# Patient Record
Sex: Male | Born: 1955 | Race: Black or African American | Hispanic: No | Smoking: Former smoker
Health system: Southern US, Community
[De-identification: ages and names within clinical notes are randomized; demographics above are authoritative.]

## PROBLEM LIST (undated history)

## (undated) DIAGNOSIS — R7611 Nonspecific reaction to tuberculin skin test without active tuberculosis: Secondary | ICD-10-CM

## (undated) DIAGNOSIS — R52 Pain, unspecified: Secondary | ICD-10-CM

## (undated) DIAGNOSIS — Z6833 Body mass index (BMI) 33.0-33.9, adult: Secondary | ICD-10-CM

## (undated) DIAGNOSIS — E119 Type 2 diabetes mellitus without complications: Secondary | ICD-10-CM

## (undated) DIAGNOSIS — G4730 Sleep apnea, unspecified: Secondary | ICD-10-CM

## (undated) DIAGNOSIS — M549 Dorsalgia, unspecified: Secondary | ICD-10-CM

## (undated) DIAGNOSIS — J189 Pneumonia, unspecified organism: Secondary | ICD-10-CM

## (undated) DIAGNOSIS — E785 Hyperlipidemia, unspecified: Secondary | ICD-10-CM

## (undated) DIAGNOSIS — R0989 Other specified symptoms and signs involving the circulatory and respiratory systems: Secondary | ICD-10-CM

## (undated) DIAGNOSIS — J309 Allergic rhinitis, unspecified: Secondary | ICD-10-CM

## (undated) DIAGNOSIS — I10 Essential (primary) hypertension: Secondary | ICD-10-CM

## (undated) DIAGNOSIS — F17200 Nicotine dependence, unspecified, uncomplicated: Secondary | ICD-10-CM

## (undated) DIAGNOSIS — K219 Gastro-esophageal reflux disease without esophagitis: Secondary | ICD-10-CM

## (undated) DIAGNOSIS — R0609 Other forms of dyspnea: Secondary | ICD-10-CM

## (undated) DIAGNOSIS — R799 Abnormal finding of blood chemistry, unspecified: Secondary | ICD-10-CM

## (undated) DIAGNOSIS — R7989 Other specified abnormal findings of blood chemistry: Secondary | ICD-10-CM

## (undated) DIAGNOSIS — R12 Heartburn: Secondary | ICD-10-CM

## (undated) DIAGNOSIS — R030 Elevated blood-pressure reading, without diagnosis of hypertension: Secondary | ICD-10-CM

## (undated) DIAGNOSIS — G4700 Insomnia, unspecified: Secondary | ICD-10-CM

## (undated) DIAGNOSIS — M25569 Pain in unspecified knee: Secondary | ICD-10-CM

## (undated) HISTORY — DX: Allergic rhinitis, unspecified: J30.9

## (undated) HISTORY — DX: Pain, unspecified: R52

## (undated) HISTORY — DX: Gastro-esophageal reflux disease without esophagitis: K21.9

## (undated) HISTORY — DX: Insomnia, unspecified: G47.00

## (undated) HISTORY — DX: Essential (primary) hypertension: I10

## (undated) HISTORY — DX: Nonspecific reaction to tuberculin skin test without active tuberculosis: R76.11

## (undated) HISTORY — DX: Elevated blood-pressure reading, without diagnosis of hypertension: R03.0

## (undated) HISTORY — DX: Other specified abnormal findings of blood chemistry: R79.89

## (undated) HISTORY — DX: Heartburn: R12

## (undated) HISTORY — DX: Nicotine dependence, unspecified, uncomplicated: F17.200

## (undated) HISTORY — DX: Body mass index (BMI) 33.0-33.9, adult: Z68.33

## (undated) HISTORY — DX: Dorsalgia, unspecified: M54.9

## (undated) HISTORY — DX: Abnormal finding of blood chemistry, unspecified: R79.9

## (undated) HISTORY — PX: ORIF FOREARM FRACTURE: SHX2124

## (undated) HISTORY — DX: Other forms of dyspnea: R06.09

## (undated) HISTORY — DX: Other forms of dyspnea: R09.89

## (undated) HISTORY — PX: OTHER SURGICAL HISTORY: SHX169

## (undated) HISTORY — DX: Pain in unspecified knee: M25.569

## (undated) HISTORY — DX: Hyperlipidemia, unspecified: E78.5

---

## 2012-07-26 ENCOUNTER — Encounter: Payer: Self-pay | Admitting: Gastroenterology

## 2012-07-30 ENCOUNTER — Ambulatory Visit: Payer: Self-pay | Admitting: Gastroenterology

## 2012-09-04 ENCOUNTER — Encounter: Payer: Self-pay | Admitting: Gastroenterology

## 2012-09-04 ENCOUNTER — Ambulatory Visit (INDEPENDENT_AMBULATORY_CARE_PROVIDER_SITE_OTHER): Payer: Medicare HMO | Admitting: Gastroenterology

## 2012-09-04 VITALS — BP 140/74 | HR 84 | Ht 67.0 in | Wt 213.0 lb

## 2012-09-04 DIAGNOSIS — K219 Gastro-esophageal reflux disease without esophagitis: Secondary | ICD-10-CM

## 2012-09-04 NOTE — Progress Notes (Signed)
HPI: This is a    very pleasant 57 year old man whom I am meeting for the first time today.  Has had GERD like troubles for about 10 years.  Wihtin the past 6 weeks he wakes with water brash, regurg.  Overall gaining wieight.  PIll associated dysphagia but no food associated dysphagia.    Takes protonix usually in evening, shortly after dinner meal.  Lays down for sleep at 11pm.  He snacks after dinner usually.    Tried alka seltzer and this has helped, usually takes it PM at bedtime.  Feels much better with alkaseltzer.  BMs twice a day.  Will have beer on weekends.  Smokes cigar's daily.  Rare caffeine drinker.  He had colonoscopy in [place] in 2008 or 2009, this was normal.  He does not recall who did it but he believes family member remembers the name of the hospital  Review of systems: Pertinent positive and negative review of systems were noted in the above HPI section. Complete review of systems was performed and was otherwise normal.    Past Medical History  Diagnosis Date  . Backache, unspecified   . Unspecified essential hypertension   . Esophageal reflux   . Pain in joint, lower leg   . Generalized pain   . Other and unspecified hyperlipidemia   . Tobacco use disorder   . Body mass index 33.0-33.9, adult   . Other nonspecific findings on examination of blood   . Other abnormal blood chemistry   . Other dyspnea and respiratory abnormality   . Allergic rhinitis, cause unspecified   . Elevated blood pressure reading without diagnosis of hypertension   . 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 Insomnia, unspecified   . Heartburn     Past Surgical History  Procedure Date  . Orif forearm fracture     right    Current Outpatient Prescriptions  Medication Sig Dispense Refill  . amLODipine (NORVASC) 10 MG tablet Take 10 mg by mouth daily.      [name] atorvastatin (LIPITOR) 20 MG tablet Take 20 mg by mouth daily.      . cyclobenzaprine  (FLEXERIL) 10 MG tablet Take 10 mg by mouth at bedtime.      [name] oxyCODONE-acetaminophen (PERCOCET) 7.5-325 MG per tablet Take 1 tablet by mouth every 8 (eight) hours as needed.      . pantoprazole (PROTONIX) 40 MG tablet Take 40 mg by mouth daily.      [name] zolpidem (AMBIEN) 10 MG tablet Take 10 mg by mouth at bedtime as needed.        Allergies as of 09/04/2012 - Review Complete 09/04/2012  Allergen Reaction Noted  . Tuberculin tests  09/04/2012    Family History  Problem Relation Age of Onset  . Diabetes Mother   . Hypertension Mother   . Cancer Paternal Uncle     History   Social History  . Marital Status: Legally Separated    Spouse Name: N/A    Number of Children: 1  . Years of Education: N/A   Occupational History  . Not on file.   Social History Main Topics  . Smoking status: Current Every Day Smoker -- 0.2 packs/day for 1 years    Types: Cigars  . Smokeless tobacco: Never Used  . Alcohol Use: Yes     Comment: 2 40oz beers a week  . Drug Use: No  . Sexually Active: Not on file   Other Topics Concern  .  Not on file   Social History Narrative  . No narrative on file       Physical Exam: BP 140/74  Pulse 84  Ht 5\' 7"  (1.702 m)  Wt 213 lb (96.616 kg)  BMI 33.36 kg/m2 Constitutional: generally well-appearing Psychiatric: alert and oriented x3 Eyes: extraocular movements intact Mouth: oral pharynx moist, no lesions Neck: supple no lymphadenopathy Cardiovascular: heart regular rate and rhythm Lungs: clear to auscultation bilaterally Abdomen: soft, nontender, nondistended, no obvious ascites, no peritoneal signs, normal bowel sounds Extremities: no lower extremity edema bilaterally Skin: no lesions on visible extremities    Assessment and plan: 57 y.o. male with  GERD, intermittent pill associated dysphasia, likely routine risk for colon cancer  I recommended he cut down late-night snacks. He will just the way he is taking his proton pump inhibitor and  he will add H2 blocker at bedtime. We'll proceed with EGD to evaluate his chronic GERD, his intermittent mild dysphasia. We will try to get records from his [place] colonoscopy and I will make a recommendation for timing of his next, routine screening colonoscopy.

## 2012-09-04 NOTE — Patient Instructions (Addendum)
We will track down records from [place] colonoscopy in 2008, 2009. You should change the way you are taking your antiacid medicine (protonix) so that you are taking it 20-30 minutes prior to a decent meal as that is the way the pill is designed to work most effectively. Try to cut down on late night snacking. Trial of OTC pepcid or zantac (or generic equivalent) at bedtime every night. You will be set up for an upper endoscopy (LEC, moderate sedation) for GERD, dysphagia.

## 2012-09-06 ENCOUNTER — Telehealth: Payer: Self-pay | Admitting: Gastroenterology

## 2012-09-06 NOTE — Telephone Encounter (Signed)
Colonoscopy Dr. Merilynn Finland, [place], 10/2011.  Done for screening.  Findings normal examination.  Recommended repeat colonoscopy in 10 years.  I agree with that.  Patty, He needs recall colonoscopy 10/2016. Can you call him to let him know we got those records. Thanks

## 2012-09-06 NOTE — Telephone Encounter (Signed)
Left message on machine to call back  

## 2012-09-07 ENCOUNTER — Ambulatory Visit (AMBULATORY_SURGERY_CENTER): Payer: Medicare HMO | Admitting: Gastroenterology

## 2012-09-07 ENCOUNTER — Encounter: Payer: Self-pay | Admitting: Gastroenterology

## 2012-09-07 VITALS — BP 160/97 | HR 71 | Temp 97.5°F | Resp 26 | Ht 67.0 in | Wt 213.0 lb

## 2012-09-07 DIAGNOSIS — K2210 Ulcer of esophagus without bleeding: Secondary | ICD-10-CM

## 2012-09-07 DIAGNOSIS — K219 Gastro-esophageal reflux disease without esophagitis: Secondary | ICD-10-CM

## 2012-09-07 MED ORDER — SODIUM CHLORIDE 0.9 % IV SOLN: 500.0000 mL | INTRAVENOUS | Status: DC

## 2012-09-07 NOTE — Progress Notes (Signed)
The pt tolerated the egd well. Maw  

## 2012-09-07 NOTE — Telephone Encounter (Signed)
Pt aware and recall added

## 2012-09-07 NOTE — Op Note (Signed)
[facility] [address] [place], [address]   ENDOSCOPY PROCEDURE REPORT  PATIENT: [name], [name]  MR#: [id number] BIRTHDATE: 1956-04-25 , 56  yrs. old GENDER: Male ENDOSCOPIST: Rachael Fee, MD REFERRED BY:  Tracey Harries, M.D. PROCEDURE DATE:  09/07/2012 PROCEDURE:  EGD, diagnostic ASA CLASS:     Class II INDICATIONS:  GERD, intermittent dysphagia. MEDICATIONS: Fentanyl 50 mcg IV, Versed 6 mg IV, and These medications were titrated to patient response per physician's verbal order TOPICAL ANESTHETIC: Cetacaine Spray  DESCRIPTION OF PROCEDURE: After the risks benefits and alternatives of the procedure were thoroughly explained, informed consent was obtained.  The LB [id number]-H180 [id number] endoscope was introduced through the mouth and advanced to the second portion of the duodenum. Without limitations.  The instrument was slowly withdrawn as the mucosa was fully examined.    There was 1-2cm tongues of erosive esophagitis.  There was no stricture or ring.  The examination was otherwise normal. Retroflexed views revealed no abnormalities.     The scope was then withdrawn from the patient and the procedure completed. COMPLICATIONS: There were no complications. ENDOSCOPIC IMPRESSION: Erosive, reflux related esophagitis  RECOMMENDATIONS: You should continue taking PPI once daily (20-30 min before a meal) and H2 blocker such as pepcid or zantac at bedtime.   eSigned:  Rachael Fee, MD 09/07/2012 1:45 PM

## 2012-09-07 NOTE — Patient Instructions (Addendum)
YOU HAD AN ENDOSCOPIC PROCEDURE TODAY AT THE [facility]: Refer to the procedure report that was given to you for any specific questions about what was found during the examination.  If the procedure report does not answer your questions, please call your gastroenterologist to clarify.  If you requested that your care partner not be given the details of your procedure findings, then the procedure report has been included in a sealed envelope for you to review at your convenience later.  YOU SHOULD EXPECT: Some feelings of bloating in the abdomen. Passage of more gas than usual.  Walking can help get rid of the air that was put into your GI tract during the procedure and reduce the bloating. If you had a lower endoscopy (such as a colonoscopy or flexible sigmoidoscopy) you may notice spotting of blood in your stool or on the toilet paper. If you underwent a bowel prep for your procedure, then you may not have a normal bowel movement for a few days.  DIET: Your first meal following the procedure should be a light meal and then it is ok to progress to your normal diet.  A half-sandwich or bowl of soup is an example of a good first meal.  Heavy or fried foods are harder to digest and may make you feel nauseous or bloated.  Likewise meals heavy in dairy and vegetables can cause extra gas to form and this can also increase the bloating.  Drink plenty of fluids but you should avoid alcoholic beverages for 24 hours.  ACTIVITY: Your care partner should take you home directly after the procedure.  You should plan to take it easy, moving slowly for the rest of the day.  You can resume normal activity the day after the procedure however you should NOT DRIVE or use heavy machinery for 24 hours (because of the sedation medicines used during the test).    SYMPTOMS TO REPORT IMMEDIATELY: A gastroenterologist can be reached at any hour.  During normal business hours, 8:30 AM to 5:00 PM Monday through Friday,  call [phone].  After hours and on weekends, please call the GI answering service at [phone] who will take a message and have the physician on call contact you.     Following upper endoscopy (EGD)  Vomiting of blood or coffee ground material  New chest pain or pain under the shoulder blades  Painful or persistently difficult swallowing  New shortness of breath  Fever of 100F or higher  Black, tarry-looking stools  FOLLOW UP: If any biopsies were taken you will be contacted by phone or by letter within the next 1-3 weeks.  Call your gastroenterologist if you have not heard about the biopsies in 3 weeks.  Our staff will call the home number listed on your records the next business day following your procedure to check on you and address any questions or concerns that you may have at that time regarding the information given to you following your procedure. This is a courtesy call and so if there is no answer at the home number and we have not heard from you through the emergency physician on call, we will assume that you have returned to your regular daily activities without incident.  SIGNATURES/CONFIDENTIALITY: You and/or your care partner have signed paperwork which will be entered into your electronic medical record.  These signatures attest to the fact that  that the information above on your After Visit Summary has been reviewed and is understood.  Full responsibility of the confidentiality of this discharge information lies with you and/or your care-partner   Continue anti-reflux medication 30 minutes before breakfast and pepcid or zantac one at bedtime

## 2012-09-07 NOTE — Progress Notes (Signed)
Patient did not experience any of the following events: a burn prior to discharge; a fall within the facility; wrong site/side/patient/procedure/implant event; or a hospital transfer or hospital admission upon discharge from the facility. (G8907) Patient did not have preoperative order for IV antibiotic SSI prophylaxis. (G8918)  

## 2012-09-10 ENCOUNTER — Telehealth: Payer: Self-pay | Admitting: *Deleted

## 2012-09-10 NOTE — Telephone Encounter (Signed)
  Follow up Call-  Call back number 09/07/2012  Post procedure Call Back phone  # [phone]  Permission to leave phone message Yes     Patient questions:  Do you have a fever, pain , or abdominal swelling? no Pain Score  0 *  Have you tolerated food without any problems? yes  Have you been able to return to your normal activities? yes  Do you have any questions about your discharge instructions: Diet   no Medications  no Follow up visit  no  Do you have questions or concerns about your Care? no  Actions: * If pain score is 4 or above: No action needed, pain <4.

## 2015-03-05 ENCOUNTER — Ambulatory Visit
Admission: RE | Admit: 2015-03-05 | Discharge: 2015-03-05 | Disposition: A | Discharge status: Home / Self Care | Payer: Medicare HMO | Source: Ambulatory Visit | Attending: Neurology | Admitting: Neurology

## 2015-03-05 ENCOUNTER — Other Ambulatory Visit: Payer: Self-pay | Admitting: Neurology

## 2015-03-05 DIAGNOSIS — Z01818 Encounter for other preprocedural examination: Secondary | ICD-10-CM

## 2016-09-05 ENCOUNTER — Encounter: Payer: Self-pay | Admitting: Gastroenterology

## 2017-04-18 ENCOUNTER — Encounter (HOSPITAL_COMMUNITY): Payer: Self-pay | Admitting: Emergency Medicine

## 2017-04-18 ENCOUNTER — Emergency Department (HOSPITAL_COMMUNITY): Payer: Medicare HMO

## 2017-04-18 ENCOUNTER — Emergency Department (HOSPITAL_COMMUNITY)
Admission: EM | Admit: 2017-04-18 | Discharge: 2017-04-18 | Disposition: A | Discharge status: Home / Self Care | Payer: Medicare HMO | Attending: Emergency Medicine | Admitting: Emergency Medicine

## 2017-04-18 DIAGNOSIS — Z79899 Other long term (current) drug therapy: Secondary | ICD-10-CM | POA: Diagnosis not present

## 2017-04-18 DIAGNOSIS — F17290 Nicotine dependence, other tobacco product, uncomplicated: Secondary | ICD-10-CM | POA: Diagnosis not present

## 2017-04-18 DIAGNOSIS — M25532 Pain in left wrist: Secondary | ICD-10-CM | POA: Diagnosis present

## 2017-04-18 DIAGNOSIS — I10 Essential (primary) hypertension: Secondary | ICD-10-CM | POA: Insufficient documentation

## 2017-04-18 DIAGNOSIS — M25832 Other specified joint disorders, left wrist: Secondary | ICD-10-CM

## 2017-04-18 MED ORDER — KETOROLAC TROMETHAMINE 60 MG/2ML IM SOLN
30.0000 mg | Freq: Once | INTRAMUSCULAR | Status: DC
  Filled 2017-04-18: qty 2

## 2017-04-18 MED ORDER — DICLOFENAC SODIUM 50 MG PO TBEC: 50.0000 mg | DELAYED_RELEASE_TABLET | Freq: Two times a day (BID) | ORAL | 0 refills | Status: DC

## 2017-04-18 NOTE — ED Triage Notes (Signed)
Pt c/o left hand pain, numbness, decreased ROM onset 2 days ago, was moving a car, heavy lifting 3 days ago. Improved when patient applies pressure to wrist. Worse laying down. Numbness reproducible with percussion to anterior wrist. Hx carpal tunnel release surgery a few years ago, states current pain feels similar to carpal tunnel syndrome.

## 2017-04-18 NOTE — Discharge Instructions (Signed)
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 When you were pushing against the car to try and move it you most likely irritated the area to cause the increased pain. The splint should help with the pain. Elevation and ice will also help. Take your pain medication at home in addition to what we give you. Follow up with Dr. Laurie Panda and he may want to refer you to a hand surgeon.

## 2017-04-18 NOTE — ED Provider Notes (Signed)
[facility] DEPT Provider Note   CSN: [id number] Arrival date & time: 04/18/17  6301     History   Chief Complaint Chief Complaint  Patient presents with  . Hand Pain    HPI [name] [name] [name] [name] is a 61 y.o. male who presents to the ED with hand pain. The pain is located in the left wrist. Patient reports that he was helping his brother push a car 3 days ago and after that the pain started. He reports taking Percocet that he has for his back pain but it did not help the wrist pain.  The history is provided by the patient. No language interpreter was used.  Hand Pain  This is a new problem. The current episode started 2 days ago. The problem occurs constantly. The problem has been gradually worsening. Pertinent negatives include no chest pain, no headaches and no shortness of breath.    Past Medical History:  Diagnosis Date  . Allergic rhinitis, cause unspecified   . Backache, unspecified   . Body mass index 33.0-33.9, adult   . Elevated blood pressure reading without diagnosis of hypertension   . Esophageal reflux   . Generalized pain   . Heartburn   . Insomnia, unspecified   . 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 Other abnormal blood chemistry   . Other and unspecified hyperlipidemia   . Other dyspnea and respiratory abnormality   . Other nonspecific findings on examination of blood(790.99)   . Pain in joint, lower leg   . Tobacco use disorder   . Unspecified essential hypertension     There are no active problems to display for this patient.   Past Surgical History:  Procedure Laterality Date  . bil wrist surgery    . ORIF FOREARM FRACTURE     right  . plastic surgery to face         Home Medications    Prior to Admission medications   Medication Sig Start Date End Date Taking? Authorizing Provider  amLODipine (NORVASC) 10 MG tablet Take 10 mg by mouth daily.    [provider]  atorvastatin (LIPITOR)  20 MG tablet Take 20 mg by mouth daily.    [provider]  cyclobenzaprine (FLEXERIL) 10 MG tablet Take 10 mg by mouth at bedtime.    [provider]  diclofenac (VOLTAREN) 50 MG EC tablet Take 1 tablet (50 mg total) by mouth 2 (two) times daily. 04/18/17   Ashley Murrain, NP  oxyCODONE-acetaminophen (PERCOCET) 7.5-325 MG per tablet Take 1 tablet by mouth every 8 (eight) hours as needed.    [provider]  pantoprazole (PROTONIX) 40 MG tablet Take 40 mg by mouth daily.    [provider]  zolpidem (AMBIEN) 10 MG tablet Take 10 mg by mouth at bedtime as needed.    [provider]    Family History Family History  Problem Relation Age of Onset  . Diabetes Mother   . Hypertension Mother   . Cancer Paternal Uncle     Social History Social History  Substance Use Topics  . Smoking status: Current Every Day Smoker    Packs/day: 0.20    Years: 1.00    Types: Cigars  . Smokeless tobacco: Never Used  . Alcohol use Yes     Comment: 2 40oz beers a week     Allergies   Tuberculin tests   Review of Systems Review of Systems  Constitutional: Negative for chills  and fever.  HENT: Negative.   Eyes: Negative for visual disturbance.  Respiratory: Negative for cough and shortness of breath.   Cardiovascular: Negative for chest pain.  Gastrointestinal: Negative for nausea and vomiting.  Musculoskeletal: Positive for arthralgias. Back pain: chronic low back pain.       Right wrist pain  Skin: Negative for wound.  Neurological: Negative for syncope and headaches.  Psychiatric/Behavioral: Negative for confusion. The patient is not nervous/anxious.      Physical Exam Updated Vital Signs BP (!) 142/84 (BP Location: Left Arm)   Pulse 80   Temp 98.6 F (37 C) (Oral)   Resp 18   SpO2 98%   Physical Exam  Constitutional: He is oriented to person, place, and time. He appears well-developed and well-nourished. No distress.  HENT:  Head:  Normocephalic and atraumatic.  Eyes: EOM are normal.  Neck: Neck supple.  Cardiovascular: Normal rate.   Pulmonary/Chest: Effort normal.  Abdominal: Soft. There is no tenderness.  Musculoskeletal:       Right wrist: He exhibits no swelling, no deformity and no laceration. Decreased range of motion: due to pain.  Neurological: He is alert and oriented to person, place, and time. No cranial nerve deficit.  Skin: Skin is warm and dry.  Psychiatric: He has a normal mood and affect. His behavior is normal.  Nursing note and vitals reviewed.    ED Treatments / Results  Labs (all labs ordered are listed, but only abnormal results are displayed) Labs Reviewed - No data to display  Radiology No results found.  Procedures Procedures (including critical care time)  Medications Ordered in ED Medications - No data to display   Initial Impression / Assessment and Plan / ED Course  I have reviewed the triage vital signs and the nursing notes.  Pertinent imaging results that were available during my care of the patient were reviewed by me and considered in my medical decision making (see chart for details). 61 y.o. male with left wrist pain stable for d/c without focal neuro deficits. Will treat with wrist splint and NSAIDS. Patient to f/u with his PCP. Return precautions discussed.   Final Clinical Impressions(s) / ED Diagnoses   Final diagnoses:  Ulnar impaction syndrome, left    New Prescriptions Discharge Medication List as of 04/18/2017  1:00 PM    START taking these medications   Details  diclofenac (VOLTAREN) 50 MG EC tablet Take 1 tablet (50 mg total) by mouth 2 (two) times daily., Starting Tue 04/18/2017, Print         [place], [place], NP 04/20/17 [place]    Quintella Reichert, MD 04/21/17 1208

## 2017-10-12 ENCOUNTER — Other Ambulatory Visit (HOSPITAL_COMMUNITY): Payer: Self-pay | Admitting: Family Medicine

## 2017-10-12 DIAGNOSIS — R590 Localized enlarged lymph nodes: Secondary | ICD-10-CM

## 2017-10-13 ENCOUNTER — Ambulatory Visit
Admission: RE | Admit: 2017-10-13 | Discharge: 2017-10-13 | Disposition: A | Discharge status: Home / Self Care | Payer: Self-pay | Source: Ambulatory Visit | Attending: Family Medicine | Admitting: Family Medicine

## 2017-10-13 ENCOUNTER — Other Ambulatory Visit (HOSPITAL_COMMUNITY): Payer: Self-pay | Admitting: Family Medicine

## 2017-10-13 DIAGNOSIS — R590 Localized enlarged lymph nodes: Secondary | ICD-10-CM

## 2017-10-17 ENCOUNTER — Other Ambulatory Visit (HOSPITAL_COMMUNITY): Payer: Self-pay | Admitting: Family Medicine

## 2017-10-17 DIAGNOSIS — R590 Localized enlarged lymph nodes: Secondary | ICD-10-CM

## 2017-10-25 ENCOUNTER — Other Ambulatory Visit: Payer: Self-pay | Admitting: Student

## 2017-10-26 ENCOUNTER — Other Ambulatory Visit (HOSPITAL_COMMUNITY): Payer: Self-pay | Admitting: Family Medicine

## 2017-10-26 ENCOUNTER — Ambulatory Visit (HOSPITAL_COMMUNITY)
Admission: RE | Admit: 2017-10-26 | Discharge: 2017-10-26 | Disposition: A | Discharge status: Home / Self Care | Payer: Medicare HMO | Source: Ambulatory Visit | Attending: Family Medicine | Admitting: Family Medicine

## 2017-10-26 ENCOUNTER — Encounter (HOSPITAL_COMMUNITY): Payer: Self-pay

## 2017-10-26 ENCOUNTER — Ambulatory Visit (HOSPITAL_COMMUNITY): Payer: Medicare HMO

## 2017-10-26 DIAGNOSIS — Z79899 Other long term (current) drug therapy: Secondary | ICD-10-CM | POA: Diagnosis not present

## 2017-10-26 DIAGNOSIS — F17210 Nicotine dependence, cigarettes, uncomplicated: Secondary | ICD-10-CM | POA: Insufficient documentation

## 2017-10-26 DIAGNOSIS — I10 Essential (primary) hypertension: Secondary | ICD-10-CM | POA: Insufficient documentation

## 2017-10-26 DIAGNOSIS — E785 Hyperlipidemia, unspecified: Secondary | ICD-10-CM | POA: Diagnosis not present

## 2017-10-26 DIAGNOSIS — R590 Localized enlarged lymph nodes: Secondary | ICD-10-CM

## 2017-10-26 DIAGNOSIS — K219 Gastro-esophageal reflux disease without esophagitis: Secondary | ICD-10-CM | POA: Insufficient documentation

## 2017-10-26 DIAGNOSIS — C8581 Other specified types of non-Hodgkin lymphoma, lymph nodes of head, face, and neck: Secondary | ICD-10-CM | POA: Diagnosis not present

## 2017-10-26 DIAGNOSIS — Z7982 Long term (current) use of aspirin: Secondary | ICD-10-CM | POA: Diagnosis not present

## 2017-10-26 DIAGNOSIS — Z7984 Long term (current) use of oral hypoglycemic drugs: Secondary | ICD-10-CM | POA: Diagnosis not present

## 2017-10-26 LAB — GLUCOSE, CAPILLARY: Glucose-Capillary: 113 mg/dL — ABNORMAL HIGH (ref 65–99)

## 2017-10-26 MED ORDER — LIDOCAINE HCL (PF) 1 % IJ SOLN
INTRAMUSCULAR | Status: AC
  Filled 2017-10-26: qty 10

## 2017-10-26 MED ORDER — LIDOCAINE HCL (PF) 1 % IJ SOLN
INTRAMUSCULAR | Status: AC
  Filled 2017-10-26: qty 30

## 2017-10-26 NOTE — Sedation Documentation (Signed)
Pt tolerated procedure very well without sedation.

## 2017-10-26 NOTE — Sedation Documentation (Signed)
Patient is resting comfortably.Vitals stable, biopsies taken.

## 2017-10-26 NOTE — Sedation Documentation (Signed)
Pt remains stable. Discharged home. Verbalizes understanding of discharge instructions.

## 2017-10-26 NOTE — Sedation Documentation (Signed)
Pt is resting comfortably in the nurses station awaiting transportation.

## 2017-10-26 NOTE — Sedation Documentation (Signed)
Pt is anxious

## 2017-10-26 NOTE — Consult Note (Signed)
Chief Complaint: Patient was seen in consultation today for cervical lymphadenopathy at the request of Bouska,David  Referring Physician(s): Bouska,David  History of Present Illness: [name] [name] is a 63 y.o. male cervical lymphadenopathy.  Patient palpated nodes in neck and outside cross sectional imaging that demonstrated enlarged lymph nodes.  Patient presents for lymph node biopsy.  No complaints today.    Past Medical History:  Diagnosis Date  . Allergic rhinitis, cause unspecified   . Backache, unspecified   . Body mass index 33.0-33.9, adult   . Elevated blood pressure reading without diagnosis of hypertension   . Esophageal reflux   . Generalized pain   . Heartburn   . Insomnia, unspecified   . 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 Other abnormal blood chemistry   . Other and unspecified hyperlipidemia   . Other dyspnea and respiratory abnormality   . Other nonspecific findings on examination of blood(790.99)   . Pain in joint, lower leg   . Tobacco use disorder   . Unspecified essential hypertension     Past Surgical History:  Procedure Laterality Date  . bil wrist surgery    . ORIF FOREARM FRACTURE     right  . plastic surgery to face      Allergies: Lisinopril and Tuberculin tests  Medications: Prior to Admission medications   Medication Sig Start Date End Date Taking? Authorizing Provider  amLODipine (NORVASC) 10 MG tablet Take 10 mg by mouth daily.   Yes [provider]  aspirin EC 81 MG tablet Take 81 mg by mouth daily.   Yes [provider]  atorvastatin (LIPITOR) 20 MG tablet Take 20 mg by mouth daily.   Yes [provider]  cyclobenzaprine (FLEXERIL) 10 MG tablet Take 10 mg by mouth at bedtime.   Yes [provider]  losartan (COZAAR) 50 MG tablet Take 50 mg by mouth daily.   Yes [provider]  meloxicam (MOBIC) 15 MG tablet Take 15 mg by mouth daily.    Yes [provider]  metFORMIN (GLUCOPHAGE) 500 MG tablet Take 500 mg by mouth daily.   Yes [provider]  oxyCODONE-acetaminophen (PERCOCET) 7.5-325 MG per tablet Take 1 tablet by mouth 2 (two) times daily as needed for severe pain.    Yes [provider]  pantoprazole (PROTONIX) 40 MG tablet Take 40 mg by mouth daily.   Yes [provider]  potassium chloride SA (K-DUR,KLOR-CON) 20 MEQ tablet Take 20 mEq by mouth daily.   Yes [provider]  zolpidem (AMBIEN) 10 MG tablet Take 10 mg by mouth at bedtime.    Yes [provider]     Family History  Problem Relation Age of Onset  . Diabetes Mother   . Hypertension Mother   . Cancer Paternal Uncle     Social History   Socioeconomic History  . Marital status: Legally Separated    Spouse name: Not on file  . Number of children: 1  . Years of education: Not on file  . Highest education level: Not on file  Social Needs  . Financial resource strain: Not on file  . Food insecurity - worry: Not on file  . Food insecurity - inability: Not on file  . Transportation needs - medical: Not on file  . Transportation needs - non-medical: Not on file  Occupational History  . Not on file  Tobacco Use  . Smoking status: Current Every Day Smoker  Packs/day: 0.20    Years: 1.00    Pack years: 0.20    Types: Cigars  . Smokeless tobacco: Never Used  Substance and Sexual Activity  . Alcohol use: Yes    Comment: 2 40oz beers a week  . Drug use: No  . Sexual activity: Not on file  Other Topics Concern  . Not on file  Social History Narrative  . Not on file     Review of Systems  Vital Signs: BP 140/89   Pulse 78   Temp 97.7 F (36.5 C)   Resp 12   Ht 5\' 7"  (1.702 m)   Wt 198 lb (89.8 kg)   SpO2 95% Comment: RA  BMI 31.01 kg/m   Physical Exam  Constitutional: He appears well-developed and well-nourished. No distress.  HENT:  Mouth/Throat: Oropharynx is clear and moist.    Cardiovascular: Normal rate, regular rhythm and normal heart sounds.  Pulmonary/Chest: Effort normal and breath sounds normal.  Abdominal: Soft.  Lymphadenopathy:    He has cervical adenopathy.    Imaging: No results found.  Labs:  CBC: No results for input(s): WBC, HGB, HCT, PLT in the last 8760 hours.  COAGS: No results for input(s): INR, APTT in the last 8760 hours.  BMP: No results for input(s): NA, K, CL, CO2, GLUCOSE, BUN, CALCIUM, CREATININE, GFRNONAA, GFRAA in the last 8760 hours.  Invalid input(s): CMP  LIVER FUNCTION TESTS: No results for input(s): BILITOT, AST, ALT, ALKPHOS, PROT, ALBUMIN in the last 8760 hours.  TUMOR MARKERS: No results for input(s): AFPTM, CEA, CA199, CHROMGRNA in the last 8760 hours.  Assessment and Plan:  62 yo with cervical lymphadenopathy and concern for neoplasm and lymphoma.  Discussed US guided biopsy with patient and he elected to do procedure without sedation.  Risks include but not limited to bleeding and infection.  Plan for US guided cervical lymph node biopsy.      Electronically Signed: Burman Riis, MD 10/26/2017, 3:45 PM

## 2017-10-26 NOTE — Sedation Documentation (Signed)
Pt does not request sedation for this procedure. Vitals stable MD at bedside

## 2017-10-26 NOTE — Sedation Documentation (Signed)
Biopsies taken at this time. Pt resting

## 2017-10-26 NOTE — Sedation Documentation (Signed)
Pt is resting with no complaints at this time, procedure started

## 2017-11-09 ENCOUNTER — Telehealth: Payer: Self-pay | Admitting: Hematology

## 2017-11-09 NOTE — Progress Notes (Signed)
HEMATOLOGY/ONCOLOGY CONSULTATION NOTE  Date of Service: 11/10/2017  Patient Care Team: Bernerd Limbo, MD as PCP - General (Family Medicine)  CHIEF COMPLAINTS/PURPOSE OF CONSULTATION:  Small B-Cell Lymphoma  HISTORY OF PRESENTING ILLNESS:   [name] [name] is a wonderful 62 y.o. 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 The pt reports that he is doing well overall.   The pt reports that in August 2018 he felt a knot on the left side of his neck, which he spoke about with his PCP Dr. Coletta Memos. He then had an MRI in January 2019, a CXR in February 2019 and then a biopsy on 10/26/17 (results noted below). He denies any constitutional symptoms as noted below. He notes that the swelling on his neck has not increased in size and is not painful.   He notes that he gets congested at night and is only able to breathe out of one nostril; he denies having a deviated septum. He notes that he has tried to have this worked up for the last 4 years without success and has tried saline sprays without success. He notes that he wakes up with dry mouth.   The pt takes Amlodipine, 81mg  Aspirin, Atorvastatin, Cyclobenzaprine, Ferrousul, Losartan Potassium, Meloxicam, Metformin 500mg , Percocet, Protonix, Potassium Chloride, and Ambein.   Of note prior to the patient's visit today, pt has had Lymph node, needle/core biopsy, Left Supraclavicular completed on 10/31/17 with results revealing SMALL LYMPHOCYTIC LYMPHOMA.  10/26/17 Tissue Flow Cytometry revealed a Monoclonal B Cell population identified.    08/28/17 MRI revealed Extensive cervical adenopathy. Adenopathy also noted in the subpectoral nodes.   CXR on 10/11/17 was revealed to be Normal.   His 10/11/17 CBC revealed all values WNL except for % Lymph at 66.3%, Mono % at 3.3% and % Gran at 30.4%, Lymphs Abs at 6.4k, Hgb at 12.5, HCT at 37.5, MCV at 75.5, MCH at 25.1, RDW at 16.3, MPV at 6.4  On  review of systems, pt reports left cervical lymph node swelling and denies fevers, chills, night sweats, unexpected weight loss, CP, SOB, abdominal pain or swelling, pain along the spine, changes in bowel habits, skin rashes, leg swelling, and any other symptoms.    On PMHx the pt reports Hyperlipidemia, Hypertensive reitonpathy, Hypopotassemia, DM Type 2, acid reflux, spinal stenosis.  On Social Hx the pt reports being a former smoker, having quit in 2012. He notes that he smoked about 5 cigars per week before quitting. He denies consuming much ETOH. He denies any recreational drug use. He denies any unsafe needle exposure.  On Family Hx the pt reports an uncle who had cancer but wasn't sure what kind.   MEDICAL HISTORY:  Past Medical History:  Diagnosis Date  . Allergic rhinitis, cause unspecified   . Backache, unspecified   . Body mass index 33.0-33.9, adult   . Elevated blood pressure reading without diagnosis of hypertension   . Esophageal reflux   . Generalized pain   . Heartburn   . Insomnia, unspecified   . 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 Other abnormal blood chemistry   . Other and unspecified hyperlipidemia   . Other dyspnea and respiratory abnormality   . Other nonspecific findings on examination of blood(790.99)   . Pain in joint, lower leg   . Tobacco use disorder   . Unspecified essential hypertension     SURGICAL HISTORY: Past  Surgical History:  Procedure Laterality Date  . bil wrist surgery    . ORIF FOREARM FRACTURE     right  . plastic surgery to face      SOCIAL HISTORY: Social History   Socioeconomic History  . Marital status: Legally Separated    Spouse name: Not on file  . Number of children: 1  . Years of education: Not on file  . Highest education level: Not on file  Occupational History  . Not on file  Social Needs  . Financial resource strain: Not on file  . Food insecurity:    Worry: Not  on file    Inability: Not on file  . Transportation needs:    Medical: Not on file    Non-medical: Not on file  Tobacco Use  . Smoking status: Current Every Day Smoker    Packs/day: 0.20    Years: 1.00    Pack years: 0.20    Types: Cigars  . Smokeless tobacco: Never Used  Substance and Sexual Activity  . Alcohol use: Yes    Comment: 2 40oz beers a week  . Drug use: No  . Sexual activity: Not on file  Lifestyle  . Physical activity:    Days per week: Not on file    Minutes per session: Not on file  . Stress: Not on file  Relationships  . Social connections:    Talks on phone: Not on file    Gets together: Not on file    Attends religious service: Not on file    Active member of club or organization: Not on file    Attends meetings of clubs or organizations: Not on file    Relationship status: Not on file  . Intimate partner violence:    Fear of current or ex partner: Not on file    Emotionally abused: Not on file    Physically abused: Not on file    Forced sexual activity: Not on file  Other Topics Concern  . Not on file  Social History Narrative  . Not on file    FAMILY HISTORY: Family History  Problem Relation Age of Onset  . Diabetes Mother   . Hypertension Mother   . Cancer Paternal Uncle     ALLERGIES:  is allergic to lisinopril and tuberculin tests.  MEDICATIONS:  Current Outpatient Medications  Medication Sig Dispense Refill  . amLODipine (NORVASC) 10 MG tablet Take 10 mg by mouth daily.    [name] aspirin EC 81 MG tablet Take 81 mg by mouth daily.    [name] atorvastatin (LIPITOR) 20 MG tablet Take 20 mg by mouth daily.    . cyclobenzaprine (FLEXERIL) 10 MG tablet Take 10 mg by mouth at bedtime.    [name] losartan (COZAAR) 50 MG tablet Take 50 mg by mouth daily.    . meloxicam (MOBIC) 15 MG tablet Take 15 mg by mouth daily.    . metFORMIN (GLUCOPHAGE) 500 MG tablet Take 500 mg by mouth daily.    [name] oxyCODONE-acetaminophen (PERCOCET) 7.5-325 MG per tablet Take 1  tablet by mouth 2 (two) times daily as needed for severe pain.     . pantoprazole (PROTONIX) 40 MG tablet Take 40 mg by mouth daily.    . potassium chloride SA (K-DUR,KLOR-CON) 20 MEQ tablet Take 20 mEq by mouth daily.    [name] zolpidem (AMBIEN) 10 MG tablet Take 10 mg by mouth at bedtime.      No current facility-administered medications for this visit.  REVIEW OF SYSTEMS:    10 Point review of Systems was done is negative except as noted above.  PHYSICAL EXAMINATION: ECOG PERFORMANCE STATUS: 1 - Symptomatic but completely ambulatory  . Vitals:   11/10/17 0854  BP: 129/82  Pulse: 74  Resp: 18  Temp: 98.8 F (37.1 C)  SpO2: 99%   Filed Weights   11/10/17 0854  Weight: 202 lb 1.6 oz (91.7 kg)   .Body mass index is 31.65 kg/m.  GENERAL:alert, in no acute distress and comfortable SKIN: no acute rashes, no significant lesions EYES: conjunctiva are pink and non-injected, sclera anicteric OROPHARYNX: MMM, no exudates, no oropharyngeal erythema or ulceration NECK: supple, no JVD LYMPH:  no palpable lymphadenopathy in the axillary or inguinal regions, small palpable cervical Lnadenopathy noted. LUNGS: clear to auscultation b/l with normal respiratory effort HEART: regular rate & rhythm ABDOMEN:  normoactive bowel sounds , non tender, not distended. No palpable hepato-splenomegaly Extremity: no pedal edema PSYCH: alert & oriented x 3 with fluent speech NEURO: no focal motor/sensory deficits  LABORATORY DATA:  I have reviewed the data as listed  . CBC Latest Ref Rng & Units 11/10/2017  WBC 4.0 - 10.3 K/uL 10.8(H)  Hematocrit 38.4 - 49.9 % 35.0(L)  Platelets 140 - 400 K/uL 228  HGB 11.9    . CMP Latest Ref Rng & Units 11/10/2017  Glucose 70 - 140 mg/dL 115  BUN 7 - 26 mg/dL 13  Creatinine 0.70 - 1.30 mg/dL 1.30  Sodium 136 - 145 mmol/L 139  Potassium 3.5 - 5.1 mmol/L 4.1  Chloride 98 - 109 mmol/L 107  CO2 22 - 29 mmol/L 23  Calcium 8.4 - 10.4 mg/dL 9.7  Total  Protein 6.4 - 8.3 g/dL 7.7  Total Bilirubin 0.2 - 1.2 mg/dL 1.0  Alkaline Phos 40 - 150 U/L 108  AST 5 - 34 U/L 10  ALT 0 - 55 U/L 8   Component     Latest Ref Rng & Units 11/10/2017  Retic Ct Pct     0.8 - 1.8 % 1.3  RBC.     4.20 - 5.82 MIL/uL 4.75  Retic Count, Absolute     34.8 - 93.9 K/uL 61.8  LDH     125 - 245 U/L 152  HCV Ab     0.0 - 0.9 s/co ratio <0.1  HIV Screen 4th Generation wRfx     Non Reactive Non Reactive  Hepatitis B Surface Ag     Negative Negative  Hep B Core Ab, Tot     Negative Negative    10/31/17 Tissue Flow Cytometry:   10/31/17 Lymph Node Needle/Core Biopsy:    RADIOGRAPHIC STUDIES: I have personally reviewed the radiological images as listed and agreed with the findings in the report.  08/28/17 MRI     US Soft Tissue Head & Neck (non-thyroid)  Result Date: 10/26/2017 CLINICAL DATA:  Evaluate cervical lymphadenopathy prior to biopsy. EXAM: ULTRASOUND OF HEAD/NECK SOFT TISSUES TECHNIQUE: Ultrasound examination of the head and neck soft tissues was performed in the area of clinical concern. COMPARISON:  None. FINDINGS: Multiple enlarged hypoechoic lymph nodes along the left side of the neck. These lymph nodes are markedly enlarged for size. Index lymph node measures 1.7 cm in the short axis. IMPRESSION: Multiple enlarged left cervical lymph nodes. Electronically Signed   By: Markus Daft M.D.   On: 10/26/2017 17:57   [place] Core Biopsy (lymph Nodes)  Result Date: 10/26/2017 INDICATION: 62 year old with cervical lymphadenopathy. Patient needs a tissue  diagnosis. EXAM: ULTRASOUND-GUIDED LEFT CERVICAL LYMPH NODE BIOPSY MEDICATIONS: None. ANESTHESIA/SEDATION: None FLUOROSCOPY TIME:  None COMPLICATIONS: None immediate. PROCEDURE: Informed written consent was obtained from the patient after a thorough discussion of the procedural risks, benefits and alternatives. All questions were addressed. A timeout was performed prior to the initiation of the procedure. Left  side of the neck was evaluated with ultrasound. Multiple enlarged lymph nodes were identified. The neck was prepped with chlorhexidine and a sterile field was created. Skin and soft tissues were anesthetized with 1% lidocaine. Using ultrasound guidance, a total of 6 core biopsies were obtained within enlarged lymph nodes. Two adjacent lymph nodes were sampled. The smaller lymph node had 1 core biopsy. The larger lymph node had 5 core biopsies. Specimens were placed in saline. Bandage placed over the puncture site. Ultrasound images were taken and saved for this procedure. FINDINGS: Multiple enlarged hypoechoic lymph nodes in the left supraclavicular region. No significant bleeding or hematoma formation following core biopsies. IMPRESSION: Ultrasound-guided left cervical lymph node core biopsies. Electronically Signed   By: Markus Daft M.D.   On: 10/26/2017 17:54   Mr Outside Films Soft Tissue Neck  Result Date: 10/30/2017 This examination belongs to an outside facility and is stored here for comparison purposes only.  Contact the originating outside institution for any associated report or interpretation.   ASSESSMENT & PLAN:   62 y.o. male with  1. 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 RBCs and Platelets were normal in number.  -Discussed his 10/26/17 biopsy which revealed a Small B-Cell Non-Hodgkin's Lymphocytic Lymphoma.  -Discussed that we would want to characterize this finding completely with a PET/CT scan.  -Discussed that he might have CLL/SLL as well based on lymphocytosis detected in his blood. We will work this up further with peripheral blood flow tests.  -Discussed the constitutional symptoms that the pt should look out for incuding fevers, chills, drenching night sweats, significant fatigue, unexpected and significant weight loss. He notes none of these currently -We  would like to perform genetic testing to understand what kind of mutation may be present. (CLLFISH panel) -Discussed that after the initial work up and testing if no indication for treatment then we would like to see pt routinely every 2-3 months. -Provided supplemental information to the pt regardign CLL/SLL, and asked him to let us know if he develops any new or concerning symptoms.  -Just palpable bilateral cervical lymph nodes were noted. No palpable axillary lymph nodes. Borderline splenomegaly.    Labs today PET/CT in 1 week RTC with Dr Irene Limbo in 2 weeks   All of the patients questions were answered with apparent satisfaction. The patient knows to call the clinic with any problems, questions or concerns.  I spent 45 minutes counseling the patient face to face. The total time spent in the appointment was 60 minutes and more than 50% was on counseling and direct patient cares.    Sullivan Lone MD [place] AAHIVMS [facility] Hematology/Oncology Physician [facility]  (Office):       [phone] (Work cell):  [phone] (Fax):           [phone]  11/10/2017 9:01 AM  This document serves as a record of services personally performed by Sullivan Lone, MD. It was created on his behalf by Baldwin Jamaica, a trained medical scribe. The creation of this record is based on the scribe's  personal observations and the provider's statements to them.   .I have reviewed the above documentation for accuracy and completeness, and I agree with the above. Brunetta Genera MD MS

## 2017-11-09 NOTE — Telephone Encounter (Signed)
Appt has been scheduled for the pt to see Dr. Irene Limbo on 3/22 at 9am. Pt agreed to the appt date and time. Location given to the pt.

## 2017-11-10 ENCOUNTER — Inpatient Hospital Stay: Payer: Medicare HMO

## 2017-11-10 ENCOUNTER — Encounter: Payer: Self-pay | Admitting: Hematology

## 2017-11-10 ENCOUNTER — Inpatient Hospital Stay: Payer: Medicare HMO | Attending: Hematology | Admitting: Hematology

## 2017-11-10 VITALS — BP 129/82 | HR 74 | Temp 98.8°F | Resp 18 | Ht 67.0 in | Wt 202.1 lb

## 2017-11-10 DIAGNOSIS — Z79899 Other long term (current) drug therapy: Secondary | ICD-10-CM | POA: Insufficient documentation

## 2017-11-10 DIAGNOSIS — E119 Type 2 diabetes mellitus without complications: Secondary | ICD-10-CM | POA: Diagnosis not present

## 2017-11-10 DIAGNOSIS — C8308 Small cell B-cell lymphoma, lymph nodes of multiple sites: Secondary | ICD-10-CM

## 2017-11-10 DIAGNOSIS — E785 Hyperlipidemia, unspecified: Secondary | ICD-10-CM | POA: Insufficient documentation

## 2017-11-10 DIAGNOSIS — K219 Gastro-esophageal reflux disease without esophagitis: Secondary | ICD-10-CM | POA: Insufficient documentation

## 2017-11-10 DIAGNOSIS — I10 Essential (primary) hypertension: Secondary | ICD-10-CM | POA: Insufficient documentation

## 2017-11-10 DIAGNOSIS — C9110 Chronic lymphocytic leukemia of B-cell type not having achieved remission: Secondary | ICD-10-CM

## 2017-11-10 DIAGNOSIS — C8300 Small cell B-cell lymphoma, unspecified site: Secondary | ICD-10-CM | POA: Diagnosis not present

## 2017-11-10 DIAGNOSIS — E876 Hypokalemia: Secondary | ICD-10-CM | POA: Insufficient documentation

## 2017-11-10 DIAGNOSIS — M4800 Spinal stenosis, site unspecified: Secondary | ICD-10-CM | POA: Diagnosis not present

## 2017-11-10 DIAGNOSIS — Z7982 Long term (current) use of aspirin: Secondary | ICD-10-CM | POA: Insufficient documentation

## 2017-11-10 DIAGNOSIS — Z7984 Long term (current) use of oral hypoglycemic drugs: Secondary | ICD-10-CM | POA: Diagnosis not present

## 2017-11-10 LAB — CMP (CANCER CENTER ONLY)
ALT: 8 U/L (ref 0–55)
AST: 10 U/L (ref 5–34)
Albumin: 4.2 g/dL (ref 3.5–5.0)
Alkaline Phosphatase: 108 U/L (ref 40–150)
Anion gap: 9 (ref 3–11)
BUN: 13 mg/dL (ref 7–26)
CO2: 23 mmol/L (ref 22–29)
CREATININE: 1.3 mg/dL (ref 0.70–1.30)
Calcium: 9.7 mg/dL (ref 8.4–10.4)
Chloride: 107 mmol/L (ref 98–109)
GFR, EST NON AFRICAN AMERICAN: 58 mL/min — AB (ref >60)
Glucose, Bld: 115 mg/dL (ref 70–140)
POTASSIUM: 4.1 mmol/L (ref 3.5–5.1)
Sodium: 139 mmol/L (ref 136–145)
Total Bilirubin: 1 mg/dL (ref 0.2–1.2)
Total Protein: 7.7 g/dL (ref 6.4–8.3)

## 2017-11-10 LAB — CBC WITH DIFFERENTIAL (CANCER CENTER ONLY)
BASOS ABS: 0 10*3/uL (ref 0.0–0.1)
Basophils Relative: 0 %
EOS ABS: 0.1 10*3/uL (ref 0.0–0.5)
EOS PCT: 1 %
HCT: 35 % — ABNORMAL LOW (ref 38.4–49.9)
HEMOGLOBIN: 11.9 g/dL — AB (ref 13.0–17.1)
LYMPHS PCT: 74 %
Lymphs Abs: 8 10*3/uL — ABNORMAL HIGH (ref 0.9–3.3)
MCH: 25.1 pg — ABNORMAL LOW (ref 27.2–33.4)
MCHC: 34 g/dL (ref 32.0–36.0)
MCV: 73.7 fL — AB (ref 79.3–98.0)
Monocytes Absolute: 0.2 10*3/uL (ref 0.1–0.9)
Monocytes Relative: 1 %
NEUTROS PCT: 24 %
Neutro Abs: 2.6 10*3/uL (ref 1.5–6.5)
PLATELETS: 228 10*3/uL (ref 140–400)
RBC: 4.75 MIL/uL (ref 4.20–5.82)
RDW: 14.8 % — ABNORMAL HIGH (ref 11.0–14.6)
WBC: 10.8 10*3/uL — AB (ref 4.0–10.3)

## 2017-11-10 LAB — RETICULOCYTES
RBC.: 4.75 MIL/uL (ref 4.20–5.82)
RETIC COUNT ABSOLUTE: 61.8 10*3/uL (ref 34.8–93.9)
Retic Ct Pct: 1.3 % (ref 0.8–1.8)

## 2017-11-10 LAB — LACTATE DEHYDROGENASE: LDH: 152 U/L (ref 125–245)

## 2017-11-10 NOTE — Patient Instructions (Signed)
Thank you for choosing [facility] to provide your oncology and hematology care.  To afford each patient quality time with our providers, please arrive 30 minutes before your scheduled appointment time.  If you arrive late for your appointment, you may be asked to reschedule.  We strive to give you quality time with our providers, and arriving late affects you and other patients whose appointments are after yours.   If you are a no show for multiple scheduled visits, you may be dismissed from the clinic at the providers discretion.    Again, thank you for choosing [facility], our hope is that these requests will decrease the amount of time that you wait before being seen by our physicians.  ______________________________________________________________________  Should you have questions after your visit to the  Cancer Center, please contact our office at [phone] between the hours of 8:30 and 4:30 p.m.    Voicemails left after 4:30p.m will not be returned until the following business day.    For prescription refill requests, please have your pharmacy contact us directly.  Please also try to allow 48 hours for prescription requests.    Please contact the scheduling department for questions regarding scheduling.  For scheduling of procedures such as PET scans, CT scans, MRI, Ultrasound, etc please contact central scheduling at [phone].    Resources For Cancer Patients and Caregivers:   Oncolink.org:  A wonderful resource for patients and healthcare providers for information regarding your disease, ways to tract your treatment, what to expect, etc.     American Cancer Society:  800-227-2345  Can help patients locate various types of support and financial assistance  Cancer Care: 1-800-813-HOPE (4673) Provides financial assistance, online support groups, medication/co-pay assistance.    [place] DSS:  336-641-3447 Where to apply for food  stamps, Medicaid, and utility assistance  Medicare Rights Center: 800-333-4114 Helps people with Medicare understand their rights and benefits, navigate the Medicare system, and secure the quality healthcare they deserve  SCAT: 336-333-6589 [place] [employer]'s shared-ride transportation service for eligible riders who have a disability that prevents them from riding the fixed route bus.    For additional information on assistance programs please contact our social worker:   Grier Hock/Abigail Elmore:  [phone]            

## 2017-11-11 LAB — HEPATITIS B CORE ANTIBODY, TOTAL: Hep B Core Total Ab: NEGATIVE

## 2017-11-11 LAB — HEPATITIS B SURFACE ANTIGEN: Hepatitis B Surface Ag: NEGATIVE

## 2017-11-11 LAB — HEPATITIS C ANTIBODY

## 2017-11-11 LAB — HIV ANTIBODY (ROUTINE TESTING W REFLEX): HIV SCREEN 4TH GENERATION: NONREACTIVE

## 2017-11-14 LAB — FLOW CYTOMETRY

## 2017-11-22 ENCOUNTER — Ambulatory Visit (HOSPITAL_COMMUNITY)
Admission: RE | Admit: 2017-11-22 | Discharge: 2017-11-22 | Disposition: A | Discharge status: Home / Self Care | Payer: Medicare HMO | Source: Ambulatory Visit | Attending: Hematology | Admitting: Hematology

## 2017-11-22 DIAGNOSIS — C9190 Lymphoid leukemia, unspecified not having achieved remission: Secondary | ICD-10-CM | POA: Insufficient documentation

## 2017-11-22 DIAGNOSIS — C8308 Small cell B-cell lymphoma, lymph nodes of multiple sites: Secondary | ICD-10-CM

## 2017-11-22 DIAGNOSIS — C9110 Chronic lymphocytic leukemia of B-cell type not having achieved remission: Secondary | ICD-10-CM

## 2017-11-22 DIAGNOSIS — K449 Diaphragmatic hernia without obstruction or gangrene: Secondary | ICD-10-CM | POA: Insufficient documentation

## 2017-11-22 DIAGNOSIS — R591 Generalized enlarged lymph nodes: Secondary | ICD-10-CM | POA: Insufficient documentation

## 2017-11-22 DIAGNOSIS — N281 Cyst of kidney, acquired: Secondary | ICD-10-CM | POA: Diagnosis not present

## 2017-11-22 LAB — GLUCOSE, CAPILLARY: Glucose-Capillary: 107 mg/dL — ABNORMAL HIGH (ref 65–99)

## 2017-11-22 MED ORDER — FLUDEOXYGLUCOSE F - 18 (FDG) INJECTION
10.0000 | Freq: Once | INTRAVENOUS | Status: AC | PRN
  Administered 2017-11-22: 10 via INTRAVENOUS

## 2017-11-22 NOTE — Progress Notes (Signed)
HEMATOLOGY/ONCOLOGY CLINIC NOTE  Date of Service: 11/23/17  Patient Care Team: Bernerd Limbo, MD as PCP - General (Family Medicine)  CHIEF COMPLAINTS/PURPOSE OF CONSULTATION:  Small B-Cell Lymphoma  HISTORY OF PRESENTING ILLNESS:   [name] [name] is a wonderful 62 y.o. 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 The pt reports that he is doing well overall.   The pt reports that in August 2018 he felt a knot on the left side of his neck, which he spoke about with his PCP Dr. Coletta Memos. He then had an MRI in January 2019, a CXR in February 2019 and then a biopsy on 10/26/17 (results noted below). He denies any constitutional symptoms as noted below. He notes that the swelling on his neck has not increased in size and is not painful.   He notes that he gets congested at night and is only able to breathe out of one nostril; he denies having a deviated septum. He notes that he has tried to have this worked up for the last 4 years without success and has tried saline sprays without success. He notes that he wakes up with dry mouth.   The pt takes Amlodipine, 81mg  Aspirin, Atorvastatin, Cyclobenzaprine, Ferrousul, Losartan Potassium, Meloxicam, Metformin 500mg , Percocet, Protonix, Potassium Chloride, and Ambein.   Of note prior to the patient's visit today, pt has had Lymph node, needle/core biopsy, Left Supraclavicular completed on 10/31/17 with results revealing SMALL LYMPHOCYTIC LYMPHOMA.  10/26/17 Tissue Flow Cytometry revealed a Monoclonal B Cell population identified.    08/28/17 MRI revealed Extensive cervical adenopathy. Adenopathy also noted in the subpectoral nodes.   CXR on 10/11/17 was revealed to be Normal.   His 10/11/17 CBC revealed all values WNL except for % Lymph at 66.3%, Mono % at 3.3% and % Gran at 30.4%, Lymphs Abs at 6.4k, Hgb at 12.5, HCT at 37.5, MCV at 75.5, MCH at 25.1, RDW at 16.3, MPV at 6.4  On review of  systems, pt reports left cervical lymph node swelling and denies fevers, chills, night sweats, unexpected weight loss, CP, SOB, abdominal pain or swelling, pain along the spine, changes in bowel habits, skin rashes, leg swelling, and any other symptoms.    On PMHx the pt reports Hyperlipidemia, Hypertensive reitonpathy, Hypopotassemia, DM Type 2, acid reflux, spinal stenosis.  On Social Hx the pt reports being a former smoker, having quit in 2012. He notes that he smoked about 5 cigars per week before quitting. He denies consuming much ETOH. He denies any recreational drug use. He denies any unsafe needle exposure.  On Family Hx the pt reports an uncle who had cancer but wasn't sure what kind.  Interval History:  [name] [name] returns today regarding his Small B Cell Lymphoma. The patient's last visit with [place] was on 11/10/17. The pt reports that he is doing well overall.   The pt reports that he has no new concerns. He also notes that he will be seeing his PCP Dr. Bernerd Limbo tomorrow.   On 11/10/17 the pt had a Peripheral Blood Flow Cytometry which revealed MONOCLONAL B-CELL POPULATION IDENTIFIED. Based on the overall findings, the differential diagnosis includes chronic lymphocytic leukemia  Of note since the patient's last visit, pt has had PET/CT completed on 11/22/17 with results revealing 1. Relatively bulky lymphadenopathy in the neck, chest, abdomen, and pelvis demonstrates low level hypermetabolism, consistent with known neoplasm. 2. Tiny hiatal hernia. 3. 12  mm right renal cyst.  Lab results today (11/10/17) of CBC, CMP, and Reticulocytes is as follows: all values are WNL except for WBC at 10.8k, Hgb at 11.9, HCT at 35.0, MCV at 73.7, MCH at 25.1, RDW at 14.8, Lymphs Abs at 8.0k. Hepatitis B antigen 11/10/17 is negative. LDH 11/10/17 is WNL at 152. Hepatitis C antibody 11/10/17 was WNL at <0.1.  HIV and Hep B core antibody 11/10/17 were both negative.   On review of systems, pt reports  good energy levels, and denies axillary or cervical discomfort, urinary troubles, bowel problems, night sweats, fevers, chills, weight loss, abdominal pain, leg swelling, and any other symptoms.   MEDICAL HISTORY:  Past Medical History:  Diagnosis Date  . Allergic rhinitis, cause unspecified   . Backache, unspecified   . Body mass index 33.0-33.9, adult   . Elevated blood pressure reading without diagnosis of hypertension   . Esophageal reflux   . Generalized pain   . Heartburn   . Insomnia, unspecified   . 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 Other abnormal blood chemistry   . Other and unspecified hyperlipidemia   . Other dyspnea and respiratory abnormality   . Other nonspecific findings on examination of blood(790.99)   . Pain in joint, lower leg   . Tobacco use disorder   . Unspecified essential hypertension     SURGICAL HISTORY: Past Surgical History:  Procedure Laterality Date  . bil wrist surgery    . ORIF FOREARM FRACTURE     right  . plastic surgery to face      SOCIAL HISTORY: Social History   Socioeconomic History  . Marital status: Legally Separated    Spouse name: Not on file  . Number of children: 1  . Years of education: Not on file  . Highest education level: Not on file  Occupational History  . Not on file  Social Needs  . Financial resource strain: Not on file  . Food insecurity:    Worry: Not on file    Inability: Not on file  . Transportation needs:    Medical: Not on file    Non-medical: Not on file  Tobacco Use  . Smoking status: Former Smoker    Packs/day: 0.20    Years: 0.00    Pack years: 0.00    Types: Cigars  . Smokeless tobacco: Never Used  Substance and Sexual Activity  . Alcohol use: Yes    Comment: 2 40oz beers a week  . Drug use: No  . Sexual activity: Not on file  Lifestyle  . Physical activity:    Days per week: Not on file    Minutes per session: Not on file  .  Stress: Not on file  Relationships  . Social connections:    Talks on phone: Not on file    Gets together: Not on file    Attends religious service: Not on file    Active member of club or organization: Not on file    Attends meetings of clubs or organizations: Not on file    Relationship status: Not on file  . Intimate partner violence:    Fear of current or ex partner: Not on file    Emotionally abused: Not on file    Physically abused: Not on file    Forced sexual activity: Not on file  Other Topics Concern  . Not on file  Social History Narrative  . Not on file  FAMILY HISTORY: Family History  Problem Relation Age of Onset  . Diabetes Mother   . Hypertension Mother   . Cancer Paternal Uncle     ALLERGIES:  is allergic to lisinopril and tuberculin tests.  MEDICATIONS:  Current Outpatient Medications  Medication Sig Dispense Refill  . amLODipine (NORVASC) 10 MG tablet Take 10 mg by mouth daily.    [name] aspirin EC 81 MG tablet Take 81 mg by mouth daily.    [name] atorvastatin (LIPITOR) 20 MG tablet Take 20 mg by mouth daily.    . cyclobenzaprine (FLEXERIL) 10 MG tablet Take 10 mg by mouth at bedtime.    [name] losartan (COZAAR) 50 MG tablet Take 50 mg by mouth daily.    . meloxicam (MOBIC) 15 MG tablet Take 15 mg by mouth daily.    . metFORMIN (GLUCOPHAGE) 500 MG tablet Take 500 mg by mouth daily.    [name] oxyCODONE-acetaminophen (PERCOCET) 7.5-325 MG per tablet Take 1 tablet by mouth 2 (two) times daily as needed for severe pain.     . pantoprazole (PROTONIX) 40 MG tablet Take 40 mg by mouth daily.    . potassium chloride SA (K-DUR,KLOR-CON) 20 MEQ tablet Take 20 mEq by mouth daily.    [name] zolpidem (AMBIEN) 10 MG tablet Take 10 mg by mouth at bedtime.      No current facility-administered medications for this visit.     REVIEW OF SYSTEMS:    10 Point review of Systems was done is negative except as noted above.  PHYSICAL EXAMINATION: ECOG PERFORMANCE STATUS: 1 - Symptomatic but  completely ambulatory  . Vitals:   11/23/17 1533  BP: 136/68  Pulse: 71  Resp: 20  Temp: 98.4 F (36.9 C)  SpO2: 98%   Filed Weights   11/23/17 1533  Weight: 204 lb 8 oz (92.8 kg)   .Body mass index is 32.03 kg/m.  GENERAL:alert, in no acute distress and comfortable SKIN: no acute rashes, no significant lesions EYES: conjunctiva are pink and non-injected, sclera anicteric OROPHARYNX: MMM, no exudates, no oropharyngeal erythema or ulceration NECK: supple, no JVD LYMPH:  no palpable lymphadenopathy in the axillary or inguinal regions, small palpable cervical LNadenopathy noted.  LUNGS: clear to auscultation b/l with normal respiratory effort HEART: regular rate & rhythm ABDOMEN:  normoactive bowel sounds , non tender, not distended. No palpable hepatosmegaly , borderline palpable splenomegaly Extremity: no pedal edema PSYCH: alert & oriented x 3 with fluent speech NEURO: no focal motor/sensory deficits     LABORATORY DATA:  I have reviewed the data as listed  . CBC Latest Ref Rng & Units 11/10/2017  WBC 4.0 - 10.3 K/uL 10.8(H)  Hematocrit 38.4 - 49.9 % 35.0(L)  Platelets 140 - 400 K/uL 228   . CMP Latest Ref Rng & Units 11/10/2017  Glucose 70 - 140 mg/dL 115  BUN 7 - 26 mg/dL 13  Creatinine 0.70 - 1.30 mg/dL 1.30  Sodium 136 - 145 mmol/L 139  Potassium 3.5 - 5.1 mmol/L 4.1  Chloride 98 - 109 mmol/L 107  CO2 22 - 29 mmol/L 23  Calcium 8.4 - 10.4 mg/dL 9.7  Total Protein 6.4 - 8.3 g/dL 7.7  Total Bilirubin 0.2 - 1.2 mg/dL 1.0  Alkaline Phos 40 - 150 U/L 108  AST 5 - 34 U/L 10  ALT 0 - 55 U/L 8   Component     Latest Ref Rng & Units 11/10/2017  Retic Ct Pct     0.8 - 1.8 % 1.3  RBC.     4.20 - 5.82 MIL/uL 4.75  Retic Count, Absolute     34.8 - 93.9 K/uL 61.8  LDH     125 - 245 U/L 152  HCV Ab     0.0 - 0.9 s/co ratio <0.1  HIV Screen 4th Generation wRfx     Non Reactive Non Reactive  Hepatitis B Surface Ag     Negative Negative  Hep B Core Ab, Tot      Negative Negative    10/31/17 Tissue Flow Cytometry:   10/31/17 Lymph Node Needle/Core Biopsy:  11/10/17 Peripheral Blood Flow Cytometry:    RADIOGRAPHIC STUDIES: I have personally reviewed the radiological images as listed and agreed with the findings in the report.  08/28/17 MRI     US Soft Tissue Head & Neck (non-thyroid)  Result Date: 10/26/2017 CLINICAL DATA:  Evaluate cervical lymphadenopathy prior to biopsy. EXAM: ULTRASOUND OF HEAD/NECK SOFT TISSUES TECHNIQUE: Ultrasound examination of the head and neck soft tissues was performed in the area of clinical concern. COMPARISON:  None. FINDINGS: Multiple enlarged hypoechoic lymph nodes along the left side of the neck. These lymph nodes are markedly enlarged for size. Index lymph node measures 1.7 cm in the short axis. IMPRESSION: Multiple enlarged left cervical lymph nodes. Electronically Signed   By: Markus Daft M.D.   On: 10/26/2017 17:57   Nm Pet Image Initial (pi) Skull Base To Thigh  Result Date: 11/22/2017 CLINICAL DATA:  Initial treatment strategy for small B-cell lymphoma. EXAM: NUCLEAR MEDICINE PET SKULL BASE TO THIGH TECHNIQUE: 10 mCi F-18 FDG was injected intravenously. Full-ring PET imaging was performed from the skull base to thigh after the radiotracer. CT data was obtained and used for attenuation correction and anatomic localization. Fasting blood glucose: 107 mg/dl COMPARISON:  None. FINDINGS: Mediastinal blood pool activity: SUV max 2.6 NECK: Diffuse bilateral and supraclavicular lymphadenopathy evident. This lymphadenopathy shows low level hypermetabolism. Index 18 mm short axis left supraclavicular lymph node (image 41/series 4) demonstrates SUV max = 2.6. Incidental CT findings: none CHEST: Relatively bulky lymphadenopathy is seen in both axillary regions, the subpectoral areas, and retrocrural spaces. Relative sparing of the mediastinum and hilar regions although there are some calcified lymph nodes in the mediastinum and  each hilum. Lymphadenopathy demonstrates low level hypermetabolism. 4.9 x 3.3 cm nodal conglomeration in the left axilla (image 62/4) demonstrates SUV max = 3.2. Incidental CT findings: Tiny hiatal hernia. Patchy ground-glass attenuation identified right mid lung (image 77/4), indeterminate on this non breath hold exam. ABDOMEN/PELVIS: Retroperitoneal and central mesenteric lymphadenopathy evident. 15 mm short axis left paramidline central mesenteric lymph node (image 125/4) demonstrates SUV max = 2.3. Index precaval lymph nodes seen near the bifurcation of the aorta (image 138/4) demonstrates SUV max = 2.9. pelvic sidewall lymphadenopathy shows similar low level hypermetabolism. Incidental CT findings: 12 mm water density exophytic lesion lower pole right kidney is compatible with a cyst. SKELETON: No focal hypermetabolic activity to suggest skeletal metastasis. Incidental CT findings: Bone windows reveal no worrisome lytic or sclerotic osseous lesions. IMPRESSION: 1. Relatively bulky lymphadenopathy in the neck, chest, abdomen, and pelvis demonstrates low level hypermetabolism, consistent with known neoplasm. 2. Tiny hiatal hernia. 3. 12 mm right renal cyst. Electronically Signed   By: Misty Stanley M.D.   On: 11/22/2017 14:33   [place] Core Biopsy (lymph Nodes)  Result Date: 10/26/2017 INDICATION: 62 year old with cervical lymphadenopathy. Patient needs a tissue diagnosis. EXAM: ULTRASOUND-GUIDED LEFT CERVICAL LYMPH NODE BIOPSY MEDICATIONS: None. ANESTHESIA/SEDATION: None FLUOROSCOPY TIME:  None COMPLICATIONS: None immediate. PROCEDURE: Informed written consent was obtained from the patient after a thorough discussion of the procedural risks, benefits and alternatives. All questions were addressed. A timeout was performed prior to the initiation of the procedure. Left side of the neck was evaluated with ultrasound. Multiple enlarged lymph nodes were identified. The neck was prepped with chlorhexidine and a sterile  field was created. Skin and soft tissues were anesthetized with 1% lidocaine. Using ultrasound guidance, a total of 6 core biopsies were obtained within enlarged lymph nodes. Two adjacent lymph nodes were sampled. The smaller lymph node had 1 core biopsy. The larger lymph node had 5 core biopsies. Specimens were placed in saline. Bandage placed over the puncture site. Ultrasound images were taken and saved for this procedure. FINDINGS: Multiple enlarged hypoechoic lymph nodes in the left supraclavicular region. No significant bleeding or hematoma formation following core biopsies. IMPRESSION: Ultrasound-guided left cervical lymph node core biopsies. Electronically Signed   By: Markus Daft M.D.   On: 10/26/2017 17:54    ASSESSMENT & PLAN:   62 y.o. male with  1. Newly diagnosed Small B-Cell Lymphoma /Chronic lymphocytic Leukemia.  PLAN -Discussed the constitutional symptoms that the pt should look out for incuding fevers, chills, drenching night sweats, significant fatigue, unexpected and significant weight loss. He notes none of these currently -Provided supplemental information to the pt regardign CLL/SLL, and asked him to let us know if he develops any new or concerning symptoms.  -Just palpable bilateral cervical lymph nodes were noted. No palpable axillary lymph nodes. Borderline splenomegaly.  -Discussed pt labwork today; Lymphs Abs at 8.0k, LDH WNL at 152. -Discussed that the peripheral blood flow confirmed CLL/SLL.  -FISH, CLL Prognostic Panel from 11/10/17 is pending  -Discussed that the most recent PET/CT confirms that the disease is slow growing. -Discussed that the PET/CT revealed Relatively bulky lymphadenopathy in the neck, chest, abdomen, and pelvis demonstrates low level hypermetabolism, consistent with known neoplasm. -Discussed again with pt, the criteria for beginning treatment. -Discussed that at this time the pt does not meet clinical criteria for treatment and we recommend a  wait-and-watch approach with close following of imaging studies and with labs and clinical symptoms -Advised that the pt stay up to date with with annual flu shots and the 2 pneumonia vaccines; he is very hesitant to take the vaccines at this time and will consider this until we see him next.  -Pt will let us know if he develops any constitutional symptoms before we see him in 3 months.   RTC with Dr Irene Limbo with labs in 3 months   All of the patients questions were answered with apparent satisfaction. The patient knows to call the clinic with any problems, questions or concerns.  . The total time spent in the appointment was 40 minutes and more than 50% was on counseling and direct patient cares.   Sullivan Lone MD MS AAHIVMS [facility] Hematology/Oncology Physician [facility] [place]  (Office):       [phone] (Work cell):  [phone] (Fax):           [phone]  11/23/2017 3:45 PM  This document serves as a record of services personally performed by Sullivan Lone, MD. It was created on his behalf by Baldwin Jamaica, a trained medical scribe. The creation of this record is based on the scribe's personal observations and the provider's statements to them.   .I have reviewed the above documentation for accuracy and completeness, and I agree with the  above. .Brunetta Genera MD MS

## 2017-11-23 ENCOUNTER — Encounter: Payer: Self-pay | Admitting: Hematology

## 2017-11-23 ENCOUNTER — Inpatient Hospital Stay: Payer: Medicare HMO | Attending: Hematology | Admitting: Hematology

## 2017-11-23 ENCOUNTER — Telehealth: Payer: Self-pay | Admitting: Hematology

## 2017-11-23 VITALS — BP 136/68 | HR 71 | Temp 98.4°F | Resp 20 | Ht 67.0 in | Wt 204.5 lb

## 2017-11-23 DIAGNOSIS — Z7982 Long term (current) use of aspirin: Secondary | ICD-10-CM | POA: Diagnosis not present

## 2017-11-23 DIAGNOSIS — Z87891 Personal history of nicotine dependence: Secondary | ICD-10-CM | POA: Insufficient documentation

## 2017-11-23 DIAGNOSIS — C9110 Chronic lymphocytic leukemia of B-cell type not having achieved remission: Secondary | ICD-10-CM | POA: Insufficient documentation

## 2017-11-23 DIAGNOSIS — E785 Hyperlipidemia, unspecified: Secondary | ICD-10-CM | POA: Diagnosis not present

## 2017-11-23 DIAGNOSIS — E119 Type 2 diabetes mellitus without complications: Secondary | ICD-10-CM | POA: Diagnosis not present

## 2017-11-23 DIAGNOSIS — E876 Hypokalemia: Secondary | ICD-10-CM | POA: Diagnosis not present

## 2017-11-23 DIAGNOSIS — K219 Gastro-esophageal reflux disease without esophagitis: Secondary | ICD-10-CM | POA: Diagnosis not present

## 2017-11-23 DIAGNOSIS — Z79899 Other long term (current) drug therapy: Secondary | ICD-10-CM | POA: Insufficient documentation

## 2017-11-23 DIAGNOSIS — M4800 Spinal stenosis, site unspecified: Secondary | ICD-10-CM | POA: Insufficient documentation

## 2017-11-23 DIAGNOSIS — Z7984 Long term (current) use of oral hypoglycemic drugs: Secondary | ICD-10-CM | POA: Diagnosis not present

## 2017-11-23 DIAGNOSIS — I10 Essential (primary) hypertension: Secondary | ICD-10-CM | POA: Diagnosis not present

## 2017-11-23 DIAGNOSIS — C8308 Small cell B-cell lymphoma, lymph nodes of multiple sites: Secondary | ICD-10-CM

## 2017-11-23 DIAGNOSIS — N281 Cyst of kidney, acquired: Secondary | ICD-10-CM | POA: Diagnosis not present

## 2017-11-23 NOTE — Telephone Encounter (Signed)
Appointments scheduled AVs/Calendar printed per 4/4 los

## 2017-12-28 LAB — FISH,CLL PROGNOSTIC PANEL

## 2018-02-20 NOTE — Progress Notes (Signed)
HEMATOLOGY/ONCOLOGY CLINIC NOTE  Date of Service: 02/21/18  Patient Care Team: Bernerd Limbo, MD as PCP - General (Family Medicine)  CHIEF COMPLAINTS/PURPOSE OF CONSULTATION:  Small B-Cell Lymphoma  HISTORY OF PRESENTING ILLNESS:   [name] [name] is a wonderful 62 y.o. 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 The pt reports that he is doing well overall.   The pt reports that in August 2018 he felt a knot on the left side of his neck, which he spoke about with his PCP Dr. Coletta Memos. He then had an MRI in January 2019, a CXR in February 2019 and then a biopsy on 10/26/17 (results noted below). He denies any constitutional symptoms as noted below. He notes that the swelling on his neck has not increased in size and is not painful.   He notes that he gets congested at night and is only able to breathe out of one nostril; he denies having a deviated septum. He notes that he has tried to have this worked up for the last 4 years without success and has tried saline sprays without success. He notes that he wakes up with dry mouth.   The pt takes Amlodipine, 81mg  Aspirin, Atorvastatin, Cyclobenzaprine, Ferrousul, Losartan Potassium, Meloxicam, Metformin 500mg , Percocet, Protonix, Potassium Chloride, and Ambein.   Of note prior to the patient's visit today, pt has had Lymph node, needle/core biopsy, Left Supraclavicular completed on 10/31/17 with results revealing SMALL LYMPHOCYTIC LYMPHOMA.  10/26/17 Tissue Flow Cytometry revealed a Monoclonal B Cell population identified.    08/28/17 MRI revealed Extensive cervical adenopathy. Adenopathy also noted in the subpectoral nodes.   CXR on 10/11/17 was revealed to be Normal.   His 10/11/17 CBC revealed all values WNL except for % Lymph at 66.3%, Mono % at 3.3% and % Gran at 30.4%, Lymphs Abs at 6.4k, Hgb at 12.5, HCT at 37.5, MCV at 75.5, MCH at 25.1, RDW at 16.3, MPV at 6.4  On review of  systems, pt reports left cervical lymph node swelling and denies fevers, chills, night sweats, unexpected weight loss, CP, SOB, abdominal pain or swelling, pain along the spine, changes in bowel habits, skin rashes, leg swelling, and any other symptoms.    On PMHx the pt reports Hyperlipidemia, Hypertensive reitonpathy, Hypopotassemia, DM Type 2, acid reflux, spinal stenosis.  On Social Hx the pt reports being a former smoker, having quit in 2012. He notes that he smoked about 5 cigars per week before quitting. He denies consuming much ETOH. He denies any recreational drug use. He denies any unsafe needle exposure.  On Family Hx the pt reports an uncle who had cancer but wasn't sure what kind.  Interval History:  [name] [name] returns today regarding his Small B Cell Lymphoma. The patient's last visit with [place] was on 11/23/17. The pt reports that he is doing well overall.   The pt reports that his known enlarged lymph nodes have been stable and have not caused any new symptoms, and denies noticing any new lumps or bumps.   He notes that his PCP told him that he should begin PO Iron replacement. He hasn't had a colonoscopy since 2008. He notes that he began eating more bananas but hasn't begun Iron replacement. He denies any recent surgeries or changes in bowel habits or concerns for bleeding.   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   Lab results today (02/21/18) of CBC w/diff, CMP, and Reticulocytes is as follows: all values are WNL except for HGB at 9.1, HCT at 28.4, MCV at 67.3, MCH at 21.6, RDW at 16.8, Lymphs abs at 5.6k, Glucose at 100, Creatinine at 1.52, AST at 12, GFR at 55. LDH 02/21/18 is 158 Ferritin 02/21/18 is 8  On review of systems, pt reports stable lymph nodes, brown stools, and denies fatigue, black stools, blood in the stools, hemorrhoids, changes in breathing, SOB, CP, abdominal pains, feelings of fullness, fevers,  chills, night sweats, unexpected weight loss, light headedness, dizziness, and any other symptoms.    MEDICAL HISTORY:  Past Medical History:  Diagnosis Date  . Allergic rhinitis, cause unspecified   . Backache, unspecified   . Body mass index 33.0-33.9, adult   . Elevated blood pressure reading without diagnosis of hypertension   . Esophageal reflux   . Generalized pain   . Heartburn   . Insomnia, unspecified   . 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 Other abnormal blood chemistry   . Other and unspecified hyperlipidemia   . Other dyspnea and respiratory abnormality   . Other nonspecific findings on examination of blood(790.99)   . Pain in joint, lower leg   . Tobacco use disorder   . Unspecified essential hypertension     SURGICAL HISTORY: Past Surgical History:  Procedure Laterality Date  . bil wrist surgery    . ORIF FOREARM FRACTURE     right  . plastic surgery to face      SOCIAL HISTORY: Social History   Socioeconomic History  . Marital status: Legally Separated    Spouse name: Not on file  . Number of children: 1  . Years of education: Not on file  . Highest education level: Not on file  Occupational History  . Not on file  Social Needs  . Financial resource strain: Not on file  . Food insecurity:    Worry: Not on file    Inability: Not on file  . Transportation needs:    Medical: Not on file    Non-medical: Not on file  Tobacco Use  . Smoking status: Former Smoker    Packs/day: 0.20    Years: 0.00    Pack years: 0.00    Types: Cigars  . Smokeless tobacco: Never Used  Substance and Sexual Activity  . Alcohol use: Yes    Comment: 2 40oz beers a week  . Drug use: No  . Sexual activity: Not on file  Lifestyle  . Physical activity:    Days per week: Not on file    Minutes per session: Not on file  . Stress: Not on file  Relationships  . Social connections:    Talks on phone: Not on file    Gets  together: Not on file    Attends religious service: Not on file    Active member of club or organization: Not on file    Attends meetings of clubs or organizations: Not on file    Relationship status: Not on file  . Intimate partner violence:    Fear of current or ex partner: Not on file    Emotionally abused: Not on file    Physically abused: Not on file    Forced sexual activity: Not on file  Other Topics Concern  . Not on file  Social History Narrative  . Not on file    FAMILY HISTORY:  Family History  Problem Relation Age of Onset  . Diabetes Mother   . Hypertension Mother   . Cancer Paternal Uncle     ALLERGIES:  is allergic to lisinopril and tuberculin tests.  MEDICATIONS:  Current Outpatient Medications  Medication Sig Dispense Refill  . amLODipine (NORVASC) 10 MG tablet Take 10 mg by mouth daily.    [name] aspirin EC 81 MG tablet Take 81 mg by mouth daily.    [name] atorvastatin (LIPITOR) 20 MG tablet Take 20 mg by mouth daily.    . cyclobenzaprine (FLEXERIL) 10 MG tablet Take 10 mg by mouth at bedtime.    [name] losartan (COZAAR) 50 MG tablet Take 50 mg by mouth daily.    . meloxicam (MOBIC) 15 MG tablet Take 15 mg by mouth daily.    . metFORMIN (GLUCOPHAGE) 500 MG tablet Take 500 mg by mouth daily.    [name] oxyCODONE-acetaminophen (PERCOCET) 7.5-325 MG per tablet Take 1 tablet by mouth 2 (two) times daily as needed for severe pain.     . pantoprazole (PROTONIX) 40 MG tablet Take 40 mg by mouth daily.    . potassium chloride SA (K-DUR,KLOR-CON) 20 MEQ tablet Take 20 mEq by mouth daily.    [name] zolpidem (AMBIEN) 10 MG tablet Take 10 mg by mouth at bedtime.      No current facility-administered medications for this visit.     REVIEW OF SYSTEMS:    A 10+ POINT REVIEW OF SYSTEMS WAS OBTAINED including neurology, dermatology, psychiatry, cardiac, respiratory, lymph, extremities, GI, GU, Musculoskeletal, constitutional, breasts, reproductive, HEENT.  All pertinent positives are noted in the  HPI.  All others are negative.   PHYSICAL EXAMINATION: ECOG PERFORMANCE STATUS: 1 - Symptomatic but completely ambulatory  . Vitals:   02/21/18 1341  BP: 117/66  Pulse: 72  Resp: 18  Temp: 98.2 F (36.8 C)  SpO2: 98%   Filed Weights   02/21/18 1341  Weight: 198 lb 3.2 oz (89.9 kg)   .Body mass index is 31.04 kg/m.  GENERAL:alert, in no acute distress and comfortable SKIN: no acute rashes, no significant lesions EYES: conjunctiva are pink and non-injected, sclera anicteric OROPHARYNX: MMM, no exudates, no oropharyngeal erythema or ulceration NECK: supple, no JVD LYMPH:  no palpable lymphadenopathy in the axillary or inguinal regions, small palpable cervical LNadenopathy LUNGS: clear to auscultation b/l with normal respiratory effort HEART: regular rate & rhythm ABDOMEN:  normoactive bowel sounds , non tender, not distended. No palpable hepatomegaly, borderline palpable splenomegaly Extremity: no pedal edema PSYCH: alert & oriented x 3 with fluent speech NEURO: no focal motor/sensory deficits   LABORATORY DATA:  I have reviewed the data as listed  . CBC Latest Ref Rng & Units 02/21/2018 11/10/2017  WBC 4.0 - 10.3 K/uL 8.5 10.8(H)  Hemoglobin 13.0 - 17.1 g/dL 9.1(L) 11.9(L)  Hematocrit 38.4 - 49.9 % 28.4(L) 35.0(L)  Platelets 140 - 400 K/uL 202 228    CBC    Component Value Date/Time   WBC 8.5 02/21/2018 1322   RBC 4.22 02/21/2018 1322   RBC 4.22 02/21/2018 1322   HGB 9.1 (L) 02/21/2018 1322   HGB 11.9 (L) 11/10/2017 1008   HCT 28.4 (L) 02/21/2018 1322   PLT 202 02/21/2018 1322   PLT 228 11/10/2017 1008   MCV 67.3 (L) 02/21/2018 1322   MCH 21.6 (L) 02/21/2018 1322   MCHC 32.0 02/21/2018 1322   RDW 16.8 (H) 02/21/2018 1322   LYMPHSABS 5.6 (H) 02/21/2018 1322   MONOABS 0.5 02/21/2018 1322  EOSABS 0.0 02/21/2018 1322   BASOSABS 0.0 02/21/2018 1322    CMP Latest Ref Rng & Units 02/21/2018 11/10/2017  Glucose 70 - 99 mg/dL 100(H) 115  BUN 8 - 23 mg/dL 10 13    Creatinine 0.61 - 1.24 mg/dL 1.52(H) 1.30  Sodium 135 - 145 mmol/L 139 139  Potassium 3.5 - 5.1 mmol/L 4.4 4.1  Chloride 98 - 111 mmol/L 110 107  CO2 22 - 32 mmol/L 23 23  Calcium 8.9 - 10.3 mg/dL 9.3 9.7  Total Protein 6.5 - 8.1 g/dL 7.3 7.7  Total Bilirubin 0.3 - 1.2 mg/dL 0.9 1.0  Alkaline Phos 38 - 126 U/L 107 108  AST 15 - 41 U/L 12(L) 10  ALT 0 - 44 U/L 9 8   . Lab Results  Component Value Date   IRON 29 (L) 02/21/2018   TIBC 471 (H) 02/21/2018   IRONPCTSAT 6 (L) 02/21/2018   (Iron and TIBC)  Lab Results  Component Value Date   FERRITIN 8 (L) 02/21/2018    Component     Latest Ref Rng & Units 11/10/2017  Retic Ct Pct     0.8 - 1.8 % 1.3  RBC.     4.20 - 5.82 MIL/uL 4.75  Retic Count, Absolute     34.8 - 93.9 K/uL 61.8  LDH     125 - 245 U/L 152  HCV Ab     0.0 - 0.9 s/co ratio <0.1  HIV Screen 4th Generation wRfx     Non Reactive Non Reactive  Hepatitis B Surface Ag     Negative Negative  Hep B Core Ab, Tot     Negative Negative    10/31/17 Tissue Flow Cytometry:   10/31/17 Lymph Node Needle/Core Biopsy:  11/10/17 Peripheral Blood Flow Cytometry:   11/10/17 FISH CLL Prognostic Panel:      RADIOGRAPHIC STUDIES: I have personally reviewed the radiological images as listed and agreed with the findings in the report.  08/28/17 MRI     No results found.  ASSESSMENT & PLAN:   62 y.o. male with  1. Recently diagnosed Small B-Cell Lymphoma /Chronic lymphocytic Leukemia. 2. Newly noted Microcytic anemia with drop in hgb from 11.9 to 9.1 with severe iron deficiency. Not primarily related to lymphoma. 3. Severe Iron deficiency - unclear etiology -- will need GI workup PLAN -Discussed pt labwork today, 02/21/18; HGB decreased to 9.1, Lymphs abs decreased to 5.6k, new microcytosis with MCV at 67.3 -Discussed the 11/10/17 FISH, CLL Prognostic panel findings which revealed Trisomy 12 -Strongly recommended following up with his GI doctor Dr Owens Loffler  for rpt GI evaluation to determine the etiology of his new severe Iron deficiency anemia with consideration of repeating colonoscopy, -GI referral placed -will setup for IV Injectafer weekly x 2 doses   RTC with Dr Irene Limbo in 6 weeks with labs   All of the patients questions were answered with apparent satisfaction. The patient knows to call the clinic with any problems, questions or concerns.  The total time spent in the appt was 25 minutes and more than 50% was on counseling and direct patient cares.   Sullivan Lone MD MS AAHIVMS [facility] Hematology/Oncology Physician [facility]  (Office):       [phone] (Work cell):  [phone] (Fax):           [phone]  02/21/2018 2:25 PM  I, Baldwin [place], am acting as a Education administrator for Dr Irene Limbo.   .I have reviewed the above  documentation for accuracy and completeness, and I agree with the above. Brunetta Genera MD

## 2018-02-21 ENCOUNTER — Inpatient Hospital Stay: Payer: Medicare HMO | Attending: Hematology

## 2018-02-21 ENCOUNTER — Telehealth: Payer: Self-pay | Admitting: Hematology

## 2018-02-21 ENCOUNTER — Inpatient Hospital Stay (HOSPITAL_BASED_OUTPATIENT_CLINIC_OR_DEPARTMENT_OTHER): Payer: Medicare HMO | Admitting: Hematology

## 2018-02-21 VITALS — BP 117/66 | HR 72 | Temp 98.2°F | Resp 18 | Ht 67.0 in | Wt 198.2 lb

## 2018-02-21 DIAGNOSIS — C9110 Chronic lymphocytic leukemia of B-cell type not having achieved remission: Secondary | ICD-10-CM | POA: Diagnosis not present

## 2018-02-21 DIAGNOSIS — D509 Iron deficiency anemia, unspecified: Secondary | ICD-10-CM | POA: Insufficient documentation

## 2018-02-21 DIAGNOSIS — D500 Iron deficiency anemia secondary to blood loss (chronic): Secondary | ICD-10-CM

## 2018-02-21 DIAGNOSIS — C8308 Small cell B-cell lymphoma, lymph nodes of multiple sites: Secondary | ICD-10-CM

## 2018-02-21 DIAGNOSIS — C9190 Lymphoid leukemia, unspecified not having achieved remission: Secondary | ICD-10-CM | POA: Diagnosis not present

## 2018-02-21 LAB — CMP (CANCER CENTER ONLY)
ALBUMIN: 4.5 g/dL (ref 3.5–5.0)
ALK PHOS: 107 U/L (ref 38–126)
ALT: 9 U/L (ref 0–44)
ANION GAP: 6 (ref 5–15)
AST: 12 U/L — ABNORMAL LOW (ref 15–41)
BILIRUBIN TOTAL: 0.9 mg/dL (ref 0.3–1.2)
BUN: 10 mg/dL (ref 8–23)
CALCIUM: 9.3 mg/dL (ref 8.9–10.3)
CO2: 23 mmol/L (ref 22–32)
Chloride: 110 mmol/L (ref 98–111)
Creatinine: 1.52 mg/dL — ABNORMAL HIGH (ref 0.61–1.24)
GFR, Est AFR Am: 55 mL/min — ABNORMAL LOW (ref >60)
GFR, Estimated: 48 mL/min — ABNORMAL LOW (ref >60)
GLUCOSE: 100 mg/dL — AB (ref 70–99)
Potassium: 4.4 mmol/L (ref 3.5–5.1)
Sodium: 139 mmol/L (ref 135–145)
TOTAL PROTEIN: 7.3 g/dL (ref 6.5–8.1)

## 2018-02-21 LAB — CBC WITH DIFFERENTIAL/PLATELET
BASOS PCT: 0 %
Basophils Absolute: 0 10*3/uL (ref 0.0–0.1)
EOS ABS: 0 10*3/uL (ref 0.0–0.5)
Eosinophils Relative: 1 %
HCT: 28.4 % — ABNORMAL LOW (ref 38.4–49.9)
Hemoglobin: 9.1 g/dL — ABNORMAL LOW (ref 13.0–17.1)
LYMPHS ABS: 5.6 10*3/uL — AB (ref 0.9–3.3)
Lymphocytes Relative: 66 %
MCH: 21.6 pg — AB (ref 27.2–33.4)
MCHC: 32 g/dL (ref 32.0–36.0)
MCV: 67.3 fL — ABNORMAL LOW (ref 79.3–98.0)
MONOS PCT: 6 %
Monocytes Absolute: 0.5 10*3/uL (ref 0.1–0.9)
NEUTROS PCT: 27 %
Neutro Abs: 2.3 10*3/uL (ref 1.5–6.5)
Platelets: 202 10*3/uL (ref 140–400)
RBC: 4.22 MIL/uL (ref 4.20–5.82)
RDW: 16.8 % — AB (ref 11.0–14.6)
WBC: 8.5 10*3/uL (ref 4.0–10.3)

## 2018-02-21 LAB — RETICULOCYTES
RBC.: 4.22 MIL/uL (ref 4.20–5.82)
RETIC CT PCT: 0.9 % (ref 0.8–1.8)
Retic Count, Absolute: 38 10*3/uL (ref 34.8–93.9)

## 2018-02-21 LAB — LACTATE DEHYDROGENASE: LDH: 158 U/L (ref 98–192)

## 2018-02-21 NOTE — Telephone Encounter (Signed)
Appointments scheduled AVS/Calendar printed per 7/3 los °

## 2018-02-23 LAB — IRON AND TIBC
IRON: 29 ug/dL — AB (ref 42–163)
SATURATION RATIOS: 6 % — AB (ref 42–163)
TIBC: 471 ug/dL — ABNORMAL HIGH (ref 202–409)
UIBC: 442 ug/dL

## 2018-02-23 LAB — FERRITIN: FERRITIN: 8 ng/mL — AB (ref 24–336)

## 2018-02-26 DIAGNOSIS — D509 Iron deficiency anemia, unspecified: Secondary | ICD-10-CM | POA: Insufficient documentation

## 2018-02-27 ENCOUNTER — Encounter: Payer: Self-pay | Admitting: Gastroenterology

## 2018-04-04 ENCOUNTER — Telehealth: Payer: Self-pay | Admitting: Hematology

## 2018-04-04 ENCOUNTER — Inpatient Hospital Stay: Payer: Medicare HMO

## 2018-04-04 ENCOUNTER — Inpatient Hospital Stay: Payer: Medicare HMO | Admitting: Hematology

## 2018-04-04 ENCOUNTER — Telehealth: Payer: Self-pay

## 2018-04-04 NOTE — Telephone Encounter (Signed)
Scheduled appt per 8/14 sch message - pt is aware of appt date and time.

## 2018-04-04 NOTE — Telephone Encounter (Signed)
I called the patient because he missed his appointment today at 1400.  He stated that he cancelled this appointment a few days ago.  I cancelled these for him and sent a scheduling message asking for them to reach out to him to reschedule.

## 2018-05-01 ENCOUNTER — Ambulatory Visit: Payer: Medicare HMO | Admitting: Gastroenterology

## 2018-05-01 ENCOUNTER — Encounter: Payer: Self-pay | Admitting: Gastroenterology

## 2018-05-01 VITALS — BP 138/64 | HR 72 | Ht 67.0 in | Wt 188.4 lb

## 2018-05-01 DIAGNOSIS — D509 Iron deficiency anemia, unspecified: Secondary | ICD-10-CM

## 2018-05-01 MED ORDER — PEG 3350-KCL-NA BICARB-NACL 420 G PO SOLR: 4000.0000 mL | ORAL | 0 refills | Status: DC

## 2018-05-01 NOTE — Progress Notes (Signed)
HPI: This is a very pleasant 62 year old man who was referred to me by Bernerd Limbo, MD  to evaluate iron deficiency anemia.    Chief complaint is iron deficiency anemia  EGD, Dr. Ardis Hughs January 2014 for dysphasia found minor reflux esophagitis.  Colonoscopy in 2008, [place] done for routine risk screening was normal.  He sees an Materials engineer here in [place] for small B cell lymphoma.  His anemia is not felt to be related to his lymphoma per his oncologist recent note.  He was also given iron injections for 2 doses.  He has no overt bleeding that he is aware of.  No black stools, no red rectal bleeding.  No vomiting blood.  No nosebleeds.  No abdominal pains.  He has no trouble eating with abdominal pains or dysphasia.  His weight has been overall stable  Old Data Reviewed:  Blood work 5 months ago shows a hemoglobin of 11.9 and an MCV of 73.7.  Blood work 2 months ago shows a hemoglobin of 9.1 and an MCV of 67.  Also a ferritin of 8, iron 29, ribc 471.  Complete metabolic profile was essentially normal 2 months ago except for a creatinine of 1.5   Review of systems: Pertinent positive and negative review of systems were noted in the above HPI section. All other review negative.   Past Medical History:  Diagnosis Date  . Allergic rhinitis, cause unspecified   . Backache, unspecified   . Body mass index 33.0-33.9, adult   . Elevated blood pressure reading without diagnosis of hypertension   . Esophageal reflux   . Generalized pain   . Heartburn   . Insomnia, unspecified   . 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 Other abnormal blood chemistry   . Other and unspecified hyperlipidemia   . Other dyspnea and respiratory abnormality   . Other nonspecific findings on examination of blood(790.99)   . Pain in joint, lower leg   . Tobacco use disorder   . Unspecified essential hypertension     Past Surgical History:  Procedure  Laterality Date  . bil wrist surgery    . ORIF FOREARM FRACTURE     right  . plastic surgery to face      Current Outpatient Medications  Medication Sig Dispense Refill  . amLODipine (NORVASC) 10 MG tablet Take 10 mg by mouth daily.    [name] aspirin EC 81 MG tablet Take 81 mg by mouth daily.    [name] atorvastatin (LIPITOR) 20 MG tablet Take 20 mg by mouth daily.    . cyclobenzaprine (FLEXERIL) 10 MG tablet Take 10 mg by mouth at bedtime.    [name] losartan (COZAAR) 50 MG tablet Take 50 mg by mouth daily.    . meloxicam (MOBIC) 15 MG tablet Take 15 mg by mouth daily.    . metFORMIN (GLUCOPHAGE) 500 MG tablet Take 500 mg by mouth daily.    [name] oxyCODONE-acetaminophen (PERCOCET) 7.5-325 MG per tablet Take 1 tablet by mouth 2 (two) times daily as needed for severe pain.     . pantoprazole (PROTONIX) 40 MG tablet Take 40 mg by mouth daily.    . potassium chloride SA (K-DUR,KLOR-CON) 20 MEQ tablet Take 20 mEq by mouth daily.    [name] zolpidem (AMBIEN) 10 MG tablet Take 10 mg by mouth at bedtime.      No current facility-administered medications for this visit.     Allergies as of 05/01/2018 - Review Complete 05/01/2018  Allergen Reaction Noted  . Lisinopril Itching 01/09/2014  . Tuberculin tests Swelling 09/04/2012    Family History  Problem Relation Age of Onset  . Diabetes Mother   . Hypertension Mother   . Cancer Paternal Uncle     Social History   Socioeconomic History  . Marital status: Legally Separated    Spouse name: Not on file  . Number of children: 1  . Years of education: Not on file  . Highest education level: Not on file  Occupational History  . Not on file  Social Needs  . Financial resource strain: Not on file  . Food insecurity:    Worry: Not on file    Inability: Not on file  . Transportation needs:    Medical: Not on file    Non-medical: Not on file  Tobacco Use  . Smoking status: Former Smoker    Packs/day: 0.20    Years: 0.00    Pack years: 0.00    Types:  Cigars  . Smokeless tobacco: Never Used  Substance and Sexual Activity  . Alcohol use: Yes    Comment: 2 40oz beers a week  . Drug use: No  . Sexual activity: Not on file  Lifestyle  . Physical activity:    Days per week: Not on file    Minutes per session: Not on file  . Stress: Not on file  Relationships  . Social connections:    Talks on phone: Not on file    Gets together: Not on file    Attends religious service: Not on file    Active member of club or organization: Not on file    Attends meetings of clubs or organizations: Not on file    Relationship status: Not on file  . Intimate partner violence:    Fear of current or ex partner: Not on file    Emotionally abused: Not on file    Physically abused: Not on file    Forced sexual activity: Not on file  Other Topics Concern  . Not on file  Social History Narrative  . Not on file     Physical Exam: BP 138/64   Pulse 72   Ht 5\' 7"  (1.702 m)   Wt 188 lb 6.4 oz (85.5 kg)   BMI 29.51 kg/m  Constitutional: generally well-appearing Psychiatric: alert and oriented x3 Eyes: extraocular movements intact Mouth: oral pharynx moist, no lesions Neck: supple no lymphadenopathy Cardiovascular: heart regular rate and rhythm Lungs: clear to auscultation bilaterally Abdomen: soft, nontender, nondistended, no obvious ascites, no peritoneal signs, normal bowel sounds Extremities: no lower extremity edema bilaterally Skin: no lesions on visible extremities   Assessment and plan: 62 y.o. male with iron deficiency anemia  I recommended a colonoscopy at his soonest convenience for his new iron deficiency anemia.  His last colonoscopy was about 11 years ago in [place] and it was normal.  I see no reason for any further blood tests or imaging studies prior to the colonoscopy.  He had an EGD 5 years ago and it was essentially normal except for mild esophagitis and he has 0 upper GI symptoms and so for now we will just plan on  colonoscopy.    Please see the "Patient Instructions" section for addition details about the plan.   Owens Loffler, MD [facility] 05/01/2018, 11:13 AM  Cc: Bernerd Limbo, MD

## 2018-05-01 NOTE — Patient Instructions (Addendum)
You will be set up for a colonoscopy for iron deficiency anemia ([place]).  Normal BMI (Body Mass Index- based on height and weight) is between 19 and 25. Your BMI today is Body mass index is 29.51 kg/m. [name] Please consider follow up  regarding your BMI with your Primary Care Provider.  Thank you for entrusting me with your care and choosing [place] [facility].  Dr Ardis Hughs

## 2018-05-03 ENCOUNTER — Inpatient Hospital Stay: Payer: Medicare HMO | Attending: Hematology

## 2018-05-03 ENCOUNTER — Inpatient Hospital Stay (HOSPITAL_BASED_OUTPATIENT_CLINIC_OR_DEPARTMENT_OTHER): Payer: Medicare HMO | Admitting: Hematology

## 2018-05-03 ENCOUNTER — Telehealth: Payer: Self-pay

## 2018-05-03 ENCOUNTER — Encounter: Payer: Self-pay | Admitting: Hematology

## 2018-05-03 VITALS — BP 138/69 | HR 68 | Temp 98.0°F | Resp 18 | Ht 67.0 in | Wt 192.6 lb

## 2018-05-03 DIAGNOSIS — C8308 Small cell B-cell lymphoma, lymph nodes of multiple sites: Secondary | ICD-10-CM

## 2018-05-03 DIAGNOSIS — C9110 Chronic lymphocytic leukemia of B-cell type not having achieved remission: Secondary | ICD-10-CM

## 2018-05-03 DIAGNOSIS — D509 Iron deficiency anemia, unspecified: Secondary | ICD-10-CM | POA: Insufficient documentation

## 2018-05-03 DIAGNOSIS — D500 Iron deficiency anemia secondary to blood loss (chronic): Secondary | ICD-10-CM

## 2018-05-03 LAB — CBC WITH DIFFERENTIAL/PLATELET
BASOS PCT: 0 %
Basophils Absolute: 0 10*3/uL (ref 0.0–0.1)
EOS ABS: 0.1 10*3/uL (ref 0.0–0.5)
Eosinophils Relative: 1 %
HCT: 29.6 % — ABNORMAL LOW (ref 38.4–49.9)
Hemoglobin: 9.2 g/dL — ABNORMAL LOW (ref 13.0–17.1)
Lymphocytes Relative: 79 %
Lymphs Abs: 8.5 10*3/uL — ABNORMAL HIGH (ref 0.9–3.3)
MCH: 21.8 pg — ABNORMAL LOW (ref 27.2–33.4)
MCHC: 31.1 g/dL — AB (ref 32.0–36.0)
MCV: 70 fL — ABNORMAL LOW (ref 79.3–98.0)
MONO ABS: 0.1 10*3/uL (ref 0.1–0.9)
MONOS PCT: 1 %
NEUTROS ABS: 2.1 10*3/uL (ref 1.5–6.5)
NEUTROS PCT: 19 %
PLATELETS: 240 10*3/uL (ref 140–400)
RBC: 4.23 MIL/uL (ref 4.20–5.82)
RDW: 22.4 % — AB (ref 11.0–14.6)
WBC: 10.9 10*3/uL — ABNORMAL HIGH (ref 4.0–10.3)

## 2018-05-03 LAB — CMP (CANCER CENTER ONLY)
ALK PHOS: 106 U/L (ref 38–126)
ALT: 6 U/L (ref 0–44)
ANION GAP: 7 (ref 5–15)
AST: 11 U/L — AB (ref 15–41)
Albumin: 4 g/dL (ref 3.5–5.0)
BILIRUBIN TOTAL: 0.6 mg/dL (ref 0.3–1.2)
BUN: 11 mg/dL (ref 8–23)
CALCIUM: 9 mg/dL (ref 8.9–10.3)
CO2: 23 mmol/L (ref 22–32)
Chloride: 108 mmol/L (ref 98–111)
Creatinine: 1.24 mg/dL (ref 0.61–1.24)
GFR, Est AFR Am: >60 mL/min (ref >60)
Glucose, Bld: 134 mg/dL — ABNORMAL HIGH (ref 70–99)
POTASSIUM: 4.1 mmol/L (ref 3.5–5.1)
Sodium: 138 mmol/L (ref 135–145)
Total Protein: 7.1 g/dL (ref 6.5–8.1)

## 2018-05-03 LAB — IRON AND TIBC
Iron: 32 ug/dL — ABNORMAL LOW (ref 42–163)
SATURATION RATIOS: 7 % — AB (ref 42–163)
TIBC: 436 ug/dL — ABNORMAL HIGH (ref 202–409)
UIBC: 404 ug/dL

## 2018-05-03 LAB — FERRITIN: FERRITIN: 5 ng/mL — AB (ref 24–336)

## 2018-05-03 NOTE — Progress Notes (Signed)
HEMATOLOGY/ONCOLOGY CLINIC NOTE  Date of Service:  05/03/18   Patient Care Team: Bernerd Limbo, MD as PCP - General (Family Medicine)  CHIEF COMPLAINTS/PURPOSE OF CONSULTATION:  Small B-Cell Lymphoma  HISTORY OF PRESENTING ILLNESS:   [name] [name] is a wonderful 62 y.o. 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 The pt reports that he is doing well overall.   The pt reports that in August 2018 he felt a knot on the left side of his neck, which he spoke about with his PCP Dr. Coletta Memos. He then had an MRI in January 2019, a CXR in February 2019 and then a biopsy on 10/26/17 (results noted below). He denies any constitutional symptoms as noted below. He notes that the swelling on his neck has not increased in size and is not painful.   He notes that he gets congested at night and is only able to breathe out of one nostril; he denies having a deviated septum. He notes that he has tried to have this worked up for the last 4 years without success and has tried saline sprays without success. He notes that he wakes up with dry mouth.   The pt takes Amlodipine, 81mg  Aspirin, Atorvastatin, Cyclobenzaprine, Ferrousul, Losartan Potassium, Meloxicam, Metformin 500mg , Percocet, Protonix, Potassium Chloride, and Ambein.   Of note prior to the patient's visit today, pt has had Lymph node, needle/core biopsy, Left Supraclavicular completed on 10/31/17 with results revealing SMALL LYMPHOCYTIC LYMPHOMA.  10/26/17 Tissue Flow Cytometry revealed a Monoclonal B Cell population identified.    08/28/17 MRI revealed Extensive cervical adenopathy. Adenopathy also noted in the subpectoral nodes.   CXR on 10/11/17 was revealed to be Normal.   His 10/11/17 CBC revealed all values WNL except for % Lymph at 66.3%, Mono % at 3.3% and % Gran at 30.4%, Lymphs Abs at 6.4k, Hgb at 12.5, HCT at 37.5, MCV at 75.5, MCH at 25.1, RDW at 16.3, MPV at 6.4  On review of  systems, pt reports left cervical lymph node swelling and denies fevers, chills, night sweats, unexpected weight loss, CP, SOB, abdominal pain or swelling, pain along the spine, changes in bowel habits, skin rashes, leg swelling, and any other symptoms.    On PMHx the pt reports Hyperlipidemia, Hypertensive reitonpathy, Hypopotassemia, DM Type 2, acid reflux, spinal stenosis.  On Social Hx the pt reports being a former smoker, having quit in 2012. He notes that he smoked about 5 cigars per week before quitting. He denies consuming much ETOH. He denies any recreational drug use. He denies any unsafe needle exposure.  On Family Hx the pt reports an uncle who had cancer but wasn't sure what kind.  Interval History:  [name] [name] returns today regarding his Small B Cell Lymphoma. The patient's last visit with [place] was on 02/21/18. The pt reports that he is doing well overall.   The pt reports that he has begun care with GI Dr. Owens Loffler and is anticipating a colonoscopy soon. He notes that he forgot to get his IV Injectafer in the interim. He notes that he stopped taking Meloxicam and continues to take 81mg  aspirin, Metformin and Percocet. He notes that his back pain continues.   The pt notes that his cervical enlarged lymph node has not bothered him at all and he has not developed any constitutional symptoms.   Lab results today (05/03/18) of CBC w/diff, CMP is as follows: all  values are WNL except for WBC at 10.9k, HGB at 9.2, HCT at 29.6, MCV at 70.0, MCH at 21.8, MCHC at 31.1, RDW at 22.4, Lymphs abs at 8.5k, Glucose at 134, AST at 11. 05/03/18 Ferritin is at 5 05/03/18 Iron and TIBC shows Iron at 32, TIBC at 436, 7% Saturation Ratio  On review of systems, pt reports continued back pain, stable enlarged neck lymph node, good energy levels, and denies blood in the stools, black stools, new fatigue, change in appetite, unexpected weight loss, fevers, chills, night sweats, abdominal pains, and any  other symptoms.   MEDICAL HISTORY:  Past Medical History:  Diagnosis Date  . Allergic rhinitis, cause unspecified   . Backache, unspecified   . Body mass index 33.0-33.9, adult   . Elevated blood pressure reading without diagnosis of hypertension   . Esophageal reflux   . Generalized pain   . Heartburn   . Insomnia, unspecified   . 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 Other abnormal blood chemistry   . Other and unspecified hyperlipidemia   . Other dyspnea and respiratory abnormality   . Other nonspecific findings on examination of blood(790.99)   . Pain in joint, lower leg   . Tobacco use disorder   . Unspecified essential hypertension     SURGICAL HISTORY: Past Surgical History:  Procedure Laterality Date  . bil wrist surgery    . ORIF FOREARM FRACTURE     right  . plastic surgery to face      SOCIAL HISTORY: Social History   Socioeconomic History  . Marital status: Legally Separated    Spouse name: Not on file  . Number of children: 1  . Years of education: Not on file  . Highest education level: Not on file  Occupational History  . Not on file  Social Needs  . Financial resource strain: Not on file  . Food insecurity:    Worry: Not on file    Inability: Not on file  . Transportation needs:    Medical: Not on file    Non-medical: Not on file  Tobacco Use  . Smoking status: Former Smoker    Packs/day: 0.20    Years: 0.00    Pack years: 0.00    Types: Cigars  . Smokeless tobacco: Never Used  Substance and Sexual Activity  . Alcohol use: Yes    Comment: 2 40oz beers a week  . Drug use: No  . Sexual activity: Not on file  Lifestyle  . Physical activity:    Days per week: Not on file    Minutes per session: Not on file  . Stress: Not on file  Relationships  . Social connections:    Talks on phone: Not on file    Gets together: Not on file    Attends religious service: Not on file    Active member of  club or organization: Not on file    Attends meetings of clubs or organizations: Not on file    Relationship status: Not on file  . Intimate partner violence:    Fear of current or ex partner: Not on file    Emotionally abused: Not on file    Physically abused: Not on file    Forced sexual activity: Not on file  Other Topics Concern  . Not on file  Social History Narrative  . Not on file    FAMILY HISTORY: Family History  Problem Relation Age of Onset  .  Diabetes Mother   . Hypertension Mother   . Cancer Paternal Uncle     ALLERGIES:  is allergic to lisinopril and tuberculin tests.  MEDICATIONS:  Current Outpatient Medications  Medication Sig Dispense Refill  . amLODipine (NORVASC) 10 MG tablet Take 10 mg by mouth daily.    [name] aspirin EC 81 MG tablet Take 81 mg by mouth daily.    [name] atorvastatin (LIPITOR) 20 MG tablet Take 20 mg by mouth daily.    . cyclobenzaprine (FLEXERIL) 10 MG tablet Take 10 mg by mouth at bedtime.    [name] losartan (COZAAR) 50 MG tablet Take 50 mg by mouth daily.    . meloxicam (MOBIC) 15 MG tablet Take 15 mg by mouth daily.    . metFORMIN (GLUCOPHAGE) 500 MG tablet Take 500 mg by mouth daily.    [name] oxyCODONE-acetaminophen (PERCOCET) 7.5-325 MG per tablet Take 1 tablet by mouth 2 (two) times daily as needed for severe pain.     . pantoprazole (PROTONIX) 40 MG tablet Take 40 mg by mouth daily.    . polyethylene glycol-electrolytes (NULYTELY/GOLYTELY) 420 g solution Take 4,000 mLs by mouth as directed. 4000 mL 0  . potassium chloride SA (K-DUR,KLOR-CON) 20 MEQ tablet Take 20 mEq by mouth daily.    [name] zolpidem (AMBIEN) 10 MG tablet Take 10 mg by mouth at bedtime.      No current facility-administered medications for this visit.     REVIEW OF SYSTEMS:    A 10+ POINT REVIEW OF SYSTEMS WAS OBTAINED including neurology, dermatology, psychiatry, cardiac, respiratory, lymph, extremities, GI, GU, Musculoskeletal, constitutional, breasts, reproductive, HEENT.  All  pertinent positives are noted in the HPI.  All others are negative.   PHYSICAL EXAMINATION: ECOG PERFORMANCE STATUS: 1 - Symptomatic but completely ambulatory  . Vitals:   05/03/18 1449  BP: 138/69  Pulse: 68  Resp: 18  Temp: 98 F (36.7 C)  SpO2: 98%   Filed Weights   05/03/18 1449  Weight: 192 lb 9.6 oz (87.4 kg)   .Body mass index is 30.17 kg/m.  GENERAL:alert, in no acute distress and comfortable SKIN: no acute rashes, no significant lesions EYES: conjunctiva are pink and non-injected, sclera anicteric OROPHARYNX: MMM, no exudates, no oropharyngeal erythema or ulceration NECK: supple, no JVD LYMPH:  Small palpable cervical LNadenopathy no palpable lymphadenopathy in the axillary or inguinal regions LUNGS: clear to auscultation b/l with normal respiratory effort HEART: regular rate & rhythm ABDOMEN:  normoactive bowel sounds , non tender, not distended. No palpable hepatosplenomegaly.  Extremity: no pedal edema PSYCH: alert & oriented x 3 with fluent speech NEURO: no focal motor/sensory deficits   LABORATORY DATA:  I have reviewed the data as listed  . CBC Latest Ref Rng & Units 05/03/2018 02/21/2018 11/10/2017  WBC 4.0 - 10.3 K/uL 10.9(H) 8.5 10.8(H)  Hemoglobin 13.0 - 17.1 g/dL 9.2(L) 9.1(L) 11.9(L)  Hematocrit 38.4 - 49.9 % 29.6(L) 28.4(L) 35.0(L)  Platelets 140 - 400 K/uL 240 202 228    CBC    Component Value Date/Time   WBC 10.9 (H) 05/03/2018 1329   RBC 4.23 05/03/2018 1329   HGB 9.2 (L) 05/03/2018 1329   HGB 11.9 (L) 11/10/2017 1008   HCT 29.6 (L) 05/03/2018 1329   PLT 240 05/03/2018 1329   PLT 228 11/10/2017 1008   MCV 70.0 (L) 05/03/2018 1329   MCH 21.8 (L) 05/03/2018 1329   MCHC 31.1 (L) 05/03/2018 1329   RDW 22.4 (H) 05/03/2018 1329   LYMPHSABS 8.5 (H) 05/03/2018  1329   MONOABS 0.1 05/03/2018 1329   EOSABS 0.1 05/03/2018 1329   BASOSABS 0.0 05/03/2018 1329    CMP Latest Ref Rng & Units 05/03/2018 02/21/2018 11/10/2017  Glucose 70 - 99 mg/dL  134(H) 100(H) 115  BUN 8 - 23 mg/dL 11 10 13   Creatinine 0.61 - 1.24 mg/dL 1.24 1.52(H) 1.30  Sodium 135 - 145 mmol/L 138 139 139  Potassium 3.5 - 5.1 mmol/L 4.1 4.4 4.1  Chloride 98 - 111 mmol/L 108 110 107  CO2 22 - 32 mmol/L 23 23 23   Calcium 8.9 - 10.3 mg/dL 9.0 9.3 9.7  Total Protein 6.5 - 8.1 g/dL 7.1 7.3 7.7  Total Bilirubin 0.3 - 1.2 mg/dL 0.6 0.9 1.0  Alkaline Phos 38 - 126 U/L 106 107 108  AST 15 - 41 U/L 11(L) 12(L) 10  ALT 0 - 44 U/L 6 9 8    . Lab Results  Component Value Date   IRON 32 (L) 05/03/2018   TIBC 436 (H) 05/03/2018   IRONPCTSAT 7 (L) 05/03/2018   (Iron and TIBC)  Lab Results  Component Value Date   FERRITIN 5 (L) 05/03/2018    Component     Latest Ref Rng & Units 11/10/2017  Retic Ct Pct     0.8 - 1.8 % 1.3  RBC.     4.20 - 5.82 MIL/uL 4.75  Retic Count, Absolute     34.8 - 93.9 K/uL 61.8  LDH     125 - 245 U/L 152  HCV Ab     0.0 - 0.9 s/co ratio <0.1  HIV Screen 4th Generation wRfx     Non Reactive Non Reactive  Hepatitis B Surface Ag     Negative Negative  Hep B Core Ab, Tot     Negative Negative    10/31/17 Tissue Flow Cytometry:   10/31/17 Lymph Node Needle/Core Biopsy:  11/10/17 Peripheral Blood Flow Cytometry:   11/10/17 FISH CLL Prognostic Panel:      RADIOGRAPHIC STUDIES: I have personally reviewed the radiological images as listed and agreed with the findings in the report.  08/28/17 MRI     No results found.  ASSESSMENT & PLAN:   61 y.o. male with  1. Recently diagnosed Small B-Cell Lymphoma /Chronic lymphocytic Leukemia.  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 Newly noted Microcytic anemia with drop in hgb from 11.9 to 9.1 with severe iron deficiency. Not primarily related to lymphoma. 3. Severe Iron deficiency - unclear etiology -- will need GI workup  PLAN:  -Discussed pt labwork today, 05/03/18; HGB at 9.2, 7% saturation ratio, Ferritin is at 5 -Will place IV Injectafer weekly x2  doses order again  -The pt shows no clinical or lab progression of CLL at this time.  -No indication for initiating active treatment at this time for her CLL. -Continue follow up with Dr. Owens Loffler in GI for colonoscopy   IV injectafer weekly x 2 doses ASAP RTC with Dr Irene Limbo in 2 months with labs   All of the patients questions were answered with apparent satisfaction. The patient knows to call the clinic with any problems, questions or concerns.  The total time spent in the appt was 25 minutes and more than 50% was on counseling and direct patient cares.    Sullivan Lone MD [facility] AAHIVMS [facility] Hematology/Oncology Physician [facility]  (Office):       [phone] (Work cell):  [phone] (Fax):  [phone]  05/03/2018 3:27 PM  I, Baldwin [place], am acting as a scribe for Dr. Irene Limbo  .I have reviewed the above documentation for accuracy and completeness, and I agree with the above. Brunetta Genera MD

## 2018-05-03 NOTE — Patient Instructions (Signed)
Make sure that you have dates placed for receiving IV Iron

## 2018-05-03 NOTE — Telephone Encounter (Signed)
Printed avs and calender of upcoming appointment. Per 9/12 los 

## 2018-05-09 ENCOUNTER — Encounter (HOSPITAL_COMMUNITY): Payer: Medicare HMO

## 2018-05-14 ENCOUNTER — Other Ambulatory Visit: Payer: Self-pay | Admitting: Otolaryngology

## 2018-05-15 ENCOUNTER — Encounter: Payer: Medicare HMO | Admitting: Gastroenterology

## 2018-05-15 NOTE — Pre-Procedure Instructions (Signed)
Rudell MADELINE BEBOUT  05/15/2018      [id number] [name], [address] AT [facility] OF [address] [place] [address] Phone: [phone] Fax: [phone]    Your procedure is scheduled on Sept. 27  Report to [place] at 6:45  A.M.  Call this number if you have problems the morning of surgery:  [phone]   Remember:  Do not eat or drink after midnight.      Take these medicines the morning of surgery with A SIP OF WATER :              Amlodipine (norvasc)             Oxycodone if needed                          7 days prior to surgery STOP taking any Aspirin(unless otherwise instructed by your surgeon), Aleve, Naproxen, Ibuprofen, Motrin, Advil, Goody's, BC's, all herbal medications, fish oil, and all vitamins           Follow your surgeon's instructions on when to stop Asprin.  If no instructions were given by your surgeon then you will need to call the office to get those instructions.                         How to Manage Your Diabetes Before and After Surgery  Why is it important to control my blood sugar before and after surgery? . Improving blood sugar levels before and after surgery helps healing and can limit problems. . A way of improving blood sugar control is eating a healthy diet by: o  Eating less sugar and carbohydrates o  Increasing activity/exercise o  Talking with your doctor about reaching your blood sugar goals . High blood sugars (greater than 180 mg/dL) can raise your risk of infections and slow your recovery, so you will need to focus on controlling your diabetes during the weeks before surgery. . Make sure that the doctor who takes care of your diabetes knows about your planned surgery including the date and location.  How do I manage my blood sugar before surgery? . Check your blood sugar at least 4 times a day, starting 2 days before surgery, to make sure that the  level is not too high or low. o Check your blood sugar the morning of your surgery when you wake up and every 2 hours until you get to the Short Stay unit. . If your blood sugar is less than 70 mg/dL, you will need to treat for low blood sugar: o Do not take insulin. o Treat a low blood sugar (less than 70 mg/dL) with  cup of clear juice (cranberry or apple), 4 glucose tablets, OR glucose gel. Recheck blood sugar in 15 minutes after treatment (to make sure it is greater than 70 mg/dL). If your blood sugar is not greater than 70 mg/dL on recheck, call [phone] o  for further instructions. . Report your blood sugar to the short stay nurse when you get to Short Stay.  . If you are admitted to the hospital after surgery: o Your blood sugar will be checked by the staff and you will probably be given insulin after surgery (instead of oral diabetes medicines) to make sure you have good blood sugar levels. o The goal for blood  sugar control after surgery is 80-180 mg/dL.       WHAT DO I DO ABOUT MY DIABETES MEDICATION?   [name] Do not take oral diabetes medicines (pills) the morning of surgery.      Do not wear jewelry.  Do not wear lotions, powders, or perfumes, or deodorant.  Do not shave 48 hours prior to surgery.  Men may shave face and neck.  Do not bring valuables to the hospital.  [facility] is not responsible for any belongings or valuables.  Contacts, dentures or bridgework may not be worn into surgery.  Leave your suitcase in the car.  After surgery it may be brought to your room.  For patients admitted to the hospital, discharge time will be determined by your treatment team.  Patients discharged the day of surgery will not be allowed to drive home.    Special instructions:   [place]- Preparing For Surgery  Before surgery, you can play an important role. Because skin is not sterile, your skin needs to be as free of germs as possible. You can reduce the number of germs  on your skin by washing with CHG (chlorahexidine gluconate) Soap before surgery.  CHG is an antiseptic cleaner which kills germs and bonds with the skin to continue killing germs even after washing.    Oral Hygiene is also important to reduce your risk of infection.  Remember - BRUSH YOUR TEETH THE MORNING OF SURGERY WITH YOUR REGULAR TOOTHPASTE  Please do not use if you have an allergy to CHG or antibacterial soaps. If your skin becomes reddened/irritated stop using the CHG.  Do not shave (including legs and underarms) for at least 48 hours prior to first CHG shower. It is OK to shave your face.  Please follow these instructions carefully.   1. Shower the NIGHT BEFORE SURGERY and the MORNING OF SURGERY with CHG.   2. If you chose to wash your hair, wash your hair first as usual with your normal shampoo.  3. After you shampoo, rinse your hair and body thoroughly to remove the shampoo.  4. Use CHG as you would any other liquid soap. You can apply CHG directly to the skin and wash gently with a scrungie or a clean washcloth.   5. Apply the CHG Soap to your body ONLY FROM THE NECK DOWN.  Do not use on open wounds or open sores. Avoid contact with your eyes, ears, mouth and genitals (private parts). Wash Face and genitals (private parts)  with your normal soap.  6. Wash thoroughly, paying special attention to the area where your surgery will be performed.  7. Thoroughly rinse your body with warm water from the neck down.  8. DO NOT shower/wash with your normal soap after using and rinsing off the CHG Soap.  9. Pat yourself dry with a CLEAN TOWEL.  10. Wear CLEAN PAJAMAS to bed the night before surgery, wear comfortable clothes the morning of surgery  11. Place CLEAN SHEETS on your bed the night of your first shower and DO NOT SLEEP WITH PETS.    Day of Surgery:  Do not apply any deodorants/lotions.  Please wear clean clothes to the hospital/surgery center.   Remember to brush your  teeth WITH YOUR REGULAR TOOTHPASTE.    Please read over the following fact sheets that you were given. Coughing and Deep Breathing and Surgical Site Infection Prevention

## 2018-05-16 ENCOUNTER — Encounter (HOSPITAL_COMMUNITY): Payer: Self-pay

## 2018-05-16 ENCOUNTER — Encounter (HOSPITAL_COMMUNITY)
Admission: RE | Admit: 2018-05-16 | Discharge: 2018-05-16 | Disposition: A | Discharge status: Home / Self Care | Payer: Medicare HMO | Source: Ambulatory Visit | Attending: Otolaryngology | Admitting: Otolaryngology

## 2018-05-16 ENCOUNTER — Encounter (HOSPITAL_COMMUNITY): Payer: Medicare HMO

## 2018-05-16 ENCOUNTER — Other Ambulatory Visit: Payer: Self-pay

## 2018-05-16 DIAGNOSIS — J3489 Other specified disorders of nose and nasal sinuses: Secondary | ICD-10-CM

## 2018-05-16 DIAGNOSIS — J343 Hypertrophy of nasal turbinates: Secondary | ICD-10-CM | POA: Insufficient documentation

## 2018-05-16 DIAGNOSIS — J342 Deviated nasal septum: Secondary | ICD-10-CM

## 2018-05-16 DIAGNOSIS — Z7982 Long term (current) use of aspirin: Secondary | ICD-10-CM | POA: Diagnosis not present

## 2018-05-16 DIAGNOSIS — R9431 Abnormal electrocardiogram [ECG] [EKG]: Secondary | ICD-10-CM | POA: Insufficient documentation

## 2018-05-16 DIAGNOSIS — Z79899 Other long term (current) drug therapy: Secondary | ICD-10-CM | POA: Insufficient documentation

## 2018-05-16 DIAGNOSIS — G4700 Insomnia, unspecified: Secondary | ICD-10-CM | POA: Diagnosis not present

## 2018-05-16 DIAGNOSIS — E119 Type 2 diabetes mellitus without complications: Secondary | ICD-10-CM | POA: Insufficient documentation

## 2018-05-16 DIAGNOSIS — I10 Essential (primary) hypertension: Secondary | ICD-10-CM | POA: Insufficient documentation

## 2018-05-16 DIAGNOSIS — Z791 Long term (current) use of non-steroidal anti-inflammatories (NSAID): Secondary | ICD-10-CM

## 2018-05-16 DIAGNOSIS — K219 Gastro-esophageal reflux disease without esophagitis: Secondary | ICD-10-CM | POA: Diagnosis not present

## 2018-05-16 DIAGNOSIS — Z87891 Personal history of nicotine dependence: Secondary | ICD-10-CM | POA: Diagnosis not present

## 2018-05-16 DIAGNOSIS — Z888 Allergy status to other drugs, medicaments and biological substances status: Secondary | ICD-10-CM | POA: Diagnosis not present

## 2018-05-16 DIAGNOSIS — G4730 Sleep apnea, unspecified: Secondary | ICD-10-CM | POA: Diagnosis not present

## 2018-05-16 DIAGNOSIS — E785 Hyperlipidemia, unspecified: Secondary | ICD-10-CM | POA: Diagnosis not present

## 2018-05-16 DIAGNOSIS — Z01818 Encounter for other preprocedural examination: Secondary | ICD-10-CM | POA: Insufficient documentation

## 2018-05-16 DIAGNOSIS — Z7984 Long term (current) use of oral hypoglycemic drugs: Secondary | ICD-10-CM

## 2018-05-16 DIAGNOSIS — Z887 Allergy status to serum and vaccine status: Secondary | ICD-10-CM | POA: Diagnosis not present

## 2018-05-16 HISTORY — DX: Pneumonia, unspecified organism: J18.9

## 2018-05-16 HISTORY — DX: Sleep apnea, unspecified: G47.30

## 2018-05-16 HISTORY — DX: Type 2 diabetes mellitus without complications: E11.9

## 2018-05-16 LAB — BASIC METABOLIC PANEL
Anion gap: 8 (ref 5–15)
BUN: 7 mg/dL — ABNORMAL LOW (ref 8–23)
CO2: 23 mmol/L (ref 22–32)
CREATININE: 1.07 mg/dL (ref 0.61–1.24)
Calcium: 9.2 mg/dL (ref 8.9–10.3)
Chloride: 108 mmol/L (ref 98–111)
GFR calc Af Amer: >60 mL/min (ref >60)
GLUCOSE: 109 mg/dL — AB (ref 70–99)
POTASSIUM: 3.9 mmol/L (ref 3.5–5.1)
Sodium: 139 mmol/L (ref 135–145)

## 2018-05-16 LAB — CBC
HEMATOCRIT: 31.2 % — AB (ref 39.0–52.0)
Hemoglobin: 9.6 g/dL — ABNORMAL LOW (ref 13.0–17.0)
MCH: 22.7 pg — ABNORMAL LOW (ref 26.0–34.0)
MCHC: 30.8 g/dL (ref 30.0–36.0)
MCV: 73.8 fL — AB (ref 78.0–100.0)
Platelets: 191 10*3/uL (ref 150–400)
RBC: 4.23 MIL/uL (ref 4.22–5.81)
RDW: 20.3 % — AB (ref 11.5–15.5)
WBC: 11.2 10*3/uL — ABNORMAL HIGH (ref 4.0–10.5)

## 2018-05-16 LAB — GLUCOSE, CAPILLARY: Glucose-Capillary: 87 mg/dL (ref 70–99)

## 2018-05-16 LAB — HEMOGLOBIN A1C
Hgb A1c MFr Bld: 6.2 % — ABNORMAL HIGH (ref 4.8–5.6)
MEAN PLASMA GLUCOSE: 131.24 mg/dL

## 2018-05-16 NOTE — Progress Notes (Signed)
PCP: Bernerd Limbo  DM: Type II  Does not check sugars @ home.  Check's sugars at doctor's office every 3-4 mo.  Sleep Apnea: yes, does not use or own CPAP Sleep Study - 1992  Echo: >10 years Stress: >10 years Cath: denies  Stopped ASA week of September 15th.    Per pt, does not have care 24 hours after surgery.  Shoemaker's office notified.

## 2018-05-16 NOTE — Progress Notes (Signed)
Pt. States he doesn't have anyone to stay with him the day of surgery if he is discharged home. Notified Mandy @ Dr. Victorio Palm office to give this information to Dr.shoemaker.

## 2018-05-16 NOTE — Pre-Procedure Instructions (Signed)
[name] [name]  05/16/2018      [facility] DR[facility]ORE #[id number] [name], [address] AT [facility] OF [place] [address] Phone: [phone] Fax: [phone]    Your procedure is scheduled on Sept. 27  Report to [place] at 6:45  A.M.  Call this number if you have problems the morning of surgery:  [phone]   Remember:  Do not eat or drink after midnight.      Take these medicines the morning of surgery with A SIP OF WATER :              Amlodipine (norvasc)             Oxycodone if needed                          7 days prior to surgery STOP taking any Aspirin(unless otherwise instructed by your surgeon), Aleve, Naproxen, Ibuprofen, Motrin, Advil, Goody's, BC's, all herbal medications, fish oil, and all vitamins                               How to Manage Your Diabetes Before and After Surgery  Why is it important to control my blood sugar before and after surgery? . Improving blood sugar levels before and after surgery helps healing and can limit problems. . A way of improving blood sugar control is eating a healthy diet by: o  Eating less sugar and carbohydrates o  Increasing activity/exercise o  Talking with your doctor about reaching your blood sugar goals . High blood sugars (greater than 180 mg/dL) can raise your risk of infections and slow your recovery, so you will need to focus on controlling your diabetes during the weeks before surgery. . Make sure that the doctor who takes care of your diabetes knows about your planned surgery including the date and location.  How do I manage my blood sugar before surgery? . Check your blood sugar at least 4 times a day, starting 2 days before surgery, to make sure that the level is not too high or low. o Check your blood sugar the morning of your surgery when you wake up and every 2 hours until you get to the Short Stay unit. . If your blood sugar  is less than 70 mg/dL, you will need to treat for low blood sugar: o Do not take insulin. o Treat a low blood sugar (less than 70 mg/dL) with  cup of clear juice (cranberry or apple), 4 glucose tablets, OR glucose gel. Recheck blood sugar in 15 minutes after treatment (to make sure it is greater than 70 mg/dL). If your blood sugar is not greater than 70 mg/dL on recheck, call [phone] o  for further instructions. . Report your blood sugar to the short stay nurse when you get to Short Stay.  . If you are admitted to the hospital after surgery: o Your blood sugar will be checked by the staff and you will probably be given insulin after surgery (instead of oral diabetes medicines) to make sure you have good blood sugar levels. o The goal for blood sugar control after surgery is 80-180 mg/dL.       WHAT DO I DO ABOUT MY DIABETES MEDICATION?  [name] Do not take oral diabetes medicines (pills) the morning of surgery.  Do not wear jewelry.  Do not wear lotions, powders, or perfumes, or deodorant.  Do not shave 48 hours prior to surgery.  Men may shave face and neck.  Do not bring valuables to the hospital.  [facility] is not responsible for any belongings or valuables.  Contacts, dentures or bridgework may not be worn into surgery.  Leave your suitcase in the car.  After surgery it may be brought to your room.  For patients admitted to the hospital, discharge time will be determined by your treatment team.  Patients discharged the day of surgery will not be allowed to drive home.    Special instructions:    [place]- Preparing For Surgery  Before surgery, you can play an important role. Because skin is not sterile, your skin needs to be as free of germs as possible. You can reduce the number of germs on your skin by washing with CHG (chlorahexidine gluconate) Soap before surgery.  CHG is an antiseptic cleaner which kills germs and bonds with the skin to continue killing germs  even after washing.    Oral Hygiene is also important to reduce your risk of infection.  Remember - BRUSH YOUR TEETH THE MORNING OF SURGERY WITH YOUR REGULAR TOOTHPASTE  Please do not use if you have an allergy to CHG or antibacterial soaps. If your skin becomes reddened/irritated stop using the CHG.  Do not shave (including legs and underarms) for at least 48 hours prior to first CHG shower. It is OK to shave your face.  Please follow these instructions carefully.   1. Shower the NIGHT BEFORE SURGERY and the MORNING OF SURGERY with CHG.   2. If you chose to wash your hair, wash your hair first as usual with your normal shampoo.  3. After you shampoo, rinse your hair and body thoroughly to remove the shampoo.  4. Use CHG as you would any other liquid soap. You can apply CHG directly to the skin and wash gently with a scrungie or a clean washcloth.   5. Apply the CHG Soap to your body ONLY FROM THE NECK DOWN.  Do not use on open wounds or open sores. Avoid contact with your eyes, ears, mouth and genitals (private parts). Wash Face and genitals (private parts)  with your normal soap.  6. Wash thoroughly, paying special attention to the area where your surgery will be performed.  7. Thoroughly rinse your body with warm water from the neck down.  8. DO NOT shower/wash with your normal soap after using and rinsing off the CHG Soap.  9. Pat yourself dry with a CLEAN TOWEL.  10. Wear CLEAN PAJAMAS to bed the night before surgery, wear comfortable clothes the morning of surgery  11. Place CLEAN SHEETS on your bed the night of your first shower and DO NOT SLEEP WITH PETS.    Day of Surgery:  Do not apply any deodorants/lotions.  Please wear clean clothes to the hospital/surgery center.   Remember to brush your teeth WITH YOUR REGULAR TOOTHPASTE.    Please read over the following fact sheets that you were given. Coughing and Deep Breathing and Surgical Site Infection  Prevention

## 2018-05-16 NOTE — Pre-Procedure Instructions (Signed)
[name] [name]  05/16/2018      [name] DR[facility] STORE #[id number] [name], [name] AT [facility] OF [name] [address] Phone: [phone] Fax: [phone]    Your procedure is scheduled on Sept. 27  Report to [place] at 6:45  A.M.  Call this number if you have problems the morning of surgery:  [phone]   Remember:  Do not eat or drink after midnight.      Take these medicines the morning of surgery with A SIP OF WATER :              Amlodipine (norvasc)             Oxycodone if needed                          7 days prior to surgery STOP taking any Aspirin(unless otherwise instructed by your surgeon), Aleve, Naproxen, Ibuprofen, Motrin, Advil, Goody's, BC's, all herbal medications, fish oil, and all vitamins                      WHAT DO I DO ABOUT MY DIABETES MEDICATION?  [name] Kitchen Do not take oral diabetes medicines (pills) the morning of surgery.      Do not wear jewelry.  Do not wear lotions, powders, or perfumes, or deodorant.  Do not shave 48 hours prior to surgery.  Men may shave face and neck.  Do not bring valuables to the hospital.  [facility] is not responsible for any belongings or valuables.  Contacts, dentures or bridgework may not be worn into surgery.  Leave your suitcase in the car.  After surgery it may be brought to your room.  For patients admitted to the hospital, discharge time will be determined by your treatment team.  Patients discharged the day of surgery will not be allowed to drive home.    Special instructions:    [place]- Preparing For Surgery  Before surgery, you can play an important role. Because skin is not sterile, your skin needs to be as free of germs as possible. You can reduce the number of germs on your skin by washing with CHG (chlorahexidine gluconate) Soap before surgery.  CHG is an antiseptic cleaner which kills germs and bonds with the skin  to continue killing germs even after washing.    Oral Hygiene is also important to reduce your risk of infection.  Remember - BRUSH YOUR TEETH THE MORNING OF SURGERY WITH YOUR REGULAR TOOTHPASTE  Please do not use if you have an allergy to CHG or antibacterial soaps. If your skin becomes reddened/irritated stop using the CHG.  Do not shave (including legs and underarms) for at least 48 hours prior to first CHG shower. It is OK to shave your face.  Please follow these instructions carefully.   1. Shower the NIGHT BEFORE SURGERY and the MORNING OF SURGERY with CHG.   2. If you chose to wash your hair, wash your hair first as usual with your normal shampoo.  3. After you shampoo, rinse your hair and body thoroughly to remove the shampoo.  4. Use CHG as you would any other liquid soap. You can apply CHG directly to the skin and wash gently with a scrungie or a clean washcloth.   5. Apply the CHG Soap to your body ONLY FROM THE NECK  DOWN.  Do not use on open wounds or open sores. Avoid contact with your eyes, ears, mouth and genitals (private parts). Wash Face and genitals (private parts)  with your normal soap.  6. Wash thoroughly, paying special attention to the area where your surgery will be performed.  7. Thoroughly rinse your body with warm water from the neck down.  8. DO NOT shower/wash with your normal soap after using and rinsing off the CHG Soap.  9. Pat yourself dry with a CLEAN TOWEL.  10. Wear CLEAN PAJAMAS to bed the night before surgery, wear comfortable clothes the morning of surgery  11. Place CLEAN SHEETS on your bed the night of your first shower and DO NOT SLEEP WITH PETS.    Day of Surgery:  Do not apply any deodorants/lotions.  Please wear clean clothes to the hospital/surgery center.   Remember to brush your teeth WITH YOUR REGULAR TOOTHPASTE.    Please read over the following fact sheets that you were given. Coughing and Deep Breathing and Surgical Site  Infection Prevention

## 2018-05-17 NOTE — Progress Notes (Signed)
Anesthesia Chart Review:  Case:  [id number] Date/Time:  05/18/18 0830   Procedures:      NASAL SEPTOPLASTY WITH TURBINATE REDUCTION (Bilateral )     ENDOSCOPIC CONCHA BULLOSA RESECTION (Bilateral )   Anesthesia type:  General   Pre-op diagnosis:  Nasal turbinate hypertrophy, Nasal obstruction and Deviated septum surgery   Location:  [facility] OR ROOM 09 / [place] OR   Surgeon:  Jerrell Belfast, MD      DISCUSSION: 62 yo male former smoker. Pertinent hx includes GERD,DMII, OSA not on CPAP, CLL.  Pt follows with Dr. Irene Limbo for recent diagnosis of CLL. Per his note 05/03/2018 no indication to initiate active treatment for CLL. Incidentally the pt was also found to have microcytic anemia with Hgb 9.1 and severe iron deficiency. Dr. Irene Limbo started pt on IV Injectafer weekly x2 doses. On PAT labs pt Hgb 9.6.  Anticipate he can proceed as planned barring acute status change.  VS: BP (!) 142/71   Pulse 75   Temp 36.7 C   Resp 18   Ht 5\' 7"  (1.702 m)   Wt 86 kg   SpO2 99%   BMI 29.71 kg/m   PROVIDERS: Bernerd Limbo, MD is PCP  Fabienne Bruns, MD is Oncologist  LABS: Labs reviewed: Acceptable for surgery. Iron deficiency anemia being addressed by Dr. Irene Limbo. I left a CM for Dr. Victorio Palm assistant to make him aware. (all labs ordered are listed, but only abnormal results are displayed)  Labs Reviewed  HEMOGLOBIN A1C - Abnormal; Notable for the following components:      Result Value   Hgb A1c MFr Bld 6.2 (*)    All other components within normal limits  BASIC METABOLIC PANEL - Abnormal; Notable for the following components:   Glucose, Bld 109 (*)    BUN 7 (*)    All other components within normal limits  CBC - Abnormal; Notable for the following components:   WBC 11.2 (*)    Hemoglobin 9.6 (*)    HCT 31.2 (*)    MCV 73.8 (*)    MCH 22.7 (*)    RDW 20.3 (*)    All other components within normal limits  GLUCOSE, CAPILLARY     IMAGES: TECHNIQUE: Standard 2 view chest 10/11/2017 COMPARISON:   None.  FINDINGS:   SUPPORT APPARATUS: N.A.   LUNGS/PLEURA: No pneumothorax. Clear lungs without focal opacity.  HEART/MEDIASTINUM: Normal cardiomediastinal silhouette.   MISC/CHRONIC: N.A.      IMPRESSION:  Normal.   EKG: 05/16/2018: Normal sinus rhythm 68bpm. Moderate voltage criteria for LVH, may be normal variant. Nonspecific T wave abnormality  CV: N/A  Past Medical History:  Diagnosis Date  . Allergic rhinitis, cause unspecified   . Backache, unspecified   . Body mass index 33.0-33.9, adult   . Diabetes mellitus without complication ([place])    Type II  . Elevated blood pressure reading without diagnosis of hypertension   . Esophageal reflux   . Generalized pain   . Heartburn   . Insomnia, unspecified   . 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 Other abnormal blood chemistry   . Other and unspecified hyperlipidemia   . Other dyspnea and respiratory abnormality   . Other nonspecific findings on examination of blood(790.99)   . Pain in joint, lower leg   . Pneumonia    1968  . Sleep apnea    Does not use or own a CPAP  . Tobacco use disorder   .  Unspecified essential hypertension     Past Surgical History:  Procedure Laterality Date  . bil wrist surgery    . ORIF FOREARM FRACTURE     right  . plastic surgery to face      MEDICATIONS: . amLODipine (NORVASC) 10 MG tablet  . aspirin EC 81 MG tablet  . atorvastatin (LIPITOR) 20 MG tablet  . cyclobenzaprine (FLEXERIL) 10 MG tablet  . losartan (COZAAR) 50 MG tablet  . meloxicam (MOBIC) 15 MG tablet  . metFORMIN (GLUCOPHAGE) 500 MG tablet  . oxyCODONE-acetaminophen (PERCOCET/ROXICET) 5-325 MG tablet  . pantoprazole (PROTONIX) 40 MG tablet  . polyethylene glycol-electrolytes (NULYTELY/GOLYTELY) 420 g solution  . potassium chloride SA (K-DUR,KLOR-CON) 20 MEQ tablet  . zolpidem (AMBIEN) 10 MG tablet   No current facility-administered medications for this  encounter.     Wynonia Musty [facility] Short Stay Center/Anesthesiology Phone [phone] 05/17/2018 8:49 AM

## 2018-05-18 ENCOUNTER — Other Ambulatory Visit: Payer: Self-pay

## 2018-05-18 ENCOUNTER — Encounter (HOSPITAL_COMMUNITY)
Admission: RE | Disposition: A | Discharge status: Home / Self Care | Payer: Self-pay | Source: Ambulatory Visit | Attending: Otolaryngology

## 2018-05-18 ENCOUNTER — Ambulatory Visit (HOSPITAL_COMMUNITY): Payer: Medicare HMO | Admitting: Physician Assistant

## 2018-05-18 ENCOUNTER — Ambulatory Visit (HOSPITAL_COMMUNITY): Payer: Medicare HMO | Admitting: Certified Registered Nurse Anesthetist

## 2018-05-18 ENCOUNTER — Ambulatory Visit (HOSPITAL_COMMUNITY)
Admission: RE | Admit: 2018-05-18 | Discharge: 2018-05-18 | Disposition: A | Discharge status: Home / Self Care | Payer: Medicare HMO | Source: Ambulatory Visit | Attending: Otolaryngology | Admitting: Otolaryngology

## 2018-05-18 ENCOUNTER — Encounter (HOSPITAL_COMMUNITY): Payer: Self-pay | Admitting: Certified Registered Nurse Anesthetist

## 2018-05-18 DIAGNOSIS — Z887 Allergy status to serum and vaccine status: Secondary | ICD-10-CM | POA: Insufficient documentation

## 2018-05-18 DIAGNOSIS — J343 Hypertrophy of nasal turbinates: Secondary | ICD-10-CM | POA: Diagnosis not present

## 2018-05-18 DIAGNOSIS — J3489 Other specified disorders of nose and nasal sinuses: Secondary | ICD-10-CM | POA: Diagnosis not present

## 2018-05-18 DIAGNOSIS — Z79899 Other long term (current) drug therapy: Secondary | ICD-10-CM | POA: Insufficient documentation

## 2018-05-18 DIAGNOSIS — E119 Type 2 diabetes mellitus without complications: Secondary | ICD-10-CM | POA: Diagnosis not present

## 2018-05-18 DIAGNOSIS — J342 Deviated nasal septum: Secondary | ICD-10-CM | POA: Diagnosis not present

## 2018-05-18 DIAGNOSIS — Z888 Allergy status to other drugs, medicaments and biological substances status: Secondary | ICD-10-CM | POA: Insufficient documentation

## 2018-05-18 DIAGNOSIS — Z7984 Long term (current) use of oral hypoglycemic drugs: Secondary | ICD-10-CM | POA: Insufficient documentation

## 2018-05-18 DIAGNOSIS — K219 Gastro-esophageal reflux disease without esophagitis: Secondary | ICD-10-CM | POA: Insufficient documentation

## 2018-05-18 DIAGNOSIS — Z87891 Personal history of nicotine dependence: Secondary | ICD-10-CM | POA: Insufficient documentation

## 2018-05-18 DIAGNOSIS — I10 Essential (primary) hypertension: Secondary | ICD-10-CM | POA: Insufficient documentation

## 2018-05-18 DIAGNOSIS — G4700 Insomnia, unspecified: Secondary | ICD-10-CM | POA: Insufficient documentation

## 2018-05-18 DIAGNOSIS — E785 Hyperlipidemia, unspecified: Secondary | ICD-10-CM | POA: Insufficient documentation

## 2018-05-18 DIAGNOSIS — G4730 Sleep apnea, unspecified: Secondary | ICD-10-CM | POA: Insufficient documentation

## 2018-05-18 DIAGNOSIS — Z7982 Long term (current) use of aspirin: Secondary | ICD-10-CM | POA: Insufficient documentation

## 2018-05-18 HISTORY — PX: SINUS ENDO W/FUSION: SHX777

## 2018-05-18 HISTORY — PX: NASAL SEPTOPLASTY W/ TURBINOPLASTY: SHX2070

## 2018-05-18 HISTORY — PX: ENDOSCOPIC CONCHA BULLOSA RESECTION: SHX6395

## 2018-05-18 LAB — GLUCOSE, CAPILLARY
GLUCOSE-CAPILLARY: 98 mg/dL (ref 70–99)
GLUCOSE-CAPILLARY: 151 mg/dL — AB (ref 70–99)

## 2018-05-18 SURGERY — SEPTOPLASTY, NOSE, WITH NASAL TURBINATE REDUCTION
Anesthesia: General | Site: Nose | Laterality: Bilateral

## 2018-05-18 MED ORDER — ONDANSETRON HCL 4 MG/2ML IJ SOLN
INTRAMUSCULAR | Status: DC | PRN
  Administered 2018-05-18: 4 mg via INTRAVENOUS

## 2018-05-18 MED ORDER — 0.9 % SODIUM CHLORIDE (POUR BTL) OPTIME
TOPICAL | Status: DC | PRN
  Administered 2018-05-18: 1000 mL

## 2018-05-18 MED ORDER — ROCURONIUM BROMIDE 50 MG/5ML IV SOSY
PREFILLED_SYRINGE | INTRAVENOUS | Status: AC
  Filled 2018-05-18: qty 5

## 2018-05-18 MED ORDER — MIDAZOLAM HCL 2 MG/2ML IJ SOLN
INTRAMUSCULAR | Status: AC
  Filled 2018-05-18: qty 2

## 2018-05-18 MED ORDER — MIDAZOLAM HCL 2 MG/2ML IJ SOLN
INTRAMUSCULAR | Status: DC | PRN
  Administered 2018-05-18: 2 mg via INTRAVENOUS

## 2018-05-18 MED ORDER — STERILE WATER FOR IRRIGATION IR SOLN
Status: DC | PRN
  Administered 2018-05-18: 1000 mL

## 2018-05-18 MED ORDER — OXYMETAZOLINE HCL 0.05 % NA SOLN
1.0000 | Freq: Two times a day (BID) | NASAL | Status: DC
  Administered 2018-05-18: 1 via NASAL

## 2018-05-18 MED ORDER — MUPIROCIN 2 % EX OINT
TOPICAL_OINTMENT | CUTANEOUS | Status: AC
  Filled 2018-05-18: qty 22

## 2018-05-18 MED ORDER — SODIUM CHLORIDE 0.9 % IR SOLN
Status: DC | PRN
  Administered 2018-05-18: 1000 mL

## 2018-05-18 MED ORDER — DEXAMETHASONE SODIUM PHOSPHATE 10 MG/ML IJ SOLN
INTRAMUSCULAR | Status: DC | PRN
  Administered 2018-05-18: 10 mg via INTRAVENOUS

## 2018-05-18 MED ORDER — LIDOCAINE-EPINEPHRINE 1 %-1:100000 IJ SOLN
INTRAMUSCULAR | Status: DC | PRN
  Administered 2018-05-18: 6 mL

## 2018-05-18 MED ORDER — CEPHALEXIN 500 MG PO CAPS: 500.0000 mg | ORAL_CAPSULE | Freq: Three times a day (TID) | ORAL | 1 refills | Status: AC

## 2018-05-18 MED ORDER — OXYMETAZOLINE HCL 0.05 % NA SOLN
NASAL | Status: AC
  Filled 2018-05-18: qty 30

## 2018-05-18 MED ORDER — LACTATED RINGERS IV SOLN
INTRAVENOUS | Status: DC
  Administered 2018-05-18 (×2): via INTRAVENOUS

## 2018-05-18 MED ORDER — CEFAZOLIN SODIUM-DEXTROSE 2-4 GM/100ML-% IV SOLN
INTRAVENOUS | Status: AC
  Filled 2018-05-18: qty 100

## 2018-05-18 MED ORDER — ACETAMINOPHEN 500 MG PO TABS
1000.0000 mg | ORAL_TABLET | Freq: Once | ORAL | Status: AC
  Administered 2018-05-18: 1000 mg via ORAL

## 2018-05-18 MED ORDER — FENTANYL CITRATE (PF) 250 MCG/5ML IJ SOLN
INTRAMUSCULAR | Status: AC
  Filled 2018-05-18: qty 5

## 2018-05-18 MED ORDER — PROPOFOL 10 MG/ML IV BOLUS
INTRAVENOUS | Status: AC
  Filled 2018-05-18: qty 20

## 2018-05-18 MED ORDER — LIDOCAINE-EPINEPHRINE 1 %-1:100000 IJ SOLN
INTRAMUSCULAR | Status: AC
  Filled 2018-05-18: qty 1

## 2018-05-18 MED ORDER — FENTANYL CITRATE (PF) 100 MCG/2ML IJ SOLN
INTRAMUSCULAR | Status: AC
  Filled 2018-05-18: qty 2

## 2018-05-18 MED ORDER — OXYMETAZOLINE HCL 0.05 % NA SOLN
NASAL | Status: DC | PRN
  Administered 2018-05-18: 1

## 2018-05-18 MED ORDER — DEXAMETHASONE SODIUM PHOSPHATE 10 MG/ML IJ SOLN: 10.0000 mg | Freq: Once | INTRAMUSCULAR | Status: DC

## 2018-05-18 MED ORDER — OXYMETAZOLINE HCL 0.05 % NA SOLN
NASAL | Status: AC
  Administered 2018-05-18: 1 via NASAL
  Filled 2018-05-18: qty 15

## 2018-05-18 MED ORDER — LIDOCAINE 2% (20 MG/ML) 5 ML SYRINGE
INTRAMUSCULAR | Status: AC
  Filled 2018-05-18: qty 5

## 2018-05-18 MED ORDER — CEFAZOLIN SODIUM-DEXTROSE 2-4 GM/100ML-% IV SOLN
2.0000 g | INTRAVENOUS | Status: AC
  Administered 2018-05-18: 2 g via INTRAVENOUS

## 2018-05-18 MED ORDER — DEXAMETHASONE SODIUM PHOSPHATE 10 MG/ML IJ SOLN
INTRAMUSCULAR | Status: AC
  Filled 2018-05-18: qty 1

## 2018-05-18 MED ORDER — FENTANYL CITRATE (PF) 100 MCG/2ML IJ SOLN
INTRAMUSCULAR | Status: DC | PRN
  Administered 2018-05-18 (×5): 50 ug via INTRAVENOUS

## 2018-05-18 MED ORDER — BACITRACIN ZINC 500 UNIT/GM EX OINT
TOPICAL_OINTMENT | CUTANEOUS | Status: AC
  Filled 2018-05-18: qty 28.35

## 2018-05-18 MED ORDER — BACITRACIN ZINC 500 UNIT/GM EX OINT
TOPICAL_OINTMENT | CUTANEOUS | Status: DC | PRN
  Administered 2018-05-18: 1 via TOPICAL

## 2018-05-18 MED ORDER — TRIAMCINOLONE ACETONIDE 40 MG/ML IJ SUSP
INTRAMUSCULAR | Status: AC
  Filled 2018-05-18: qty 5

## 2018-05-18 MED ORDER — ARTIFICIAL TEARS OPHTHALMIC OINT
TOPICAL_OINTMENT | OPHTHALMIC | Status: AC
  Filled 2018-05-18: qty 3.5

## 2018-05-18 MED ORDER — ACETAMINOPHEN 500 MG PO TABS
ORAL_TABLET | ORAL | Status: AC
  Administered 2018-05-18: 1000 mg via ORAL
  Filled 2018-05-18: qty 2

## 2018-05-18 MED ORDER — ROCURONIUM BROMIDE 50 MG/5ML IV SOSY
PREFILLED_SYRINGE | INTRAVENOUS | Status: DC | PRN
  Administered 2018-05-18: 50 mg via INTRAVENOUS

## 2018-05-18 MED ORDER — PROPOFOL 10 MG/ML IV BOLUS
INTRAVENOUS | Status: DC | PRN
  Administered 2018-05-18: 200 mg via INTRAVENOUS

## 2018-05-18 MED ORDER — HYDROCODONE-ACETAMINOPHEN 5-325 MG PO TABS: 1.0000 | ORAL_TABLET | Freq: Four times a day (QID) | ORAL | 0 refills | Status: AC | PRN

## 2018-05-18 MED ORDER — CHLORHEXIDINE GLUCONATE CLOTH 2 % EX PADS: 6.0000 | MEDICATED_PAD | Freq: Once | CUTANEOUS | Status: DC

## 2018-05-18 MED ORDER — PROMETHAZINE HCL 25 MG/ML IJ SOLN: 6.2500 mg | INTRAMUSCULAR | Status: DC | PRN

## 2018-05-18 MED ORDER — ONDANSETRON HCL 4 MG/2ML IJ SOLN
INTRAMUSCULAR | Status: AC
  Filled 2018-05-18: qty 2

## 2018-05-18 MED ORDER — SUGAMMADEX SODIUM 200 MG/2ML IV SOLN
INTRAVENOUS | Status: DC | PRN
  Administered 2018-05-18: 200 mg via INTRAVENOUS

## 2018-05-18 MED ORDER — FENTANYL CITRATE (PF) 100 MCG/2ML IJ SOLN
25.0000 ug | INTRAMUSCULAR | Status: DC | PRN
  Administered 2018-05-18: 25 ug via INTRAVENOUS

## 2018-05-18 MED ORDER — LIDOCAINE 2% (20 MG/ML) 5 ML SYRINGE
INTRAMUSCULAR | Status: DC | PRN
  Administered 2018-05-18: 100 mg via INTRAVENOUS

## 2018-05-18 SURGICAL SUPPLY — 30 items
BLADE ROTATE TRICUT 4MX13CM M4 (BLADE) ×1
BLADE ROTATE TRICUT 4X13 M4 (BLADE) ×2 IMPLANT
BLADE SURG 15 STRL LF DISP TIS (BLADE) ×1 IMPLANT
BLADE SURG 15 STRL SS (BLADE) ×2
CANISTER SUCT 3000ML PPV (MISCELLANEOUS) ×3 IMPLANT
COAGULATOR SUCT 8FR VV (MISCELLANEOUS) ×3 IMPLANT
DRAPE HALF SHEET 40X57 (DRAPES) IMPLANT
ELECT REM PT RETURN 9FT ADLT (ELECTROSURGICAL) ×3
ELECTRODE REM PT RTRN 9FT ADLT (ELECTROSURGICAL) ×1 IMPLANT
GAUZE SPONGE 2X2 8PLY STRL LF (GAUZE/BANDAGES/DRESSINGS) ×1 IMPLANT
GLOVE BIOGEL M 7.0 STRL (GLOVE) ×6 IMPLANT
GOWN STRL REUS W/ TWL LRG LVL3 (GOWN DISPOSABLE) ×2 IMPLANT
GOWN STRL REUS W/TWL LRG LVL3 (GOWN DISPOSABLE) ×4
KIT BASIN OR (CUSTOM PROCEDURE TRAY) ×3 IMPLANT
KIT TURNOVER KIT B (KITS) ×3 IMPLANT
NEEDLE HYPO 25GX1X1/2 BEV (NEEDLE) ×3 IMPLANT
NS IRRIG 1000ML POUR BTL (IV SOLUTION) ×3 IMPLANT
PAD ARMBOARD 7.5X6 YLW CONV (MISCELLANEOUS) ×3 IMPLANT
SPLINT NASAL DOYLE BI-VL (GAUZE/BANDAGES/DRESSINGS) ×3 IMPLANT
SPONGE GAUZE 2X2 STER 10/PKG (GAUZE/BANDAGES/DRESSINGS) ×2
SPONGE NEURO XRAY DETECT 1X3 (DISPOSABLE) ×3 IMPLANT
SUT ETHILON 3 0 PS 1 (SUTURE) ×3 IMPLANT
SUT PLAIN 4 0 ~~LOC~~ 1 (SUTURE) ×3 IMPLANT
TOWEL OR 17X24 6PK STRL BLUE (TOWEL DISPOSABLE) ×3 IMPLANT
TRACKER ENT INSTRUMENT (MISCELLANEOUS) ×3 IMPLANT
TRACKER ENT PATIENT (MISCELLANEOUS) ×3 IMPLANT
TRAY ENT MC OR (CUSTOM PROCEDURE TRAY) ×3 IMPLANT
TUBE SALEM SUMP 16 FR W/ARV (TUBING) ×3 IMPLANT
TUBING EXTENTION W/L.L. (IV SETS) ×6 IMPLANT
TUBING STRAIGHTSHOT EPS 5PK (TUBING) ×6 IMPLANT

## 2018-05-18 NOTE — Anesthesia Procedure Notes (Addendum)
Procedure Name: Intubation Date/Time: 05/18/2018 8:12 AM Performed by: Genelle Bal, CRNA Pre-anesthesia Checklist: Patient identified, Emergency Drugs available, Suction available and Patient being monitored Patient Re-evaluated:Patient Re-evaluated prior to induction Oxygen Delivery Method: Circle system utilized Preoxygenation: Pre-oxygenation with 100% oxygen Induction Type: IV induction Ventilation: Mask ventilation without difficulty and Oral airway inserted - appropriate to patient size Laryngoscope Size: Glidescope and 3 Grade View: Grade I Tube type: Oral Tube size: 7.5 mm Number of attempts: 1 Airway Equipment and Method: Stylet and Oral airway Placement Confirmation: ETT inserted through vocal cords under direct vision,  positive ETCO2 and breath sounds checked- equal and bilateral Secured at: 23 cm Tube secured with: Tape Dental Injury: Teeth and Oropharynx as per pre-operative assessment  Difficulty Due To: Difficulty was anticipated, Difficult Airway- due to large tongue and Difficult Airway- due to limited oral opening Comments: Elective glidescope intubation

## 2018-05-18 NOTE — H&P (Signed)
[name] [name] is an 62 y.o. male.   Chief Complaint: Nasal Obstruction HPI: Prog nasal obstruction  Past Medical History:  Diagnosis Date  . Allergic rhinitis, cause unspecified   . Backache, unspecified   . Body mass index 33.0-33.9, adult   . Diabetes mellitus without complication ([place])    Type II  . Elevated blood pressure reading without diagnosis of hypertension   . Esophageal reflux   . Generalized pain   . Heartburn   . Insomnia, unspecified   . 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 Other abnormal blood chemistry   . Other and unspecified hyperlipidemia   . Other dyspnea and respiratory abnormality   . Other nonspecific findings on examination of blood(790.99)   . Pain in joint, lower leg   . Pneumonia    1968  . Sleep apnea    Does not use or own a CPAP  . Tobacco use disorder   . Unspecified essential hypertension     Past Surgical History:  Procedure Laterality Date  . bil wrist surgery    . ORIF FOREARM FRACTURE     right  . plastic surgery to face      Family History  Problem Relation Age of Onset  . Diabetes Mother   . Hypertension Mother   . Cancer Paternal Uncle    Social History:  reports that he quit smoking about 5 years ago. His smoking use included cigars. He has a 4.00 pack-year smoking history. He has never used smokeless tobacco. He reports that he drinks alcohol. He reports that he does not use drugs.  Allergies:  Allergies  Allergen Reactions  . Lisinopril Itching  . Tuberculin Tests Swelling    Medications Prior to Admission  Medication Sig Dispense Refill  . amLODipine (NORVASC) 10 MG tablet Take 10 mg by mouth daily.    [name] aspirin EC 81 MG tablet Take 81 mg by mouth daily.    [name] atorvastatin (LIPITOR) 20 MG tablet Take 20 mg by mouth daily.    . cyclobenzaprine (FLEXERIL) 10 MG tablet Take 10 mg by mouth at bedtime.    [name] losartan (COZAAR) 50 MG tablet Take 50 mg by mouth daily.    .  meloxicam (MOBIC) 15 MG tablet Take 15 mg by mouth daily.    . metFORMIN (GLUCOPHAGE) 500 MG tablet Take 500 mg by mouth daily.    [name] oxyCODONE-acetaminophen (PERCOCET/ROXICET) 5-325 MG tablet Take 1 tablet by mouth 3 (three) times daily as needed for pain.  0  . pantoprazole (PROTONIX) 40 MG tablet Take 40 mg by mouth at bedtime.     . polyethylene glycol-electrolytes (NULYTELY/GOLYTELY) 420 g solution Take 4,000 mLs by mouth as directed. 4000 mL 0  . potassium chloride SA (K-DUR,KLOR-CON) 20 MEQ tablet Take 20 mEq by mouth daily.    [name] zolpidem (AMBIEN) 10 MG tablet Take 10 mg by mouth at bedtime.       Results for orders placed or performed during the hospital encounter of 05/18/18 (from the past 48 hour(s))  Glucose, capillary     Status: None   Collection Time: 05/18/18  7:02 AM  Result Value Ref Range   Glucose-Capillary 98 70 - 99 mg/dL   Comment 1 Notify RN    Comment 2 Document in Chart    No results found.  Review of Systems  Constitutional: Negative.   HENT: Positive for congestion.   Respiratory: Negative.   Cardiovascular: Negative.  Blood pressure (!) 142/84, pulse 71, temperature 97.6 F (36.4 C), temperature source Oral, resp. rate 18, height 5\' 7"  (1.702 m), weight 85.7 kg, SpO2 98 %. Physical Exam  Constitutional: He appears well-developed and well-nourished.  HENT:  Sev septal deviation  Neck: Normal range of motion. Neck supple.     Assessment/Plan Adm for OP septoplasty and IT reduction  Jerrell Belfast, MD 05/18/2018, 8:02 AM

## 2018-05-18 NOTE — Op Note (Signed)
Operative Note:  ENDOSCOPIC SINUS SURGERY WITH NAVIGATION    SEPTOPLASTY    INFERIOR TURBINATE REDUCTION  Patient: [name] [name]  Medical record number: [id number]  Date:05/18/2018  Pre-operative Indications: 1.  Bilateral Concha Bullosa     2.  Severe nasal septal deviation     3.  Bilateral inferior turbinate hypertrophy  Postoperative Indications: Same  Surgical Procedure: 1.  Bilateral endoscopic resection of concha bullosa    2.  Nasal septoplasty    3. Bilateral Inferior Turbinate Reduction  Anesthesia: GET  Surgeon: [name] [name], M.D.  Complications: None  EBL: 75 cc  Findings: Severe nasal septal deviation with complete left-sided airway obstruction.  Bilateral obstructing concha bullosa and bilateral inferior turbinate hypertrophy.  No nasal packing placed.  Bilateral Doyle nasal septal splints placed at the conclusion of the surgical procedure.   Brief History: The patient is a 62 y.o. male with a history of chronic sinusitis and turbinate hypertrophy. The patient has been on medical therapy to reduce nasal mucosal edema and infection including antibiotics, saline nasal spray and topical nasal steroids. Despite appropriate medical therapy the patient continues to have ongoing symptoms. Given the patient's history and findings, the above surgical procedures were recommended, risks and benefits were discussed in detail with the patient may understand and agree with our plan for surgery which is scheduled at [facility] under general anesthesia as an outpatient.  Surgical Procedure: The patient is brought to the operating room on 05/18/2018 and placed in supine position on the operating table. General endotracheal anesthesia was established without difficulty. When the patient was adequately anesthetized, surgical timeout was performed with correct identification of the patient and the surgical procedure. The patient's nose was then injected with 6 cc of 1% lidocaine  1:100,000 dilution epinephrine which was injected in a submucosal fashion. The patient's nose was then packed with Afrin-soaked cottonoid pledgets were left in place for approximately 10 minutes to allow for  vasoconstriction and hemostasis.  The Xomed Fusion navigation headgear was applied in anatomic and surgical landmarks were identified and confirmed, navigation was used throughout the sinus component of the surgical procedure.  With the patient prepped draped and prepared for surgery, nasal nasal endoscopy was performed on the right side.  The patient had a large obstructing middle turbinate concha bullosum.  Using a sickle knife a vertically oriented incision was made through the anterior and inferior mucosa of the concha bullosum.  Using straight and curved endoscopic scissors the lateral wall of the concha bullosa was resected preserving the medial aspect of the middle turbinate.  There was arterial bleeding at the base of the middle turbinate which was cauterized with monopolar suction cautery set at 24 W.  The middle meatus was then carefully inspected, no evidence of infection, polyp or obstruction, no active bleeding.   The patient's left side was then inspected.  He had a large concha bullosa with obstruction of the nasal passageway.  This was treated by creating a vertically oriented incision in the mucosa of the concha bullosa him under direct visualization with a 0 degree endoscope.  Curved and straight endoscopic scissors were then used to resect the lateral component of the concha bullosa preserving the medial aspect and the major of the middle turbinate.  Again there is a moderate amount of bleeding along the posterior inferior aspect of the middle turbinate which was cauterized with monopolar suction cautery under direct visualization.   A left anterior hemitransfixion incision was created and a mucoperichondrial flap  was elevated from anterior to posterior on the left-hand side. The  anterior cartilaginous septum was crossed at the midline and a mucoperichondrial flap was elevated on the patient's right.  Swivel knife was then used to resect the anterior and mid cartilaginous portion of the nasal septum.  Resected cartilage was morcellized and returned to the mucoperichondrial pocket at the occlusion of the surgical procedure.  Dissection was then carried out from anterior to posterior removing deviated bone and cartilage including a large septal spur the overlying mucosa was preserved.  With the septum brought to good midline position, the morselized cartilage was returned to the mucoperichondrial pocket and the soft tissue/mucosal flaps were reapproximated with interrupted 4-0 gut suture on a Keith needle in a horizontal mattressing fashion.  Anterior hemitransfixion incision was closed with the same stitch.  Bilateral Doyle nasal septal splints were then placed after the application of Bactroban ointment and sutured in position with a 3-0 Ethilon suture.  Attention was then turned to the inferior turbinates, bilateral inferior turbinate intramural cautery was performed with cautery setting at 59 W.  2 submucosal passes were made in each inferior turbinate.  After completing cautery, anterior vertical incisions were created and overlying soft tissue was elevated, a small amount of turbinate bone was resected.  The turbinates were then outfractured to create a more patent nasal passageway.  No nasal packing was placed.  Surgical sponge count was correct. An oral gastric tube was passed and the stomach contents were aspirated. Patient was awakened from anesthetic and transferred from the operating room to the recovery room in stable condition. There were no complications and blood loss was 75 cc.   [name] [name], M.D. [facility] ENT 05/18/2018

## 2018-05-18 NOTE — Anesthesia Preprocedure Evaluation (Addendum)
Anesthesia Evaluation  Patient identified by MRN, date of birth, ID band Patient awake    Reviewed: Allergy & Precautions, NPO status , Patient's Chart, lab work & pertinent test results  Airway Mallampati: III  TM Distance: >3 FB Neck ROM: Full    Dental  (+) Teeth Intact, Dental Advisory Given   Pulmonary sleep apnea , former smoker,    Pulmonary exam normal breath sounds clear to auscultation       Cardiovascular hypertension, Pt. on medications Normal cardiovascular exam Rhythm:Regular Rate:Normal     Neuro/Psych negative neurological ROS     GI/Hepatic Neg liver ROS, GERD  Medicated,  Endo/Other  diabetes, Type 2, Oral Hypoglycemic Agents  Renal/GU negative Renal ROS     Musculoskeletal negative musculoskeletal ROS (+)   Abdominal   Peds  Hematology  (+) Blood dyscrasia, anemia ,   Anesthesia Other Findings Day of surgery medications reviewed with the patient.  Nasal turbinate hypertrophy, Nasal obstruction and Deviated septum surgery  Reproductive/Obstetrics                            Anesthesia Physical Anesthesia Plan  ASA: II  Anesthesia Plan: General   Post-op Pain Management:    Induction: Intravenous  PONV Risk Score and Plan: 4 or greater and Midazolam, Dexamethasone, Ondansetron and Diphenhydramine  Airway Management Planned: Oral ETT  Additional Equipment:   Intra-op Plan:   Post-operative Plan: Extubation in OR  Informed Consent: I have reviewed the patients History and Physical, chart, labs and discussed the procedure including the risks, benefits and alternatives for the proposed anesthesia with the patient or authorized representative who has indicated his/her understanding and acceptance.   Dental advisory given  Plan Discussed with: CRNA  Anesthesia Plan Comments:         Anesthesia Quick Evaluation

## 2018-05-18 NOTE — Transfer of Care (Signed)
Immediate Anesthesia Transfer of Care Note  Patient: Donis ZAYVEON RASCHKE  Procedure(s) Performed: NASAL SEPTOPLASTY WITH TURBINATE REDUCTION (Bilateral Nose) ENDOSCOPIC CONCHA BULLOSA RESECTION (Bilateral Nose) ENDOSCOPIC SINUS SURGERY WITH NAVIGATION (Bilateral Nose)  Patient Location: PACU  Anesthesia Type:General  Level of Consciousness: awake, alert  and oriented  Airway & Oxygen Therapy: Patient Spontanous Breathing and Patient connected to face mask oxygen  Post-op Assessment: Report given to RN and Post -op Vital signs reviewed and stable  Post vital signs: Reviewed and stable  Last Vitals:  Vitals Value Taken Time  BP 188/93 05/18/2018  9:35 AM  Temp    Pulse 65 05/18/2018  9:35 AM  Resp 19 05/18/2018  9:35 AM  SpO2 100 % 05/18/2018  9:35 AM  Vitals shown include unvalidated device data.  Last Pain:  Vitals:   05/18/18 0730  TempSrc:   PainSc: 0-No pain         Complications: No apparent anesthesia complications

## 2018-05-19 NOTE — Anesthesia Postprocedure Evaluation (Signed)
Anesthesia Post Note  Patient: AMMAN BARTEL  Procedure(s) Performed: NASAL SEPTOPLASTY WITH TURBINATE REDUCTION (Bilateral Nose) ENDOSCOPIC CONCHA BULLOSA RESECTION (Bilateral Nose) ENDOSCOPIC SINUS SURGERY WITH NAVIGATION (Bilateral Nose)     Patient location during evaluation: PACU Anesthesia Type: General Level of consciousness: awake and alert, awake and oriented Pain management: pain level controlled Vital Signs Assessment: post-procedure vital signs reviewed and stable Respiratory status: spontaneous breathing, nonlabored ventilation and respiratory function stable Cardiovascular status: blood pressure returned to baseline and stable Postop Assessment: no apparent nausea or vomiting Anesthetic complications: no    Last Vitals:  Vitals:   05/18/18 1038 05/18/18 1040  BP: (!) 167/92 (!) 160/86  Pulse: 64 68  Resp:    Temp:    SpO2: 97% 100%    Last Pain:  Vitals:   05/18/18 1015  TempSrc:   PainSc: Asleep                 Catalina Gravel

## 2018-05-20 ENCOUNTER — Encounter (HOSPITAL_COMMUNITY): Payer: Self-pay | Admitting: Otolaryngology

## 2018-06-26 ENCOUNTER — Telehealth: Payer: Self-pay | Admitting: Hematology

## 2018-06-26 NOTE — Telephone Encounter (Signed)
GK out 11/7 - per [place] moved appointments three weeks out. Spoke with patient re lab/fu 11/25.

## 2018-06-28 ENCOUNTER — Ambulatory Visit: Payer: Medicare HMO | Admitting: Hematology

## 2018-06-28 ENCOUNTER — Other Ambulatory Visit: Payer: Medicare HMO

## 2018-07-16 ENCOUNTER — Inpatient Hospital Stay: Payer: Medicare HMO | Attending: Hematology

## 2018-07-16 ENCOUNTER — Telehealth: Payer: Self-pay | Admitting: Hematology

## 2018-07-16 ENCOUNTER — Inpatient Hospital Stay (HOSPITAL_BASED_OUTPATIENT_CLINIC_OR_DEPARTMENT_OTHER): Payer: Medicare HMO | Admitting: Hematology

## 2018-07-16 ENCOUNTER — Encounter: Payer: Self-pay | Admitting: Hematology

## 2018-07-16 VITALS — BP 152/73 | HR 81 | Temp 98.2°F | Resp 17 | Ht 67.0 in | Wt 186.7 lb

## 2018-07-16 DIAGNOSIS — D509 Iron deficiency anemia, unspecified: Secondary | ICD-10-CM | POA: Insufficient documentation

## 2018-07-16 DIAGNOSIS — F17290 Nicotine dependence, other tobacco product, uncomplicated: Secondary | ICD-10-CM | POA: Diagnosis not present

## 2018-07-16 DIAGNOSIS — C9110 Chronic lymphocytic leukemia of B-cell type not having achieved remission: Secondary | ICD-10-CM | POA: Insufficient documentation

## 2018-07-16 DIAGNOSIS — C8308 Small cell B-cell lymphoma, lymph nodes of multiple sites: Secondary | ICD-10-CM

## 2018-07-16 DIAGNOSIS — Z79899 Other long term (current) drug therapy: Secondary | ICD-10-CM | POA: Diagnosis not present

## 2018-07-16 DIAGNOSIS — D500 Iron deficiency anemia secondary to blood loss (chronic): Secondary | ICD-10-CM

## 2018-07-16 LAB — CMP (CANCER CENTER ONLY)
ANION GAP: 7 (ref 5–15)
AST: 10 U/L — ABNORMAL LOW (ref 15–41)
Albumin: 4.1 g/dL (ref 3.5–5.0)
Alkaline Phosphatase: 106 U/L (ref 38–126)
BUN: 11 mg/dL (ref 8–23)
CHLORIDE: 110 mmol/L (ref 98–111)
CO2: 23 mmol/L (ref 22–32)
Calcium: 9.2 mg/dL (ref 8.9–10.3)
Creatinine: 1.23 mg/dL (ref 0.61–1.24)
GFR, Estimated: >60 mL/min (ref >60)
Glucose, Bld: 145 mg/dL — ABNORMAL HIGH (ref 70–99)
POTASSIUM: 4.4 mmol/L (ref 3.5–5.1)
SODIUM: 140 mmol/L (ref 135–145)
Total Bilirubin: 0.8 mg/dL (ref 0.3–1.2)
Total Protein: 7.2 g/dL (ref 6.5–8.1)

## 2018-07-16 LAB — CBC WITH DIFFERENTIAL/PLATELET
Abs Immature Granulocytes: 0 10*3/uL (ref 0.00–0.07)
BASOS ABS: 0 10*3/uL (ref 0.0–0.1)
Basophils Relative: 0 %
Eosinophils Absolute: 0.1 10*3/uL (ref 0.0–0.5)
Eosinophils Relative: 1 %
HEMATOCRIT: 31.4 % — AB (ref 39.0–52.0)
HEMOGLOBIN: 10 g/dL — AB (ref 13.0–17.0)
LYMPHS ABS: 9.4 10*3/uL — AB (ref 0.7–4.0)
LYMPHS PCT: 71 %
MCH: 24.4 pg — ABNORMAL LOW (ref 26.0–34.0)
MCHC: 31.8 g/dL (ref 30.0–36.0)
MCV: 76.6 fL — ABNORMAL LOW (ref 80.0–100.0)
Monocytes Absolute: 0.3 10*3/uL (ref 0.1–1.0)
Monocytes Relative: 2 %
NEUTROS ABS: 3.4 10*3/uL (ref 1.7–17.7)
NEUTROS PCT: 26 %
Platelets: 227 10*3/uL (ref 150–400)
RBC: 4.1 MIL/uL — AB (ref 4.22–5.81)
RDW: 19 % — ABNORMAL HIGH (ref 11.5–15.5)
WBC: 13.2 10*3/uL — AB (ref 4.0–10.5)
nRBC: 0 % (ref 0.0–0.2)

## 2018-07-16 LAB — LACTATE DEHYDROGENASE: LDH: 151 U/L (ref 98–192)

## 2018-07-16 LAB — IRON AND TIBC
Iron: 58 ug/dL (ref 42–163)
SATURATION RATIOS: 13 % — AB (ref 20–55)
TIBC: 440 ug/dL — ABNORMAL HIGH (ref 202–409)
UIBC: 382 ug/dL — AB (ref 117–376)

## 2018-07-16 LAB — FERRITIN: FERRITIN: 6 ng/mL — AB (ref 24–336)

## 2018-07-16 NOTE — Progress Notes (Signed)
HEMATOLOGY/ONCOLOGY CLINIC NOTE  Date of Service:  07/16/18   Patient Care Team: Bernerd Limbo, MD as PCP - General (Family Medicine)  CHIEF COMPLAINTS/PURPOSE OF CONSULTATION:  Small B-Cell Lymphoma  HISTORY OF PRESENTING ILLNESS:   [name] [name] is a wonderful 62 y.o. 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 The pt reports that he is doing well overall.   The pt reports that in August 2018 he felt a knot on the left side of his neck, which he spoke about with his PCP Dr. Coletta Memos. He then had an MRI in January 2019, a CXR in February 2019 and then a biopsy on 10/26/17 (results noted below). He denies any constitutional symptoms as noted below. He notes that the swelling on his neck has not increased in size and is not painful.   He notes that he gets congested at night and is only able to breathe out of one nostril; he denies having a deviated septum. He notes that he has tried to have this worked up for the last 4 years without success and has tried saline sprays without success. He notes that he wakes up with dry mouth.   The pt takes Amlodipine, 81mg  Aspirin, Atorvastatin, Cyclobenzaprine, Ferrousul, Losartan Potassium, Meloxicam, Metformin 500mg , Percocet, Protonix, Potassium Chloride, and Ambein.   Of note prior to the patient's visit today, pt has had Lymph node, needle/core biopsy, Left Supraclavicular completed on 10/31/17 with results revealing SMALL LYMPHOCYTIC LYMPHOMA.  10/26/17 Tissue Flow Cytometry revealed a Monoclonal B Cell population identified.    08/28/17 MRI revealed Extensive cervical adenopathy. Adenopathy also noted in the subpectoral nodes.   CXR on 10/11/17 was revealed to be Normal.   His 10/11/17 CBC revealed all values WNL except for % Lymph at 66.3%, Mono % at 3.3% and % Gran at 30.4%, Lymphs Abs at 6.4k, Hgb at 12.5, HCT at 37.5, MCV at 75.5, MCH at 25.1, RDW at 16.3, MPV at 6.4  On review of  systems, pt reports left cervical lymph node swelling and denies fevers, chills, night sweats, unexpected weight loss, CP, SOB, abdominal pain or swelling, pain along the spine, changes in bowel habits, skin rashes, leg swelling, and any other symptoms.    On PMHx the pt reports Hyperlipidemia, Hypertensive reitonpathy, Hypopotassemia, DM Type 2, acid reflux, spinal stenosis.  On Social Hx the pt reports being a former smoker, having quit in 2012. He notes that he smoked about 5 cigars per week before quitting. He denies consuming much ETOH. He denies any recreational drug use. He denies any unsafe needle exposure.  On Family Hx the pt reports an uncle who had cancer but wasn't sure what kind.  Interval History:  Mr. [name] [name] returns today regarding his Small B Cell Lymphoma. The patient's last visit with [place] was on 05/03/18. The pt reports that he is doing well overall.   The pt has not developed any fevers, chills, or night sweats. He has not noticed any progression in his existing enlarged lymph nodes and hasn't noticed any new lumps or bumps.   The pt reports that he saw his GI a month ago, but due to circumstances beyond his control, was not able to obtain the colonoscopy. He denies constipation, blood in the stools, black stools, and abdominal pains.   Unfortunately, the pt was not able to schedule IV Iron as well. He notes that he does have good energy levels.  The pt notes that he has had occasional nose bleeds. He had a nasal septoplasty with turbinate reduction on 05/18/18. He notes that he has been using nasal spray and can breathe much better.   Lab results today (07/16/18) of CBC w/diff, CMP, and Reticulocytes is as follows: all values are WNL except for WBC at 13.2k, RBC at 4.10, HGB at 10.0, HCT at 31.4, MCV at 76.6, MCH at 24.4, RDW at 19.0, Lymphs abs at 9.4, Glucose at 145, AST at 10. 07/16/18 Iron and TIBC revealed all values WNL except for TIBC at 440, 13% Saturation ratio,  UIBC at 382 07/16/18 LDH at 151 07/16/18 Ferritin at 6  On review of systems, pt reports good energy levels, breathing better, occasional nose bleeds, stable weight, and denies constipation, blood in the stools, black stools, abdominal pains, fevers, chills, night sweats, unexpected weight loss, noticing any new lumps or bumps, and any other symptoms.    MEDICAL HISTORY:  Past Medical History:  Diagnosis Date  . Allergic rhinitis, cause unspecified   . Backache, unspecified   . Body mass index 33.0-33.9, adult   . Diabetes mellitus without complication ([place])    Type II  . Elevated blood pressure reading without diagnosis of hypertension   . Esophageal reflux   . Generalized pain   . Heartburn   . Insomnia, unspecified   . 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 Other abnormal blood chemistry   . Other and unspecified hyperlipidemia   . Other dyspnea and respiratory abnormality   . Other nonspecific findings on examination of blood(790.99)   . Pain in joint, lower leg   . Pneumonia    1968  . Sleep apnea    Does not use or own a CPAP  . Tobacco use disorder   . Unspecified essential hypertension     SURGICAL HISTORY: Past Surgical History:  Procedure Laterality Date  . bil wrist surgery    . ENDOSCOPIC CONCHA BULLOSA RESECTION Bilateral 05/18/2018   Procedure: ENDOSCOPIC CONCHA BULLOSA RESECTION;  Surgeon: Jerrell Belfast, MD;  Location: [place];  Service: ENT;  Laterality: Bilateral;  . NASAL SEPTOPLASTY W/ TURBINOPLASTY Bilateral 05/18/2018   Procedure: NASAL SEPTOPLASTY WITH TURBINATE REDUCTION;  Surgeon: Jerrell Belfast, MD;  Location: [place];  Service: ENT;  Laterality: Bilateral;  . ORIF FOREARM FRACTURE     right  . plastic surgery to face    . SINUS ENDO W/FUSION Bilateral 05/18/2018   Procedure: ENDOSCOPIC SINUS SURGERY WITH NAVIGATION;  Surgeon: Jerrell Belfast, MD;  Location: [place];  Service: ENT;  Laterality:  Bilateral;    SOCIAL HISTORY: Social History   Socioeconomic History  . Marital status: Legally Separated    Spouse name: Not on file  . Number of children: 1  . Years of education: Not on file  . Highest education level: Not on file  Occupational History  . Not on file  Social Needs  . Financial resource strain: Not on file  . Food insecurity:    Worry: Not on file    Inability: Not on file  . Transportation needs:    Medical: Not on file    Non-medical: Not on file  Tobacco Use  . Smoking status: Former Smoker    Packs/day: 0.20    Years: 20.00    Pack years: 4.00    Types: Cigars    Last attempt to quit: 2014    Years since quitting: 5.9  . Smokeless tobacco: Never Used  Substance and Sexual Activity  . Alcohol use: Yes    Comment: 2 40oz beers a week  . Drug use: No  . Sexual activity: Not on file  Lifestyle  . Physical activity:    Days per week: Not on file    Minutes per session: Not on file  . Stress: Not on file  Relationships  . Social connections:    Talks on phone: Not on file    Gets together: Not on file    Attends religious service: Not on file    Active member of club or organization: Not on file    Attends meetings of clubs or organizations: Not on file    Relationship status: Not on file  . Intimate partner violence:    Fear of current or ex partner: Not on file    Emotionally abused: Not on file    Physically abused: Not on file    Forced sexual activity: Not on file  Other Topics Concern  . Not on file  Social History Narrative  . Not on file    FAMILY HISTORY: Family History  Problem Relation Age of Onset  . Diabetes Mother   . Hypertension Mother   . Cancer Paternal Uncle     ALLERGIES:  is allergic to lisinopril and tuberculin tests.  MEDICATIONS:  Current Outpatient Medications  Medication Sig Dispense Refill  . amLODipine (NORVASC) 10 MG tablet Take 10 mg by mouth daily.    [name] atorvastatin (LIPITOR) 20 MG tablet Take 20  mg by mouth daily.    . cyclobenzaprine (FLEXERIL) 10 MG tablet Take 10 mg by mouth at bedtime.    [name] losartan (COZAAR) 50 MG tablet Take 50 mg by mouth daily.    . meloxicam (MOBIC) 15 MG tablet Take 15 mg by mouth daily.    . metFORMIN (GLUCOPHAGE) 500 MG tablet Take 500 mg by mouth daily.    [name] oxyCODONE-acetaminophen (PERCOCET/ROXICET) 5-325 MG tablet Take 1 tablet by mouth 3 (three) times daily as needed for pain.  0  . pantoprazole (PROTONIX) 40 MG tablet Take 40 mg by mouth at bedtime.     . polyethylene glycol-electrolytes (NULYTELY/GOLYTELY) 420 g solution Take 4,000 mLs by mouth as directed. 4000 mL 0  . potassium chloride SA (K-DUR,KLOR-CON) 20 MEQ tablet Take 20 mEq by mouth daily.    [name] zolpidem (AMBIEN) 10 MG tablet Take 10 mg by mouth at bedtime.      No current facility-administered medications for this visit.     REVIEW OF SYSTEMS:    A 10+ POINT REVIEW OF SYSTEMS WAS OBTAINED including neurology, dermatology, psychiatry, cardiac, respiratory, lymph, extremities, GI, GU, Musculoskeletal, constitutional, breasts, reproductive, HEENT.  All pertinent positives are noted in the HPI.  All others are negative.   PHYSICAL EXAMINATION: ECOG PERFORMANCE STATUS: 1 - Symptomatic but completely ambulatory  . Vitals:   07/16/18 1314  BP: (!) 152/73  Pulse: 81  Resp: 17  Temp: 98.2 F (36.8 C)  SpO2: 99%   Filed Weights   07/16/18 1314  Weight: 186 lb 11.2 oz (84.7 kg)   .Body mass index is 29.24 kg/m.  GENERAL:alert, in no acute distress and comfortable SKIN: no acute rashes, no significant lesions EYES: conjunctiva are pink and non-injected, sclera anicteric OROPHARYNX: MMM, no exudates, no oropharyngeal erythema or ulceration NECK: supple, no JVD LYMPH: Small palpable cervical LNadenopatphy, bilateral axillary lymphadenopathy, no palpable lymphadenopathy in the inguinal region LUNGS: clear to auscultation b/l with normal respiratory effort HEART: regular  rate &  rhythm ABDOMEN:  normoactive bowel sounds , non tender, not distended. Spleen just palpable about 1 fingerbreadth until left costal margin.  Extremity: no pedal edema PSYCH: alert & oriented x 3 with fluent speech NEURO: no focal motor/sensory deficits    LABORATORY DATA:  I have reviewed the data as listed  . CBC Latest Ref Rng & Units 07/16/2018 05/16/2018 05/03/2018  WBC 4.0 - 10.5 K/uL 13.2(H) 11.2(H) 10.9(H)  Hemoglobin 13.0 - 17.0 g/dL 10.0(L) 9.6(L) 9.2(L)  Hematocrit 39.0 - 52.0 % 31.4(L) 31.2(L) 29.6(L)  Platelets 150 - 400 K/uL 227 191 240    CBC    Component Value Date/Time   WBC 13.2 (H) 07/16/2018 1235   RBC 4.10 (L) 07/16/2018 1235   HGB 10.0 (L) 07/16/2018 1235   HGB 11.9 (L) 11/10/2017 1008   HCT 31.4 (L) 07/16/2018 1235   PLT 227 07/16/2018 1235   PLT 228 11/10/2017 1008   MCV 76.6 (L) 07/16/2018 1235   MCH 24.4 (L) 07/16/2018 1235   MCHC 31.8 07/16/2018 1235   RDW 19.0 (H) 07/16/2018 1235   LYMPHSABS 9.4 (H) 07/16/2018 1235   MONOABS 0.3 07/16/2018 1235   EOSABS 0.1 07/16/2018 1235   BASOSABS 0.0 07/16/2018 1235    CMP Latest Ref Rng & Units 07/16/2018 05/16/2018 05/03/2018  Glucose 70 - 99 mg/dL 145(H) 109(H) 134(H)  BUN 8 - 23 mg/dL 11 7(L) 11  Creatinine 0.61 - 1.24 mg/dL 1.23 1.07 1.24  Sodium 135 - 145 mmol/L 140 139 138  Potassium 3.5 - 5.1 mmol/L 4.4 3.9 4.1  Chloride 98 - 111 mmol/L 110 108 108  CO2 22 - 32 mmol/L 23 23 23   Calcium 8.9 - 10.3 mg/dL 9.2 9.2 9.0  Total Protein 6.5 - 8.1 g/dL 7.2 - 7.1  Total Bilirubin 0.3 - 1.2 mg/dL 0.8 - 0.6  Alkaline Phos 38 - 126 U/L 106 - 106  AST 15 - 41 U/L 10(L) - 11(L)  ALT 0 - 44 U/L <6 - 6   . Lab Results  Component Value Date   IRON 58 07/16/2018   TIBC 440 (H) 07/16/2018   IRONPCTSAT 13 (L) 07/16/2018   (Iron and TIBC)  Lab Results  Component Value Date   FERRITIN 6 (L) 07/16/2018    Component     Latest Ref Rng & Units 11/10/2017  Retic Ct Pct     0.8 - 1.8 % 1.3  RBC.     4.20 -  5.82 MIL/uL 4.75  Retic Count, Absolute     34.8 - 93.9 K/uL 61.8  LDH     125 - 245 U/L 152  HCV Ab     0.0 - 0.9 s/co ratio <0.1  HIV Screen 4th Generation wRfx     Non Reactive Non Reactive  Hepatitis B Surface Ag     Negative Negative  Hep B Core Ab, Tot     Negative Negative    10/31/17 Tissue Flow Cytometry:   10/31/17 Lymph Node Needle/Core Biopsy:  11/10/17 Peripheral Blood Flow Cytometry:   11/10/17 FISH CLL Prognostic Panel:      RADIOGRAPHIC STUDIES: I have personally reviewed the radiological images as listed and agreed with the findings in the report.  08/28/17 MRI     No results found.  ASSESSMENT & PLAN:   62 y.o. male with  1. Recently diagnosed Small B-Cell Lymphoma /Chronic lymphocytic Leukemia.  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 Newly noted Microcytic anemia with drop in hgb  from 11.9 to 9.1 with severe iron deficiency. Not primarily related to lymphoma.  2. Severe Iron deficiency - unclear etiology -- will need GI workup  PLAN: -Discussed pt labwork today, 07/16/18; Ferritin low at 6, HGB at 10, 13% Saturation ratio, MCV at 76.6, Lymphs stable at 9.4k. PLT normal at 227k -Will set pt up for IV Injectafer again for 2 doses (this was not able to be scheduled after last visit) -Continue follow up with Dr. Owens Loffler in GI for colonoscopy/GI workup -No indication for initiating active treatment at this time.   -Will see the pt back in 3 months    Plz schedule IV Injectafer weekly x 2 doses ASAP F/u with Dr Ardis Hughs for colonoscopy RTC with Dr Irene Limbo with labs in 3 months   All of the patients questions were answered with apparent satisfaction. The patient knows to call the clinic with any problems, questions or concerns.  The total time spent in the appt was 30 minutes and more than 50% was on counseling and direct patient cares.   Sullivan Lone MD MS AAHIVMS [facility] Hematology/Oncology Physician [facility]  (Office):       [phone] (Work cell):  [phone] (Fax):           [phone]  07/16/2018 2:34 PM  I, Baldwin [place], am acting as a scribe for Dr. Sullivan Lone.   .I have reviewed the above documentation for accuracy and completeness, and I agree with the above. Brunetta Genera MD

## 2018-07-16 NOTE — Telephone Encounter (Signed)
Printed calendar and avs. °

## 2018-07-16 NOTE — Patient Instructions (Signed)
Thank you for choosing [facility] to provide your oncology and hematology care.  To afford each patient quality time with our providers, please arrive 30 minutes before your scheduled appointment time.  If you arrive late for your appointment, you may be asked to reschedule.  We strive to give you quality time with our providers, and arriving late affects you and other patients whose appointments are after yours.    If you are a no show for multiple scheduled visits, you may be dismissed from the clinic at the providers discretion.     Again, thank you for choosing [facility], our hope is that these requests will decrease the amount of time that you wait before being seen by our physicians.  ______________________________________________________________________   Should you have questions after your visit to the [facility], please contact our office at [phone] between the hours of 8:30 and 4:30 p.m.    Voicemails left after 4:30p.m will not be returned until the following business day.     For prescription refill requests, please have your pharmacy contact us directly.  Please also try to allow 48 hours for prescription requests.     Please contact the scheduling department for questions regarding scheduling.  For scheduling of procedures such as PET scans, CT scans, MRI, Ultrasound, etc please contact central scheduling at [phone].     Resources For Cancer Patients and Caregivers:    Oncolink.org:  A wonderful resource for patients and healthcare providers for information regarding your disease, ways to tract your treatment, what to expect, etc.      American Cancer Society:  800-227-2345  Can help patients locate various types of support and financial assistance   Cancer Care: 1-800-813-HOPE (4673) Provides financial assistance, online support groups, medication/co-pay assistance.     [place] DSS:  336-641-3447 Where to apply  for food stamps, Medicaid, and utility assistance   Medicare Rights Center: 800-333-4114 Helps people with Medicare understand their rights and benefits, navigate the Medicare system, and secure the quality healthcare they deserve   SCAT: 336-333-6589 [employer]'s shared-ride transportation service for eligible riders who have a disability that prevents them from riding the fixed route bus.     For additional information on assistance programs please contact our social worker:   Abigail Elmore:  [phone]  

## 2018-07-17 ENCOUNTER — Ambulatory Visit (HOSPITAL_COMMUNITY)
Admission: RE | Admit: 2018-07-17 | Discharge: 2018-07-17 | Disposition: A | Discharge status: Home / Self Care | Payer: Medicare HMO | Source: Ambulatory Visit | Attending: Hematology | Admitting: Hematology

## 2018-07-17 DIAGNOSIS — D509 Iron deficiency anemia, unspecified: Secondary | ICD-10-CM | POA: Diagnosis not present

## 2018-07-17 MED ORDER — SODIUM CHLORIDE 0.9 % IV SOLN
750.0000 mg | Freq: Once | INTRAVENOUS | Status: AC
  Administered 2018-07-17: 750 mg via INTRAVENOUS
  Filled 2018-07-17: qty 15

## 2018-07-17 MED ORDER — SODIUM CHLORIDE 0.9 % IV SOLN
INTRAVENOUS | Status: DC | PRN
  Administered 2018-07-17: 250 mL via INTRAVENOUS

## 2018-07-17 NOTE — Discharge Instructions (Signed)
Ferric carboxymaltose injection What is this medicine? FERRIC CARBOXYMALTOSE (ferr-ik car-box-ee-mol-toes) is an iron complex. Iron is used to make healthy red blood cells, which carry oxygen and nutrients throughout the body. This medicine is used to treat anemia in people with chronic kidney disease or people who cannot take iron by mouth. This medicine may be used for other purposes; ask your health care provider or pharmacist if you have questions. COMMON BRAND NAME(S): Injectafer What should I tell my health care provider before I take this medicine? They need to know if you have any of these conditions: -anemia not caused by low iron levels -high levels of iron in the blood -liver disease -an unusual or allergic reaction to iron, other medicines, foods, dyes, or preservatives -pregnant or trying to get pregnant -breast-feeding How should I use this medicine? This medicine is for infusion into a vein. It is given by a health care professional in a hospital or clinic setting. Talk to your pediatrician regarding the use of this medicine in children. Special care may be needed. Overdosage: If you think you have taken too much of this medicine contact a poison control center or emergency room at once. NOTE: This medicine is only for you. Do not share this medicine with others. What if I miss a dose? It is important not to miss your dose. Call your doctor or health care professional if you are unable to keep an appointment. What may interact with this medicine? Do not take this medicine with any of the following medications: -deferoxamine -dimercaprol -other iron products This medicine may also interact with the following medications: -chloramphenicol -deferasirox This list may not describe all possible interactions. Give your health care provider a list of all the medicines, herbs, non-prescription drugs, or dietary supplements you use. Also tell them if you smoke, drink alcohol, or use  illegal drugs. Some items may interact with your medicine. What should I watch for while using this medicine? Visit your doctor or health care professional regularly. Tell your doctor if your symptoms do not start to get better or if they get worse. You may need blood work done while you are taking this medicine. You may need to follow a special diet. Talk to your doctor. Foods that contain iron include: whole grains/cereals, dried fruits, beans, or peas, leafy green vegetables, and organ meats (liver, kidney). What side effects may I notice from receiving this medicine? Side effects that you should report to your doctor or health care professional as soon as possible: -allergic reactions like skin rash, itching or hives, swelling of the face, lips, or tongue -breathing problems -changes in blood pressure -feeling faint or lightheaded, falls -flushing, sweating, or hot feelings Side effects that usually do not require medical attention (report to your doctor or health care professional if they continue or are bothersome): -changes in taste -constipation -dizziness -headache -nausea -pain, redness, or irritation at site where injected -vomiting This list may not describe all possible side effects. Call your doctor for medical advice about side effects. You may report side effects to FDA at 1-800-FDA-1088. Where should I keep my medicine? This drug is given in a hospital or clinic and will not be stored at home. NOTE: This sheet is a summary. It may not cover all possible information. If you have questions about this medicine, talk to your doctor, pharmacist, or health care provider.  2018 Elsevier/Gold Standard (2015-09-10 11:20:47)  

## 2018-07-17 NOTE — Progress Notes (Signed)
PATIENT CARE CENTER NOTE  Diagnosis: Iron Deficiency Anemia    Provider: Dr. Irene Limbo   Procedure: IV Injectafer    Note: Patient received Injectafer infusion. Monitored patient for 30 minutes post-infusion. No adverse reaction noted. Vital signs stable. Discharge instructions given to patient. Patient alert, oriented and ambulatory at discharge.

## 2018-07-24 ENCOUNTER — Ambulatory Visit (HOSPITAL_COMMUNITY)
Admission: RE | Admit: 2018-07-24 | Discharge: 2018-07-24 | Disposition: A | Discharge status: Home / Self Care | Payer: Medicare HMO | Source: Ambulatory Visit | Attending: Hematology | Admitting: Hematology

## 2018-07-24 DIAGNOSIS — D509 Iron deficiency anemia, unspecified: Secondary | ICD-10-CM | POA: Insufficient documentation

## 2018-07-24 MED ORDER — SODIUM CHLORIDE 0.9 % IV SOLN
INTRAVENOUS | Status: DC | PRN
  Administered 2018-07-24: 250 mL via INTRAVENOUS

## 2018-07-24 MED ORDER — SODIUM CHLORIDE 0.9 % IV SOLN
750.0000 mg | Freq: Once | INTRAVENOUS | Status: AC
  Administered 2018-07-24: 750 mg via INTRAVENOUS
  Filled 2018-07-24: qty 15

## 2018-07-24 NOTE — Progress Notes (Signed)
PATIENT CARE CENTER NOTE  Diagnosis: Iron Deficiency Anemia    Provider: Dr. Irene Limbo   Procedure: IV Injectafer    Note: Patient received Injectafer infusion. Monitored patient for 30 minutes post-infusion. No adverse reaction noted. Vital signs stable. Discharge instructions given to patient. Patient alert, oriented and ambulatory at discharge.

## 2018-07-24 NOTE — Discharge Instructions (Signed)
Ferric carboxymaltose injection What is this medicine? FERRIC CARBOXYMALTOSE (ferr-ik car-box-ee-mol-toes) is an iron complex. Iron is used to make healthy red blood cells, which carry oxygen and nutrients throughout the body. This medicine is used to treat anemia in people with chronic kidney disease or people who cannot take iron by mouth. This medicine may be used for other purposes; ask your health care provider or pharmacist if you have questions. COMMON BRAND NAME(S): Injectafer What should I tell my health care provider before I take this medicine? They need to know if you have any of these conditions: -anemia not caused by low iron levels -high levels of iron in the blood -liver disease -an unusual or allergic reaction to iron, other medicines, foods, dyes, or preservatives -pregnant or trying to get pregnant -breast-feeding How should I use this medicine? This medicine is for infusion into a vein. It is given by a health care professional in a hospital or clinic setting. Talk to your pediatrician regarding the use of this medicine in children. Special care may be needed. Overdosage: If you think you have taken too much of this medicine contact a poison control center or emergency room at once. NOTE: This medicine is only for you. Do not share this medicine with others. What if I miss a dose? It is important not to miss your dose. Call your doctor or health care professional if you are unable to keep an appointment. What may interact with this medicine? Do not take this medicine with any of the following medications: -deferoxamine -dimercaprol -other iron products This medicine may also interact with the following medications: -chloramphenicol -deferasirox This list may not describe all possible interactions. Give your health care provider a list of all the medicines, herbs, non-prescription drugs, or dietary supplements you use. Also tell them if you smoke, drink alcohol, or use  illegal drugs. Some items may interact with your medicine. What should I watch for while using this medicine? Visit your doctor or health care professional regularly. Tell your doctor if your symptoms do not start to get better or if they get worse. You may need blood work done while you are taking this medicine. You may need to follow a special diet. Talk to your doctor. Foods that contain iron include: whole grains/cereals, dried fruits, beans, or peas, leafy green vegetables, and organ meats (liver, kidney). What side effects may I notice from receiving this medicine? Side effects that you should report to your doctor or health care professional as soon as possible: -allergic reactions like skin rash, itching or hives, swelling of the face, lips, or tongue -breathing problems -changes in blood pressure -feeling faint or lightheaded, falls -flushing, sweating, or hot feelings Side effects that usually do not require medical attention (report to your doctor or health care professional if they continue or are bothersome): -changes in taste -constipation -dizziness -headache -nausea -pain, redness, or irritation at site where injected -vomiting This list may not describe all possible side effects. Call your doctor for medical advice about side effects. You may report side effects to FDA at 1-800-FDA-1088. Where should I keep my medicine? This drug is given in a hospital or clinic and will not be stored at home. NOTE: This sheet is a summary. It may not cover all possible information. If you have questions about this medicine, talk to your doctor, pharmacist, or health care provider.  2018 Elsevier/Gold Standard (2015-09-10 11:20:47)  

## 2018-10-15 NOTE — Progress Notes (Signed)
HEMATOLOGY/ONCOLOGY CLINIC NOTE  Date of Service:  10/16/18   Patient Care Team: Bernerd Limbo, MD as PCP - General (Family Medicine)  CHIEF COMPLAINTS/PURPOSE OF CONSULTATION:  Small B-Cell Lymphoma  HISTORY OF PRESENTING ILLNESS:   [name] [name] is a wonderful 63 y.o. 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 The pt reports that he is doing well overall.   The pt reports that in August 2018 he felt a knot on the left side of his neck, which he spoke about with his PCP Dr. Coletta Memos. He then had an MRI in January 2019, a CXR in February 2019 and then a biopsy on 10/26/17 (results noted below). He denies any constitutional symptoms as noted below. He notes that the swelling on his neck has not increased in size and is not painful.   He notes that he gets congested at night and is only able to breathe out of one nostril; he denies having a deviated septum. He notes that he has tried to have this worked up for the last 4 years without success and has tried saline sprays without success. He notes that he wakes up with dry mouth.   The pt takes Amlodipine, 81mg  Aspirin, Atorvastatin, Cyclobenzaprine, Ferrousul, Losartan Potassium, Meloxicam, Metformin 500mg , Percocet, Protonix, Potassium Chloride, and Ambein.   Of note prior to the patient's visit today, pt has had Lymph node, needle/core biopsy, Left Supraclavicular completed on 10/31/17 with results revealing SMALL LYMPHOCYTIC LYMPHOMA.  10/26/17 Tissue Flow Cytometry revealed a Monoclonal B Cell population identified.    08/28/17 MRI revealed Extensive cervical adenopathy. Adenopathy also noted in the subpectoral nodes.   CXR on 10/11/17 was revealed to be Normal.   His 10/11/17 CBC revealed all values WNL except for % Lymph at 66.3%, Mono % at 3.3% and % Gran at 30.4%, Lymphs Abs at 6.4k, Hgb at 12.5, HCT at 37.5, MCV at 75.5, MCH at 25.1, RDW at 16.3, MPV at 6.4  On review of  systems, pt reports left cervical lymph node swelling and denies fevers, chills, night sweats, unexpected weight loss, CP, SOB, abdominal pain or swelling, pain along the spine, changes in bowel habits, skin rashes, leg swelling, and any other symptoms.    On PMHx the pt reports Hyperlipidemia, Hypertensive reitonpathy, Hypopotassemia, DM Type 2, acid reflux, spinal stenosis.  On Social Hx the pt reports being a former smoker, having quit in 2012. He notes that he smoked about 5 cigars per week before quitting. He denies consuming much ETOH. He denies any recreational drug use. He denies any unsafe needle exposure.  On Family Hx the pt reports an uncle who had cancer but wasn't sure what kind.  Interval History:  [name] [name] returns today regarding his Small B Cell Lymphoma. The patient's last visit with [place] was on 07/16/18. The pt reports that he is doing well overall.   The pt had IV Injectafer on 07/17/18 and 07/24/18. The pt notes that he has continued to have good energy levels. The pt has not been able to see GI in the interim, as he needs to have a ride provided to complete the colonoscopy, but is working on this.  The pt reports that his throat has been sore for the past few nights and mornings. He does not smoke. He notes that he has been spending the last few nights at a friends house, and that the heat is kept on 80  degrees in this home.   The pt also notes that his cervical lymph nodes seem smaller to him, and denies noticing any new lumps or bumps. He denies any pain associated to his lymph nodes. He denies any fevers, chills, night sweats, or concerns of infections.  Lab results today (10/16/18) of CBC w/diff and CMP is as follows: all values are WNL except for WBC at 11.8k, RBC at 3.97, HGB at 11.1, HCT at 34.1, RDW at 16.1, Lymphs at 8.1k, Monocytes at 1.1k, Glucose at 143, Calcium at 8.8, AST at 10 10/16/18 Ferritin is 110 10/16/18 Iron saturation 23%  On review of systems, pt  reports good energy levels, staying active, smaller lymph nodes, eating well, and denies noticing new lumps or bumps, fevers, chills, night sweats, unexpected weight loss, abdominal pains, concerns of infections, and any other symptoms.    MEDICAL HISTORY:  Past Medical History:  Diagnosis Date  . Allergic rhinitis, cause unspecified   . Backache, unspecified   . Body mass index 33.0-33.9, adult   . Diabetes mellitus without complication ([place])    Type II  . Elevated blood pressure reading without diagnosis of hypertension   . Esophageal reflux   . Generalized pain   . Heartburn   . Insomnia, unspecified   . 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 Other abnormal blood chemistry   . Other and unspecified hyperlipidemia   . Other dyspnea and respiratory abnormality   . Other nonspecific findings on examination of blood(790.99)   . Pain in joint, lower leg   . Pneumonia    1968  . Sleep apnea    Does not use or own a CPAP  . Tobacco use disorder   . Unspecified essential hypertension     SURGICAL HISTORY: Past Surgical History:  Procedure Laterality Date  . bil wrist surgery    . ENDOSCOPIC CONCHA BULLOSA RESECTION Bilateral 05/18/2018   Procedure: ENDOSCOPIC CONCHA BULLOSA RESECTION;  Surgeon: Jerrell Belfast, MD;  Location: [place];  Service: ENT;  Laterality: Bilateral;  . NASAL SEPTOPLASTY W/ TURBINOPLASTY Bilateral 05/18/2018   Procedure: NASAL SEPTOPLASTY WITH TURBINATE REDUCTION;  Surgeon: Jerrell Belfast, MD;  Location: [place];  Service: ENT;  Laterality: Bilateral;  . ORIF FOREARM FRACTURE     right  . plastic surgery to face    . SINUS ENDO W/FUSION Bilateral 05/18/2018   Procedure: ENDOSCOPIC SINUS SURGERY WITH NAVIGATION;  Surgeon: Jerrell Belfast, MD;  Location: [place];  Service: ENT;  Laterality: Bilateral;    SOCIAL HISTORY: Social History   Socioeconomic History  . Marital status: Legally Separated    Spouse name:  Not on file  . Number of children: 1  . Years of education: Not on file  . Highest education level: Not on file  Occupational History  . Not on file  Social Needs  . Financial resource strain: Not on file  . Food insecurity:    Worry: Not on file    Inability: Not on file  . Transportation needs:    Medical: Not on file    Non-medical: Not on file  Tobacco Use  . Smoking status: Former Smoker    Packs/day: 0.20    Years: 20.00    Pack years: 4.00    Types: Cigars    Last attempt to quit: 2014    Years since quitting: 6.1  . Smokeless tobacco: Never Used  Substance and Sexual Activity  . Alcohol use: Yes    Comment:  2 40oz beers a week  . Drug use: No  . Sexual activity: Not on file  Lifestyle  . Physical activity:    Days per week: Not on file    Minutes per session: Not on file  . Stress: Not on file  Relationships  . Social connections:    Talks on phone: Not on file    Gets together: Not on file    Attends religious service: Not on file    Active member of club or organization: Not on file    Attends meetings of clubs or organizations: Not on file    Relationship status: Not on file  . Intimate partner violence:    Fear of current or ex partner: Not on file    Emotionally abused: Not on file    Physically abused: Not on file    Forced sexual activity: Not on file  Other Topics Concern  . Not on file  Social History Narrative  . Not on file    FAMILY HISTORY: Family History  Problem Relation Age of Onset  . Diabetes Mother   . Hypertension Mother   . Cancer Paternal Uncle     ALLERGIES:  is allergic to lisinopril and tuberculin tests.  MEDICATIONS:  Current Outpatient Medications  Medication Sig Dispense Refill  . amLODipine (NORVASC) 10 MG tablet Take 10 mg by mouth daily.    [name] atorvastatin (LIPITOR) 20 MG tablet Take 20 mg by mouth daily.    . cyclobenzaprine (FLEXERIL) 10 MG tablet Take 10 mg by mouth at bedtime.    [name] losartan (COZAAR) 50 MG  tablet Take 50 mg by mouth daily.    . meloxicam (MOBIC) 15 MG tablet Take 15 mg by mouth daily.    . metFORMIN (GLUCOPHAGE) 500 MG tablet Take 500 mg by mouth daily.    [name] oxyCODONE-acetaminophen (PERCOCET/ROXICET) 5-325 MG tablet Take 1 tablet by mouth 3 (three) times daily as needed for pain.  0  . pantoprazole (PROTONIX) 40 MG tablet Take 40 mg by mouth at bedtime.     . polyethylene glycol-electrolytes (NULYTELY/GOLYTELY) 420 g solution Take 4,000 mLs by mouth as directed. 4000 mL 0  . potassium chloride SA (K-DUR,KLOR-CON) 20 MEQ tablet Take 20 mEq by mouth daily.    [name] zolpidem (AMBIEN) 10 MG tablet Take 10 mg by mouth at bedtime.      No current facility-administered medications for this visit.     REVIEW OF SYSTEMS:    A 10+ POINT REVIEW OF SYSTEMS WAS OBTAINED including neurology, dermatology, psychiatry, cardiac, respiratory, lymph, extremities, GI, GU, Musculoskeletal, constitutional, breasts, reproductive, HEENT.  All pertinent positives are noted in the HPI.  All others are negative.   PHYSICAL EXAMINATION: ECOG PERFORMANCE STATUS: 1 - Symptomatic but completely ambulatory  Vitals:   10/16/18 1406  BP: 135/77  Pulse: 74  Resp: 17  Temp: 97.8 F (36.6 C)  SpO2: 99%   Filed Weights   10/16/18 1406  Weight: 182 lb 6.4 oz (82.7 kg)   .Body mass index is 28.57 kg/m.  GENERAL:alert, in no acute distress and comfortable SKIN: no acute rashes, no significant lesions EYES: conjunctiva are pink and non-injected, sclera anicteric OROPHARYNX: MMM, no exudates, no oropharyngeal erythema or ulceration NECK: supple, no JVD LYMPH: Small palpable cervical LNadeonpathy, bilateral axillary lymphadenopathy, no palpable lymphadenopathy in the inguinal regions LUNGS: clear to auscultation b/l with normal respiratory effort HEART: regular rate & rhythm ABDOMEN:  normoactive bowel sounds , non tender, not distended. Spleen just  palpable about 1cm fingerbreadth under left costal  margin Extremity: no pedal edema PSYCH: alert & oriented x 3 with fluent speech NEURO: no focal motor/sensory deficits   LABORATORY DATA:  I have reviewed the data as listed  . CBC Latest Ref Rng & Units 10/16/2018 07/16/2018 05/16/2018  WBC 4.0 - 10.5 K/uL 11.8(H) 13.2(H) 11.2(H)  Hemoglobin 13.0 - 17.0 g/dL 11.1(L) 10.0(L) 9.6(L)  Hematocrit 39.0 - 52.0 % 34.1(L) 31.4(L) 31.2(L)  Platelets 150 - 400 K/uL 208 227 191    CBC    Component Value Date/Time   WBC 11.8 (H) 10/16/2018 1352   RBC 3.97 (L) 10/16/2018 1352   HGB 11.1 (L) 10/16/2018 1352   HGB 11.9 (L) 11/10/2017 1008   HCT 34.1 (L) 10/16/2018 1352   PLT 208 10/16/2018 1352   PLT 228 11/10/2017 1008   MCV 85.9 10/16/2018 1352   MCH 28.0 10/16/2018 1352   MCHC 32.6 10/16/2018 1352   RDW 16.1 (H) 10/16/2018 1352   LYMPHSABS 8.1 (H) 10/16/2018 1352   MONOABS 1.1 (H) 10/16/2018 1352   EOSABS 0.0 10/16/2018 1352   BASOSABS 0.0 10/16/2018 1352    CMP Latest Ref Rng & Units 10/16/2018 07/16/2018 05/16/2018  Glucose 70 - 99 mg/dL 143(H) 145(H) 109(H)  BUN 8 - 23 mg/dL 18 11 7(L)  Creatinine 0.61 - 1.24 mg/dL 1.13 1.23 1.07  Sodium 135 - 145 mmol/L 140 140 139  Potassium 3.5 - 5.1 mmol/L 4.5 4.4 3.9  Chloride 98 - 111 mmol/L 107 110 108  CO2 22 - 32 mmol/L 23 23 23   Calcium 8.9 - 10.3 mg/dL 8.8(L) 9.2 9.2  Total Protein 6.5 - 8.1 g/dL 7.1 7.2 -  Total Bilirubin 0.3 - 1.2 mg/dL 0.8 0.8 -  Alkaline Phos 38 - 126 U/L 98 106 -  AST 15 - 41 U/L 10(L) 10(L) -  ALT 0 - 44 U/L 8 <6 -   . Lab Results  Component Value Date   IRON 58 07/16/2018   TIBC 440 (H) 07/16/2018   IRONPCTSAT 13 (L) 07/16/2018   (Iron and TIBC)  Lab Results  Component Value Date   FERRITIN 6 (L) 07/16/2018   . Lab Results  Component Value Date   IRON 67 10/16/2018   TIBC 290 10/16/2018   IRONPCTSAT 23 10/16/2018   (Iron and TIBC)  Lab Results  Component Value Date   FERRITIN 110 10/16/2018    Component     Latest Ref Rng & Units  11/10/2017  Retic Ct Pct     0.8 - 1.8 % 1.3  RBC.     4.20 - 5.82 MIL/uL 4.75  Retic Count, Absolute     34.8 - 93.9 K/uL 61.8  LDH     125 - 245 U/L 152  HCV Ab     0.0 - 0.9 s/co ratio <0.1  HIV Screen 4th Generation wRfx     Non Reactive Non Reactive  Hepatitis B Surface Ag     Negative Negative  Hep B Core Ab, Tot     Negative Negative    10/31/17 Tissue Flow Cytometry:   10/31/17 Lymph Node Needle/Core Biopsy:  11/10/17 Peripheral Blood Flow Cytometry:   11/10/17 FISH CLL Prognostic Panel:      RADIOGRAPHIC STUDIES: I have personally reviewed the radiological images as listed and agreed with the findings in the report.  08/28/17 MRI     No results found.  ASSESSMENT & PLAN:   63 y.o. male with  1. Recently diagnosed Small  B-Cell Lymphoma /Chronic lymphocytic Leukemia.  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 Newly noted Microcytic anemia with drop in hgb from 11.9 to 9.1 with severe iron deficiency. Not primarily related to lymphoma.  2. Severe Iron deficiency - unclear etiology -- will need GI workup  PLAN: -Discussed pt labwork today, 10/16/18; WBC improved to 11.8k, HGB improved to 11.1 with MCV normalized to 85.9 -Goal for ferritin >50 -Continue follow up with Dr. Owens Loffler in GI to obtain colonoscopy and endoscopy for further evaluation of IDA -Recommend using a humidifier at night  -The pt shows no overt clinical or lab progression of his CLL at this time.  -No indication to initiate active treatment at this time. -Discussed the criteria to consider initiating treatment including threat of organ injury, cytopenias, and constitutional symptoms which the pt will be mindful for -Discussed the possible treatments that could be pursued in the future -Will repeat CT C/A/P prior to next visit -Will see the pt back in 2 months   CT chest/abd/pelvis in 8 weeks RTC with dr Irene Limbo with labs and CT in 2 months   All of the patients  questions were answered with apparent satisfaction. The patient knows to call the clinic with any problems, questions or concerns.  The total time spent in the appt was 30 minutes and more than 50% was on counseling and direct patient cares.   Sullivan Lone MD MS AAHIVMS [facility] Hematology/Oncology Physician [facility]  (Office):       [phone] (Work cell):  [phone] (Fax):           [phone]  10/16/2018 2:38 PM  I, Baldwin Jamaica, am acting as a scribe for Dr. Sullivan Lone.   .I have reviewed the above documentation for accuracy and completeness, and I agree with the above. Brunetta Genera MD

## 2018-10-16 ENCOUNTER — Telehealth: Payer: Self-pay | Admitting: Hematology

## 2018-10-16 ENCOUNTER — Inpatient Hospital Stay (HOSPITAL_BASED_OUTPATIENT_CLINIC_OR_DEPARTMENT_OTHER): Payer: Medicare HMO | Admitting: Hematology

## 2018-10-16 ENCOUNTER — Inpatient Hospital Stay: Payer: Medicare HMO | Attending: Hematology

## 2018-10-16 VITALS — BP 135/77 | HR 74 | Temp 97.8°F | Resp 17 | Ht 67.0 in | Wt 182.4 lb

## 2018-10-16 DIAGNOSIS — D509 Iron deficiency anemia, unspecified: Secondary | ICD-10-CM | POA: Insufficient documentation

## 2018-10-16 DIAGNOSIS — C8308 Small cell B-cell lymphoma, lymph nodes of multiple sites: Secondary | ICD-10-CM | POA: Insufficient documentation

## 2018-10-16 DIAGNOSIS — D500 Iron deficiency anemia secondary to blood loss (chronic): Secondary | ICD-10-CM

## 2018-10-16 LAB — CMP (CANCER CENTER ONLY)
ALBUMIN: 4.1 g/dL (ref 3.5–5.0)
ALT: 8 U/L (ref 0–44)
AST: 10 U/L — AB (ref 15–41)
Alkaline Phosphatase: 98 U/L (ref 38–126)
Anion gap: 10 (ref 5–15)
BUN: 18 mg/dL (ref 8–23)
CHLORIDE: 107 mmol/L (ref 98–111)
CO2: 23 mmol/L (ref 22–32)
Calcium: 8.8 mg/dL — ABNORMAL LOW (ref 8.9–10.3)
Creatinine: 1.13 mg/dL (ref 0.61–1.24)
GFR, Est AFR Am: >60 mL/min (ref >60)
Glucose, Bld: 143 mg/dL — ABNORMAL HIGH (ref 70–99)
POTASSIUM: 4.5 mmol/L (ref 3.5–5.1)
Sodium: 140 mmol/L (ref 135–145)
Total Bilirubin: 0.8 mg/dL (ref 0.3–1.2)
Total Protein: 7.1 g/dL (ref 6.5–8.1)

## 2018-10-16 LAB — CBC WITH DIFFERENTIAL/PLATELET
ABS IMMATURE GRANULOCYTES: 0.02 10*3/uL (ref 0.00–0.07)
BASOS ABS: 0 10*3/uL (ref 0.0–0.1)
Basophils Relative: 0 %
Eosinophils Absolute: 0 10*3/uL (ref 0.0–0.5)
Eosinophils Relative: 0 %
HCT: 34.1 % — ABNORMAL LOW (ref 39.0–52.0)
Hemoglobin: 11.1 g/dL — ABNORMAL LOW (ref 13.0–17.0)
IMMATURE GRANULOCYTES: 0 %
LYMPHS PCT: 69 %
Lymphs Abs: 8.1 10*3/uL — ABNORMAL HIGH (ref 0.7–4.0)
MCH: 28 pg (ref 26.0–34.0)
MCHC: 32.6 g/dL (ref 30.0–36.0)
MCV: 85.9 fL (ref 80.0–100.0)
MONO ABS: 1.1 10*3/uL — AB (ref 0.1–1.0)
Monocytes Relative: 9 %
NEUTROS ABS: 2.6 10*3/uL (ref 1.7–7.7)
Neutrophils Relative %: 22 %
Platelets: 208 10*3/uL (ref 150–400)
RBC: 3.97 MIL/uL — AB (ref 4.22–5.81)
RDW: 16.1 % — ABNORMAL HIGH (ref 11.5–15.5)
WBC: 11.8 10*3/uL — AB (ref 4.0–10.5)
nRBC: 0 % (ref 0.0–0.2)

## 2018-10-16 LAB — IRON AND TIBC
Iron: 67 ug/dL (ref 42–163)
SATURATION RATIOS: 23 % (ref 20–55)
TIBC: 290 ug/dL (ref 202–409)
UIBC: 223 ug/dL (ref 117–376)

## 2018-10-16 NOTE — Telephone Encounter (Signed)
Gave avs and calendar ° °

## 2018-10-17 LAB — FERRITIN: FERRITIN: 110 ng/mL (ref 24–336)

## 2018-12-05 ENCOUNTER — Telehealth: Payer: Self-pay | Admitting: *Deleted

## 2018-12-05 NOTE — Telephone Encounter (Signed)
Patient called office. Patient stated he had CT scheduled on 4/21, so appts for lab/MD will be moved to 4/23 or 4/24 to allow MD to have results. Patient states he needs to come by here and pick up CT contrast on Monday. Informed him it will be at the front reception desk with his name on it for pick up.

## 2018-12-05 NOTE — Telephone Encounter (Signed)
Attempted to contact patient: left message asking to reschedule appt times on 4/20 (currently 2:00pm and 2:40pm) - to lab at 10 am and Dr. Irene Limbo at 10:40am. Left message for patient to call office  [phone] to confirm change.

## 2018-12-06 ENCOUNTER — Telehealth: Payer: Self-pay | Admitting: Hematology

## 2018-12-06 NOTE — Telephone Encounter (Signed)
Spoke with patient to confirm lab/fu scheduled for 4/28. 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 Gave patient lab/fu 4/24

## 2018-12-10 ENCOUNTER — Other Ambulatory Visit: Payer: Medicare HMO

## 2018-12-10 ENCOUNTER — Ambulatory Visit: Payer: Medicare HMO | Admitting: Hematology

## 2018-12-11 ENCOUNTER — Ambulatory Visit (HOSPITAL_COMMUNITY): Payer: Medicare HMO

## 2018-12-11 ENCOUNTER — Ambulatory Visit (HOSPITAL_COMMUNITY): Admission: RE | Admit: 2018-12-11 | Payer: Medicare HMO | Source: Ambulatory Visit

## 2018-12-11 ENCOUNTER — Other Ambulatory Visit: Payer: Self-pay

## 2018-12-11 ENCOUNTER — Ambulatory Visit (HOSPITAL_COMMUNITY)
Admission: RE | Admit: 2018-12-11 | Discharge: 2018-12-11 | Disposition: A | Discharge status: Home / Self Care | Payer: Medicare HMO | Source: Ambulatory Visit | Attending: Hematology | Admitting: Hematology

## 2018-12-11 DIAGNOSIS — C8308 Small cell B-cell lymphoma, lymph nodes of multiple sites: Secondary | ICD-10-CM

## 2018-12-11 LAB — POCT I-STAT CREATININE: Creatinine, Ser: 1.2 mg/dL (ref 0.61–1.24)

## 2018-12-11 MED ORDER — IOHEXOL 300 MG/ML  SOLN
100.0000 mL | Freq: Once | INTRAMUSCULAR | Status: AC | PRN
  Administered 2018-12-11: 100 mL via INTRAVENOUS

## 2018-12-13 NOTE — Progress Notes (Signed)
HEMATOLOGY/ONCOLOGY CLINIC NOTE  Date of Service:  12/14/18   Patient Care Team: Bernerd Limbo, MD as PCP - General (Family Medicine)  CHIEF COMPLAINTS/PURPOSE OF CONSULTATION:  Small B-Cell Lymphoma  HISTORY OF PRESENTING ILLNESS:   [name] [name] is a wonderful 63 y.o. 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 The pt reports that he is doing well overall.   The pt reports that in August 2018 he felt a knot on the left side of his neck, which he spoke about with his PCP Dr. Coletta Memos. He then had an MRI in January 2019, a CXR in February 2019 and then a biopsy on 10/26/17 (results noted below). He denies any constitutional symptoms as noted below. He notes that the swelling on his neck has not increased in size and is not painful.   He notes that he gets congested at night and is only able to breathe out of one nostril; he denies having a deviated septum. He notes that he has tried to have this worked up for the last 4 years without success and has tried saline sprays without success. He notes that he wakes up with dry mouth.   The pt takes Amlodipine, 81mg  Aspirin, Atorvastatin, Cyclobenzaprine, Ferrousul, Losartan Potassium, Meloxicam, Metformin 500mg , Percocet, Protonix, Potassium Chloride, and Ambein.   Of note prior to the patient's visit today, pt has had Lymph node, needle/core biopsy, Left Supraclavicular completed on 10/31/17 with results revealing SMALL LYMPHOCYTIC LYMPHOMA.  10/26/17 Tissue Flow Cytometry revealed a Monoclonal B Cell population identified.    08/28/17 MRI revealed Extensive cervical adenopathy. Adenopathy also noted in the subpectoral nodes.   CXR on 10/11/17 was revealed to be Normal.   His 10/11/17 CBC revealed all values WNL except for % Lymph at 66.3%, Mono % at 3.3% and % Gran at 30.4%, Lymphs Abs at 6.4k, Hgb at 12.5, HCT at 37.5, MCV at 75.5, MCH at 25.1, RDW at 16.3, MPV at 6.4  On review of  systems, pt reports left cervical lymph node swelling and denies fevers, chills, night sweats, unexpected weight loss, CP, SOB, abdominal pain or swelling, pain along the spine, changes in bowel habits, skin rashes, leg swelling, and any other symptoms.    On PMHx the pt reports Hyperlipidemia, Hypertensive reitonpathy, Hypopotassemia, DM Type 2, acid reflux, spinal stenosis.  On Social Hx the pt reports being a former smoker, having quit in 2012. He notes that he smoked about 5 cigars per week before quitting. He denies consuming much ETOH. He denies any recreational drug use. He denies any unsafe needle exposure.  On Family Hx the pt reports an uncle who had cancer but wasn't sure what kind.  Interval History:  [name] [name] returns today regarding his Small B Cell Lymphoma. The patient's last visit with [place] was on 10/16/18. The pt reports that he is doing well overall.  The pt reports that he has not developed any new concerns in the interim. The pt notes that he has not had any changes in bowel habits, nor blood in the stools or black stools. The pt denies noticing any new lumps or bumps, abdominal pains, CP, or SOB. The pt notes that his right neck lymph node may have improved in the interim. The pt notes that he has not had a endoscopy yet.  The pt endorses good energy levels and is staying active.  Of note since the patient's last visit, pt  has had a CT C/A/P completed on 12/11/18 with results revealing Interval progressive disease. Significant increase in adenopathy within the abdomen/pelvis. More mild enlargement of lymph nodes within the chest. 2. No splenomegaly. 3.  Aortic Atherosclerosis.  Lab results today (12/14/18) of CBC w/diff and CMP is as follows: all values are WNL except for WBC at 18.4k, RBC at 3.63, HGB at 10.2, HCT at 31.5, Lymphs abs at 14.6k, Glucose at 120, Creatinine at 1.31, Calcium at 8.7, AST at 13. 12/14/18 LDH normal at 136  On review of systems, pt reports good  energy levels, moving his bowels well, improved right neck lymph node, staying active, and denies fevers, chills, night sweats, unexpected weight loss, new lumps or bumps, changes in bowel habits, blood in the stools, black stools, CP, SOB, abdominal pains, and any other symptoms.  MEDICAL HISTORY:  Past Medical History:  Diagnosis Date   Allergic rhinitis, cause unspecified    Backache, unspecified    Body mass index 33.0-33.9, adult    Diabetes mellitus without complication (HCC)    Type II   Elevated blood pressure reading without diagnosis of hypertension    Esophageal reflux    Generalized pain    Heartburn    Insomnia, unspecified    Nonspecific reaction to tuberculin skin test without active tuberculosis(795.51)    Other abnormal blood chemistry    Other and unspecified hyperlipidemia    Other dyspnea and respiratory abnormality    Other nonspecific findings on examination of blood(790.99)    Pain in joint, lower leg    Pneumonia    1968   Sleep apnea    Does not use or own a CPAP   Tobacco use disorder    Unspecified essential hypertension     SURGICAL HISTORY: Past Surgical History:  Procedure Laterality Date   bil wrist surgery     ENDOSCOPIC CONCHA BULLOSA RESECTION Bilateral 05/18/2018   Procedure: ENDOSCOPIC CONCHA BULLOSA RESECTION;  Surgeon: Jerrell Belfast, MD;  Location: [place];  Service: ENT;  Laterality: Bilateral;   NASAL SEPTOPLASTY W/ TURBINOPLASTY Bilateral 05/18/2018   Procedure: NASAL SEPTOPLASTY WITH TURBINATE REDUCTION;  Surgeon: Jerrell Belfast, MD;  Location: [place];  Service: ENT;  Laterality: Bilateral;   ORIF FOREARM FRACTURE     right   plastic surgery to face     SINUS ENDO W/FUSION Bilateral 05/18/2018   Procedure: ENDOSCOPIC SINUS SURGERY WITH NAVIGATION;  Surgeon: Jerrell Belfast, MD;  Location[facility];  Service: ENT;  Laterality: Bilateral;    SOCIAL HISTORY: Social History   Socioeconomic History   Marital  status: Legally Separated    Spouse name: Not on file   Number of children: 1   Years of education: Not on file   Highest education level: Not on file  Occupational History   Not on file  Social Needs   Financial resource strain: Not on file   Food insecurity:    Worry: Not on file    Inability: Not on file   Transportation needs:    Medical: Not on file    Non-medical: Not on file  Tobacco Use   Smoking status: Former Smoker    Packs/day: 0.20    Years: 20.00    Pack years: 4.00    Types: Cigars    Last attempt to quit: 2014    Years since quitting: 6.3   Smokeless tobacco: Never Used  Substance and Sexual Activity   Alcohol use: Yes    Comment: 2 40oz beers a week  Drug use: No   Sexual activity: Not on file  Lifestyle   Physical activity:    Days per week: Not on file    Minutes per session: Not on file   Stress: Not on file  Relationships   Social connections:    Talks on phone: Not on file    Gets together: Not on file    Attends religious service: Not on file    Active member of club or organization: Not on file    Attends meetings of clubs or organizations: Not on file    Relationship status: Not on file   Intimate partner violence:    Fear of current or ex partner: Not on file    Emotionally abused: Not on file    Physically abused: Not on file    Forced sexual activity: Not on file  Other Topics Concern   Not on file  Social History Narrative   Not on file    FAMILY HISTORY: Family History  Problem Relation Age of Onset   Diabetes Mother    Hypertension Mother    Cancer Paternal Uncle     ALLERGIES:  is allergic to lisinopril and tuberculin tests.  MEDICATIONS:  Current Outpatient Medications  Medication Sig Dispense Refill   amLODipine (NORVASC) 10 MG tablet Take 10 mg by mouth daily.     atorvastatin (LIPITOR) 20 MG tablet Take 20 mg by mouth daily.     cyclobenzaprine (FLEXERIL) 10 MG tablet Take 10 mg by mouth  at bedtime.     losartan (COZAAR) 50 MG tablet Take 50 mg by mouth daily.     meloxicam (MOBIC) 15 MG tablet Take 15 mg by mouth daily.     metFORMIN (GLUCOPHAGE) 500 MG tablet Take 500 mg by mouth daily.     oxyCODONE-acetaminophen (PERCOCET/ROXICET) 5-325 MG tablet Take 1 tablet by mouth 3 (three) times daily as needed for pain.  0   pantoprazole (PROTONIX) 40 MG tablet Take 40 mg by mouth at bedtime.      polyethylene glycol-electrolytes (NULYTELY/GOLYTELY) 420 g solution Take 4,000 mLs by mouth as directed. 4000 mL 0   potassium chloride SA (K-DUR,KLOR-CON) 20 MEQ tablet Take 20 mEq by mouth daily.     zolpidem (AMBIEN) 10 MG tablet Take 10 mg by mouth at bedtime.      No current facility-administered medications for this visit.     REVIEW OF SYSTEMS:    A 10+ POINT REVIEW OF SYSTEMS WAS OBTAINED including neurology, dermatology, psychiatry, cardiac, respiratory, lymph, extremities, GI, GU, Musculoskeletal, constitutional, breasts, reproductive, HEENT.  All pertinent positives are noted in the HPI.  All others are negative.   PHYSICAL EXAMINATION: ECOG PERFORMANCE STATUS: 1 - Symptomatic but completely ambulatory  Vitals:   12/14/18 0908  BP: (!) 143/69  Pulse: 77  Resp: 18  Temp: (!) 97.5 F (36.4 C)  SpO2: 99%   Filed Weights   12/14/18 0908  Weight: 181 lb 6.4 oz (82.3 kg)   .Body mass index is 28.41 kg/m.  GENERAL:alert, in no acute distress and comfortable SKIN: no acute rashes, no significant lesions EYES: conjunctiva are pink and non-injected, sclera anicteric OROPHARYNX: MMM, no exudates, no oropharyngeal erythema or ulceration NECK: supple, no JVD LYMPH: Small palpable cervical LNadenopathy. Bilateral axillary lymphadenopathy. No palpable lymphadenopathy in the inguinal regions LUNGS: clear to auscultation b/l with normal respiratory effort HEART: regular rate & rhythm ABDOMEN:  normoactive bowel sounds , non tender, not distended. Spleen just palpable  about 1cm fingerbreadth  under left costal margin.  Extremity: no pedal edema PSYCH: alert & oriented x 3 with fluent speech NEURO: no focal motor/sensory deficits   LABORATORY DATA:  I have reviewed the data as listed  . CBC Latest Ref Rng & Units 12/14/2018 10/16/2018 07/16/2018  WBC 4.0 - 10.5 K/uL 18.4(H) 11.8(H) 13.2(H)  Hemoglobin 13.0 - 17.0 g/dL 10.2(L) 11.1(L) 10.0(L)  Hematocrit 39.0 - 52.0 % 31.5(L) 34.1(L) 31.4(L)  Platelets 150 - 400 K/uL 204 208 227    CBC    Component Value Date/Time   WBC 18.4 (H) 12/14/2018 0849   RBC 3.63 (L) 12/14/2018 0849   HGB 10.2 (L) 12/14/2018 0849   HGB 11.9 (L) 11/10/2017 1008   HCT 31.5 (L) 12/14/2018 0849   PLT 204 12/14/2018 0849   PLT 228 11/10/2017 1008   MCV 86.8 12/14/2018 0849   MCH 28.1 12/14/2018 0849   MCHC 32.4 12/14/2018 0849   RDW 13.3 12/14/2018 0849   LYMPHSABS 14.6 (H) 12/14/2018 0849   MONOABS 1.0 12/14/2018 0849   EOSABS 0.1 12/14/2018 0849   BASOSABS 0.0 12/14/2018 0849    CMP Latest Ref Rng & Units 12/14/2018 12/11/2018 10/16/2018  Glucose 70 - 99 mg/dL 120(H) - 143(H)  BUN 8 - 23 mg/dL 13 - 18  Creatinine 0.61 - 1.24 mg/dL 1.31(H) 1.20 1.13  Sodium 135 - 145 mmol/L 140 - 140  Potassium 3.5 - 5.1 mmol/L 4.0 - 4.5  Chloride 98 - 111 mmol/L 108 - 107  CO2 22 - 32 mmol/L 23 - 23  Calcium 8.9 - 10.3 mg/dL 8.7(L) - 8.8(L)  Total Protein 6.5 - 8.1 g/dL 7.0 - 7.1  Total Bilirubin 0.3 - 1.2 mg/dL 0.6 - 0.8  Alkaline Phos 38 - 126 U/L 80 - 98  AST 15 - 41 U/L 13(L) - 10(L)  ALT 0 - 44 U/L 11 - 8   . Lab Results  Component Value Date   IRON 67 10/16/2018   TIBC 290 10/16/2018   IRONPCTSAT 23 10/16/2018   (Iron and TIBC)  Lab Results  Component Value Date   FERRITIN 110 10/16/2018   . Lab Results  Component Value Date   IRON 67 10/16/2018   TIBC 290 10/16/2018   IRONPCTSAT 23 10/16/2018   (Iron and TIBC)  Lab Results  Component Value Date   FERRITIN 110 10/16/2018    Component     Latest Ref  Rng & Units 11/10/2017  Retic Ct Pct     0.8 - 1.8 % 1.3  RBC.     4.20 - 5.82 MIL/uL 4.75  Retic Count, Absolute     34.8 - 93.9 K/uL 61.8  LDH     125 - 245 U/L 152  HCV Ab     0.0 - 0.9 s/co ratio <0.1  HIV Screen 4th Generation wRfx     Non Reactive Non Reactive  Hepatitis B Surface Ag     Negative Negative  Hep B Core Ab, Tot     Negative Negative    10/31/17 Tissue Flow Cytometry:   10/31/17 Lymph Node Needle/Core Biopsy:  11/10/17 Peripheral Blood Flow Cytometry:   11/10/17 FISH CLL Prognostic Panel:      RADIOGRAPHIC STUDIES: I have personally reviewed the radiological images as listed and agreed with the findings in the report.  08/28/17 MRI     Ct Chest W Contrast  Result Date: 12/11/2018 CLINICAL DATA:  Non-Hodgkin's lymphoma. Recent diagnosis. Evaluate for progression. EXAM: CT CHEST, ABDOMEN, AND PELVIS WITH CONTRAST TECHNIQUE:  Multidetector CT imaging of the chest, abdomen and pelvis was performed following the standard protocol during bolus administration of intravenous contrast. CONTRAST:  167mL OMNIPAQUE IOHEXOL 300 MG/ML  SOLN COMPARISON:  PET of 11/22/2017.  No prior CTs for comparison. FINDINGS: CT CHEST FINDINGS Cardiovascular: Aortic atherosclerosis. Tortuous thoracic aorta. Normal heart size, without pericardial effusion. No central pulmonary embolism, on this non-dedicated study. Mediastinum/Nodes: Index left supraclavicular node measures 1.4 cm on image 4/3 versus 1.1 cm on the prior exam (when remeasured). Left axillary nodal mass measures 5.2 x 4.5 cm on image 17/3. Compare 4.9 x 3.3 cm on the prior. Right paratracheal node measures 1.6 cm on image 18/3 versus 1.3 cm on the prior. Bulky retrocrural adenopathy including at 2.2 cm on image 48/3. Compare 1.7 cm on the prior exam (when remeasured). Lungs/Pleura: No pleural fluid. Posterior upper lobe vague 3 mm pulmonary nodule on image 68/4, similar to the prior PET. Calcified lingular nodule of 4 mm is not  significantly changed. There are clustered calcified nodules in the posterior left upper including on image 60/4 which are likely related to old granulomatous disease and grossly similar to on the prior PET. Musculoskeletal: No acute osseous abnormality. CT ABDOMEN PELVIS FINDINGS Hepatobiliary: Normal liver. Normal gallbladder, without biliary ductal dilatation. Pancreas: Normal, without mass or ductal dilatation. Spleen: Normal in size, without focal abnormality. Adrenals/Urinary Tract: Normal adrenal glands. Lower pole right renal 1.4 cm cyst. Bilateral too small to characterize renal lesions. Normal urinary bladder. Stomach/Bowel: Tiny hiatal hernia. Normal colon and terminal ileum. Normal small bowel caliber. Vascular/Lymphatic: Aortic and branch vessel atherosclerosis. Bulky retroperitoneal adenopathy. A node just anterior to the IVC, at the level of the aortic bifurcation, measures 5.7 cm on image 79/3 versus 2.4 cm on the prior PET. Small bowel mesenteric adenopathy at up to 3.0 cm on image 66/3. Compare 1.5 cm on 11/22/2017. An index right external iliac node measures 2.9 x 4.9 cm on image 102/3. Compare 2.2 x 3.8 cm on the prior. Reproductive: Normal prostate. Other: No significant free fluid. Musculoskeletal: No acute osseous abnormality. IMPRESSION: 1. Interval progressive disease. Significant increase in adenopathy within the abdomen/pelvis. More mild enlargement of lymph nodes within the chest. 2. No splenomegaly. 3.  Aortic Atherosclerosis (ICD10-I70.0). Electronically Signed   By: Abigail Miyamoto M.D.   On: 12/11/2018 16:52   Ct Abdomen Pelvis W Contrast  Result Date: 12/11/2018 CLINICAL DATA:  Non-Hodgkin's lymphoma. Recent diagnosis. Evaluate for progression. EXAM: CT CHEST, ABDOMEN, AND PELVIS WITH CONTRAST TECHNIQUE: Multidetector CT imaging of the chest, abdomen and pelvis was performed following the standard protocol during bolus administration of intravenous contrast. CONTRAST:  110mL  OMNIPAQUE IOHEXOL 300 MG/ML  SOLN COMPARISON:  PET of 11/22/2017.  No prior CTs for comparison. FINDINGS: CT CHEST FINDINGS Cardiovascular: Aortic atherosclerosis. Tortuous thoracic aorta. Normal heart size, without pericardial effusion. No central pulmonary embolism, on this non-dedicated study. Mediastinum/Nodes: Index left supraclavicular node measures 1.4 cm on image 4/3 versus 1.1 cm on the prior exam (when remeasured). Left axillary nodal mass measures 5.2 x 4.5 cm on image 17/3. Compare 4.9 x 3.3 cm on the prior. Right paratracheal node measures 1.6 cm on image 18/3 versus 1.3 cm on the prior. Bulky retrocrural adenopathy including at 2.2 cm on image 48/3. Compare 1.7 cm on the prior exam (when remeasured). Lungs/Pleura: No pleural fluid. Posterior upper lobe vague 3 mm pulmonary nodule on image 68/4, similar to the prior PET. Calcified lingular nodule of 4 mm is not significantly changed. There are  clustered calcified nodules in the posterior left upper including on image 60/4 which are likely related to old granulomatous disease and grossly similar to on the prior PET. Musculoskeletal: No acute osseous abnormality. CT ABDOMEN PELVIS FINDINGS Hepatobiliary: Normal liver. Normal gallbladder, without biliary ductal dilatation. Pancreas: Normal, without mass or ductal dilatation. Spleen: Normal in size, without focal abnormality. Adrenals/Urinary Tract: Normal adrenal glands. Lower pole right renal 1.4 cm cyst. Bilateral too small to characterize renal lesions. Normal urinary bladder. Stomach/Bowel: Tiny hiatal hernia. Normal colon and terminal ileum. Normal small bowel caliber. Vascular/Lymphatic: Aortic and branch vessel atherosclerosis. Bulky retroperitoneal adenopathy. A node just anterior to the IVC, at the level of the aortic bifurcation, measures 5.7 cm on image 79/3 versus 2.4 cm on the prior PET. Small bowel mesenteric adenopathy at up to 3.0 cm on image 66/3. Compare 1.5 cm on 11/22/2017. An index  right external iliac node measures 2.9 x 4.9 cm on image 102/3. Compare 2.2 x 3.8 cm on the prior. Reproductive: Normal prostate. Other: No significant free fluid. Musculoskeletal: No acute osseous abnormality. IMPRESSION: 1. Interval progressive disease. Significant increase in adenopathy within the abdomen/pelvis. More mild enlargement of lymph nodes within the chest. 2. No splenomegaly. 3.  Aortic Atherosclerosis (ICD10-I70.0). Electronically Signed   By: Abigail Miyamoto M.D.   On: 12/11/2018 16:52    ASSESSMENT & PLAN:   63 y.o. male with  1. Recently diagnosed Small B-Cell Lymphoma /Chronic lymphocytic Leukemia.  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 Newly noted Microcytic anemia with drop in hgb from 11.9 to 9.1 with severe iron deficiency. Not primarily related to lymphoma.  2. Severe Iron deficiency - unclear etiology -- will need GI workup  PLAN: -Discussed pt labwork today, 12/14/18; WBC increased to 18.4k and HGB decreased to 10.2. Lymphs abs increased to 14.6k. LDH normal at 136. -Discussed the 12/11/18 CT C/A/P which revealed "Interval progressive disease. Significant increase in adenopathy within the abdomen/pelvis. More mild enlargement of lymph nodes within the chest. 2. No splenomegaly. 3. Aortic Atherosclerosis." -Previously HGB had improved after IV Injectafer, and the plan was for the pt to follow up with GI for an endoscopy, however this has not yet occurred -Strongly encouraged pt to follow up with Dr. Ardis Hughs in GI to pursue endoscopy and/or colonoscopy -Discussed that if the patient's anemia persists after iron correction and GI work up, this would be a stronger indication to consider initiating treatment -The pt will pursue GI evaluation in the interim with Dr. Ardis Hughs, and at our next appointment we will consider indications for treatment again -Goal for ferritin >50 -Discussed that the disease is bulky enough that he meets some indications to consider  treatment. No threat of organ injury at this time. The pt denies any constitutional symptoms and is enjoying a good quality of life. -Will see the pt back in 3 months, sooner if any new concerns   -RTC with Dr Irene Limbo in 12 weeks with labs -Patient given number to call for followup with Dr Ardis Hughs for endoscopy/colonoscopy- preferably done prior to followup with [place].   All of the patients questions were answered with apparent satisfaction. The patient knows to call the clinic with any problems, questions or concerns.  The total time spent in the appt was 25 minutes and more than 50% was on counseling and direct patient cares.   Sullivan Lone MD [place] AAHIVMS [facility] Hematology/Oncology Physician [facility]  (Office):       [phone] (Work cell):  [phone] (Fax):           [phone]  12/14/2018 9:56 AM  I, Baldwin [place], am acting as a scribe for Dr. Sullivan Lone.   .I have reviewed the above documentation for accuracy and completeness, and I agree with the above. Brunetta Genera MD

## 2018-12-14 ENCOUNTER — Inpatient Hospital Stay: Payer: Medicare HMO | Attending: Hematology

## 2018-12-14 ENCOUNTER — Telehealth: Payer: Self-pay | Admitting: Hematology

## 2018-12-14 ENCOUNTER — Inpatient Hospital Stay (HOSPITAL_BASED_OUTPATIENT_CLINIC_OR_DEPARTMENT_OTHER): Payer: Medicare HMO | Admitting: Hematology

## 2018-12-14 ENCOUNTER — Other Ambulatory Visit: Payer: Self-pay

## 2018-12-14 VITALS — BP 143/69 | HR 77 | Temp 97.5°F | Resp 18 | Ht 67.0 in | Wt 181.4 lb

## 2018-12-14 DIAGNOSIS — C9110 Chronic lymphocytic leukemia of B-cell type not having achieved remission: Secondary | ICD-10-CM

## 2018-12-14 DIAGNOSIS — D509 Iron deficiency anemia, unspecified: Secondary | ICD-10-CM | POA: Insufficient documentation

## 2018-12-14 DIAGNOSIS — Z79899 Other long term (current) drug therapy: Secondary | ICD-10-CM | POA: Insufficient documentation

## 2018-12-14 DIAGNOSIS — C8308 Small cell B-cell lymphoma, lymph nodes of multiple sites: Secondary | ICD-10-CM | POA: Diagnosis not present

## 2018-12-14 DIAGNOSIS — D500 Iron deficiency anemia secondary to blood loss (chronic): Secondary | ICD-10-CM

## 2018-12-14 LAB — CMP (CANCER CENTER ONLY)
ALT: 11 U/L (ref 0–44)
AST: 13 U/L — ABNORMAL LOW (ref 15–41)
Albumin: 4 g/dL (ref 3.5–5.0)
Alkaline Phosphatase: 80 U/L (ref 38–126)
Anion gap: 9 (ref 5–15)
BUN: 13 mg/dL (ref 8–23)
CO2: 23 mmol/L (ref 22–32)
Calcium: 8.7 mg/dL — ABNORMAL LOW (ref 8.9–10.3)
Chloride: 108 mmol/L (ref 98–111)
Creatinine: 1.31 mg/dL — ABNORMAL HIGH (ref 0.61–1.24)
GFR, Est AFR Am: >60 mL/min (ref >60)
GFR, Estimated: 58 mL/min — ABNORMAL LOW (ref >60)
Glucose, Bld: 120 mg/dL — ABNORMAL HIGH (ref 70–99)
Potassium: 4 mmol/L (ref 3.5–5.1)
Sodium: 140 mmol/L (ref 135–145)
Total Bilirubin: 0.6 mg/dL (ref 0.3–1.2)
Total Protein: 7 g/dL (ref 6.5–8.1)

## 2018-12-14 LAB — CBC WITH DIFFERENTIAL/PLATELET
Abs Immature Granulocytes: 0.03 10*3/uL (ref 0.00–0.07)
Basophils Absolute: 0 10*3/uL (ref 0.0–0.1)
Basophils Relative: 0 %
Eosinophils Absolute: 0.1 10*3/uL (ref 0.0–0.5)
Eosinophils Relative: 0 %
HCT: 31.5 % — ABNORMAL LOW (ref 39.0–52.0)
Hemoglobin: 10.2 g/dL — ABNORMAL LOW (ref 13.0–17.0)
Immature Granulocytes: 0 %
Lymphocytes Relative: 80 %
Lymphs Abs: 14.6 10*3/uL — ABNORMAL HIGH (ref 0.7–4.0)
MCH: 28.1 pg (ref 26.0–34.0)
MCHC: 32.4 g/dL (ref 30.0–36.0)
MCV: 86.8 fL (ref 80.0–100.0)
Monocytes Absolute: 1 10*3/uL (ref 0.1–1.0)
Monocytes Relative: 5 %
Neutro Abs: 2.7 10*3/uL (ref 1.7–7.7)
Neutrophils Relative %: 15 %
Platelets: 204 10*3/uL (ref 150–400)
RBC: 3.63 MIL/uL — ABNORMAL LOW (ref 4.22–5.81)
RDW: 13.3 % (ref 11.5–15.5)
WBC: 18.4 10*3/uL — ABNORMAL HIGH (ref 4.0–10.5)
nRBC: 0 % (ref 0.0–0.2)

## 2018-12-14 LAB — LACTATE DEHYDROGENASE: LDH: 136 U/L (ref 98–192)

## 2018-12-14 NOTE — Telephone Encounter (Signed)
Scheduled appt per 4/24 los. ° °A calendar will be mailed out. °

## 2018-12-17 ENCOUNTER — Other Ambulatory Visit: Payer: Medicare HMO

## 2018-12-17 ENCOUNTER — Ambulatory Visit: Payer: Medicare HMO | Admitting: Hematology

## 2018-12-18 ENCOUNTER — Other Ambulatory Visit: Payer: Medicare HMO

## 2018-12-18 ENCOUNTER — Ambulatory Visit: Payer: Medicare HMO | Admitting: Hematology

## 2019-03-08 ENCOUNTER — Ambulatory Visit: Payer: Medicare HMO | Admitting: Hematology

## 2019-03-08 ENCOUNTER — Other Ambulatory Visit: Payer: Medicare HMO

## 2019-03-12 ENCOUNTER — Telehealth: Payer: Self-pay | Admitting: Hematology

## 2019-03-12 NOTE — Telephone Encounter (Signed)
[place] [facility] 7/30 appointments moved from 7/30 to 8/7. Confirmed with patient.

## 2019-03-21 ENCOUNTER — Other Ambulatory Visit: Payer: Medicare HMO

## 2019-03-21 ENCOUNTER — Ambulatory Visit: Payer: Medicare HMO | Admitting: Hematology

## 2019-03-29 ENCOUNTER — Other Ambulatory Visit: Payer: Medicare HMO

## 2019-03-29 ENCOUNTER — Ambulatory Visit: Payer: Medicare HMO | Admitting: Hematology

## 2019-04-03 NOTE — Progress Notes (Signed)
HEMATOLOGY/ONCOLOGY CLINIC NOTE  Date of Service:  04/03/19   Patient Care Team: Bernerd Limbo, MD as PCP - General (Family Medicine)  CHIEF COMPLAINTS/PURPOSE OF CONSULTATION:  Small B-Cell Lymphoma  HISTORY OF PRESENTING ILLNESS:   [name] [name] is a wonderful 63 y.o. 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 The pt reports that he is doing well overall.   The pt reports that in August 2018 he felt a knot on the left side of his neck, which he spoke about with his PCP Dr. Coletta Memos. He then had an MRI in January 2019, a CXR in February 2019 and then a biopsy on 10/26/17 (results noted below). He denies any constitutional symptoms as noted below. He notes that the swelling on his neck has not increased in size and is not painful.   He notes that he gets congested at night and is only able to breathe out of one nostril; he denies having a deviated septum. He notes that he has tried to have this worked up for the last 4 years without success and has tried saline sprays without success. He notes that he wakes up with dry mouth.   The pt takes Amlodipine, 81mg  Aspirin, Atorvastatin, Cyclobenzaprine, Ferrousul, Losartan Potassium, Meloxicam, Metformin 500mg , Percocet, Protonix, Potassium Chloride, and Ambein.   Of note prior to the patient's visit today, pt has had Lymph node, needle/core biopsy, Left Supraclavicular completed on 10/31/17 with results revealing SMALL LYMPHOCYTIC LYMPHOMA.  10/26/17 Tissue Flow Cytometry revealed a Monoclonal B Cell population identified.    08/28/17 MRI revealed Extensive cervical adenopathy. Adenopathy also noted in the subpectoral nodes.   CXR on 10/11/17 was revealed to be Normal.   His 10/11/17 CBC revealed all values WNL except for % Lymph at 66.3%, Mono % at 3.3% and % Gran at 30.4%, Lymphs Abs at 6.4k, Hgb at 12.5, HCT at 37.5, MCV at 75.5, MCH at 25.1, RDW at 16.3, MPV at 6.4  On review of  systems, pt reports left cervical lymph node swelling and denies fevers, chills, night sweats, unexpected weight loss, CP, SOB, abdominal pain or swelling, pain along the spine, changes in bowel habits, skin rashes, leg swelling, and any other symptoms.    On PMHx the pt reports Hyperlipidemia, Hypertensive reitonpathy, Hypopotassemia, DM Type 2, acid reflux, spinal stenosis.  On Social Hx the pt reports being a former smoker, having quit in 2012. He notes that he smoked about 5 cigars per week before quitting. He denies consuming much ETOH. He denies any recreational drug use. He denies any unsafe needle exposure.  On Family Hx the pt reports an uncle who had cancer but wasn't sure what kind.   INTERVAL HISTORY:  [name] [name] returns today regarding his Small B Cell Lymphoma. The patient's last visit with [place] was on 12/14/2018. The pt reports some increased fatigue and some night sweats. Increasing neck swelling and some abdominal fullness. No fevers/chills. He has lost 6 lbs since his last visit.  MEDICAL HISTORY:  Past Medical History:  Diagnosis Date  . Allergic rhinitis, cause unspecified   . Backache, unspecified   . Body mass index 33.0-33.9, adult   . Diabetes mellitus without complication ([place])    Type II  . Elevated blood pressure reading without diagnosis of hypertension   . Esophageal reflux   . Generalized pain   . Heartburn   . Insomnia, unspecified   . Nonspecific reaction  to tuberculin skin test without active tuberculosis(795.51)   . Other abnormal blood chemistry   . Other and unspecified hyperlipidemia   . Other dyspnea and respiratory abnormality   . Other nonspecific findings on examination of blood(790.99)   . Pain in joint, lower leg   . Pneumonia    1968  . Sleep apnea    Does not use or own a CPAP  . Tobacco use disorder   . Unspecified essential hypertension     SURGICAL HISTORY: Past Surgical History:  Procedure Laterality Date  . bil wrist  surgery    . ENDOSCOPIC CONCHA BULLOSA RESECTION Bilateral 05/18/2018   Procedure: ENDOSCOPIC CONCHA BULLOSA RESECTION;  Surgeon: Jerrell Belfast, MD;  Location: [facility];  Service: ENT;  Laterality: Bilateral;  . NASAL SEPTOPLASTY W/ TURBINOPLASTY Bilateral 05/18/2018   Procedure: NASAL SEPTOPLASTY WITH TURBINATE REDUCTION;  Surgeon: Jerrell Belfast, MD;  Location: [facility];  Service: ENT;  Laterality: Bilateral;  . ORIF FOREARM FRACTURE     right  . plastic surgery to face    . SINUS ENDO W/FUSION Bilateral 05/18/2018   Procedure: ENDOSCOPIC SINUS SURGERY WITH NAVIGATION;  Surgeon: Jerrell Belfast, MD;  Location: [facility];  Service: ENT;  Laterality: Bilateral;    SOCIAL HISTORY: Social History   Socioeconomic History  . Marital status: Legally Separated    Spouse name: Not on file  . Number of children: 1  . Years of education: Not on file  . Highest education level: Not on file  Occupational History  . Not on file  Social Needs  . Financial resource strain: Not on file  . Food insecurity    Worry: Not on file    Inability: Not on file  . Transportation needs    Medical: Not on file    Non-medical: Not on file  Tobacco Use  . Smoking status: Former Smoker    Packs/day: 0.20    Years: 20.00    Pack years: 4.00    Types: Cigars    Quit date: 2014    Years since quitting: 6.6  . Smokeless tobacco: Never Used  Substance and Sexual Activity  . Alcohol use: Yes    Comment: 2 40oz beers a week  . Drug use: No  . Sexual activity: Not on file  Lifestyle  . Physical activity    Days per week: Not on file    Minutes per session: Not on file  . Stress: Not on file  Relationships  . Social Herbalist on phone: Not on file    Gets together: Not on file    Attends religious service: Not on file    Active member of club or organization: Not on file    Attends meetings of clubs or organizations: Not on file    Relationship status: Not on file  . Intimate partner violence     Fear of current or ex partner: Not on file    Emotionally abused: Not on file    Physically abused: Not on file    Forced sexual activity: Not on file  Other Topics Concern  . Not on file  Social History Narrative  . Not on file    FAMILY HISTORY: Family History  Problem Relation Age of Onset  . Diabetes Mother   . Hypertension Mother   . Cancer Paternal Uncle     ALLERGIES:  is allergic to lisinopril and tuberculin tests.  MEDICATIONS:  Current Outpatient Medications  Medication Sig Dispense Refill  .  amLODipine (NORVASC) 10 MG tablet Take 10 mg by mouth daily.    [name] atorvastatin (LIPITOR) 20 MG tablet Take 20 mg by mouth daily.    . cyclobenzaprine (FLEXERIL) 10 MG tablet Take 10 mg by mouth at bedtime.    [name] losartan (COZAAR) 50 MG tablet Take 50 mg by mouth daily.    . meloxicam (MOBIC) 15 MG tablet Take 15 mg by mouth daily.    . metFORMIN (GLUCOPHAGE) 500 MG tablet Take 500 mg by mouth daily.    [name] oxyCODONE-acetaminophen (PERCOCET/ROXICET) 5-325 MG tablet Take 1 tablet by mouth 3 (three) times daily as needed for pain.  0  . pantoprazole (PROTONIX) 40 MG tablet Take 40 mg by mouth at bedtime.     . polyethylene glycol-electrolytes (NULYTELY/GOLYTELY) 420 g solution Take 4,000 mLs by mouth as directed. 4000 mL 0  . potassium chloride SA (K-DUR,KLOR-CON) 20 MEQ tablet Take 20 mEq by mouth daily.    [name] zolpidem (AMBIEN) 10 MG tablet Take 10 mg by mouth at bedtime.      No current facility-administered medications for this visit.     REVIEW OF SYSTEMS:   A 10+ POINT REVIEW OF SYSTEMS WAS OBTAINED including neurology, dermatology, psychiatry, cardiac, respiratory, lymph, extremities, GI, GU, Musculoskeletal, constitutional, breasts, reproductive, HEENT.  All pertinent positives are noted in the HPI.  All others are negative.     PHYSICAL EXAMINATION: ECOG PERFORMANCE STATUS: 1 - Symptomatic but completely ambulatory  Vitals:   04/04/19 1515  BP: 133/68  Pulse: 68   Resp: 17  Temp: 98.9 F (37.2 C)  SpO2: 100%   Filed Weights   04/04/19 1515  Weight: 175 lb 6.4 oz (79.6 kg)   Body mass index is 27.47 kg/m.  [name] GENERAL:alert, in no acute distress and comfortable SKIN: no acute rashes, no significant lesions EYES: conjunctiva are pink and non-injected, sclera anicteric OROPHARYNX: MMM, no exudates, no oropharyngeal erythema or ulceration NECK: supple, no JVD LYMPH:  B/l significant cervical and axillary LNadenopathy LUNGS: clear to auscultation b/l with normal respiratory effort HEART: regular rate & rhythm ABDOMEN:  normoactive bowel sounds , non tender, not distended. Extremity: no pedal edema PSYCH: alert & oriented x 3 with fluent speech NEURO: no focal motor/sensory deficits   LABORATORY DATA:  I have reviewed the data as listed  . CBC Latest Ref Rng & Units 04/04/2019 12/14/2018 10/16/2018  WBC 4.0 - 10.5 K/uL 24.1(H) 18.4(H) 11.8(H)  Hemoglobin 13.0 - 17.0 g/dL 10.4(L) 10.2(L) 11.1(L)  Hematocrit 39.0 - 52.0 % 31.3(L) 31.5(L) 34.1(L)  Platelets 150 - 400 K/uL 192 204 208    CBC    Component Value Date/Time   WBC 24.1 (H) 04/04/2019 1354   RBC 3.66 (L) 04/04/2019 1354   HGB 10.4 (L) 04/04/2019 1354   HGB 11.9 (L) 11/10/2017 1008   HCT 31.3 (L) 04/04/2019 1354   PLT 192 04/04/2019 1354   PLT 228 11/10/2017 1008   MCV 85.5 04/04/2019 1354   MCH 28.4 04/04/2019 1354   MCHC 33.2 04/04/2019 1354   RDW 15.2 04/04/2019 1354   LYMPHSABS 20.3 (H) 04/04/2019 1354   MONOABS 1.9 (H) 04/04/2019 1354   EOSABS 0.1 04/04/2019 1354   BASOSABS 0.0 04/04/2019 1354    CMP Latest Ref Rng & Units 04/04/2019 12/14/2018 12/11/2018  Glucose 70 - 99 mg/dL 126(H) 120(H) -  BUN 8 - 23 mg/dL 15 13 -  Creatinine 0.61 - 1.24 mg/dL 1.35(H) 1.31(H) 1.20  Sodium 135 - 145 mmol/L 140 140 -  Potassium 3.5 - 5.1 mmol/L 4.2 4.0 -  Chloride 98 - 111 mmol/L 108 108 -  CO2 22 - 32 mmol/L 22 23 -  Calcium 8.9 - 10.3 mg/dL 9.0 8.7(L) -  Total Protein 6.5 -  8.1 g/dL 7.2 7.0 -  Total Bilirubin 0.3 - 1.2 mg/dL 0.7 0.6 -  Alkaline Phos 38 - 126 U/L 96 80 -  AST 15 - 41 U/L 13(L) 13(L) -  ALT 0 - 44 U/L 10 11 -   . Lab Results  Component Value Date   IRON 69 04/04/2019   TIBC 311 04/04/2019   IRONPCTSAT 22 04/04/2019   (Iron and TIBC)  Lab Results  Component Value Date   FERRITIN 110 10/16/2018   . Lab Results  Component Value Date   IRON 67 10/16/2018   TIBC 290 10/16/2018   IRONPCTSAT 23 10/16/2018   (Iron and TIBC)  Lab Results  Component Value Date   FERRITIN 66 04/04/2019    Component     Latest Ref Rng & Units 11/10/2017  Retic Ct Pct     0.8 - 1.8 % 1.3  RBC.     4.20 - 5.82 MIL/uL 4.75  Retic Count, Absolute     34.8 - 93.9 K/uL 61.8  LDH     125 - 245 U/L 152  HCV Ab     0.0 - 0.9 s/co ratio <0.1  HIV Screen 4th Generation wRfx     Non Reactive Non Reactive  Hepatitis B Surface Ag     Negative Negative  Hep B Core Ab, Tot     Negative Negative    10/31/17 Tissue Flow Cytometry:   10/31/17 Lymph Node Needle/Core Biopsy:  11/10/17 Peripheral Blood Flow Cytometry:   11/10/17 FISH CLL Prognostic Panel:      RADIOGRAPHIC STUDIES: I have personally reviewed the radiological images as listed and agreed with the findings in the report.  08/28/17 MRI     No results found.  ASSESSMENT & PLAN:   63 y.o. male with  1. Small B-Cell Lymphoma /Chronic lymphocytic Leukemia.  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 Newly noted Microcytic anemia with drop in hgb from 11.9 to 9.1 with severe iron deficiency. Not primarily related to lymphoma.  12/11/18 CT C/A/P which revealed "Interval progressive disease. Significant increase in adenopathy within the abdomen/pelvis. More mild enlargement of lymph nodes within the chest. 2. No splenomegaly. 3. Aortic Atherosclerosis."  2. Iron deficiency Anemia - unclear etiology -- will need GI workup - he notes that his GI workup was delayed due to  COVID related scheduling issues . Today he was again asked to f/u with his GI doctor to have these scheduled.    PLAN: -labs done today reviewed with patient -he has evidence of clinical progression and evolving symptoms from his Small cell lymphoma and would like to proceed with treatment. -discussed different treatment options including BR vs Gazyva/Venetoclax vs rituxan alone vs Ibrutinib -he chooses to proceed  With BR chemotherapy -PET/CT in 2 weeks for pre-treatment assessment of SLL disease status Chemo-education for BR RTC with Dr Irene Limbo with labs in 3 weeks  FOLLOW UP: PET/CT in 2 weeks RTC with Dr Irene Limbo in 3 weeks Chemo-education in 3 weeks Schedule to start Bendamustine/Rituxan chemotherapy in 3-4 days after MD visit.      All of the patients questions were answered with apparent satisfaction. The patient knows to call the clinic with any problems, questions or concerns.  The total time  spent in the appt was 30 minutes and more than 50% was on counseling and direct patient cares.    Sullivan Lone MD MS AAHIVMS [facility] Hematology/Oncology Physician [facility]  (Office):       [phone] (Work cell):  [phone] (Fax):           [phone]  04/03/2019 2:54 PM   I, Jacqualyn Posey, am acting as a Education administrator for Dr. Sullivan Lone.   .I have reviewed the above documentation for accuracy and completeness, and I agree with the above. Brunetta Genera MD

## 2019-04-04 ENCOUNTER — Inpatient Hospital Stay (HOSPITAL_BASED_OUTPATIENT_CLINIC_OR_DEPARTMENT_OTHER): Payer: Medicare Other | Admitting: Hematology

## 2019-04-04 ENCOUNTER — Telehealth: Payer: Self-pay | Admitting: Hematology

## 2019-04-04 ENCOUNTER — Other Ambulatory Visit: Payer: Self-pay

## 2019-04-04 ENCOUNTER — Inpatient Hospital Stay: Payer: Medicare Other | Attending: Hematology

## 2019-04-04 VITALS — BP 133/68 | HR 68 | Temp 98.9°F | Resp 17 | Ht 67.0 in | Wt 175.4 lb

## 2019-04-04 DIAGNOSIS — C9110 Chronic lymphocytic leukemia of B-cell type not having achieved remission: Secondary | ICD-10-CM

## 2019-04-04 DIAGNOSIS — Z79899 Other long term (current) drug therapy: Secondary | ICD-10-CM | POA: Insufficient documentation

## 2019-04-04 DIAGNOSIS — D500 Iron deficiency anemia secondary to blood loss (chronic): Secondary | ICD-10-CM

## 2019-04-04 DIAGNOSIS — Z7189 Other specified counseling: Secondary | ICD-10-CM | POA: Diagnosis not present

## 2019-04-04 DIAGNOSIS — D509 Iron deficiency anemia, unspecified: Secondary | ICD-10-CM | POA: Insufficient documentation

## 2019-04-04 DIAGNOSIS — C8308 Small cell B-cell lymphoma, lymph nodes of multiple sites: Secondary | ICD-10-CM | POA: Diagnosis not present

## 2019-04-04 LAB — CBC WITH DIFFERENTIAL/PLATELET
Abs Immature Granulocytes: 0.02 10*3/uL (ref 0.00–0.07)
Basophils Absolute: 0 10*3/uL (ref 0.0–0.1)
Basophils Relative: 0 %
Eosinophils Absolute: 0.1 10*3/uL (ref 0.0–0.5)
Eosinophils Relative: 0 %
HCT: 31.3 % — ABNORMAL LOW (ref 39.0–52.0)
Hemoglobin: 10.4 g/dL — ABNORMAL LOW (ref 13.0–17.0)
Immature Granulocytes: 0 %
Lymphocytes Relative: 85 %
Lymphs Abs: 20.3 10*3/uL — ABNORMAL HIGH (ref 0.7–4.0)
MCH: 28.4 pg (ref 26.0–34.0)
MCHC: 33.2 g/dL (ref 30.0–36.0)
MCV: 85.5 fL (ref 80.0–100.0)
Monocytes Absolute: 1.9 10*3/uL — ABNORMAL HIGH (ref 0.1–1.0)
Monocytes Relative: 8 %
Neutro Abs: 1.8 10*3/uL (ref 1.7–7.7)
Neutrophils Relative %: 7 %
Platelets: 192 10*3/uL (ref 150–400)
RBC: 3.66 MIL/uL — ABNORMAL LOW (ref 4.22–5.81)
RDW: 15.2 % (ref 11.5–15.5)
WBC: 24.1 10*3/uL — ABNORMAL HIGH (ref 4.0–10.5)
nRBC: 0 % (ref 0.0–0.2)

## 2019-04-04 LAB — FERRITIN: Ferritin: 66 ng/mL (ref 24–336)

## 2019-04-04 LAB — CMP (CANCER CENTER ONLY)
ALT: 10 U/L (ref 0–44)
AST: 13 U/L — ABNORMAL LOW (ref 15–41)
Albumin: 4.2 g/dL (ref 3.5–5.0)
Alkaline Phosphatase: 96 U/L (ref 38–126)
Anion gap: 10 (ref 5–15)
BUN: 15 mg/dL (ref 8–23)
CO2: 22 mmol/L (ref 22–32)
Calcium: 9 mg/dL (ref 8.9–10.3)
Chloride: 108 mmol/L (ref 98–111)
Creatinine: 1.35 mg/dL — ABNORMAL HIGH (ref 0.61–1.24)
GFR, Est AFR Am: >60 mL/min (ref >60)
GFR, Estimated: 56 mL/min — ABNORMAL LOW (ref >60)
Glucose, Bld: 126 mg/dL — ABNORMAL HIGH (ref 70–99)
Potassium: 4.2 mmol/L (ref 3.5–5.1)
Sodium: 140 mmol/L (ref 135–145)
Total Bilirubin: 0.7 mg/dL (ref 0.3–1.2)
Total Protein: 7.2 g/dL (ref 6.5–8.1)

## 2019-04-04 LAB — IRON AND TIBC
Iron: 69 ug/dL (ref 42–163)
Saturation Ratios: 22 % (ref 20–55)
TIBC: 311 ug/dL (ref 202–409)
UIBC: 242 ug/dL (ref 117–376)

## 2019-04-04 NOTE — Telephone Encounter (Signed)
Scheduled per 08/13 los, patient received avs and calender.

## 2019-04-10 DIAGNOSIS — C9110 Chronic lymphocytic leukemia of B-cell type not having achieved remission: Secondary | ICD-10-CM | POA: Insufficient documentation

## 2019-04-10 DIAGNOSIS — Z7189 Other specified counseling: Secondary | ICD-10-CM | POA: Insufficient documentation

## 2019-04-10 MED ORDER — ACYCLOVIR 400 MG PO TABS: 400.0000 mg | ORAL_TABLET | Freq: Every day | ORAL | 3 refills | Status: DC

## 2019-04-10 MED ORDER — ALLOPURINOL 300 MG PO TABS: 150.0000 mg | ORAL_TABLET | Freq: Every day | ORAL | 3 refills | Status: DC

## 2019-04-10 MED ORDER — ONDANSETRON HCL 8 MG PO TABS: 8.0000 mg | ORAL_TABLET | Freq: Two times a day (BID) | ORAL | 1 refills | Status: DC | PRN

## 2019-04-10 MED ORDER — DEXAMETHASONE 4 MG PO TABS: 8.0000 mg | ORAL_TABLET | Freq: Every day | ORAL | 1 refills | Status: DC

## 2019-04-10 MED ORDER — PROCHLORPERAZINE MALEATE 10 MG PO TABS: 10.0000 mg | ORAL_TABLET | Freq: Four times a day (QID) | ORAL | 1 refills | Status: DC | PRN

## 2019-04-10 NOTE — Progress Notes (Signed)
START OFF PATHWAY REGIMEN - Lymphoma and CLL   [id number]:Bendamustine + Rituximab (100/375) q21 Days:   A cycle is every 21 days:     Bendamustine      Rituximab-xxxx   **Always confirm dose/schedule in your pharmacy ordering system**  Patient Characteristics: Chronic Lymphocytic Leukemia (CLL), First Line, Treatment Indicated, 17p del(-)/Unknown and ATM Mutation Negative/Unknown and TP53 Mutation Negative/Unknown, Age ? 81 or Frail (Any Age) Disease Type: Chronic Lymphocytic Leukemia (CLL) Disease Type: Not Applicable Disease Type: Not Applicable Line of Therapy: First Line RAI Stage: III Treatment Indicated<= Treatment Indicated ATM Mutation Status: Negative 17p Deletion Status: Negative TP53 Mutation Status: Negative Patient Age: ? 50 Patient Condition: Fit Patient Intent of Therapy: Non-Curative / Palliative Intent, Discussed with Patient

## 2019-04-24 ENCOUNTER — Other Ambulatory Visit: Payer: Medicare Other

## 2019-04-24 ENCOUNTER — Ambulatory Visit: Payer: Medicare Other | Admitting: Hematology

## 2019-04-24 NOTE — Progress Notes (Signed)
HEMATOLOGY/ONCOLOGY CLINIC NOTE  Date of Service:  04/24/19   Patient Care Team: Bernerd Limbo, MD as PCP - General (Family Medicine) Brunetta Genera, MD as Consulting Physician (Hematology)   CHIEF COMPLAINTS/PURPOSE OF CONSULTATION:  Small B-Cell Lymphoma   HISTORY OF PRESENTING ILLNESS:   [name] [name] is a wonderful 63 y.o. 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 The pt reports that he is doing well overall.   The pt reports that in August 2018 he felt a knot on the left side of his neck, which he spoke about with his PCP Dr. Coletta Memos. He then had an MRI in January 2019, a CXR in February 2019 and then a biopsy on 10/26/17 (results noted below). He denies any constitutional symptoms as noted below. He notes that the swelling on his neck has not increased in size and is not painful.   He notes that he gets congested at night and is only able to breathe out of one nostril; he denies having a deviated septum. He notes that he has tried to have this worked up for the last 4 years without success and has tried saline sprays without success. He notes that he wakes up with dry mouth.   The pt takes Amlodipine, 81mg  Aspirin, Atorvastatin, Cyclobenzaprine, Ferrousul, Losartan Potassium, Meloxicam, Metformin 500mg , Percocet, Protonix, Potassium Chloride, and Ambein.   Of note prior to the patient's visit today, pt has had Lymph node, needle/core biopsy, Left Supraclavicular completed on 10/31/17 with results revealing SMALL LYMPHOCYTIC LYMPHOMA.  10/26/17 Tissue Flow Cytometry revealed a Monoclonal B Cell population identified.    08/28/17 MRI revealed Extensive cervical adenopathy. Adenopathy also noted in the subpectoral nodes.   CXR on 10/11/17 was revealed to be Normal.   His 10/11/17 CBC revealed all values WNL except for % Lymph at 66.3%, Mono % at 3.3% and % Gran at 30.4%, Lymphs Abs at 6.4k, Hgb at 12.5, HCT at 37.5,  MCV at 75.5, MCH at 25.1, RDW at 16.3, MPV at 6.4  On review of systems, pt reports left cervical lymph node swelling and denies fevers, chills, night sweats, unexpected weight loss, CP, SOB, abdominal pain or swelling, pain along the spine, changes in bowel habits, skin rashes, leg swelling, and any other symptoms.    On PMHx the pt reports Hyperlipidemia, Hypertensive reitonpathy, Hypopotassemia, DM Type 2, acid reflux, spinal stenosis.  On Social Hx the pt reports being a former smoker, having quit in 2012. He notes that he smoked about 5 cigars per week before quitting. He denies consuming much ETOH. He denies any recreational drug use. He denies any unsafe needle exposure.  On Family Hx the pt reports an uncle who had cancer but wasn't sure what kind.   INTERVAL HISTORY: [name] [name] returns today regarding his Small B Cell Lymphoma. The patient's last visit with [place] was on 04/04/2019. The pt reports that he is doing well overall.  The pt reports he is doing well and has no acute new symptoms. Neck LN are somewhat uncomfortable esp when seatbelt rubs against them.  Lab results today (04/04/2019) of CBC w/diff and CMP is as follows: all values are WNL except for WBC at 24.1, RBC at 3.66, HGB at 10.4, HCT at 31.3, Lymphs Abs at 30.3, Monocytes Abs at 1.9, Glucose at 126, Creatinine at 1.35, AST at 13, at GFR non af at 56.  On review of systems, pt reports  no fevers, chills, night sweats or weight loss.    MEDICAL HISTORY:  Past Medical History:  Diagnosis Date  . Allergic rhinitis, cause unspecified   . Backache, unspecified   . Body mass index 33.0-33.9, adult   . Diabetes mellitus without complication ([place])    Type II  . Elevated blood pressure reading without diagnosis of hypertension   . Esophageal reflux   . Generalized pain   . Heartburn   . Insomnia, unspecified   . 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 Other abnormal blood  chemistry   . Other and unspecified hyperlipidemia   . Other dyspnea and respiratory abnormality   . Other nonspecific findings on examination of blood(790.99)   . Pain in joint, lower leg   . Pneumonia    1968  . Sleep apnea    Does not use or own a CPAP  . Tobacco use disorder   . Unspecified essential hypertension     SURGICAL HISTORY: Past Surgical History:  Procedure Laterality Date  . bil wrist surgery    . ENDOSCOPIC CONCHA BULLOSA RESECTION Bilateral 05/18/2018   Procedure: ENDOSCOPIC CONCHA BULLOSA RESECTION;  Surgeon: Jerrell Belfast, MD;  Location: [facility];  Service: ENT;  Laterality: Bilateral;  . NASAL SEPTOPLASTY W/ TURBINOPLASTY Bilateral 05/18/2018   Procedure: NASAL SEPTOPLASTY WITH TURBINATE REDUCTION;  Surgeon: Jerrell Belfast, MD;  Location: [facility];  Service: ENT;  Laterality: Bilateral;  . ORIF FOREARM FRACTURE     right  . plastic surgery to face    . SINUS ENDO W/FUSION Bilateral 05/18/2018   Procedure: ENDOSCOPIC SINUS SURGERY WITH NAVIGATION;  Surgeon: Jerrell Belfast, MD;  Location: [facility];  Service: ENT;  Laterality: Bilateral;    SOCIAL HISTORY: Social History   Socioeconomic History  . Marital status: Legally Separated    Spouse name: Not on file  . Number of children: 1  . Years of education: Not on file  . Highest education level: Not on file  Occupational History  . Not on file  Social Needs  . Financial resource strain: Not on file  . Food insecurity    Worry: Not on file    Inability: Not on file  . Transportation needs    Medical: Not on file    Non-medical: Not on file  Tobacco Use  . Smoking status: Former Smoker    Packs/day: 0.20    Years: 20.00    Pack years: 4.00    Types: Cigars    Quit date: 2014    Years since quitting: 6.6  . Smokeless tobacco: Never Used  Substance and Sexual Activity  . Alcohol use: Yes    Comment: 2 40oz beers a week  . Drug use: No  . Sexual activity: Not on file  Lifestyle  . Physical activity     Days per week: Not on file    Minutes per session: Not on file  . Stress: Not on file  Relationships  . Social Herbalist on phone: Not on file    Gets together: Not on file    Attends religious service: Not on file    Active member of club or organization: Not on file    Attends meetings of clubs or organizations: Not on file    Relationship status: Not on file  . Intimate partner violence    Fear of current or ex partner: Not on file    Emotionally abused: Not on file  Physically abused: Not on file    Forced sexual activity: Not on file  Other Topics Concern  . Not on file  Social History Narrative  . Not on file    FAMILY HISTORY: Family History  Problem Relation Age of Onset  . Diabetes Mother   . Hypertension Mother   . Cancer Paternal Uncle     ALLERGIES:  is allergic to lisinopril and tuberculin tests.  MEDICATIONS:  Current Outpatient Medications  Medication Sig Dispense Refill  . acyclovir (ZOVIRAX) 400 MG tablet Take 1 tablet (400 mg total) by mouth daily. 30 tablet 3  . allopurinol (ZYLOPRIM) 300 MG tablet Take 0.5 tablets (150 mg total) by mouth daily. 30 tablet 3  . amLODipine (NORVASC) 10 MG tablet Take 10 mg by mouth daily.    [name] atorvastatin (LIPITOR) 20 MG tablet Take 20 mg by mouth daily.    . cyclobenzaprine (FLEXERIL) 10 MG tablet Take 10 mg by mouth at bedtime.    [name] dexamethasone (DECADRON) 4 MG tablet Take 2 tablets (8 mg total) by mouth daily. Start the day after bendamustine chemotherapy for 2 days. Take with food. 30 tablet 1  . losartan (COZAAR) 50 MG tablet Take 50 mg by mouth daily.    . meloxicam (MOBIC) 15 MG tablet Take 15 mg by mouth daily.    . metFORMIN (GLUCOPHAGE) 500 MG tablet Take 500 mg by mouth daily.    . ondansetron (ZOFRAN) 8 MG tablet Take 1 tablet (8 mg total) by mouth 2 (two) times daily as needed for refractory nausea / vomiting. Start on day 2 after bendamustine chemotherapy. 30 tablet 1  .  oxyCODONE-acetaminophen (PERCOCET/ROXICET) 5-325 MG tablet Take 1 tablet by mouth 3 (three) times daily as needed for pain.  0  . pantoprazole (PROTONIX) 40 MG tablet Take 40 mg by mouth at bedtime.     . potassium chloride SA (K-DUR,KLOR-CON) 20 MEQ tablet Take 20 mEq by mouth daily.    . prochlorperazine (COMPAZINE) 10 MG tablet Take 1 tablet (10 mg total) by mouth every 6 (six) hours as needed (Nausea or vomiting). 30 tablet 1  . zolpidem (AMBIEN) 10 MG tablet Take 10 mg by mouth at bedtime.      No current facility-administered medications for this visit.     REVIEW OF SYSTEMS:   A 10+ POINT REVIEW OF SYSTEMS WAS OBTAINED including neurology, dermatology, psychiatry, cardiac, respiratory, lymph, extremities, GI, GU, Musculoskeletal, constitutional, breasts, reproductive, HEENT.  All pertinent positives are noted in the HPI.  All others are negative.    PHYSICAL EXAMINATION: ECOG PERFORMANCE STATUS: 1 - Symptomatic but completely ambulatory  Vitals:   04/25/19 0857  BP: (!) 144/72  Pulse: 79  Resp: 18  Temp: 98.5 F (36.9 C)  SpO2: 99%   Filed Weights   04/25/19 0857  Weight: 177 lb 11.2 oz (80.6 kg)   Body mass index is 27.83 kg/m.   GENERAL:alert, in no acute distress and comfortable SKIN: no acute rashes, no significant lesions EYES: conjunctiva are pink and non-injected, sclera anicteric OROPHARYNX: MMM, no exudates, no oropharyngeal erythema or ulceration NECK: supple, no JVD LYMPH:  no palpable lymphadenopathy in the cervical, axillary or inguinal regions LUNGS: clear to auscultation b/l with normal respiratory effort HEART: regular rate & rhythm ABDOMEN:  normoactive bowel sounds , non tender, not distended. Extremity: no pedal edema PSYCH: alert & oriented x 3 with fluent speech NEURO: no focal motor/sensory deficits   LABORATORY DATA:  I have reviewed the data  as listed  . CBC Latest Ref Rng & Units 04/04/2019 12/14/2018 10/16/2018  WBC 4.0 - 10.5 K/uL  24.1(H) 18.4(H) 11.8(H)  Hemoglobin 13.0 - 17.0 g/dL 10.4(L) 10.2(L) 11.1(L)  Hematocrit 39.0 - 52.0 % 31.3(L) 31.5(L) 34.1(L)  Platelets 150 - 400 K/uL 192 204 208    CBC    Component Value Date/Time   WBC 24.1 (H) 04/04/2019 1354   RBC 3.66 (L) 04/04/2019 1354   HGB 10.4 (L) 04/04/2019 1354   HGB 11.9 (L) 11/10/2017 1008   HCT 31.3 (L) 04/04/2019 1354   PLT 192 04/04/2019 1354   PLT 228 11/10/2017 1008   MCV 85.5 04/04/2019 1354   MCH 28.4 04/04/2019 1354   MCHC 33.2 04/04/2019 1354   RDW 15.2 04/04/2019 1354   LYMPHSABS 20.3 (H) 04/04/2019 1354   MONOABS 1.9 (H) 04/04/2019 1354   EOSABS 0.1 04/04/2019 1354   BASOSABS 0.0 04/04/2019 1354    CMP Latest Ref Rng & Units 04/04/2019 12/14/2018 12/11/2018  Glucose 70 - 99 mg/dL 126(H) 120(H) -  BUN 8 - 23 mg/dL 15 13 -  Creatinine 0.61 - 1.24 mg/dL 1.35(H) 1.31(H) 1.20  Sodium 135 - 145 mmol/L 140 140 -  Potassium 3.5 - 5.1 mmol/L 4.2 4.0 -  Chloride 98 - 111 mmol/L 108 108 -  CO2 22 - 32 mmol/L 22 23 -  Calcium 8.9 - 10.3 mg/dL 9.0 8.7(L) -  Total Protein 6.5 - 8.1 g/dL 7.2 7.0 -  Total Bilirubin 0.3 - 1.2 mg/dL 0.7 0.6 -  Alkaline Phos 38 - 126 U/L 96 80 -  AST 15 - 41 U/L 13(L) 13(L) -  ALT 0 - 44 U/L 10 11 -   . Lab Results  Component Value Date   IRON 69 04/04/2019   TIBC 311 04/04/2019   IRONPCTSAT 22 04/04/2019   (Iron and TIBC)  Lab Results  Component Value Date   FERRITIN 66 04/04/2019   . Lab Results  Component Value Date   IRON 69 04/04/2019   TIBC 311 04/04/2019   IRONPCTSAT 22 04/04/2019   (Iron and TIBC)  Lab Results  Component Value Date   FERRITIN 66 04/04/2019    Component     Latest Ref Rng & Units 11/10/2017  Retic Ct Pct     0.8 - 1.8 % 1.3  RBC.     4.20 - 5.82 MIL/uL 4.75  Retic Count, Absolute     34.8 - 93.9 K/uL 61.8  LDH     125 - 245 U/L 152  HCV Ab     0.0 - 0.9 s/co ratio <0.1  HIV Screen 4th Generation wRfx     Non Reactive Non Reactive  Hepatitis B Surface Ag      Negative Negative  Hep B Core Ab, Tot     Negative Negative    10/31/17 Tissue Flow Cytometry:   10/31/17 Lymph Node Needle/Core Biopsy:  11/10/17 Peripheral Blood Flow Cytometry:   11/10/17 FISH CLL Prognostic Panel:      RADIOGRAPHIC STUDIES: I have personally reviewed the radiological images as listed and agreed with the findings in the report.  08/28/17 MRI     No results found.  ASSESSMENT & PLAN:   63 y.o. male with  1. Small B-Cell Lymphoma /Chronic lymphocytic Leukemia.  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 Newly noted Microcytic anemia with drop in hgb from 11.9 to 9.1 with severe iron deficiency. Not primarily related to lymphoma.  12/11/18 CT C/A/P which revealed "  Interval progressive disease. Significant increase in adenopathy within the abdomen/pelvis. More mild enlargement of lymph nodes within the chest. 2. No splenomegaly. 3. Aortic Atherosclerosis."  2. Iron deficiency Anemia - unclear etiology -- will need GI workup - he notes that his GI workup was delayed due to COVID related scheduling issues . Today he was again asked to f/u with his GI doctor to have these scheduled.    PLAN: -Discussed pt labwork today, 04/24/19; all values are WNL except for WBC at 24.1, RBC at 3.66, HGB at 10.4, HCT at 31.3, Lymphs Abs at 30.3, Monocytes Abs at 1.9, Glucose at 126, Creatinine at 1.35, AST at 13, at GFR non af at 56. -PET/CT not scheduled --needs to be done ASAP -f/u for 1st cycle of BR treatment 9/10 and 9/11. (I see only 1 day scheduled it is a 2 day treatment will need to add D1) -labs with C1 of treatment -RTC with Dr Irene Limbo 2 weeks after C1 with labs for toxicity check   The total time spent in the appt was 20 minutes and more than 50% was on counseling and direct patient cares.  All of the patient's questions were answered with apparent satisfaction. The patient knows to call the clinic with any problems, questions or concerns.    Sullivan Lone MD MS AAHIVMS [facility] Hematology/Oncology Physician [facility]  (Office):       [phone] (Work cell):  [phone] (Fax):           [phone]  04/24/2019 8:59 PM  I, Jacqualyn Posey, am acting as a Education administrator for Dr. Sullivan Lone.   .I have reviewed the above documentation for accuracy and completeness, and I agree with the above. Brunetta Genera MD

## 2019-04-25 ENCOUNTER — Telehealth: Payer: Self-pay | Admitting: *Deleted

## 2019-04-25 ENCOUNTER — Inpatient Hospital Stay: Payer: Medicare Other | Attending: Hematology

## 2019-04-25 ENCOUNTER — Inpatient Hospital Stay (HOSPITAL_BASED_OUTPATIENT_CLINIC_OR_DEPARTMENT_OTHER): Payer: Medicare Other | Admitting: Hematology

## 2019-04-25 ENCOUNTER — Telehealth: Payer: Self-pay | Admitting: Hematology

## 2019-04-25 ENCOUNTER — Other Ambulatory Visit: Payer: Self-pay

## 2019-04-25 ENCOUNTER — Ambulatory Visit: Payer: Medicare Other | Admitting: Hematology

## 2019-04-25 VITALS — BP 144/72 | HR 79 | Temp 98.5°F | Resp 18 | Ht 67.0 in | Wt 177.7 lb

## 2019-04-25 DIAGNOSIS — C8308 Small cell B-cell lymphoma, lymph nodes of multiple sites: Secondary | ICD-10-CM | POA: Diagnosis not present

## 2019-04-25 DIAGNOSIS — C9110 Chronic lymphocytic leukemia of B-cell type not having achieved remission: Secondary | ICD-10-CM

## 2019-04-25 DIAGNOSIS — Z79899 Other long term (current) drug therapy: Secondary | ICD-10-CM | POA: Diagnosis not present

## 2019-04-25 DIAGNOSIS — D509 Iron deficiency anemia, unspecified: Secondary | ICD-10-CM | POA: Diagnosis not present

## 2019-04-25 DIAGNOSIS — Z7189 Other specified counseling: Secondary | ICD-10-CM | POA: Diagnosis not present

## 2019-04-25 DIAGNOSIS — I700 Atherosclerosis of aorta: Secondary | ICD-10-CM | POA: Insufficient documentation

## 2019-04-25 MED ORDER — DEXAMETHASONE 4 MG PO TABS: 8.0000 mg | ORAL_TABLET | Freq: Every day | ORAL | 1 refills | Status: DC

## 2019-04-25 MED ORDER — ALLOPURINOL 300 MG PO TABS: 150.0000 mg | ORAL_TABLET | Freq: Every day | ORAL | 3 refills | Status: DC

## 2019-04-25 MED ORDER — ACYCLOVIR 400 MG PO TABS: 400.0000 mg | ORAL_TABLET | Freq: Every day | ORAL | 3 refills | Status: DC

## 2019-04-25 MED ORDER — PROCHLORPERAZINE MALEATE 10 MG PO TABS: 10.0000 mg | ORAL_TABLET | Freq: Four times a day (QID) | ORAL | 1 refills | Status: DC | PRN

## 2019-04-25 MED ORDER — ONDANSETRON HCL 8 MG PO TABS: 8.0000 mg | ORAL_TABLET | Freq: Two times a day (BID) | ORAL | 1 refills | Status: DC | PRN

## 2019-04-25 NOTE — Telephone Encounter (Signed)
Patient called - He picked up his medications at pharmacy and asked to review how to take them. Reviewed Decadron, Acyclovir, Zofran and Compazine instructions with patient. Encouraged patient to contact office if further questions. Patient verbalized understanding.

## 2019-04-25 NOTE — Telephone Encounter (Signed)
Left VM inquiring about his CT scan and that medications MD said he needed were not at the pharmacy when he went there. Forwarded message to Dr. Grier Mitts nurse.

## 2019-04-25 NOTE — Telephone Encounter (Signed)
Scheduled appt per 9/3 los.  Spoke with patient and he is aware of his appt date and time.

## 2019-04-28 NOTE — Progress Notes (Signed)
The following biosimilar Ruxience (rituximab-pvvr) has been selected for use in this patient.  Kennith Center, Pharm.D., CPP 04/28/2019@7 :26 PM

## 2019-05-01 ENCOUNTER — Other Ambulatory Visit: Payer: Self-pay | Admitting: Hematology

## 2019-05-01 ENCOUNTER — Other Ambulatory Visit: Payer: Self-pay | Admitting: *Deleted

## 2019-05-01 DIAGNOSIS — Z7189 Other specified counseling: Secondary | ICD-10-CM

## 2019-05-01 DIAGNOSIS — C9110 Chronic lymphocytic leukemia of B-cell type not having achieved remission: Secondary | ICD-10-CM

## 2019-05-02 ENCOUNTER — Inpatient Hospital Stay: Payer: Medicare Other

## 2019-05-02 ENCOUNTER — Other Ambulatory Visit: Payer: Self-pay

## 2019-05-02 VITALS — BP 132/69 | HR 75 | Temp 98.6°F | Resp 18

## 2019-05-02 DIAGNOSIS — C9110 Chronic lymphocytic leukemia of B-cell type not having achieved remission: Secondary | ICD-10-CM

## 2019-05-02 DIAGNOSIS — Z7189 Other specified counseling: Secondary | ICD-10-CM

## 2019-05-02 DIAGNOSIS — C8308 Small cell B-cell lymphoma, lymph nodes of multiple sites: Secondary | ICD-10-CM | POA: Diagnosis not present

## 2019-05-02 LAB — CMP (CANCER CENTER ONLY)
ALT: 8 U/L (ref 0–44)
AST: 14 U/L — ABNORMAL LOW (ref 15–41)
Albumin: 4.2 g/dL (ref 3.5–5.0)
Alkaline Phosphatase: 89 U/L (ref 38–126)
Anion gap: 7 (ref 5–15)
BUN: 11 mg/dL (ref 8–23)
CO2: 22 mmol/L (ref 22–32)
Calcium: 8.9 mg/dL (ref 8.9–10.3)
Chloride: 108 mmol/L (ref 98–111)
Creatinine: 1.2 mg/dL (ref 0.61–1.24)
GFR, Est AFR Am: >60 mL/min (ref >60)
GFR, Estimated: >60 mL/min (ref >60)
Glucose, Bld: 110 mg/dL — ABNORMAL HIGH (ref 70–99)
Potassium: 4.1 mmol/L (ref 3.5–5.1)
Sodium: 137 mmol/L (ref 135–145)
Total Bilirubin: 0.7 mg/dL (ref 0.3–1.2)
Total Protein: 6.9 g/dL (ref 6.5–8.1)

## 2019-05-02 LAB — CBC WITH DIFFERENTIAL/PLATELET
Abs Immature Granulocytes: 0 10*3/uL (ref 0.00–0.07)
Basophils Absolute: 0 10*3/uL (ref 0.0–0.1)
Basophils Relative: 0 %
Eosinophils Absolute: 0 10*3/uL (ref 0.0–0.5)
Eosinophils Relative: 0 %
HCT: 30 % — ABNORMAL LOW (ref 39.0–52.0)
Hemoglobin: 10.2 g/dL — ABNORMAL LOW (ref 13.0–17.0)
Lymphocytes Relative: 94 %
Lymphs Abs: 27 10*3/uL — ABNORMAL HIGH (ref 0.7–4.0)
MCH: 28.7 pg (ref 26.0–34.0)
MCHC: 34 g/dL (ref 30.0–36.0)
MCV: 84.3 fL (ref 80.0–100.0)
Monocytes Absolute: 0.3 10*3/uL (ref 0.1–1.0)
Monocytes Relative: 1 %
Neutro Abs: 1.4 10*3/uL — ABNORMAL LOW (ref 1.7–17.7)
Neutrophils Relative %: 5 %
Platelets: 187 10*3/uL (ref 150–400)
RBC: 3.56 MIL/uL — ABNORMAL LOW (ref 4.22–5.81)
RDW: 14.6 % (ref 11.5–15.5)
WBC: 28.7 10*3/uL — ABNORMAL HIGH (ref 4.0–10.5)
nRBC: 0 % (ref 0.0–0.2)

## 2019-05-02 LAB — LACTATE DEHYDROGENASE: LDH: 174 U/L (ref 98–192)

## 2019-05-02 MED ORDER — SODIUM CHLORIDE 0.9 % IV SOLN
375.0000 mg/m2 | Freq: Once | INTRAVENOUS | Status: AC
  Administered 2019-05-02: 700 mg via INTRAVENOUS
  Filled 2019-05-02: qty 50

## 2019-05-02 MED ORDER — DEXAMETHASONE SODIUM PHOSPHATE 10 MG/ML IJ SOLN
INTRAMUSCULAR | Status: AC
  Filled 2019-05-02: qty 1

## 2019-05-02 MED ORDER — DIPHENHYDRAMINE HCL 25 MG PO CAPS
ORAL_CAPSULE | ORAL | Status: AC
  Filled 2019-05-02: qty 2

## 2019-05-02 MED ORDER — DEXAMETHASONE SODIUM PHOSPHATE 10 MG/ML IJ SOLN
10.0000 mg | Freq: Once | INTRAMUSCULAR | Status: AC
  Administered 2019-05-02: 10 mg via INTRAVENOUS

## 2019-05-02 MED ORDER — PALONOSETRON HCL INJECTION 0.25 MG/5ML
0.2500 mg | Freq: Once | INTRAVENOUS | Status: AC
  Administered 2019-05-02: 0.25 mg via INTRAVENOUS

## 2019-05-02 MED ORDER — DIPHENHYDRAMINE HCL 25 MG PO CAPS
50.0000 mg | ORAL_CAPSULE | Freq: Once | ORAL | Status: AC
  Administered 2019-05-02: 50 mg via ORAL

## 2019-05-02 MED ORDER — PALONOSETRON HCL INJECTION 0.25 MG/5ML
INTRAVENOUS | Status: AC
  Filled 2019-05-02: qty 5

## 2019-05-02 MED ORDER — ACETAMINOPHEN 325 MG PO TABS
ORAL_TABLET | ORAL | Status: AC
  Filled 2019-05-02: qty 2

## 2019-05-02 MED ORDER — SODIUM CHLORIDE 0.9 % IV SOLN
90.0000 mg/m2 | Freq: Once | INTRAVENOUS | Status: AC
  Administered 2019-05-02: 175 mg via INTRAVENOUS
  Filled 2019-05-02: qty 7

## 2019-05-02 MED ORDER — SODIUM CHLORIDE 0.9 % IV SOLN
Freq: Once | INTRAVENOUS | Status: AC
  Administered 2019-05-02: 11:00:00 via INTRAVENOUS
  Filled 2019-05-02: qty 250

## 2019-05-02 MED ORDER — ACETAMINOPHEN 325 MG PO TABS
650.0000 mg | ORAL_TABLET | Freq: Once | ORAL | Status: AC
  Administered 2019-05-02: 650 mg via ORAL

## 2019-05-02 NOTE — Patient Instructions (Signed)
[facility] Discharge Instructions for Patients Receiving Chemotherapy  Today you received the following chemotherapy agents Rituximab-pvvr (RUXIENCE) & Bendamustine (BENDEKA).  To help prevent nausea and vomiting after your treatment, we encourage you to take your nausea medication as prescribed. Take Compazine first 3 days if you feel nausea.    If you develop nausea and vomiting that is not controlled by your nausea medication, call the clinic.   BELOW ARE SYMPTOMS THAT SHOULD BE REPORTED IMMEDIATELY:  *FEVER GREATER THAN 100.5 F  *CHILLS WITH OR WITHOUT FEVER  NAUSEA AND VOMITING THAT IS NOT CONTROLLED WITH YOUR NAUSEA MEDICATION  *UNUSUAL SHORTNESS OF BREATH  *UNUSUAL BRUISING OR BLEEDING  TENDERNESS IN MOUTH AND THROAT WITH OR WITHOUT PRESENCE OF ULCERS  *URINARY PROBLEMS  *BOWEL PROBLEMS  UNUSUAL RASH Items with * indicate a potential emergency and should be followed up as soon as possible.  Feel free to call the clinic should you have any questions or concerns. The clinic phone number is [phone].  Please show the [place] at check-in to the Emergency Department and triage nurse.  Rituximab injection What is this medicine? RITUXIMAB (ri TUX i mab) is a monoclonal antibody. It is used to treat certain types of cancer like non-Hodgkin lymphoma and chronic lymphocytic leukemia. It is also used to treat rheumatoid arthritis, granulomatosis with polyangiitis (or Wegener's granulomatosis), microscopic polyangiitis, and pemphigus vulgaris. This medicine may be used for other purposes; ask your health care provider or pharmacist if you have questions. COMMON BRAND NAME(S): Rituxan, RUXIENCE What should I tell my health care provider before I take this medicine? They need to know if you have any of these conditions:  heart disease  infection (especially a virus infection such as hepatitis B, chickenpox, cold sores, or herpes)  immune system  problems  irregular heartbeat  kidney disease  low blood counts, like low white cell, platelet, or red cell counts  lung or breathing disease, like asthma  recently received or scheduled to receive a vaccine  an unusual or allergic reaction to rituximab, other medicines, foods, dyes, or preservatives  pregnant or trying to get pregnant  breast-feeding How should I use this medicine? This medicine is for infusion into a vein. It is administered in a hospital or clinic by a specially trained health care professional. A special MedGuide will be given to you by the pharmacist with each prescription and refill. Be sure to read this information carefully each time. Talk to your pediatrician regarding the use of this medicine in children. This medicine is not approved for use in children. Overdosage: If you think you have taken too much of this medicine contact a poison control center or emergency room at once. NOTE: This medicine is only for you. Do not share this medicine with others. What if I miss a dose? It is important not to miss a dose. Call your doctor or health care professional if you are unable to keep an appointment. What may interact with this medicine?  cisplatin  live virus vaccines This list may not describe all possible interactions. Give your health care provider a list of all the medicines, herbs, non-prescription drugs, or dietary supplements you use. Also tell them if you smoke, drink alcohol, or use illegal drugs. Some items may interact with your medicine. What should I watch for while using this medicine? Your condition will be monitored carefully while you are receiving this medicine. You may need blood work done while you are taking this  medicine. This medicine can cause serious allergic reactions. To reduce your risk you may need to take medicine before treatment with this medicine. Take your medicine as directed. In some patients, this medicine may cause a  serious brain infection that may cause death. If you have any problems seeing, thinking, speaking, walking, or standing, tell your healthcare professional right away. If you cannot reach your healthcare professional, urgently seek other source of medical care. Call your doctor or health care professional for advice if you get a fever, chills or sore throat, or other symptoms of a cold or flu. Do not treat yourself. This drug decreases your body's ability to fight infections. Try to avoid being around people who are sick. Do not become pregnant while taking this medicine or for at least 12 months after stopping it. Women should inform their doctor if they wish to become pregnant or think they might be pregnant. There is a potential for serious side effects to an unborn child. Talk to your health care professional or pharmacist for more information. Do not breast-feed an infant while taking this medicine or for at least 6 months after stopping it. What side effects may I notice from receiving this medicine? Side effects that you should report to your doctor or health care professional as soon as possible:  allergic reactions like skin rash, itching or hives; swelling of the face, lips, or tongue  breathing problems  chest pain  changes in vision  diarrhea  headache with fever, neck stiffness, sensitivity to light, nausea, or confusion  fast, irregular heartbeat  loss of memory  low blood counts - this medicine may decrease the number of white blood cells, red blood cells and platelets. You may be at increased risk for infections and bleeding.  mouth sores  problems with balance, talking, or walking  redness, blistering, peeling or loosening of the skin, including inside the mouth  signs of infection - fever or chills, cough, sore throat, pain or difficulty passing urine  signs and symptoms of kidney injury like trouble passing urine or change in the amount of urine  signs and symptoms  of liver injury like dark yellow or brown urine; general ill feeling or flu-like symptoms; light-colored stools; loss of appetite; nausea; right upper belly pain; unusually weak or tired; yellowing of the eyes or skin  signs and symptoms of low blood pressure like dizziness; feeling faint or lightheaded, falls; unusually weak or tired  stomach pain  swelling of the ankles, feet, hands  unusual bleeding or bruising  vomiting Side effects that usually do not require medical attention (report to your doctor or health care professional if they continue or are bothersome):  headache  joint pain  muscle cramps or muscle pain  nausea  tiredness This list may not describe all possible side effects. Call your doctor for medical advice about side effects. You may report side effects to FDA at 1-800-FDA-1088. Where should I keep my medicine? This drug is given in a hospital or clinic and will not be stored at home. NOTE: This sheet is a summary. It may not cover all possible information. If you have questions about this medicine, talk to your doctor, pharmacist, or health care provider.  2020 Elsevier/Gold Standard (2018-09-19 22:01:36)  Bendamustine Injection What is this medicine? BENDAMUSTINE (BEN da MUS teen) is a chemotherapy drug. It is used to treat chronic lymphocytic leukemia and non-Hodgkin lymphoma. This medicine may be used for other purposes; ask your health care provider or pharmacist  if you have questions. COMMON BRAND NAME(S): Kristine Royal, Treanda What should I tell my health care provider before I take this medicine? They need to know if you have any of these conditions:  infection (especially a virus infection such as chickenpox, cold sores, or herpes)  kidney disease  liver disease  an unusual or allergic reaction to bendamustine, mannitol, other medicines, foods, dyes, or preservatives  pregnant or trying to get pregnant  breast-feeding How should I use  this medicine? This medicine is for infusion into a vein. It is given by a health care professional in a hospital or clinic setting. Talk to your pediatrician regarding the use of this medicine in children. Special care may be needed. Overdosage: If you think you have taken too much of this medicine contact a poison control center or emergency room at once. NOTE: This medicine is only for you. Do not share this medicine with others. What if I miss a dose? It is important not to miss your dose. Call your doctor or health care professional if you are unable to keep an appointment. What may interact with this medicine? Do not take this medicine with any of the following medications:  clozapine This medicine may also interact with the following medications:  atazanavir  cimetidine  ciprofloxacin  enoxacin  fluvoxamine  medicines for seizures like carbamazepine and phenobarbital  mexiletine  rifampin  tacrine  thiabendazole  zileuton This list may not describe all possible interactions. Give your health care provider a list of all the medicines, herbs, non-prescription drugs, or dietary supplements you use. Also tell them if you smoke, drink alcohol, or use illegal drugs. Some items may interact with your medicine. What should I watch for while using this medicine? This drug may make you feel generally unwell. This is not uncommon, as chemotherapy can affect healthy cells as well as cancer cells. Report any side effects. Continue your course of treatment even though you feel ill unless your doctor tells you to stop. You may need blood work done while you are taking this medicine. Call your doctor or healthcare provider for advice if you get a fever, chills or sore throat, or other symptoms of a cold or flu. Do not treat yourself. This drug decreases your body's ability to fight infections. Try to avoid being around people who are sick. This medicine may cause serious skin reactions.  They can happen weeks to months after starting the medicine. Contact your healthcare provider right away if you notice fevers or flu-like symptoms with a rash. The rash may be red or purple and then turn into blisters or peeling of the skin. Or, you might notice a red rash with swelling of the face, lips or lymph nodes in your neck or under your arms. This medicine may increase your risk to bruise or bleed. Call your doctor or healthcare provider if you notice any unusual bleeding. Talk to your doctor about your risk of cancer. You may be more at risk for certain types of cancers if you take this medicine. Do not become pregnant while taking this medicine or for at least 6 months after stopping it. Women should inform their doctor if they wish to become pregnant or think they might be pregnant. Men should not father a child while taking this medicine and for at least 3 months after stopping it. There is a potential for serious side effects to an unborn child. Talk to your healthcare provider or pharmacist for more information. Do not  breast-feed an infant while taking this medicine or for at least 1 week after stopping it. This medicine may make it more difficult to father a child. You should talk with your doctor or healthcare provider if you are concerned about your fertility. What side effects may I notice from receiving this medicine? Side effects that you should report to your doctor or health care professional as soon as possible:  allergic reactions like skin rash, itching or hives, swelling of the face, lips, or tongue  low blood counts - this medicine may decrease the number of white blood cells, red blood cells and platelets. You may be at increased risk for infections and bleeding.  rash, fever, and swollen lymph nodes  redness, blistering, peeling, or loosening of the skin, including inside the mouth  signs of infection like fever or chills, cough, sore throat, pain or difficulty passing  urine  signs of decreased platelets or bleeding like bruising, pinpoint red spots on the skin, black, tarry stools, blood in the urine  signs of decreased red blood cells like being unusually weak or tired, fainting spells, lightheadedness  signs and symptoms of kidney injury like trouble passing urine or change in the amount of urine  signs and symptoms of liver injury like dark yellow or brown urine; general ill feeling or flu-like symptoms; light-colored stools; loss of appetite; nausea; right upper belly pain; unusually weak or tired; yellowing of the eyes or skin Side effects that usually do not require medical attention (report to your doctor or health care professional if they continue or are bothersome):  constipation  decreased appetite  diarrhea  headache  mouth sores  nausea, vomiting  tiredness This list may not describe all possible side effects. Call your doctor for medical advice about side effects. You may report side effects to FDA at 1-800-FDA-1088. Where should I keep my medicine? This drug is given in a hospital or clinic and will not be stored at home. NOTE: This sheet is a summary. It may not cover all possible information. If you have questions about this medicine, talk to your doctor, pharmacist, or health care provider.  2020 Elsevier/Gold Standard (2018-10-30 10:26:46)  Coronavirus (COVID-19) Are you at risk?  Are you at risk for the Coronavirus (COVID-19)?  To be considered HIGH RISK for Coronavirus (COVID-19), you have to meet the following criteria:  . Traveled to [place], [place], [place], [place] or [place]; or in the [place] to [place], [place], [place], or [place]; and have fever, cough, and shortness of breath within the last 2 weeks of travel OR . Been in close contact with a person diagnosed with COVID-19 within the last 2 weeks and have fever, cough, and shortness of breath . IF YOU DO NOT MEET THESE CRITERIA, YOU ARE CONSIDERED  LOW RISK FOR COVID-19.  What to do if you are HIGH RISK for COVID-19?  [name] If you are having a medical emergency, call 911. . Seek medical care right away. Before you go to a doctor's office, urgent care or emergency department, call ahead and tell them about your recent travel, contact with someone diagnosed with COVID-19, and your symptoms. You should receive instructions from your physician's office regarding next steps of care.  . When you arrive at healthcare provider, tell the healthcare staff immediately you have returned from visiting [place], [place], [place], [place] or [place]; or traveled in the [place] to [place], [place], [place], or [place]; in the last two  weeks or you have been in close contact with a person diagnosed with COVID-19 in the last 2 weeks.   . Tell the health care staff about your symptoms: fever, cough and shortness of breath. . After you have been seen by a medical provider, you will be either: o Tested for (COVID-19) and discharged home on quarantine except to seek medical care if symptoms worsen, and asked to  - Stay home and avoid contact with others until you get your results (4-5 days)  - Avoid travel on public transportation if possible (such as bus, train, or airplane) or o Sent to the Emergency Department by EMS for evaluation, COVID-19 testing, and possible admission depending on your condition and test results.  What to do if you are LOW RISK for COVID-19?  Reduce your risk of any infection by using the same precautions used for avoiding the common cold or flu:  [name] Wash your hands often with soap and warm water for at least 20 seconds.  If soap and water are not readily available, use an alcohol-based hand sanitizer with at least 60% alcohol.  . If coughing or sneezing, cover your mouth and nose by coughing or sneezing into the elbow areas of your shirt or coat, into a tissue or into your sleeve (not your hands). . Avoid shaking hands with others  and consider head nods or verbal greetings only. . Avoid touching your eyes, nose, or mouth with unwashed hands.  . Avoid close contact with people who are sick. . Avoid places or events with large numbers of people in one location, like concerts or sporting events. . Carefully consider travel plans you have or are making. . If you are planning any travel outside or inside the [place], visit the CDC's Travelers' Health webpage for the latest health notices. . If you have some symptoms but not all symptoms, continue to monitor at home and seek medical attention if your symptoms worsen. . If you are having a medical emergency, call 911.   [place] / e-Visit: [email or web address]         [facility]: [facility]: [id number]                   [facility]: [phone]

## 2019-05-02 NOTE — Progress Notes (Signed)
Verbal order from Dr. Irene Limbo; okay to treat patient with ANC of 1.4

## 2019-05-03 ENCOUNTER — Other Ambulatory Visit: Payer: Self-pay

## 2019-05-03 ENCOUNTER — Inpatient Hospital Stay: Payer: Medicare Other

## 2019-05-03 VITALS — BP 148/69 | HR 78 | Temp 98.7°F | Resp 18

## 2019-05-03 DIAGNOSIS — Z7189 Other specified counseling: Secondary | ICD-10-CM

## 2019-05-03 DIAGNOSIS — C8308 Small cell B-cell lymphoma, lymph nodes of multiple sites: Secondary | ICD-10-CM | POA: Diagnosis not present

## 2019-05-03 DIAGNOSIS — C9110 Chronic lymphocytic leukemia of B-cell type not having achieved remission: Secondary | ICD-10-CM

## 2019-05-03 LAB — HEPATITIS B CORE ANTIBODY, TOTAL: Hep B Core Total Ab: NEGATIVE

## 2019-05-03 LAB — HEPATITIS B SURFACE ANTIGEN: Hepatitis B Surface Ag: NEGATIVE

## 2019-05-03 MED ORDER — SODIUM CHLORIDE 0.9 % IV SOLN
90.0000 mg/m2 | Freq: Once | INTRAVENOUS | Status: AC
  Administered 2019-05-03: 175 mg via INTRAVENOUS
  Filled 2019-05-03: qty 7

## 2019-05-03 MED ORDER — SODIUM CHLORIDE 0.9 % IV SOLN
Freq: Once | INTRAVENOUS | Status: AC
  Administered 2019-05-03: 13:00:00 via INTRAVENOUS
  Filled 2019-05-03: qty 250

## 2019-05-03 MED ORDER — DEXAMETHASONE SODIUM PHOSPHATE 10 MG/ML IJ SOLN
10.0000 mg | Freq: Once | INTRAMUSCULAR | Status: AC
  Administered 2019-05-03: 10 mg via INTRAVENOUS

## 2019-05-03 MED ORDER — DEXAMETHASONE SODIUM PHOSPHATE 10 MG/ML IJ SOLN
INTRAMUSCULAR | Status: AC
  Filled 2019-05-03: qty 1

## 2019-05-03 NOTE — Patient Instructions (Signed)
[facility] Discharge Instructions for Patients Receiving Chemotherapy  Today you received the following chemotherapy agents Bendeka.  To help prevent nausea and vomiting after your treatment, we encourage you to take your nausea medication as directed.   If you develop nausea and vomiting that is not controlled by your nausea medication, call the clinic.   BELOW ARE SYMPTOMS THAT SHOULD BE REPORTED IMMEDIATELY:  *FEVER GREATER THAN 100.5 F  *CHILLS WITH OR WITHOUT FEVER  NAUSEA AND VOMITING THAT IS NOT CONTROLLED WITH YOUR NAUSEA MEDICATION  *UNUSUAL SHORTNESS OF BREATH  *UNUSUAL BRUISING OR BLEEDING  TENDERNESS IN MOUTH AND THROAT WITH OR WITHOUT PRESENCE OF ULCERS  *URINARY PROBLEMS  *BOWEL PROBLEMS  UNUSUAL RASH Items with * indicate a potential emergency and should be followed up as soon as possible.  Feel free to call the clinic should you have any questions or concerns. The clinic phone number is [phone].  Please show the CHEMO ALERT CARD at check-in to the Emergency Department and triage nurse.   

## 2019-05-15 ENCOUNTER — Ambulatory Visit (HOSPITAL_COMMUNITY)
Admission: RE | Admit: 2019-05-15 | Discharge: 2019-05-15 | Disposition: A | Discharge status: Home / Self Care | Payer: Medicare Other | Source: Ambulatory Visit | Attending: Hematology | Admitting: Hematology

## 2019-05-15 ENCOUNTER — Other Ambulatory Visit: Payer: Self-pay

## 2019-05-15 DIAGNOSIS — Z87891 Personal history of nicotine dependence: Secondary | ICD-10-CM | POA: Diagnosis not present

## 2019-05-15 DIAGNOSIS — G4730 Sleep apnea, unspecified: Secondary | ICD-10-CM | POA: Diagnosis not present

## 2019-05-15 DIAGNOSIS — C9110 Chronic lymphocytic leukemia of B-cell type not having achieved remission: Secondary | ICD-10-CM | POA: Diagnosis not present

## 2019-05-15 DIAGNOSIS — Z79899 Other long term (current) drug therapy: Secondary | ICD-10-CM | POA: Insufficient documentation

## 2019-05-15 DIAGNOSIS — Z7984 Long term (current) use of oral hypoglycemic drugs: Secondary | ICD-10-CM | POA: Diagnosis not present

## 2019-05-15 DIAGNOSIS — K219 Gastro-esophageal reflux disease without esophagitis: Secondary | ICD-10-CM | POA: Diagnosis not present

## 2019-05-15 DIAGNOSIS — R590 Localized enlarged lymph nodes: Secondary | ICD-10-CM | POA: Diagnosis not present

## 2019-05-15 DIAGNOSIS — E119 Type 2 diabetes mellitus without complications: Secondary | ICD-10-CM | POA: Insufficient documentation

## 2019-05-15 DIAGNOSIS — R911 Solitary pulmonary nodule: Secondary | ICD-10-CM | POA: Insufficient documentation

## 2019-05-15 LAB — GLUCOSE, CAPILLARY: Glucose-Capillary: 104 mg/dL — ABNORMAL HIGH (ref 70–99)

## 2019-05-15 MED ORDER — FLUDEOXYGLUCOSE F - 18 (FDG) INJECTION
9.0000 | Freq: Once | INTRAVENOUS | Status: AC
  Administered 2019-05-15: 9 via INTRAVENOUS

## 2019-05-16 ENCOUNTER — Inpatient Hospital Stay (HOSPITAL_BASED_OUTPATIENT_CLINIC_OR_DEPARTMENT_OTHER): Payer: Medicare Other | Admitting: Hematology

## 2019-05-16 ENCOUNTER — Encounter: Payer: Self-pay | Admitting: Hematology

## 2019-05-16 ENCOUNTER — Other Ambulatory Visit: Payer: Self-pay

## 2019-05-16 ENCOUNTER — Inpatient Hospital Stay: Payer: Medicare Other

## 2019-05-16 ENCOUNTER — Telehealth: Payer: Self-pay | Admitting: Hematology

## 2019-05-16 VITALS — BP 149/64 | HR 76 | Temp 99.4°F | Resp 18 | Ht 67.0 in | Wt 174.3 lb

## 2019-05-16 DIAGNOSIS — C8308 Small cell B-cell lymphoma, lymph nodes of multiple sites: Secondary | ICD-10-CM | POA: Diagnosis not present

## 2019-05-16 DIAGNOSIS — R300 Dysuria: Secondary | ICD-10-CM

## 2019-05-16 DIAGNOSIS — C9110 Chronic lymphocytic leukemia of B-cell type not having achieved remission: Secondary | ICD-10-CM

## 2019-05-16 DIAGNOSIS — D649 Anemia, unspecified: Secondary | ICD-10-CM | POA: Diagnosis not present

## 2019-05-16 LAB — CBC WITH DIFFERENTIAL/PLATELET
Abs Immature Granulocytes: 0 10*3/uL (ref 0.00–0.07)
Basophils Absolute: 0 10*3/uL (ref 0.0–0.1)
Basophils Relative: 0 %
Eosinophils Absolute: 0 10*3/uL (ref 0.0–0.5)
Eosinophils Relative: 1 %
HCT: 25.3 % — ABNORMAL LOW (ref 39.0–52.0)
Hemoglobin: 8.6 g/dL — ABNORMAL LOW (ref 13.0–17.0)
Immature Granulocytes: 0 %
Lymphocytes Relative: 30 %
Lymphs Abs: 0.4 10*3/uL — ABNORMAL LOW (ref 0.7–4.0)
MCH: 29.5 pg (ref 26.0–34.0)
MCHC: 34 g/dL (ref 30.0–36.0)
MCV: 86.6 fL (ref 80.0–100.0)
Monocytes Absolute: 0 10*3/uL — ABNORMAL LOW (ref 0.1–1.0)
Monocytes Relative: 2 %
Neutro Abs: 1 10*3/uL — ABNORMAL LOW (ref 1.7–7.7)
Neutrophils Relative %: 67 %
Platelets: 209 10*3/uL (ref 150–400)
RBC: 2.92 MIL/uL — ABNORMAL LOW (ref 4.22–5.81)
RDW: 13.9 % (ref 11.5–15.5)
WBC: 1.5 10*3/uL — ABNORMAL LOW (ref 4.0–10.5)
nRBC: 0 % (ref 0.0–0.2)

## 2019-05-16 LAB — URINALYSIS, COMPLETE (UACMP) WITH MICROSCOPIC
Bacteria, UA: NONE SEEN
Bilirubin Urine: NEGATIVE
Glucose, UA: 150 mg/dL — AB
Hgb urine dipstick: NEGATIVE
Ketones, ur: NEGATIVE mg/dL
Leukocytes,Ua: NEGATIVE
Nitrite: NEGATIVE
Protein, ur: NEGATIVE mg/dL
Specific Gravity, Urine: 1.012 (ref 1.005–1.030)
pH: 6 (ref 5.0–8.0)

## 2019-05-16 LAB — CMP (CANCER CENTER ONLY)
ALT: 8 U/L (ref 0–44)
AST: 9 U/L — ABNORMAL LOW (ref 15–41)
Albumin: 3.7 g/dL (ref 3.5–5.0)
Alkaline Phosphatase: 56 U/L (ref 38–126)
Anion gap: 6 (ref 5–15)
BUN: 10 mg/dL (ref 8–23)
CO2: 25 mmol/L (ref 22–32)
Calcium: 8.2 mg/dL — ABNORMAL LOW (ref 8.9–10.3)
Chloride: 97 mmol/L — ABNORMAL LOW (ref 98–111)
Creatinine: 1.2 mg/dL (ref 0.61–1.24)
GFR, Est AFR Am: >60 mL/min (ref >60)
GFR, Estimated: >60 mL/min (ref >60)
Glucose, Bld: 101 mg/dL — ABNORMAL HIGH (ref 70–99)
Potassium: 4 mmol/L (ref 3.5–5.1)
Sodium: 128 mmol/L — ABNORMAL LOW (ref 135–145)
Total Bilirubin: 1.2 mg/dL (ref 0.3–1.2)
Total Protein: 6.4 g/dL — ABNORMAL LOW (ref 6.5–8.1)

## 2019-05-16 LAB — URIC ACID: Uric Acid, Serum: 4.3 mg/dL (ref 3.7–8.6)

## 2019-05-16 LAB — LACTATE DEHYDROGENASE: LDH: 119 U/L (ref 98–192)

## 2019-05-16 NOTE — Progress Notes (Signed)
Patient called regarding one-time $700 [place] and copay assistance.  Advised patient to bring proof of income at next visit 05/30/19. He verbalized understanding.  He has my card for any additional financial questions or concerns.

## 2019-05-16 NOTE — Progress Notes (Signed)
Met with patient at registration to introduce myself as Financial Resource Specialist and to offer available resources. ° °Discussed one-time $700 [facility] grant and qualifications to assist with personal expenses while going through treatment. ° °Also, discussed available copay assistance for his diagnosis for treatment drugs. ° °Gave him my card if interested in applying and for any additional financial questions or concerns. He verbalized understanding.   °

## 2019-05-16 NOTE — Telephone Encounter (Signed)
Scheduled appt per 9/24 los. ° °Printed calendar and avs. °

## 2019-05-16 NOTE — Progress Notes (Signed)
HEMATOLOGY/ONCOLOGY CLINIC NOTE  Date of Service:  05/16/19   Patient Care Team: Bernerd Limbo, MD as PCP - General (Family Medicine) Brunetta Genera, MD as Consulting Physician (Hematology)   CHIEF COMPLAINTS/PURPOSE OF CONSULTATION:  Small B-Cell Lymphoma   HISTORY OF PRESENTING ILLNESS:   [name] [name] is a wonderful 63 y.o. 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 The pt reports that he is doing well overall.   The pt reports that in August 2018 he felt a knot on the left side of his neck, which he spoke about with his PCP Dr. Coletta Memos. He then had an MRI in January 2019, a CXR in February 2019 and then a biopsy on 10/26/17 (results noted below). He denies any constitutional symptoms as noted below. He notes that the swelling on his neck has not increased in size and is not painful.   He notes that he gets congested at night and is only able to breathe out of one nostril; he denies having a deviated septum. He notes that he has tried to have this worked up for the last 4 years without success and has tried saline sprays without success. He notes that he wakes up with dry mouth.   The pt takes Amlodipine, 81mg  Aspirin, Atorvastatin, Cyclobenzaprine, Ferrousul, Losartan Potassium, Meloxicam, Metformin 500mg , Percocet, Protonix, Potassium Chloride, and Ambein.   Of note prior to the patient's visit today, pt has had Lymph node, needle/core biopsy, Left Supraclavicular completed on 10/31/17 with results revealing SMALL LYMPHOCYTIC LYMPHOMA.  10/26/17 Tissue Flow Cytometry revealed a Monoclonal B Cell population identified.    08/28/17 MRI revealed Extensive cervical adenopathy. Adenopathy also noted in the subpectoral nodes.   CXR on 10/11/17 was revealed to be Normal.   His 10/11/17 CBC revealed all values WNL except for % Lymph at 66.3%, Mono % at 3.3% and % Gran at 30.4%, Lymphs Abs at 6.4k, Hgb at 12.5, HCT at 37.5,  MCV at 75.5, MCH at 25.1, RDW at 16.3, MPV at 6.4  On review of systems, pt reports left cervical lymph node swelling and denies fevers, chills, night sweats, unexpected weight loss, CP, SOB, abdominal pain or swelling, pain along the spine, changes in bowel habits, skin rashes, leg swelling, and any other symptoms.    On PMHx the pt reports Hyperlipidemia, Hypertensive reitonpathy, Hypopotassemia, DM Type 2, acid reflux, spinal stenosis.  On Social Hx the pt reports being a former smoker, having quit in 2012. He notes that he smoked about 5 cigars per week before quitting. He denies consuming much ETOH. He denies any recreational drug use. He denies any unsafe needle exposure.  On Family Hx the pt reports an uncle who had cancer but wasn't sure what kind.   INTERVAL HISTORY: [name] [name] returns today regarding his Small B Cell Lymphoma. The patient's last visit with [place] was on 04/25/2019. The pt reports that he is doing well overall.  Day 14 follow-up to   The pt reports he stopped taking all of the medications a few days ago due to excessive urination. Started the Wednesday after treatment.  He does not check blood sugar levels at home. Pt states that he drinks more that 60oz of water daily and he drinks caffeine free sodas.  Of note since the patient's last visit, pt has had PET scan completed on 05/15/2019 with results revealing "1. New focal nodular hypermetabolic left upper lobe pulmonary nodule,  maximum SUV 12.2 compatible with Deauville 5 activity. There is also some worsening in Deauville 4 right upper paratracheal Adenopathy. 2. In the neck, there continue to be widespread enlarged lymph nodes similar to the prior exam, currently Deauville 3. 3. In the axillary regions, adenopathy is mildly reduced both in size and activity, currently Deauville 2.4. In the abdomen, bulky adenopathy appears mildly increased in size in some locations, but still in the Deauville 2 to Deauville 3  range." ".  Lab results today (05/16/19) of CBC w/diff and CMP is as follows: all values are WNL except for WBC at 1.5, RBC at 2.92, Hemoglobin at 8.6, HCT at 25.3, Neutro Abs at 1.0, Lymphs Abs at 0.4, Monocytes absolute at 0.0, sodium at 128, Chloride at 97, Glucose at 101, Calcium at 8.2, Total protein at 6.4, and AST at 9. LDH 119 Pending lab results fo LDH, Magnesium, and Urinalysis   On review of systems, pt reports weight loss, chills on 05/13/2019,  and denies pain when urinating, blood in urine, fever, night sweats, runny nose, diarrhea, back pain, abdominal pain and any other symptoms.    MEDICAL HISTORY:  Past Medical History:  Diagnosis Date   Allergic rhinitis, cause unspecified    Backache, unspecified    Body mass index 33.0-33.9, adult    Diabetes mellitus without complication (HCC)    Type II   Elevated blood pressure reading without diagnosis of hypertension    Esophageal reflux    Generalized pain    Heartburn    Insomnia, unspecified    Nonspecific reaction to tuberculin skin test without active tuberculosis(795.51)    Other abnormal blood chemistry    Other and unspecified hyperlipidemia    Other dyspnea and respiratory abnormality    Other nonspecific findings on examination of blood(790.99)    Pain in joint, lower leg    Pneumonia    1968   Sleep apnea    Does not use or own a CPAP   Tobacco use disorder    Unspecified essential hypertension     SURGICAL HISTORY: Past Surgical History:  Procedure Laterality Date   bil wrist surgery     ENDOSCOPIC CONCHA BULLOSA RESECTION Bilateral 05/18/2018   Procedure: ENDOSCOPIC CONCHA BULLOSA RESECTION;  Surgeon: Jerrell Belfast, MD;  Location: [place];  Service: ENT;  Laterality: Bilateral;   NASAL SEPTOPLASTY W/ TURBINOPLASTY Bilateral 05/18/2018   Procedure: NASAL SEPTOPLASTY WITH TURBINATE REDUCTION;  Surgeon: Jerrell Belfast, MD;  Location: [place];  Service: ENT;  Laterality: Bilateral;    ORIF FOREARM FRACTURE     right   plastic surgery to face     SINUS ENDO W/FUSION Bilateral 05/18/2018   Procedure: ENDOSCOPIC SINUS SURGERY WITH NAVIGATION;  Surgeon: Jerrell Belfast, MD;  Location[facility] OR;  Service: ENT;  Laterality: Bilateral;    SOCIAL HISTORY: Social History   Socioeconomic History   Marital status: Legally Separated    Spouse name: Not on file   Number of children: 1   Years of education: Not on file   Highest education level: Not on file  Occupational History   Not on file  Social Needs   Financial resource strain: Not on file   Food insecurity    Worry: Not on file    Inability: Not on file   Transportation needs    Medical: Not on file    Non-medical: Not on file  Tobacco Use   Smoking status: Former Smoker    Packs/day: 0.20    Years: 20.00  Pack years: 4.00    Types: Cigars    Quit date: 2014    Years since quitting: 6.7   Smokeless tobacco: Never Used  Substance and Sexual Activity   Alcohol use: Yes    Comment: 2 40oz beers a week   Drug use: No   Sexual activity: Not on file  Lifestyle   Physical activity    Days per week: Not on file    Minutes per session: Not on file   Stress: Not on file  Relationships   Social connections    Talks on phone: Not on file    Gets together: Not on file    Attends religious service: Not on file    Active member of club or organization: Not on file    Attends meetings of clubs or organizations: Not on file    Relationship status: Not on file   Intimate partner violence    Fear of current or ex partner: Not on file    Emotionally abused: Not on file    Physically abused: Not on file    Forced sexual activity: Not on file  Other Topics Concern   Not on file  Social History Narrative   Not on file    FAMILY HISTORY: Family History  Problem Relation Age of Onset   Diabetes Mother    Hypertension Mother    Cancer Paternal Uncle     ALLERGIES:  is allergic to  lisinopril and tuberculin tests.  MEDICATIONS:  Current Outpatient Medications  Medication Sig Dispense Refill   acyclovir (ZOVIRAX) 400 MG tablet Take 1 tablet (400 mg total) by mouth daily. 30 tablet 3   allopurinol (ZYLOPRIM) 300 MG tablet Take 0.5 tablets (150 mg total) by mouth daily. 30 tablet 3   amLODipine (NORVASC) 10 MG tablet Take 10 mg by mouth daily.     atorvastatin (LIPITOR) 20 MG tablet Take 20 mg by mouth daily.     cyclobenzaprine (FLEXERIL) 10 MG tablet Take 10 mg by mouth at bedtime.     dexamethasone (DECADRON) 4 MG tablet Take 2 tablets (8 mg total) by mouth daily. Start the day after bendamustine chemotherapy for 2 days. Take with food. 30 tablet 1   losartan (COZAAR) 50 MG tablet Take 50 mg by mouth daily.     meloxicam (MOBIC) 15 MG tablet Take 15 mg by mouth daily.     metFORMIN (GLUCOPHAGE) 500 MG tablet Take 500 mg by mouth daily.     ondansetron (ZOFRAN) 8 MG tablet Take 1 tablet (8 mg total) by mouth 2 (two) times daily as needed for refractory nausea / vomiting. Start on day 2 after bendamustine chemotherapy. 30 tablet 1   oxyCODONE-acetaminophen (PERCOCET/ROXICET) 5-325 MG tablet Take 1 tablet by mouth 3 (three) times daily as needed for pain.  0   pantoprazole (PROTONIX) 40 MG tablet Take 40 mg by mouth at bedtime.      potassium chloride SA (K-DUR,KLOR-CON) 20 MEQ tablet Take 20 mEq by mouth daily.     prochlorperazine (COMPAZINE) 10 MG tablet Take 1 tablet (10 mg total) by mouth every 6 (six) hours as needed (Nausea or vomiting). 30 tablet 1   zolpidem (AMBIEN) 10 MG tablet Take 10 mg by mouth at bedtime.      No current facility-administered medications for this visit.     REVIEW OF SYSTEMS:   A 10+ POINT REVIEW OF SYSTEMS WAS OBTAINED including neurology, dermatology, psychiatry, cardiac, respiratory, lymph, extremities, GI, GU, Musculoskeletal, constitutional, breasts,  reproductive, HEENT.  All pertinent positives are noted in the HPI.   All others are negative.     PHYSICAL EXAMINATION: ECOG FS:1 - Symptomatic but completely ambulatory  There were no vitals filed for this visit. Wt Readings from Last 3 Encounters:  04/25/19 177 lb 11.2 oz (80.6 kg)  04/04/19 175 lb 6.4 oz (79.6 kg)  12/14/18 181 lb 6.4 oz (82.3 kg)   There is no height or weight on file to calculate BMI.    GENERAL:alert, in no acute distress and comfortable SKIN: no acute rashes, no significant lesions EYES: conjunctiva are pink and non-injected, sclera anicteric OROPHARYNX: MMM, no exudates, no oropharyngeal erythema or ulceration NECK: supple, no JVD LYMPH:  no palpable lymphadenopathy in the cervical, axillary or inguinal regions LUNGS: clear to auscultation b/l with normal respiratory effort HEART: regular rate & rhythm ABDOMEN:  normoactive bowel sounds , non tender, not distended. Extremity: no pedal edema PSYCH: alert & oriented x 3 with fluent speech NEURO: no focal motor/sensory deficits    LABORATORY DATA:  I have reviewed the data as listed  . CBC Latest Ref Rng & Units 05/02/2019 04/04/2019 12/14/2018  WBC 4.0 - 10.5 K/uL 28.7(H) 24.1(H) 18.4(H)  Hemoglobin 13.0 - 17.0 g/dL 10.2(L) 10.4(L) 10.2(L)  Hematocrit 39.0 - 52.0 % 30.0(L) 31.3(L) 31.5(L)  Platelets 150 - 400 K/uL 187 192 204    CBC    Component Value Date/Time   WBC 28.7 (H) 05/02/2019 1013   RBC 3.56 (L) 05/02/2019 1013   HGB 10.2 (L) 05/02/2019 1013   HGB 11.9 (L) 11/10/2017 1008   HCT 30.0 (L) 05/02/2019 1013   PLT 187 05/02/2019 1013   PLT 228 11/10/2017 1008   MCV 84.3 05/02/2019 1013   MCH 28.7 05/02/2019 1013   MCHC 34.0 05/02/2019 1013   RDW 14.6 05/02/2019 1013   LYMPHSABS 27.0 (H) 05/02/2019 1013   MONOABS 0.3 05/02/2019 1013   EOSABS 0.0 05/02/2019 1013   BASOSABS 0.0 05/02/2019 1013    CMP Latest Ref Rng & Units 05/02/2019 04/04/2019 12/14/2018  Glucose 70 - 99 mg/dL 110(H) 126(H) 120(H)  BUN 8 - 23 mg/dL 11 15 13   Creatinine 0.61 - 1.24 mg/dL  1.20 1.35(H) 1.31(H)  Sodium 135 - 145 mmol/L 137 140 140  Potassium 3.5 - 5.1 mmol/L 4.1 4.2 4.0  Chloride 98 - 111 mmol/L 108 108 108  CO2 22 - 32 mmol/L 22 22 23   Calcium 8.9 - 10.3 mg/dL 8.9 9.0 8.7(L)  Total Protein 6.5 - 8.1 g/dL 6.9 7.2 7.0  Total Bilirubin 0.3 - 1.2 mg/dL 0.7 0.7 0.6  Alkaline Phos 38 - 126 U/L 89 96 80  AST 15 - 41 U/L 14(L) 13(L) 13(L)  ALT 0 - 44 U/L 8 10 11    . Lab Results  Component Value Date   IRON 69 04/04/2019   TIBC 311 04/04/2019   IRONPCTSAT 22 04/04/2019   (Iron and TIBC)  Lab Results  Component Value Date   FERRITIN 66 04/04/2019   . Lab Results  Component Value Date   IRON 69 04/04/2019   TIBC 311 04/04/2019   IRONPCTSAT 22 04/04/2019   (Iron and TIBC)  Lab Results  Component Value Date   FERRITIN 66 04/04/2019    Component     Latest Ref Rng & Units 11/10/2017  Retic Ct Pct     0.8 - 1.8 % 1.3  RBC.     4.20 - 5.82 MIL/uL 4.75  Retic Count, Absolute  34.8 - 93.9 K/uL 61.8  LDH     125 - 245 U/L 152  HCV Ab     0.0 - 0.9 s/co ratio <0.1  HIV Screen 4th Generation wRfx     Non Reactive Non Reactive  Hepatitis B Surface Ag     Negative Negative  Hep B Core Ab, Tot     Negative Negative    10/31/17 Tissue Flow Cytometry:   10/31/17 Lymph Node Needle/Core Biopsy:  11/10/17 Peripheral Blood Flow Cytometry:   11/10/17 FISH CLL Prognostic Panel:      RADIOGRAPHIC STUDIES: I have personally reviewed the radiological images as listed and agreed with the findings in the report.  08/28/17 MRI     Nm Pet Image Restag (ps) Skull Base To Thigh  Result Date: 05/15/2019 CLINICAL DATA:  Subsequent treatment strategy for small B-cell lymphoma. EXAM: NUCLEAR MEDICINE PET SKULL BASE TO THIGH TECHNIQUE: 9.0 mCi F-18 FDG was injected intravenously. Full-ring PET imaging was performed from the skull base to thigh after the radiotracer. CT data was obtained and used for attenuation correction and anatomic localization.  Fasting blood glucose: 104 mg/dl COMPARISON:  Multiple exams, including CT scan 12/11/2018 and prior PET-CT from 11/22/2017 FINDINGS: Mediastinal blood pool activity: SUV max 2.2 Liver activity: SUV max 3.9 NECK: Again observed is widespread bilateral adenopathy in the neck with a similar distribution to the prior exam, and with low-grade activity. There is also misregistration of the PET and CT data which throws off the attenuation correction and may adversely affect measured values. A lymph node interposed between the left sternocleidomastoid and upper pectoral muscle measures 1.7 cm in short axis on image 40/4 (formerly 1.5 cm) with maximum SUV 2.4 (formerly 2.0), Deauville 3. Most of the other adenopathy is Deauville 3. Incidental CT findings: none CHEST: Extensive bilateral axillary adenopathy although improved from prior. The conglomerate adenopathy previously measured in the left axilla measures 2.2 by 3.0 cm on image 57/4, previously 4.9 by 3.3 cm, and has a maximum standard uptake value of 2.1 (Deauville 2), previously 3.2. On the other hand, there is some worsening paratracheal adenopathy, with a right upper paratracheal node measuring 1.0 cm in short axis on image 55/4 (formerly 0.7 cm) with maximum SUV 4.3 (formerly 2.5), Deauville 4. A new left upper lobe peribronchovascular nodule measuring about 1.7 cm in diameter with a maximum SUV of 12.2 (Deauville 5). The lower thoracic periaortic adenopathy is again observed with low-level activity. Incidental CT findings: Old granulomatous disease. ABDOMEN/PELVIS: Considerable mesenteric and retroperitoneal adenopathy is again identified. Some of this is rather bulky. A 4.0 cm in short axis right external iliac node on image 136/4 previously measured 2.4 cm in diameter. Current maximum SUV 3.9 (Deauville 3), previously 3.1. The lymph nodes appear slightly larger on today's exam but also have mildly reduced marginal definition at least partially due to motion  artifact. Most of the other lymph nodes are in the Deauville 2 to Deauville 3 category. A right external iliac node measures 1.6 cm in short axis on image 161/4, previously 1.7 cm, with maximum SUV 2.2 (formerly 2.8), Deauville 2. Similar left external iliac adenopathy. Incidental CT findings: Scattered calcified mesenteric lymph nodes. SKELETON: No significant abnormal hypermetabolic activity in this region. Incidental CT findings: none IMPRESSION: 1. New focal nodular hypermetabolic left upper lobe pulmonary nodule, maximum SUV 12.2 compatible with Deauville 5 activity. There is also some worsening in Deauville 4 right upper paratracheal adenopathy. 2. In the neck, there continue to be widespread enlarged  lymph nodes similar to the prior exam, currently Deauville 3. 3. In the axillary regions, adenopathy is mildly reduced both in size and activity, currently Deauville 2. 4. In the abdomen, bulky adenopathy appears mildly increased in size in some locations, but still in the Deauville 2 to Deauville 3 range. Electronically Signed   By: Van Clines M.D.   On: 05/15/2019 15:01    ASSESSMENT & PLAN:   63 y.o. male with  1. Small B-Cell Lymphoma /Chronic lymphocytic Leukemia.  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 Newly noted Microcytic anemia with drop in hgb from 11.9 to 9.1 with severe iron deficiency. Not primarily related to lymphoma.  12/11/18 CT C/A/P which revealed "Interval progressive disease. Significant increase in adenopathy within the abdomen/pelvis. More mild enlargement of lymph nodes within the chest. 2. No splenomegaly. 3. Aortic Atherosclerosis."  2. Iron deficiency Anemia - unclear etiology -- will need GI workup - he notes that his GI workup was delayed due to COVID related scheduling issues . Today he was again asked to f/u with his GI doctor to have these scheduled.    PLAN: -Discussed pt labwork today, 05/16/19; all values are WNL except for WBC at  1.5, RBC at 2.92, Hemoglobin at 8.6, HCT at 25.3, Neutro Abs at 1.0, Lymphs Abs at 0.4, Monocytes absolute at 0.0, sodium at 128, Chloride at 97, Glucose at 101, Calcium at 8.2, Total protein at 6.4, and AST at 9. -Discussed 05/15/2019 PET scan revealing "1. New focal nodular hypermetabolic left upper lobe pulmonary nodule, maximum SUV 12.2 compatible with Deauville 5 activity. There is also some worsening in Deauville 4 right upper paratracheal Adenopathy. 2. In the neck, there continue to be widespread enlarged lymph nodes similar to the prior exam, currently Deauville 3. 3. In the axillary regions, adenopathy is mildly reduced both in size and activity, currently Deauville 2.4. In the abdomen, bulky adenopathy appears mildly increased in size in some locations, but still in the Deauville 2 to Deauville 3 range." -Discussed the lymphs on his neck have shrunk causes the dysuria and that pt has mild anemia which could contribute to the chills. -Discussed that if uric acid levels and chemistries are normal he can decrease water intake. -Recommended to stop fluid intake by 6 pm and to drink only room temperature water. Also recommended eating foods that promotes warmth. -Advised that fasting is not required for blood tests  -Discussed that pt should contact office if he has a fever. -Urine test to be completed today. -- UA , UCx no evidence of UTI -Discussed stopping certain medications recommended pt follow up with PCP for other medications prescribed by them.  -Recommended to stay active, drink water, and maintain healthy eating habits.   FOLLOW UP: Labs today for urine testing Plz schedule C2 of Bendamustine/Rituxan chemotherapy as per orders on 10/8 and 10/9, labs and MD visit on C2D1  The total time spent in the appt was 25 minutes and more than 50% was on counseling and direct patient cares.  All of the patient's questions were answered with apparent satisfaction. The patient knows to call the  clinic with any problems, questions or concerns.    Sullivan Lone MD MS AAHIVMS [facility] Hematology/Oncology Physician [facility]  (Office):       [phone] (Work cell):  [phone] (Fax):           [phone]  05/16/2019 7:56 AM  I, Jacqualyn Posey, am acting as a Education administrator for Dr.  Sullivan Lone.   .I have reviewed the above documentation for accuracy and completeness, and I agree with the above. Brunetta Genera MD

## 2019-05-17 LAB — URINE CULTURE: Culture: <10000 — AB

## 2019-05-30 ENCOUNTER — Inpatient Hospital Stay: Payer: Medicare Other | Attending: Hematology

## 2019-05-30 ENCOUNTER — Encounter: Payer: Self-pay | Admitting: Hematology

## 2019-05-30 ENCOUNTER — Inpatient Hospital Stay (HOSPITAL_BASED_OUTPATIENT_CLINIC_OR_DEPARTMENT_OTHER): Payer: Medicare Other | Admitting: Hematology

## 2019-05-30 ENCOUNTER — Other Ambulatory Visit: Payer: Self-pay

## 2019-05-30 ENCOUNTER — Inpatient Hospital Stay: Payer: Medicare Other

## 2019-05-30 ENCOUNTER — Telehealth: Payer: Self-pay | Admitting: Hematology

## 2019-05-30 VITALS — BP 109/65 | HR 58 | Temp 98.5°F | Resp 18

## 2019-05-30 VITALS — BP 123/63 | HR 70 | Temp 98.5°F | Resp 18 | Ht 67.0 in | Wt 177.5 lb

## 2019-05-30 DIAGNOSIS — Z79899 Other long term (current) drug therapy: Secondary | ICD-10-CM | POA: Insufficient documentation

## 2019-05-30 DIAGNOSIS — D509 Iron deficiency anemia, unspecified: Secondary | ICD-10-CM | POA: Diagnosis not present

## 2019-05-30 DIAGNOSIS — Z7189 Other specified counseling: Secondary | ICD-10-CM

## 2019-05-30 DIAGNOSIS — Z5111 Encounter for antineoplastic chemotherapy: Secondary | ICD-10-CM | POA: Diagnosis not present

## 2019-05-30 DIAGNOSIS — I700 Atherosclerosis of aorta: Secondary | ICD-10-CM | POA: Diagnosis not present

## 2019-05-30 DIAGNOSIS — C8308 Small cell B-cell lymphoma, lymph nodes of multiple sites: Secondary | ICD-10-CM | POA: Insufficient documentation

## 2019-05-30 DIAGNOSIS — D649 Anemia, unspecified: Secondary | ICD-10-CM

## 2019-05-30 DIAGNOSIS — C9110 Chronic lymphocytic leukemia of B-cell type not having achieved remission: Secondary | ICD-10-CM

## 2019-05-30 LAB — CMP (CANCER CENTER ONLY)
ALT: 10 U/L (ref 0–44)
AST: 9 U/L — ABNORMAL LOW (ref 15–41)
Albumin: 3.9 g/dL (ref 3.5–5.0)
Alkaline Phosphatase: 61 U/L (ref 38–126)
Anion gap: 9 (ref 5–15)
BUN: 10 mg/dL (ref 8–23)
CO2: 22 mmol/L (ref 22–32)
Calcium: 8.9 mg/dL (ref 8.9–10.3)
Chloride: 101 mmol/L (ref 98–111)
Creatinine: 1 mg/dL (ref 0.61–1.24)
GFR, Est AFR Am: >60 mL/min (ref >60)
GFR, Estimated: >60 mL/min (ref >60)
Glucose, Bld: 106 mg/dL — ABNORMAL HIGH (ref 70–99)
Potassium: 3.9 mmol/L (ref 3.5–5.1)
Sodium: 132 mmol/L — ABNORMAL LOW (ref 135–145)
Total Bilirubin: 0.7 mg/dL (ref 0.3–1.2)
Total Protein: 6.4 g/dL — ABNORMAL LOW (ref 6.5–8.1)

## 2019-05-30 LAB — MAGNESIUM: Magnesium: 1.6 mg/dL — ABNORMAL LOW (ref 1.7–2.4)

## 2019-05-30 LAB — CBC WITH DIFFERENTIAL/PLATELET
Abs Immature Granulocytes: 0.02 10*3/uL (ref 0.00–0.07)
Basophils Absolute: 0 10*3/uL (ref 0.0–0.1)
Basophils Relative: 0 %
Eosinophils Absolute: 0 10*3/uL (ref 0.0–0.5)
Eosinophils Relative: 0 %
HCT: 25.9 % — ABNORMAL LOW (ref 39.0–52.0)
Hemoglobin: 9 g/dL — ABNORMAL LOW (ref 13.0–17.0)
Immature Granulocytes: 1 %
Lymphocytes Relative: 9 %
Lymphs Abs: 0.3 10*3/uL — ABNORMAL LOW (ref 0.7–4.0)
MCH: 30.1 pg (ref 26.0–34.0)
MCHC: 34.7 g/dL (ref 30.0–36.0)
MCV: 86.6 fL (ref 80.0–100.0)
Monocytes Absolute: 0 10*3/uL — ABNORMAL LOW (ref 0.1–1.0)
Monocytes Relative: 1 %
Neutro Abs: 3.3 10*3/uL (ref 1.7–7.7)
Neutrophils Relative %: 89 %
Platelets: 235 10*3/uL (ref 150–400)
RBC: 2.99 MIL/uL — ABNORMAL LOW (ref 4.22–5.81)
RDW: 15.1 % (ref 11.5–15.5)
WBC: 3.6 10*3/uL — ABNORMAL LOW (ref 4.0–10.5)
nRBC: 0 % (ref 0.0–0.2)

## 2019-05-30 LAB — LACTATE DEHYDROGENASE: LDH: 107 U/L (ref 98–192)

## 2019-05-30 MED ORDER — DEXAMETHASONE SODIUM PHOSPHATE 10 MG/ML IJ SOLN
INTRAMUSCULAR | Status: AC
  Filled 2019-05-30: qty 1

## 2019-05-30 MED ORDER — ACETAMINOPHEN 325 MG PO TABS
650.0000 mg | ORAL_TABLET | Freq: Once | ORAL | Status: AC
  Administered 2019-05-30: 15:00:00 650 mg via ORAL

## 2019-05-30 MED ORDER — DIPHENHYDRAMINE HCL 25 MG PO CAPS
50.0000 mg | ORAL_CAPSULE | Freq: Once | ORAL | Status: AC
  Administered 2019-05-30: 50 mg via ORAL

## 2019-05-30 MED ORDER — SODIUM CHLORIDE 0.9 % IV SOLN
375.0000 mg/m2 | Freq: Once | INTRAVENOUS | Status: AC
  Administered 2019-05-30: 700 mg via INTRAVENOUS
  Filled 2019-05-30: qty 50

## 2019-05-30 MED ORDER — PALONOSETRON HCL INJECTION 0.25 MG/5ML
0.2500 mg | Freq: Once | INTRAVENOUS | Status: AC
  Administered 2019-05-30: 0.25 mg via INTRAVENOUS

## 2019-05-30 MED ORDER — SODIUM CHLORIDE 0.9 % IV SOLN
90.0000 mg/m2 | Freq: Once | INTRAVENOUS | Status: AC
  Administered 2019-05-30: 175 mg via INTRAVENOUS
  Filled 2019-05-30: qty 7

## 2019-05-30 MED ORDER — SODIUM CHLORIDE 0.9 % IV SOLN
Freq: Once | INTRAVENOUS | Status: AC
  Administered 2019-05-30: 15:00:00 via INTRAVENOUS
  Filled 2019-05-30: qty 250

## 2019-05-30 MED ORDER — DEXAMETHASONE SODIUM PHOSPHATE 10 MG/ML IJ SOLN
10.0000 mg | Freq: Once | INTRAMUSCULAR | Status: AC
  Administered 2019-05-30: 15:00:00 10 mg via INTRAVENOUS

## 2019-05-30 MED ORDER — DIPHENHYDRAMINE HCL 25 MG PO CAPS
ORAL_CAPSULE | ORAL | Status: AC
  Filled 2019-05-30: qty 2

## 2019-05-30 MED ORDER — PALONOSETRON HCL INJECTION 0.25 MG/5ML
INTRAVENOUS | Status: AC
  Filled 2019-05-30: qty 5

## 2019-05-30 MED ORDER — ACETAMINOPHEN 325 MG PO TABS
ORAL_TABLET | ORAL | Status: AC
  Filled 2019-05-30: qty 2

## 2019-05-30 NOTE — Telephone Encounter (Signed)
Scheduled appt per 10/8 los.  Sent a secure chat to get a calendar taken to the patient.

## 2019-05-30 NOTE — Patient Instructions (Signed)
[facility] Discharge Instructions for Patients Receiving Chemotherapy  Today you received the following chemotherapy agents Rituximab-pvvr (RUXIENCE) & Bendamustine (BENDEKA).  To help prevent nausea and vomiting after your treatment, we encourage you to take your nausea medication as prescribed. Take Compazine first 3 days if you feel nausea.    If you develop nausea and vomiting that is not controlled by your nausea medication, call the clinic.   BELOW ARE SYMPTOMS THAT SHOULD BE REPORTED IMMEDIATELY:  *FEVER GREATER THAN 100.5 F  *CHILLS WITH OR WITHOUT FEVER  NAUSEA AND VOMITING THAT IS NOT CONTROLLED WITH YOUR NAUSEA MEDICATION  *UNUSUAL SHORTNESS OF BREATH  *UNUSUAL BRUISING OR BLEEDING  TENDERNESS IN MOUTH AND THROAT WITH OR WITHOUT PRESENCE OF ULCERS  *URINARY PROBLEMS  *BOWEL PROBLEMS  UNUSUAL RASH Items with * indicate a potential emergency and should be followed up as soon as possible.  Feel free to call the clinic should you have any questions or concerns. The clinic phone number is [phone].  Please show the [place] at check-in to the Emergency Department and triage nurse.  Rituximab injection What is this medicine? RITUXIMAB (ri TUX i mab) is a monoclonal antibody. It is used to treat certain types of cancer like non-Hodgkin lymphoma and chronic lymphocytic leukemia. It is also used to treat rheumatoid arthritis, granulomatosis with polyangiitis (or Wegener's granulomatosis), microscopic polyangiitis, and pemphigus vulgaris. This medicine may be used for other purposes; ask your health care provider or pharmacist if you have questions. COMMON BRAND NAME(S): Rituxan, RUXIENCE What should I tell my health care provider before I take this medicine? They need to know if you have any of these conditions:  heart disease  infection (especially a virus infection such as hepatitis B, chickenpox, cold sores, or herpes)  immune system  problems  irregular heartbeat  kidney disease  low blood counts, like low white cell, platelet, or red cell counts  lung or breathing disease, like asthma  recently received or scheduled to receive a vaccine  an unusual or allergic reaction to rituximab, other medicines, foods, dyes, or preservatives  pregnant or trying to get pregnant  breast-feeding How should I use this medicine? This medicine is for infusion into a vein. It is administered in a hospital or clinic by a specially trained health care professional. A special MedGuide will be given to you by the pharmacist with each prescription and refill. Be sure to read this information carefully each time. Talk to your pediatrician regarding the use of this medicine in children. This medicine is not approved for use in children. Overdosage: If you think you have taken too much of this medicine contact a poison control center or emergency room at once. NOTE: This medicine is only for you. Do not share this medicine with others. What if I miss a dose? It is important not to miss a dose. Call your doctor or health care professional if you are unable to keep an appointment. What may interact with this medicine?  cisplatin  live virus vaccines This list may not describe all possible interactions. Give your health care provider a list of all the medicines, herbs, non-prescription drugs, or dietary supplements you use. Also tell them if you smoke, drink alcohol, or use illegal drugs. Some items may interact with your medicine. What should I watch for while using this medicine? Your condition will be monitored carefully while you are receiving this medicine. You may need blood work done while you are taking this  medicine. This medicine can cause serious allergic reactions. To reduce your risk you may need to take medicine before treatment with this medicine. Take your medicine as directed. In some patients, this medicine may cause a  serious brain infection that may cause death. If you have any problems seeing, thinking, speaking, walking, or standing, tell your healthcare professional right away. If you cannot reach your healthcare professional, urgently seek other source of medical care. Call your doctor or health care professional for advice if you get a fever, chills or sore throat, or other symptoms of a cold or flu. Do not treat yourself. This drug decreases your body's ability to fight infections. Try to avoid being around people who are sick. Do not become pregnant while taking this medicine or for at least 12 months after stopping it. Women should inform their doctor if they wish to become pregnant or think they might be pregnant. There is a potential for serious side effects to an unborn child. Talk to your health care professional or pharmacist for more information. Do not breast-feed an infant while taking this medicine or for at least 6 months after stopping it. What side effects may I notice from receiving this medicine? Side effects that you should report to your doctor or health care professional as soon as possible:  allergic reactions like skin rash, itching or hives; swelling of the face, lips, or tongue  breathing problems  chest pain  changes in vision  diarrhea  headache with fever, neck stiffness, sensitivity to light, nausea, or confusion  fast, irregular heartbeat  loss of memory  low blood counts - this medicine may decrease the number of white blood cells, red blood cells and platelets. You may be at increased risk for infections and bleeding.  mouth sores  problems with balance, talking, or walking  redness, blistering, peeling or loosening of the skin, including inside the mouth  signs of infection - fever or chills, cough, sore throat, pain or difficulty passing urine  signs and symptoms of kidney injury like trouble passing urine or change in the amount of urine  signs and symptoms  of liver injury like dark yellow or brown urine; general ill feeling or flu-like symptoms; light-colored stools; loss of appetite; nausea; right upper belly pain; unusually weak or tired; yellowing of the eyes or skin  signs and symptoms of low blood pressure like dizziness; feeling faint or lightheaded, falls; unusually weak or tired  stomach pain  swelling of the ankles, feet, hands  unusual bleeding or bruising  vomiting Side effects that usually do not require medical attention (report to your doctor or health care professional if they continue or are bothersome):  headache  joint pain  muscle cramps or muscle pain  nausea  tiredness This list may not describe all possible side effects. Call your doctor for medical advice about side effects. You may report side effects to FDA at 1-800-FDA-1088. Where should I keep my medicine? This drug is given in a hospital or clinic and will not be stored at home. NOTE: This sheet is a summary. It may not cover all possible information. If you have questions about this medicine, talk to your doctor, pharmacist, or health care provider.  2020 Elsevier/Gold Standard (2018-09-19 22:01:36)  Bendamustine Injection What is this medicine? BENDAMUSTINE (BEN da MUS teen) is a chemotherapy drug. It is used to treat chronic lymphocytic leukemia and non-Hodgkin lymphoma. This medicine may be used for other purposes; ask your health care provider or pharmacist  if you have questions. COMMON BRAND NAME(S): Kristine Royal, Treanda What should I tell my health care provider before I take this medicine? They need to know if you have any of these conditions:  infection (especially a virus infection such as chickenpox, cold sores, or herpes)  kidney disease  liver disease  an unusual or allergic reaction to bendamustine, mannitol, other medicines, foods, dyes, or preservatives  pregnant or trying to get pregnant  breast-feeding How should I use  this medicine? This medicine is for infusion into a vein. It is given by a health care professional in a hospital or clinic setting. Talk to your pediatrician regarding the use of this medicine in children. Special care may be needed. Overdosage: If you think you have taken too much of this medicine contact a poison control center or emergency room at once. NOTE: This medicine is only for you. Do not share this medicine with others. What if I miss a dose? It is important not to miss your dose. Call your doctor or health care professional if you are unable to keep an appointment. What may interact with this medicine? Do not take this medicine with any of the following medications:  clozapine This medicine may also interact with the following medications:  atazanavir  cimetidine  ciprofloxacin  enoxacin  fluvoxamine  medicines for seizures like carbamazepine and phenobarbital  mexiletine  rifampin  tacrine  thiabendazole  zileuton This list may not describe all possible interactions. Give your health care provider a list of all the medicines, herbs, non-prescription drugs, or dietary supplements you use. Also tell them if you smoke, drink alcohol, or use illegal drugs. Some items may interact with your medicine. What should I watch for while using this medicine? This drug may make you feel generally unwell. This is not uncommon, as chemotherapy can affect healthy cells as well as cancer cells. Report any side effects. Continue your course of treatment even though you feel ill unless your doctor tells you to stop. You may need blood work done while you are taking this medicine. Call your doctor or healthcare provider for advice if you get a fever, chills or sore throat, or other symptoms of a cold or flu. Do not treat yourself. This drug decreases your body's ability to fight infections. Try to avoid being around people who are sick. This medicine may cause serious skin reactions.  They can happen weeks to months after starting the medicine. Contact your healthcare provider right away if you notice fevers or flu-like symptoms with a rash. The rash may be red or purple and then turn into blisters or peeling of the skin. Or, you might notice a red rash with swelling of the face, lips or lymph nodes in your neck or under your arms. This medicine may increase your risk to bruise or bleed. Call your doctor or healthcare provider if you notice any unusual bleeding. Talk to your doctor about your risk of cancer. You may be more at risk for certain types of cancers if you take this medicine. Do not become pregnant while taking this medicine or for at least 6 months after stopping it. Women should inform their doctor if they wish to become pregnant or think they might be pregnant. Men should not father a child while taking this medicine and for at least 3 months after stopping it. There is a potential for serious side effects to an unborn child. Talk to your healthcare provider or pharmacist for more information. Do not  breast-feed an infant while taking this medicine or for at least 1 week after stopping it. This medicine may make it more difficult to father a child. You should talk with your doctor or healthcare provider if you are concerned about your fertility. What side effects may I notice from receiving this medicine? Side effects that you should report to your doctor or health care professional as soon as possible:  allergic reactions like skin rash, itching or hives, swelling of the face, lips, or tongue  low blood counts - this medicine may decrease the number of white blood cells, red blood cells and platelets. You may be at increased risk for infections and bleeding.  rash, fever, and swollen lymph nodes  redness, blistering, peeling, or loosening of the skin, including inside the mouth  signs of infection like fever or chills, cough, sore throat, pain or difficulty passing  urine  signs of decreased platelets or bleeding like bruising, pinpoint red spots on the skin, black, tarry stools, blood in the urine  signs of decreased red blood cells like being unusually weak or tired, fainting spells, lightheadedness  signs and symptoms of kidney injury like trouble passing urine or change in the amount of urine  signs and symptoms of liver injury like dark yellow or brown urine; general ill feeling or flu-like symptoms; light-colored stools; loss of appetite; nausea; right upper belly pain; unusually weak or tired; yellowing of the eyes or skin Side effects that usually do not require medical attention (report to your doctor or health care professional if they continue or are bothersome):  constipation  decreased appetite  diarrhea  headache  mouth sores  nausea, vomiting  tiredness This list may not describe all possible side effects. Call your doctor for medical advice about side effects. You may report side effects to FDA at 1-800-FDA-1088. Where should I keep my medicine? This drug is given in a hospital or clinic and will not be stored at home. NOTE: This sheet is a summary. It may not cover all possible information. If you have questions about this medicine, talk to your doctor, pharmacist, or health care provider.  2020 Elsevier/Gold Standard (2018-10-30 10:26:46)  Coronavirus (COVID-19) Are you at risk?  Are you at risk for the Coronavirus (COVID-19)?  To be considered HIGH RISK for Coronavirus (COVID-19), you have to meet the following criteria:  . Traveled to [place], [place], [place], [place] or [place]; or in the [place] to [place], [place], [place], or [place]; and have fever, cough, and shortness of breath within the last 2 weeks of travel OR . Been in close contact with a person diagnosed with COVID-19 within the last 2 weeks and have fever, cough, and shortness of breath . IF YOU DO NOT MEET THESE CRITERIA, YOU ARE CONSIDERED  LOW RISK FOR COVID-19.  What to do if you are HIGH RISK for COVID-19?  [name] If you are having a medical emergency, call 911. . Seek medical care right away. Before you go to a doctor's office, urgent care or emergency department, call ahead and tell them about your recent travel, contact with someone diagnosed with COVID-19, and your symptoms. You should receive instructions from your physician's office regarding next steps of care.  . When you arrive at healthcare provider, tell the healthcare staff immediately you have returned from visiting [place], [place], [place], [place] or [place]; or traveled in the [place] to [place], [place], [place], or [place]; in the last two  weeks or you have been in close contact with a person diagnosed with COVID-19 in the last 2 weeks.   . Tell the health care staff about your symptoms: fever, cough and shortness of breath. . After you have been seen by a medical provider, you will be either: o Tested for (COVID-19) and discharged home on quarantine except to seek medical care if symptoms worsen, and asked to  - Stay home and avoid contact with others until you get your results (4-5 days)  - Avoid travel on public transportation if possible (such as bus, train, or airplane) or o Sent to the Emergency Department by EMS for evaluation, COVID-19 testing, and possible admission depending on your condition and test results.  What to do if you are LOW RISK for COVID-19?  Reduce your risk of any infection by using the same precautions used for avoiding the common cold or flu:  [name] Wash your hands often with soap and warm water for at least 20 seconds.  If soap and water are not readily available, use an alcohol-based hand sanitizer with at least 60% alcohol.  . If coughing or sneezing, cover your mouth and nose by coughing or sneezing into the elbow areas of your shirt or coat, into a tissue or into your sleeve (not your hands). . Avoid shaking hands with others  and consider head nods or verbal greetings only. . Avoid touching your eyes, nose, or mouth with unwashed hands.  . Avoid close contact with people who are sick. . Avoid places or events with large numbers of people in one location, like concerts or sporting events. . Carefully consider travel plans you have or are making. . If you are planning any travel outside or inside the [place], visit the CDC's Travelers' Health webpage for the latest health notices. . If you have some symptoms but not all symptoms, continue to monitor at home and seek medical attention if your symptoms worsen. . If you are having a medical emergency, call 911.   [place] / e-Visit: [email or web address]         [facility]: [facility]: [id number]                   [facility]: [phone]

## 2019-05-30 NOTE — Progress Notes (Signed)
HEMATOLOGY/ONCOLOGY CLINIC NOTE  Date of Service:  05/30/19   Patient Care Team: Bernerd Limbo, MD as PCP - General (Family Medicine) Brunetta Genera, MD as Consulting Physician (Hematology)   CHIEF COMPLAINTS/PURPOSE OF CONSULTATION:  Small B-Cell Lymphoma   HISTORY OF PRESENTING ILLNESS:   [name] [name] is a wonderful 63 y.o. 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 The pt reports that he is doing well overall.   The pt reports that in August 2018 he felt a knot on the left side of his neck, which he spoke about with his PCP Dr. Coletta Memos. He then had an MRI in January 2019, a CXR in February 2019 and then a biopsy on 10/26/17 (results noted below). He denies any constitutional symptoms as noted below. He notes that the swelling on his neck has not increased in size and is not painful.   He notes that he gets congested at night and is only able to breathe out of one nostril; he denies having a deviated septum. He notes that he has tried to have this worked up for the last 4 years without success and has tried saline sprays without success. He notes that he wakes up with dry mouth.   The pt takes Amlodipine, 81mg  Aspirin, Atorvastatin, Cyclobenzaprine, Ferrousul, Losartan Potassium, Meloxicam, Metformin 500mg , Percocet, Protonix, Potassium Chloride, and Ambein.   Of note prior to the patient's visit today, pt has had Lymph node, needle/core biopsy, Left Supraclavicular completed on 10/31/17 with results revealing SMALL LYMPHOCYTIC LYMPHOMA.  10/26/17 Tissue Flow Cytometry revealed a Monoclonal B Cell population identified.    08/28/17 MRI revealed Extensive cervical adenopathy. Adenopathy also noted in the subpectoral nodes.   CXR on 10/11/17 was revealed to be Normal.   His 10/11/17 CBC revealed all values WNL except for % Lymph at 66.3%, Mono % at 3.3% and % Gran at 30.4%, Lymphs Abs at 6.4k, Hgb at 12.5, HCT at 37.5,  MCV at 75.5, MCH at 25.1, RDW at 16.3, MPV at 6.4  On review of systems, pt reports left cervical lymph node swelling and denies fevers, chills, night sweats, unexpected weight loss, CP, SOB, abdominal pain or swelling, pain along the spine, changes in bowel habits, skin rashes, leg swelling, and any other symptoms.    On PMHx the pt reports Hyperlipidemia, Hypertensive reitonpathy, Hypopotassemia, DM Type 2, acid reflux, spinal stenosis.  On Social Hx the pt reports being a former smoker, having quit in 2012. He notes that he smoked about 5 cigars per week before quitting. He denies consuming much ETOH. He denies any recreational drug use. He denies any unsafe needle exposure.  On Family Hx the pt reports an uncle who had cancer but wasn't sure what kind.   INTERVAL HISTORY: [name] [name] returns today regarding his Small B Cell Lymphoma. The patient's last visit with [place] was on 05/16/2019. The pt reports that he is doing well overall.  The pt reports that he is doing well but still have nocturia and erectile dysfunction. He is still taking percocet for back and knee pain. LN in neck and axilla significantly decreased in size  Lab results today (05/30/19) of CBC w/diff and CMP is as follows: all values are WNL except for WBC at 3.6, RBC at 2.99, Hemoglobin at 9.0, HCT at 25.9, lymphs Abs at 0.3, Monocytes Absolute at 0.0, Sodium at 132, Glucose at 106, Total Protein at 6.4, AST at  9, and magnesium at 1.6.  On review of systems, pt reports nocturia and erectile dysfunction, increase in appetite and denies fevers, chills, night sweats, abdominal pain, swelling in  Groin lymphs, feeling fatigued and any other symptoms.   MEDICAL HISTORY:  Past Medical History:  Diagnosis Date   Allergic rhinitis, cause unspecified    Backache, unspecified    Body mass index 33.0-33.9, adult    Diabetes mellitus without complication (HCC)    Type II   Elevated blood pressure reading without diagnosis of  hypertension    Esophageal reflux    Generalized pain    Heartburn    Insomnia, unspecified    Nonspecific reaction to tuberculin skin test without active tuberculosis(795.51)    Other abnormal blood chemistry    Other and unspecified hyperlipidemia    Other dyspnea and respiratory abnormality    Other nonspecific findings on examination of blood(790.99)    Pain in joint, lower leg    Pneumonia    1968   Sleep apnea    Does not use or own a CPAP   Tobacco use disorder    Unspecified essential hypertension     SURGICAL HISTORY: Past Surgical History:  Procedure Laterality Date   bil wrist surgery     ENDOSCOPIC CONCHA BULLOSA RESECTION Bilateral 05/18/2018   Procedure: ENDOSCOPIC CONCHA BULLOSA RESECTION;  Surgeon: Jerrell Belfast, MD;  Location: [place];  Service: ENT;  Laterality: Bilateral;   NASAL SEPTOPLASTY W/ TURBINOPLASTY Bilateral 05/18/2018   Procedure: NASAL SEPTOPLASTY WITH TURBINATE REDUCTION;  Surgeon: Jerrell Belfast, MD;  Location: [place];  Service: ENT;  Laterality: Bilateral;   ORIF FOREARM FRACTURE     right   plastic surgery to face     SINUS ENDO W/FUSION Bilateral 05/18/2018   Procedure: ENDOSCOPIC SINUS SURGERY WITH NAVIGATION;  Surgeon: Jerrell Belfast, MD;  Location: [facility] OR;  Service: ENT;  Laterality: Bilateral;    SOCIAL HISTORY: Social History   Socioeconomic History   Marital status: Legally Separated    Spouse name: Not on file   Number of children: 1   Years of education: Not on file   Highest education level: Not on file  Occupational History   Not on file  Social Needs   Financial resource strain: Not on file   Food insecurity    Worry: Not on file    Inability: Not on file   Transportation needs    Medical: Not on file    Non-medical: Not on file  Tobacco Use   Smoking status: Former Smoker    Packs/day: 0.20    Years: 20.00    Pack years: 4.00    Types: Cigars    Quit date: 2014    Years since  quitting: 6.7   Smokeless tobacco: Never Used  Substance and Sexual Activity   Alcohol use: Yes    Comment: 2 40oz beers a week   Drug use: No   Sexual activity: Not on file  Lifestyle   Physical activity    Days per week: Not on file    Minutes per session: Not on file   Stress: Not on file  Relationships   Social connections    Talks on phone: Not on file    Gets together: Not on file    Attends religious service: Not on file    Active member of club or organization: Not on file    Attends meetings of clubs or organizations: Not on file    Relationship status: Not  on file   Intimate partner violence    Fear of current or ex partner: Not on file    Emotionally abused: Not on file    Physically abused: Not on file    Forced sexual activity: Not on file  Other Topics Concern   Not on file  Social History Narrative   Not on file    FAMILY HISTORY: Family History  Problem Relation Age of Onset   Diabetes Mother    Hypertension Mother    Cancer Paternal Uncle     ALLERGIES:  is allergic to lisinopril and tuberculin tests.  MEDICATIONS:  Current Outpatient Medications  Medication Sig Dispense Refill   acyclovir (ZOVIRAX) 400 MG tablet Take 1 tablet (400 mg total) by mouth daily. 30 tablet 3   allopurinol (ZYLOPRIM) 300 MG tablet Take 0.5 tablets (150 mg total) by mouth daily. 30 tablet 3   amLODipine (NORVASC) 10 MG tablet Take 10 mg by mouth daily.     atorvastatin (LIPITOR) 20 MG tablet Take 20 mg by mouth daily.     cyclobenzaprine (FLEXERIL) 10 MG tablet Take 10 mg by mouth at bedtime.     dexamethasone (DECADRON) 4 MG tablet Take 2 tablets (8 mg total) by mouth daily. Start the day after bendamustine chemotherapy for 2 days. Take with food. 30 tablet 1   losartan (COZAAR) 50 MG tablet Take 50 mg by mouth daily.     meloxicam (MOBIC) 15 MG tablet Take 15 mg by mouth daily.     metFORMIN (GLUCOPHAGE) 500 MG tablet Take 500 mg by mouth daily.       ondansetron (ZOFRAN) 8 MG tablet Take 1 tablet (8 mg total) by mouth 2 (two) times daily as needed for refractory nausea / vomiting. Start on day 2 after bendamustine chemotherapy. 30 tablet 1   oxyCODONE-acetaminophen (PERCOCET/ROXICET) 5-325 MG tablet Take 1 tablet by mouth 3 (three) times daily as needed for pain.  0   pantoprazole (PROTONIX) 40 MG tablet Take 40 mg by mouth at bedtime.      potassium chloride SA (K-DUR,KLOR-CON) 20 MEQ tablet Take 20 mEq by mouth daily.     prochlorperazine (COMPAZINE) 10 MG tablet Take 1 tablet (10 mg total) by mouth every 6 (six) hours as needed (Nausea or vomiting). 30 tablet 1   zolpidem (AMBIEN) 10 MG tablet Take 10 mg by mouth at bedtime.      No current facility-administered medications for this visit.     REVIEW OF SYSTEMS:   A 10+ POINT REVIEW OF SYSTEMS WAS OBTAINED including neurology, dermatology, psychiatry, cardiac, respiratory, lymph, extremities, GI, GU, Musculoskeletal, constitutional, breasts, reproductive, HEENT.  All pertinent positives are noted in the HPI.  All others are negative.     PHYSICAL EXAMINATION: ECOG FS:1 - Symptomatic but completely ambulatory  There were no vitals filed for this visit. Wt Readings from Last 3 Encounters:  05/16/19 174 lb 4.8 oz (79.1 kg)  04/25/19 177 lb 11.2 oz (80.6 kg)  04/04/19 175 lb 6.4 oz (79.6 kg)   There is no height or weight on file to calculate BMI.    GENERAL:alert, in no acute distress and comfortable SKIN: no acute rashes, no significant lesions EYES: conjunctiva are pink and non-injected, sclera anicteric OROPHARYNX: MMM, no exudates, no oropharyngeal erythema or ulceration NECK: supple, no JVD LYMPH:  no palpable lymphadenopathy in the cervical, axillary or inguinal regions LUNGS: clear to auscultation b/l with normal respiratory effort HEART: regular rate & rhythm ABDOMEN:  normoactive bowel  sounds , non tender, not distended. Extremity: no pedal edema PSYCH: alert  & oriented x 3 with fluent speech NEURO: no focal motor/sensory deficits    LABORATORY DATA:  I have reviewed the data as listed  . CBC Latest Ref Rng & Units 05/16/2019 05/02/2019 04/04/2019  WBC 4.0 - 10.5 K/uL 1.5(L) 28.7(H) 24.1(H)  Hemoglobin 13.0 - 17.0 g/dL 8.6(L) 10.2(L) 10.4(L)  Hematocrit 39.0 - 52.0 % 25.3(L) 30.0(L) 31.3(L)  Platelets 150 - 400 K/uL 209 187 192    CBC    Component Value Date/Time   WBC 1.5 (L) 05/16/2019 1055   RBC 2.92 (L) 05/16/2019 1055   HGB 8.6 (L) 05/16/2019 1055   HGB 11.9 (L) 11/10/2017 1008   HCT 25.3 (L) 05/16/2019 1055   PLT 209 05/16/2019 1055   PLT 228 11/10/2017 1008   MCV 86.6 05/16/2019 1055   MCH 29.5 05/16/2019 1055   MCHC 34.0 05/16/2019 1055   RDW 13.9 05/16/2019 1055   LYMPHSABS 0.4 (L) 05/16/2019 1055   MONOABS 0.0 (L) 05/16/2019 1055   EOSABS 0.0 05/16/2019 1055   BASOSABS 0.0 05/16/2019 1055    CMP Latest Ref Rng & Units 05/16/2019 05/02/2019 04/04/2019  Glucose 70 - 99 mg/dL 101(H) 110(H) 126(H)  BUN 8 - 23 mg/dL 10 11 15   Creatinine 0.61 - 1.24 mg/dL 1.20 1.20 1.35(H)  Sodium 135 - 145 mmol/L 128(L) 137 140  Potassium 3.5 - 5.1 mmol/L 4.0 4.1 4.2  Chloride 98 - 111 mmol/L 97(L) 108 108  CO2 22 - 32 mmol/L 25 22 22   Calcium 8.9 - 10.3 mg/dL 8.2(L) 8.9 9.0  Total Protein 6.5 - 8.1 g/dL 6.4(L) 6.9 7.2  Total Bilirubin 0.3 - 1.2 mg/dL 1.2 0.7 0.7  Alkaline Phos 38 - 126 U/L 56 89 96  AST 15 - 41 U/L 9(L) 14(L) 13(L)  ALT 0 - 44 U/L 8 8 10    . Lab Results  Component Value Date   IRON 69 04/04/2019   TIBC 311 04/04/2019   IRONPCTSAT 22 04/04/2019   (Iron and TIBC)  Lab Results  Component Value Date   FERRITIN 66 04/04/2019   . Lab Results  Component Value Date   IRON 69 04/04/2019   TIBC 311 04/04/2019   IRONPCTSAT 22 04/04/2019   (Iron and TIBC)  Lab Results  Component Value Date   FERRITIN 66 04/04/2019    Component     Latest Ref Rng & Units 11/10/2017  Retic Ct Pct     0.8 - 1.8 % 1.3   RBC.     4.20 - 5.82 MIL/uL 4.75  Retic Count, Absolute     34.8 - 93.9 K/uL 61.8  LDH     125 - 245 U/L 152  HCV Ab     0.0 - 0.9 s/co ratio <0.1  HIV Screen 4th Generation wRfx     Non Reactive Non Reactive  Hepatitis B Surface Ag     Negative Negative  Hep B Core Ab, Tot     Negative Negative   05/15/2019: NUCLEAR MEDICINE PET SKULL BASE TO THIGH   10/31/17 Tissue Flow Cytometry:     10/31/17 Lymph Node Needle/Core Biopsy:  11/10/17 Peripheral Blood Flow Cytometry:   11/10/17 FISH CLL Prognostic Panel:      RADIOGRAPHIC STUDIES: I have personally reviewed the radiological images as listed and agreed with the findings in the report.  08/28/17 MRI     Nm Pet Image Restag (ps) Skull Base To Thigh  Result Date:  05/15/2019 CLINICAL DATA:  Subsequent treatment strategy for small B-cell lymphoma. EXAM: NUCLEAR MEDICINE PET SKULL BASE TO THIGH TECHNIQUE: 9.0 mCi F-18 FDG was injected intravenously. Full-ring PET imaging was performed from the skull base to thigh after the radiotracer. CT data was obtained and used for attenuation correction and anatomic localization. Fasting blood glucose: 104 mg/dl COMPARISON:  Multiple exams, including CT scan 12/11/2018 and prior PET-CT from 11/22/2017 FINDINGS: Mediastinal blood pool activity: SUV max 2.2 Liver activity: SUV max 3.9 NECK: Again observed is widespread bilateral adenopathy in the neck with a similar distribution to the prior exam, and with low-grade activity. There is also misregistration of the PET and CT data which throws off the attenuation correction and may adversely affect measured values. A lymph node interposed between the left sternocleidomastoid and upper pectoral muscle measures 1.7 cm in short axis on image 40/4 (formerly 1.5 cm) with maximum SUV 2.4 (formerly 2.0), Deauville 3. Most of the other adenopathy is Deauville 3. Incidental CT findings: none CHEST: Extensive bilateral axillary adenopathy although improved from  prior. The conglomerate adenopathy previously measured in the left axilla measures 2.2 by 3.0 cm on image 57/4, previously 4.9 by 3.3 cm, and has a maximum standard uptake value of 2.1 (Deauville 2), previously 3.2. On the other hand, there is some worsening paratracheal adenopathy, with a right upper paratracheal node measuring 1.0 cm in short axis on image 55/4 (formerly 0.7 cm) with maximum SUV 4.3 (formerly 2.5), Deauville 4. A new left upper lobe peribronchovascular nodule measuring about 1.7 cm in diameter with a maximum SUV of 12.2 (Deauville 5). The lower thoracic periaortic adenopathy is again observed with low-level activity. Incidental CT findings: Old granulomatous disease. ABDOMEN/PELVIS: Considerable mesenteric and retroperitoneal adenopathy is again identified. Some of this is rather bulky. A 4.0 cm in short axis right external iliac node on image 136/4 previously measured 2.4 cm in diameter. Current maximum SUV 3.9 (Deauville 3), previously 3.1. The lymph nodes appear slightly larger on today's exam but also have mildly reduced marginal definition at least partially due to motion artifact. Most of the other lymph nodes are in the Deauville 2 to Deauville 3 category. A right external iliac node measures 1.6 cm in short axis on image 161/4, previously 1.7 cm, with maximum SUV 2.2 (formerly 2.8), Deauville 2. Similar left external iliac adenopathy. Incidental CT findings: Scattered calcified mesenteric lymph nodes. SKELETON: No significant abnormal hypermetabolic activity in this region. Incidental CT findings: none IMPRESSION: 1. New focal nodular hypermetabolic left upper lobe pulmonary nodule, maximum SUV 12.2 compatible with Deauville 5 activity. There is also some worsening in Deauville 4 right upper paratracheal adenopathy. 2. In the neck, there continue to be widespread enlarged lymph nodes similar to the prior exam, currently Deauville 3. 3. In the axillary regions, adenopathy is mildly reduced  both in size and activity, currently Deauville 2. 4. In the abdomen, bulky adenopathy appears mildly increased in size in some locations, but still in the Deauville 2 to Deauville 3 range. Electronically Signed   By: Van Clines M.D.   On: 05/15/2019 15:01    ASSESSMENT & PLAN:  [name] [name] is a 63 y.o. male with:  1. Small B-Cell Lymphoma /Chronic lymphocytic Leukemia.  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 Newly noted Microcytic anemia with drop in hgb from 11.9 to 9.1 with severe iron deficiency. Not primarily related to lymphoma.  12/11/18 CT C/A/P which revealed "Interval progressive disease. Significant increase in adenopathy within the abdomen/pelvis.  More mild enlargement of lymph nodes within the chest. 2. No splenomegaly. 3. Aortic Atherosclerosis."  05/15/2019 PET scan revealing "1. New focal nodular hypermetabolic left upper lobe pulmonary nodule, maximum SUV 12.2 compatible with Deauville 5 activity. There is also some worsening in Deauville 4 right upper paratracheal Adenopathy. 2. In the neck, there continue to be widespread enlarged lymph nodes similar to the prior exam, currently Deauville 3. 3. In the axillary regions, adenopathy is mildly reduced both in size and activity, currently Deauville 2.4. In the abdomen, bulky adenopathy appears mildly increased in size in some locations, but still in the Deauville 2 to Deauville 3 range."  2. Iron deficiency Anemia - unclear etiology -- will need GI workup - he notes that his GI workup was delayed due to COVID related scheduling issues . Today he was again asked to f/u with his GI doctor to have these scheduled.   PLAN: -Discussed pt labwork today, 05/30/19;  all values are WNL except for WBC at 3.6, RBC at 2.99, Hemoglobin at 9.0, HCT at 25.9, lymphs Abs at 0.3, Monocytes Absolute at 0.0, Sodium at 132, Glucose at 106, Total Protein at 6.4, AST at 9, and magnesium at 1.6, - Discussed erectile  dysfunction and advised to decrease narcotic and conduct blood test to test testosterone levels. Also advised that emotional distress could influence erectile functions.  -Advised that he is responding to treatment and lymphedema  swelling has decreased.  -Suggested a Scan after next cycle to determine if treatment can be reduced to 4 cycles.   FOLLOW UP: CT chest/abd/pelvis in 3 weeks Please schedule C3 of Bendamustine/Rituxan as ordered with labs and MD visit  The total time spent in the appt was 25 minutes and more than 50% was on counseling and direct patient cares.  All of the patient's questions were answered with apparent satisfaction. The patient knows to call the clinic with any problems, questions or concerns.   Sullivan Lone MD MS AAHIVMS [facility] Hematology/Oncology Physician [facility]  (Office):       [phone] (Work cell):  [phone] (Fax):           [phone]  05/30/2019 8:50 AM  I, Scot Dock, am acting as a scribe for Dr. Sullivan Lone.   .I have reviewed the above documentation for accuracy and completeness, and I agree with the above. Brunetta Genera MD

## 2019-05-30 NOTE — Progress Notes (Signed)
Met with patient whom brought proof of income for one-time $700 [place] to assist with personal expenses while going through treatment.  Patient approved for the one-time grant. He has a copy of the approval letter and expense sheet along with the Outpatient pharmacy information. Went over expense sheet in detail. He verbalized understanding. He received a gas card today from grant.  He has my card for any additional financial questions or concerns.

## 2019-05-30 NOTE — Progress Notes (Signed)
Verbal order from Dr.Kale: Ok to treat today with Magnesium 1.6

## 2019-05-31 ENCOUNTER — Inpatient Hospital Stay: Payer: Medicare Other

## 2019-05-31 ENCOUNTER — Other Ambulatory Visit: Payer: Self-pay

## 2019-05-31 VITALS — BP 117/55 | HR 74 | Temp 98.7°F | Resp 16

## 2019-05-31 DIAGNOSIS — C9110 Chronic lymphocytic leukemia of B-cell type not having achieved remission: Secondary | ICD-10-CM

## 2019-05-31 DIAGNOSIS — C8308 Small cell B-cell lymphoma, lymph nodes of multiple sites: Secondary | ICD-10-CM | POA: Diagnosis not present

## 2019-05-31 DIAGNOSIS — Z7189 Other specified counseling: Secondary | ICD-10-CM

## 2019-05-31 MED ORDER — DEXAMETHASONE SODIUM PHOSPHATE 10 MG/ML IJ SOLN
INTRAMUSCULAR | Status: AC
  Filled 2019-05-31: qty 1

## 2019-05-31 MED ORDER — SODIUM CHLORIDE 0.9% FLUSH
10.0000 mL | INTRAVENOUS | Status: DC | PRN
  Filled 2019-05-31: qty 10

## 2019-05-31 MED ORDER — SODIUM CHLORIDE 0.9 % IV SOLN
Freq: Once | INTRAVENOUS | Status: DC
  Filled 2019-05-31: qty 250

## 2019-05-31 MED ORDER — HEPARIN SOD (PORK) LOCK FLUSH 100 UNIT/ML IV SOLN
500.0000 [IU] | Freq: Once | INTRAVENOUS | Status: DC | PRN
  Filled 2019-05-31: qty 5

## 2019-05-31 MED ORDER — SODIUM CHLORIDE 0.9 % IV SOLN
90.0000 mg/m2 | Freq: Once | INTRAVENOUS | Status: AC
  Administered 2019-05-31: 175 mg via INTRAVENOUS
  Filled 2019-05-31: qty 7

## 2019-05-31 MED ORDER — DEXAMETHASONE SODIUM PHOSPHATE 10 MG/ML IJ SOLN
10.0000 mg | Freq: Once | INTRAMUSCULAR | Status: AC
  Administered 2019-05-31: 10 mg via INTRAVENOUS

## 2019-05-31 NOTE — Patient Instructions (Signed)
[facility] Discharge Instructions for Patients Receiving Chemotherapy  Today you received the following chemotherapy agents: Bendamustine  To help prevent nausea and vomiting after your treatment, we encourage you to take your nausea medication as directed.    If you develop nausea and vomiting that is not controlled by your nausea medication, call the clinic.   BELOW ARE SYMPTOMS THAT SHOULD BE REPORTED IMMEDIATELY:  *FEVER GREATER THAN 100.5 F  *CHILLS WITH OR WITHOUT FEVER  NAUSEA AND VOMITING THAT IS NOT CONTROLLED WITH YOUR NAUSEA MEDICATION  *UNUSUAL SHORTNESS OF BREATH  *UNUSUAL BRUISING OR BLEEDING  TENDERNESS IN MOUTH AND THROAT WITH OR WITHOUT PRESENCE OF ULCERS  *URINARY PROBLEMS  *BOWEL PROBLEMS  UNUSUAL RASH Items with * indicate a potential emergency and should be followed up as soon as possible.  Feel free to call the clinic should you have any questions or concerns. The clinic phone number is [phone].  Please show the CHEMO ALERT CARD at check-in to the Emergency Department and triage nurse.   

## 2019-06-04 ENCOUNTER — Encounter: Payer: Self-pay | Admitting: Hematology

## 2019-06-04 NOTE — Progress Notes (Signed)
Patient called regarding expenses for [facility] grant submitted.  Advised of acceptable expenses and what is needed(current monthly statements) and how grant can be used.  He has my contact information for any additional financial questions or concerns.

## 2019-06-20 ENCOUNTER — Ambulatory Visit (HOSPITAL_COMMUNITY): Admission: RE | Admit: 2019-06-20 | Payer: Medicare Other | Source: Ambulatory Visit

## 2019-06-27 ENCOUNTER — Other Ambulatory Visit: Payer: Self-pay

## 2019-06-27 ENCOUNTER — Inpatient Hospital Stay: Payer: Medicare Other | Admitting: Hematology

## 2019-06-27 ENCOUNTER — Inpatient Hospital Stay: Payer: Medicare Other

## 2019-06-27 ENCOUNTER — Inpatient Hospital Stay: Payer: Medicare Other | Attending: Hematology

## 2019-06-27 VITALS — BP 136/62 | HR 88 | Temp 97.9°F | Resp 18 | Ht 67.0 in | Wt 169.8 lb

## 2019-06-27 DIAGNOSIS — D509 Iron deficiency anemia, unspecified: Secondary | ICD-10-CM | POA: Insufficient documentation

## 2019-06-27 DIAGNOSIS — I700 Atherosclerosis of aorta: Secondary | ICD-10-CM | POA: Insufficient documentation

## 2019-06-27 DIAGNOSIS — C8308 Small cell B-cell lymphoma, lymph nodes of multiple sites: Secondary | ICD-10-CM | POA: Diagnosis not present

## 2019-06-27 DIAGNOSIS — D649 Anemia, unspecified: Secondary | ICD-10-CM

## 2019-06-27 DIAGNOSIS — C9110 Chronic lymphocytic leukemia of B-cell type not having achieved remission: Secondary | ICD-10-CM | POA: Diagnosis not present

## 2019-06-27 DIAGNOSIS — R197 Diarrhea, unspecified: Secondary | ICD-10-CM

## 2019-06-27 DIAGNOSIS — Z79899 Other long term (current) drug therapy: Secondary | ICD-10-CM | POA: Insufficient documentation

## 2019-06-27 LAB — CBC WITH DIFFERENTIAL/PLATELET
Abs Immature Granulocytes: 0.74 10*3/uL — ABNORMAL HIGH (ref 0.00–0.07)
Basophils Absolute: 0.1 10*3/uL (ref 0.0–0.1)
Basophils Relative: 1 %
Eosinophils Absolute: 0.1 10*3/uL (ref 0.0–0.5)
Eosinophils Relative: 1 %
HCT: 26.4 % — ABNORMAL LOW (ref 39.0–52.0)
Hemoglobin: 8.9 g/dL — ABNORMAL LOW (ref 13.0–17.0)
Immature Granulocytes: 11 %
Lymphocytes Relative: 4 %
Lymphs Abs: 0.3 10*3/uL — ABNORMAL LOW (ref 0.7–4.0)
MCH: 28.8 pg (ref 26.0–34.0)
MCHC: 33.7 g/dL (ref 30.0–36.0)
MCV: 85.4 fL (ref 80.0–100.0)
Monocytes Absolute: 0.7 10*3/uL (ref 0.1–1.0)
Monocytes Relative: 10 %
Neutro Abs: 4.9 10*3/uL (ref 1.7–7.7)
Neutrophils Relative %: 73 %
Platelets: 517 10*3/uL — ABNORMAL HIGH (ref 150–400)
RBC: 3.09 MIL/uL — ABNORMAL LOW (ref 4.22–5.81)
RDW: 15.9 % — ABNORMAL HIGH (ref 11.5–15.5)
WBC: 6.8 10*3/uL (ref 4.0–10.5)
nRBC: 0 % (ref 0.0–0.2)

## 2019-06-27 LAB — CMP (CANCER CENTER ONLY)
ALT: 15 U/L (ref 0–44)
AST: 10 U/L — ABNORMAL LOW (ref 15–41)
Albumin: 3.3 g/dL — ABNORMAL LOW (ref 3.5–5.0)
Alkaline Phosphatase: 67 U/L (ref 38–126)
Anion gap: 10 (ref 5–15)
BUN: 6 mg/dL — ABNORMAL LOW (ref 8–23)
CO2: 22 mmol/L (ref 22–32)
Calcium: 9.4 mg/dL (ref 8.9–10.3)
Chloride: 98 mmol/L (ref 98–111)
Creatinine: 1.16 mg/dL (ref 0.61–1.24)
GFR, Est AFR Am: >60 mL/min (ref >60)
GFR, Estimated: >60 mL/min (ref >60)
Glucose, Bld: 180 mg/dL — ABNORMAL HIGH (ref 70–99)
Potassium: 4.2 mmol/L (ref 3.5–5.1)
Sodium: 130 mmol/L — ABNORMAL LOW (ref 135–145)
Total Bilirubin: 0.4 mg/dL (ref 0.3–1.2)
Total Protein: 7 g/dL (ref 6.5–8.1)

## 2019-06-27 LAB — FERRITIN: Ferritin: 264 ng/mL (ref 24–336)

## 2019-06-27 LAB — IRON AND TIBC
Iron: 11 ug/dL — ABNORMAL LOW (ref 42–163)
Saturation Ratios: 5 % — ABNORMAL LOW (ref 20–55)
TIBC: 246 ug/dL (ref 202–409)
UIBC: 235 ug/dL (ref 117–376)

## 2019-06-27 NOTE — Progress Notes (Signed)
HEMATOLOGY/ONCOLOGY CLINIC NOTE  Date of Service:  06/27/19   Patient Care Team: Bernerd Limbo, MD as PCP - General (Family Medicine) Brunetta Genera, MD as Consulting Physician (Hematology)   CHIEF COMPLAINTS/PURPOSE OF CONSULTATION:  Small B-Cell Lymphoma   HISTORY OF PRESENTING ILLNESS:   [name] [name] is a wonderful 63 y.o. 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 The pt reports that he is doing well overall.   The pt reports that in August 2018 he felt a knot on the left side of his neck, which he spoke about with his PCP Dr. Coletta Memos. He then had an MRI in January 2019, a CXR in February 2019 and then a biopsy on 10/26/17 (results noted below). He denies any constitutional symptoms as noted below. He notes that the swelling on his neck has not increased in size and is not painful.   He notes that he gets congested at night and is only able to breathe out of one nostril; he denies having a deviated septum. He notes that he has tried to have this worked up for the last 4 years without success and has tried saline sprays without success. He notes that he wakes up with dry mouth.   The pt takes Amlodipine, 81mg  Aspirin, Atorvastatin, Cyclobenzaprine, Ferrousul, Losartan Potassium, Meloxicam, Metformin 500mg , Percocet, Protonix, Potassium Chloride, and Ambein.   Of note prior to the patient's visit today, pt has had Lymph node, needle/core biopsy, Left Supraclavicular completed on 10/31/17 with results revealing SMALL LYMPHOCYTIC LYMPHOMA.  10/26/17 Tissue Flow Cytometry revealed a Monoclonal B Cell population identified.    08/28/17 MRI revealed Extensive cervical adenopathy. Adenopathy also noted in the subpectoral nodes.   CXR on 10/11/17 was revealed to be Normal.   His 10/11/17 CBC revealed all values WNL except for % Lymph at 66.3%, Mono % at 3.3% and % Gran at 30.4%, Lymphs Abs at 6.4k, Hgb at 12.5, HCT at 37.5,  MCV at 75.5, MCH at 25.1, RDW at 16.3, MPV at 6.4  On review of systems, pt reports left cervical lymph node swelling and denies fevers, chills, night sweats, unexpected weight loss, CP, SOB, abdominal pain or swelling, pain along the spine, changes in bowel habits, skin rashes, leg swelling, and any other symptoms.    On PMHx the pt reports Hyperlipidemia, Hypertensive reitonpathy, Hypopotassemia, DM Type 2, acid reflux, spinal stenosis.  On Social Hx the pt reports being a former smoker, having quit in 2012. He notes that he smoked about 5 cigars per week before quitting. He denies consuming much ETOH. He denies any recreational drug use. He denies any unsafe needle exposure.  On Family Hx the pt reports an uncle who had cancer but wasn't sure what kind.   INTERVAL HISTORY: [name] [name] returns today regarding his Small B Cell Lymphoma. The patient's last visit with [place] was on 05/30/2019. The pt reports that he is doing well overall.  The pt reports that he is having chills everyday that are accompanied by teeth chattering. Happens at any time. It goes away when he warms up. (Tuesday of this week was the first time he did not experience chills)  He is seeing "black hole" out of his right eyes. He has not seen an ophthalmologist . He says it comes and goes. It is not accompanied with headaches or flashes of lights.   He has been experiencing loose stools. Experiencing water stools 3  to 4 times a day. He is taking imodium and yesterday was the first time he has had solid stools. There is no mucus or blood in the stools.   He has pain in back and shoulders.  Lab results today (06/27/19) of CBC w/diff and CMP is as follows: all values are WNL except for RBC at 3.09, Hemoglobin at 8.9, HCT at 26.4, RDW at 15.9, Platelets at 517, Lymphs Abs at 0.3, Abs Immature Granulocytes at 0.74, Sodium at 130, Glucose Bld at 180, BUN at 6, Albumin at 3.3, AST at 10, Iron at 11, Saturation Ratios at 5.  On  review of systems, pt reports chills, diarrhea, vision problems, back and shoulder pain and denies neuropathy, difficulty passing urine, leg swelling  and any other symptoms.   MEDICAL HISTORY:  Past Medical History:  Diagnosis Date  . Allergic rhinitis, cause unspecified   . Backache, unspecified   . Body mass index 33.0-33.9, adult   . Diabetes mellitus without complication ([place])    Type II  . Elevated blood pressure reading without diagnosis of hypertension   . Esophageal reflux   . Generalized pain   . Heartburn   . Insomnia, unspecified   . 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 Other abnormal blood chemistry   . Other and unspecified hyperlipidemia   . Other dyspnea and respiratory abnormality   . Other nonspecific findings on examination of blood(790.99)   . Pain in joint, lower leg   . Pneumonia    1968  . Sleep apnea    Does not use or own a CPAP  . Tobacco use disorder   . Unspecified essential hypertension     SURGICAL HISTORY: Past Surgical History:  Procedure Laterality Date  . bil wrist surgery    . ENDOSCOPIC CONCHA BULLOSA RESECTION Bilateral 05/18/2018   Procedure: ENDOSCOPIC CONCHA BULLOSA RESECTION;  Surgeon: Jerrell Belfast, MD;  Location: [facility];  Service: ENT;  Laterality: Bilateral;  . NASAL SEPTOPLASTY W/ TURBINOPLASTY Bilateral 05/18/2018   Procedure: NASAL SEPTOPLASTY WITH TURBINATE REDUCTION;  Surgeon: Jerrell Belfast, MD;  Location: [facility];  Service: ENT;  Laterality: Bilateral;  . ORIF FOREARM FRACTURE     right  . plastic surgery to face    . SINUS ENDO W/FUSION Bilateral 05/18/2018   Procedure: ENDOSCOPIC SINUS SURGERY WITH NAVIGATION;  Surgeon: Jerrell Belfast, MD;  Location: [facility];  Service: ENT;  Laterality: Bilateral;    SOCIAL HISTORY: Social History   Socioeconomic History  . Marital status: Legally Separated    Spouse name: Not on file  . Number of children: 1  . Years of education:  Not on file  . Highest education level: Not on file  Occupational History  . Not on file  Social Needs  . Financial resource strain: Not on file  . Food insecurity    Worry: Not on file    Inability: Not on file  . Transportation needs    Medical: Not on file    Non-medical: Not on file  Tobacco Use  . Smoking status: Former Smoker    Packs/day: 0.20    Years: 20.00    Pack years: 4.00    Types: Cigars    Quit date: 2014    Years since quitting: 6.8  . Smokeless tobacco: Never Used  Substance and Sexual Activity  . Alcohol use: Yes    Comment: 2 40oz beers a week  . Drug use: No  . Sexual activity: Not  on file  Lifestyle  . Physical activity    Days per week: Not on file    Minutes per session: Not on file  . Stress: Not on file  Relationships  . Social Herbalist on phone: Not on file    Gets together: Not on file    Attends religious service: Not on file    Active member of club or organization: Not on file    Attends meetings of clubs or organizations: Not on file    Relationship status: Not on file  . Intimate partner violence    Fear of current or ex partner: Not on file    Emotionally abused: Not on file    Physically abused: Not on file    Forced sexual activity: Not on file  Other Topics Concern  . Not on file  Social History Narrative  . Not on file    FAMILY HISTORY: Family History  Problem Relation Age of Onset  . Diabetes Mother   . Hypertension Mother   . Cancer Paternal Uncle     ALLERGIES:  is allergic to lisinopril and tuberculin tests.  MEDICATIONS:  Current Outpatient Medications  Medication Sig Dispense Refill  . acyclovir (ZOVIRAX) 400 MG tablet Take 1 tablet (400 mg total) by mouth daily. 30 tablet 3  . allopurinol (ZYLOPRIM) 300 MG tablet Take 0.5 tablets (150 mg total) by mouth daily. 30 tablet 3  . amLODipine (NORVASC) 10 MG tablet Take 10 mg by mouth daily.    [name] atorvastatin (LIPITOR) 20 MG tablet Take 20 mg by mouth  daily.    . cyclobenzaprine (FLEXERIL) 10 MG tablet Take 10 mg by mouth at bedtime.    [name] dexamethasone (DECADRON) 4 MG tablet Take 2 tablets (8 mg total) by mouth daily. Start the day after bendamustine chemotherapy for 2 days. Take with food. 30 tablet 1  . losartan (COZAAR) 50 MG tablet Take 50 mg by mouth daily.    . meloxicam (MOBIC) 15 MG tablet Take 15 mg by mouth daily.    . metFORMIN (GLUCOPHAGE) 500 MG tablet Take 500 mg by mouth daily.    . ondansetron (ZOFRAN) 8 MG tablet Take 1 tablet (8 mg total) by mouth 2 (two) times daily as needed for refractory nausea / vomiting. Start on day 2 after bendamustine chemotherapy. 30 tablet 1  . oxyCODONE-acetaminophen (PERCOCET/ROXICET) 5-325 MG tablet Take 1 tablet by mouth 3 (three) times daily as needed for pain.  0  . pantoprazole (PROTONIX) 40 MG tablet Take 40 mg by mouth at bedtime.     . potassium chloride SA (K-DUR,KLOR-CON) 20 MEQ tablet Take 20 mEq by mouth daily.    . prochlorperazine (COMPAZINE) 10 MG tablet Take 1 tablet (10 mg total) by mouth every 6 (six) hours as needed (Nausea or vomiting). 30 tablet 1  . zolpidem (AMBIEN) 10 MG tablet Take 10 mg by mouth at bedtime.      No current facility-administered medications for this visit.     REVIEW OF SYSTEMS:   A 10+ POINT REVIEW OF SYSTEMS WAS OBTAINED including neurology, dermatology, psychiatry, cardiac, respiratory, lymph, extremities, GI, GU, Musculoskeletal, constitutional, breasts, reproductive, HEENT.  All pertinent positives are noted in the HPI.  All others are negative.  PHYSICAL EXAMINATION: ECOG FS:1 - Symptomatic but completely ambulatory  Vitals:   06/27/19 1044  BP: 136/62  Pulse: 88  Resp: 18  Temp: 97.9 F (36.6 C)  SpO2: 99%   Wt Readings from Last  3 Encounters:  06/27/19 169 lb 12.8 oz (77 kg)  05/30/19 177 lb 8 oz (80.5 kg)  05/16/19 174 lb 4.8 oz (79.1 kg)   Body mass index is 26.59 kg/m.    GENERAL:alert, in no acute distress and comfortable  SKIN: no acute rashes, no significant lesions EYES: conjunctiva are pink and non-injected, sclera anicteric OROPHARYNX: MMM, no exudates, no oropharyngeal erythema or ulceration NECK: supple, no JVD LYMPH:  no palpable lymphadenopathy in the cervical, axillary or inguinal regions LUNGS: clear to auscultation b/l with normal respiratory effort HEART: regular rate & rhythm ABDOMEN:  normoactive bowel sounds , non tender, not distended. Extremity: no pedal edema PSYCH: alert & oriented x 3 with fluent speech NEURO: no focal motor/sensory deficits     LABORATORY DATA:  I have reviewed the data as listed  . CBC Latest Ref Rng & Units 06/27/2019 05/30/2019 05/16/2019  WBC 4.0 - 10.5 K/uL 6.8 3.6(L) 1.5(L)  Hemoglobin 13.0 - 17.0 g/dL 8.9(L) 9.0(L) 8.6(L)  Hematocrit 39.0 - 52.0 % 26.4(L) 25.9(L) 25.3(L)  Platelets 150 - 400 K/uL 517(H) 235 209    CBC    Component Value Date/Time   WBC 6.8 06/27/2019 1004   RBC 3.09 (L) 06/27/2019 1004   HGB 8.9 (L) 06/27/2019 1004   HGB 11.9 (L) 11/10/2017 1008   HCT 26.4 (L) 06/27/2019 1004   PLT 517 (H) 06/27/2019 1004   PLT 228 11/10/2017 1008   MCV 85.4 06/27/2019 1004   MCH 28.8 06/27/2019 1004   MCHC 33.7 06/27/2019 1004   RDW 15.9 (H) 06/27/2019 1004   LYMPHSABS 0.3 (L) 06/27/2019 1004   MONOABS 0.7 06/27/2019 1004   EOSABS 0.1 06/27/2019 1004   BASOSABS 0.1 06/27/2019 1004    CMP Latest Ref Rng & Units 05/30/2019 05/16/2019 05/02/2019  Glucose 70 - 99 mg/dL 106(H) 101(H) 110(H)  BUN 8 - 23 mg/dL 10 10 11   Creatinine 0.61 - 1.24 mg/dL 1.00 1.20 1.20  Sodium 135 - 145 mmol/L 132(L) 128(L) 137  Potassium 3.5 - 5.1 mmol/L 3.9 4.0 4.1  Chloride 98 - 111 mmol/L 101 97(L) 108  CO2 22 - 32 mmol/L 22 25 22   Calcium 8.9 - 10.3 mg/dL 8.9 8.2(L) 8.9  Total Protein 6.5 - 8.1 g/dL 6.4(L) 6.4(L) 6.9  Total Bilirubin 0.3 - 1.2 mg/dL 0.7 1.2 0.7  Alkaline Phos 38 - 126 U/L 61 56 89  AST 15 - 41 U/L 9(L) 9(L) 14(L)  ALT 0 - 44 U/L 10 8 8    .  Lab Results  Component Value Date   IRON 11 (L) 06/27/2019   TIBC 246 06/27/2019   IRONPCTSAT 5 (L) 06/27/2019   (Iron and TIBC)  Lab Results  Component Value Date   FERRITIN 264 06/27/2019   . Lab Results  Component Value Date   IRON 11 (L) 06/27/2019   TIBC 246 06/27/2019   IRONPCTSAT 5 (L) 06/27/2019   (Iron and TIBC)  Lab Results  Component Value Date   FERRITIN 66 04/04/2019    Component     Latest Ref Rng & Units 11/10/2017  Retic Ct Pct     0.8 - 1.8 % 1.3  RBC.     4.20 - 5.82 MIL/uL 4.75  Retic Count, Absolute     34.8 - 93.9 K/uL 61.8  LDH     125 - 245 U/L 152  HCV Ab     0.0 - 0.9 s/co ratio <0.1  HIV Screen 4th Generation wRfx     Non  Reactive Non Reactive  Hepatitis B Surface Ag     Negative Negative  Hep B Core Ab, Tot     Negative Negative   05/15/2019: NUCLEAR MEDICINE PET SKULL BASE TO THIGH   10/31/17 Tissue Flow Cytometry:     10/31/17 Lymph Node Needle/Core Biopsy:  11/10/17 Peripheral Blood Flow Cytometry:   11/10/17 FISH CLL Prognostic Panel:      RADIOGRAPHIC STUDIES: I have personally reviewed the radiological images as listed and agreed with the findings in the report.  08/28/17 MRI     No results found.  ASSESSMENT & PLAN:  [name] [name] is a 63 y.o. male with:  1. Small B-Cell Lymphoma /Chronic lymphocytic Leukemia.  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 Newly noted Microcytic anemia with drop in hgb from 11.9 to 9.1 with severe iron deficiency. Not primarily related to lymphoma.  12/11/18 CT C/A/P which revealed "Interval progressive disease. Significant increase in adenopathy within the abdomen/pelvis. More mild enlargement of lymph nodes within the chest. 2. No splenomegaly. 3. Aortic Atherosclerosis."  05/15/2019 PET scan revealing "1. New focal nodular hypermetabolic left upper lobe pulmonary nodule, maximum SUV 12.2 compatible with Deauville 5 activity. There is also some worsening in  Deauville 4 right upper paratracheal Adenopathy. 2. In the neck, there continue to be widespread enlarged lymph nodes similar to the prior exam, currently Deauville 3. 3. In the axillary regions, adenopathy is mildly reduced both in size and activity, currently Deauville 2.4. In the abdomen, bulky adenopathy appears mildly increased in size in some locations, but still in the Deauville 2 to Deauville 3 range."  2. Iron deficiency Anemia - unclear etiology -- will need GI workup - he notes that his GI workup was delayed due to COVID related scheduling issues . Today he was again asked to f/u with his GI doctor to have these scheduled.   PLAN: -Discussed pt labwork today, 06/27/19; CBC w/diff and CMP is as follows: all values are WNL except for RBC at 3.09, Hemoglobin at 8.9, HCT at 26.4, RDW at 15.9, Platelets at 517, Lymphs Abs at 0.3, Abs Immature Granulocytes at 0.74, Sodium at 130, Glucose Bld at 180, BUN at 6, Albumin at 3.3, AST at 10, Iron at 11, Saturation Ratios at 5. -Discussed that his chemo treatment does not typically cause occular toxicities -Recommended seeing an ophthalmologist.for his intermittent visual symptoms. -Discussed that during the last visit we discussed scheduling a CT scan. PT stated that he did not have it done because of $500 copay. He does not want to have any more test the rest of the year -Discussed ordering labs for thyroid function. Pt wants to have labs done next time. -Recommended taking a break from treatment  since his condition is slow progressing and given tsignificant improvement in LNadenopathy and decreased lymphoma symptoms. Will start back early next year.  -Discussed that he is slightly anemic and sodium levels are low  -Will hold off on chemotherapy today  -Will order stool testing and f/u CT scans   FOLLOW UP: -cancel currently scheduled treatment appointments -Plz schedule labs and CTchest/abd/pelvis in 2 months -RTC with Dr Irene Limbo in 104month+1 week  -lab today to collect stool container  The total time spent in the appt was 25 minutes and more than 50% was on counseling and direct patient cares.  All of the patient's questions were answered with apparent satisfaction. The patient knows to call the clinic with any problems, questions or concerns.    Shawndra Clute  Irene Limbo MD [place] AAHIVMS [facility] Hematology/Oncology Physician [facility]  (Office):       [phone] (Work cell):  [phone] (Fax):           [phone]  06/27/2019 4:25 PM  I, Scot Dock, am acting as a scribe for Dr. Sullivan Lone.   .I have reviewed the above documentation for accuracy and completeness, and I agree with the above. Brunetta Genera MD

## 2019-06-28 ENCOUNTER — Telehealth: Payer: Self-pay | Admitting: Hematology

## 2019-06-28 ENCOUNTER — Ambulatory Visit: Payer: Medicare Other

## 2019-06-28 NOTE — Telephone Encounter (Signed)
Scheduled appt per 11/5 los.  Was not able to get in contact with the pt.  Sent a staff message to get a calendar mailed out with central radiology number attached to it,

## 2019-07-25 ENCOUNTER — Other Ambulatory Visit: Payer: Medicare Other

## 2019-07-25 ENCOUNTER — Ambulatory Visit: Payer: Medicare Other | Admitting: Hematology

## 2019-07-25 ENCOUNTER — Ambulatory Visit: Payer: Medicare Other

## 2019-07-26 ENCOUNTER — Ambulatory Visit: Payer: Medicare Other

## 2019-08-20 ENCOUNTER — Telehealth: Payer: Self-pay | Admitting: *Deleted

## 2019-08-20 NOTE — Telephone Encounter (Signed)
Patient contacted office to verify times of upcoming appts. Patient had not made CT appt yet. CT scheduled on patient's behalf. Contacted patient with all appt times and following instructions: NPO 4 hours before CT scheduled at 2:30 pm (1/6), drink one bottle  Redi-Cat at 1230 and one bottle at 130 pm. Arrive in [facility] radiology at 2:15pm. Encouraged patient to contact office for further questions.

## 2019-08-27 ENCOUNTER — Inpatient Hospital Stay: Payer: Medicare Other | Attending: Hematology

## 2019-08-27 ENCOUNTER — Other Ambulatory Visit: Payer: Self-pay

## 2019-08-27 DIAGNOSIS — Z79899 Other long term (current) drug therapy: Secondary | ICD-10-CM | POA: Insufficient documentation

## 2019-08-27 DIAGNOSIS — C9110 Chronic lymphocytic leukemia of B-cell type not having achieved remission: Secondary | ICD-10-CM

## 2019-08-27 DIAGNOSIS — I639 Cerebral infarction, unspecified: Secondary | ICD-10-CM | POA: Diagnosis not present

## 2019-08-27 DIAGNOSIS — D509 Iron deficiency anemia, unspecified: Secondary | ICD-10-CM | POA: Insufficient documentation

## 2019-08-27 DIAGNOSIS — I6381 Other cerebral infarction due to occlusion or stenosis of small artery: Secondary | ICD-10-CM | POA: Diagnosis not present

## 2019-08-27 DIAGNOSIS — I700 Atherosclerosis of aorta: Secondary | ICD-10-CM | POA: Insufficient documentation

## 2019-08-27 DIAGNOSIS — C8308 Small cell B-cell lymphoma, lymph nodes of multiple sites: Secondary | ICD-10-CM | POA: Insufficient documentation

## 2019-08-27 DIAGNOSIS — R197 Diarrhea, unspecified: Secondary | ICD-10-CM

## 2019-08-27 LAB — CMP (CANCER CENTER ONLY)
ALT: 8 U/L (ref 0–44)
AST: 14 U/L — ABNORMAL LOW (ref 15–41)
Albumin: 3.9 g/dL (ref 3.5–5.0)
Alkaline Phosphatase: 59 U/L (ref 38–126)
Anion gap: 9 (ref 5–15)
BUN: 10 mg/dL (ref 8–23)
CO2: 24 mmol/L (ref 22–32)
Calcium: 9.4 mg/dL (ref 8.9–10.3)
Chloride: 96 mmol/L — ABNORMAL LOW (ref 98–111)
Creatinine: 0.97 mg/dL (ref 0.61–1.24)
GFR, Est AFR Am: >60 mL/min (ref >60)
GFR, Estimated: >60 mL/min (ref >60)
Glucose, Bld: 169 mg/dL — ABNORMAL HIGH (ref 70–99)
Potassium: 3.6 mmol/L (ref 3.5–5.1)
Sodium: 129 mmol/L — ABNORMAL LOW (ref 135–145)
Total Bilirubin: 1.7 mg/dL — ABNORMAL HIGH (ref 0.3–1.2)
Total Protein: 7 g/dL (ref 6.5–8.1)

## 2019-08-27 LAB — CBC WITH DIFFERENTIAL/PLATELET
Abs Immature Granulocytes: 0 10*3/uL (ref 0.00–0.07)
Basophils Absolute: 0 10*3/uL (ref 0.0–0.1)
Basophils Relative: 0 %
Eosinophils Absolute: 0.2 10*3/uL (ref 0.0–0.5)
Eosinophils Relative: 6 %
HCT: 28.8 % — ABNORMAL LOW (ref 39.0–52.0)
Hemoglobin: 9.7 g/dL — ABNORMAL LOW (ref 13.0–17.0)
Lymphocytes Relative: 11 %
Lymphs Abs: 0.5 10*3/uL — ABNORMAL LOW (ref 0.7–4.0)
MCH: 24.1 pg — ABNORMAL LOW (ref 26.0–34.0)
MCHC: 33.7 g/dL (ref 30.0–36.0)
MCV: 71.5 fL — ABNORMAL LOW (ref 80.0–100.0)
Monocytes Absolute: 0.8 10*3/uL (ref 0.1–1.0)
Monocytes Relative: 19 %
Neutro Abs: 2.6 10*3/uL (ref 1.7–7.7)
Neutrophils Relative %: 64 %
Platelets: 313 10*3/uL (ref 150–400)
RBC: 4.03 MIL/uL — ABNORMAL LOW (ref 4.22–5.81)
RDW: 17.2 % — ABNORMAL HIGH (ref 11.5–15.5)
WBC: 4.1 10*3/uL (ref 4.0–10.5)
nRBC: 0 % (ref 0.0–0.2)

## 2019-08-27 LAB — MAGNESIUM: Magnesium: 1.8 mg/dL (ref 1.7–2.4)

## 2019-08-27 LAB — TSH: TSH: 1.055 u[IU]/mL (ref 0.320–4.118)

## 2019-08-28 ENCOUNTER — Ambulatory Visit (HOSPITAL_COMMUNITY)
Admission: RE | Admit: 2019-08-28 | Discharge: 2019-08-28 | Disposition: A | Discharge status: Home / Self Care | Payer: Medicare Other | Source: Ambulatory Visit | Attending: Hematology | Admitting: Hematology

## 2019-08-28 DIAGNOSIS — C8308 Small cell B-cell lymphoma, lymph nodes of multiple sites: Secondary | ICD-10-CM | POA: Insufficient documentation

## 2019-08-28 MED ORDER — SODIUM CHLORIDE (PF) 0.9 % IJ SOLN
INTRAMUSCULAR | Status: AC
  Filled 2019-08-28: qty 50

## 2019-08-28 MED ORDER — IOHEXOL 9 MG/ML PO SOLN
ORAL | Status: AC
  Filled 2019-08-28: qty 1000

## 2019-08-28 MED ORDER — IOHEXOL 9 MG/ML PO SOLN
1000.0000 mL | ORAL | Status: AC
  Administered 2019-08-28: 1000 mL via ORAL

## 2019-08-28 MED ORDER — IOHEXOL 300 MG/ML  SOLN
100.0000 mL | Freq: Once | INTRAMUSCULAR | Status: AC | PRN
  Administered 2019-08-28: 15:00:00 100 mL via INTRAVENOUS

## 2019-08-29 ENCOUNTER — Encounter (HOSPITAL_COMMUNITY): Payer: Self-pay | Admitting: *Deleted

## 2019-08-29 ENCOUNTER — Inpatient Hospital Stay (HOSPITAL_COMMUNITY)
Admission: EM | Admit: 2019-08-29 | Discharge: 2019-09-03 | DRG: 065 | Disposition: A | Discharge status: Rehabilitation Facility | Payer: Medicare Other | Attending: Internal Medicine | Admitting: Internal Medicine

## 2019-08-29 ENCOUNTER — Inpatient Hospital Stay (HOSPITAL_COMMUNITY): Payer: Medicare Other

## 2019-08-29 ENCOUNTER — Other Ambulatory Visit: Payer: Self-pay

## 2019-08-29 ENCOUNTER — Emergency Department (HOSPITAL_COMMUNITY): Payer: Medicare Other

## 2019-08-29 DIAGNOSIS — E119 Type 2 diabetes mellitus without complications: Secondary | ICD-10-CM | POA: Diagnosis not present

## 2019-08-29 DIAGNOSIS — E785 Hyperlipidemia, unspecified: Secondary | ICD-10-CM | POA: Diagnosis present

## 2019-08-29 DIAGNOSIS — Z833 Family history of diabetes mellitus: Secondary | ICD-10-CM | POA: Diagnosis not present

## 2019-08-29 DIAGNOSIS — R05 Cough: Secondary | ICD-10-CM | POA: Diagnosis not present

## 2019-08-29 DIAGNOSIS — J309 Allergic rhinitis, unspecified: Secondary | ICD-10-CM | POA: Diagnosis present

## 2019-08-29 DIAGNOSIS — D649 Anemia, unspecified: Secondary | ICD-10-CM | POA: Diagnosis not present

## 2019-08-29 DIAGNOSIS — D72819 Decreased white blood cell count, unspecified: Secondary | ICD-10-CM | POA: Diagnosis not present

## 2019-08-29 DIAGNOSIS — Z7289 Other problems related to lifestyle: Secondary | ICD-10-CM | POA: Diagnosis not present

## 2019-08-29 DIAGNOSIS — I69354 Hemiplegia and hemiparesis following cerebral infarction affecting left non-dominant side: Secondary | ICD-10-CM | POA: Diagnosis not present

## 2019-08-29 DIAGNOSIS — E871 Hypo-osmolality and hyponatremia: Secondary | ICD-10-CM | POA: Diagnosis not present

## 2019-08-29 DIAGNOSIS — G4730 Sleep apnea, unspecified: Secondary | ICD-10-CM | POA: Diagnosis present

## 2019-08-29 DIAGNOSIS — I10 Essential (primary) hypertension: Secondary | ICD-10-CM | POA: Diagnosis present

## 2019-08-29 DIAGNOSIS — C8330 Diffuse large B-cell lymphoma, unspecified site: Secondary | ICD-10-CM | POA: Diagnosis not present

## 2019-08-29 DIAGNOSIS — R29706 NIHSS score 6: Secondary | ICD-10-CM | POA: Diagnosis present

## 2019-08-29 DIAGNOSIS — E1151 Type 2 diabetes mellitus with diabetic peripheral angiopathy without gangrene: Secondary | ICD-10-CM | POA: Diagnosis present

## 2019-08-29 DIAGNOSIS — Z20822 Contact with and (suspected) exposure to covid-19: Secondary | ICD-10-CM | POA: Diagnosis present

## 2019-08-29 DIAGNOSIS — I639 Cerebral infarction, unspecified: Secondary | ICD-10-CM | POA: Diagnosis not present

## 2019-08-29 DIAGNOSIS — G4733 Obstructive sleep apnea (adult) (pediatric): Secondary | ICD-10-CM | POA: Diagnosis not present

## 2019-08-29 DIAGNOSIS — R509 Fever, unspecified: Secondary | ICD-10-CM | POA: Diagnosis not present

## 2019-08-29 DIAGNOSIS — I6381 Other cerebral infarction due to occlusion or stenosis of small artery: Principal | ICD-10-CM | POA: Diagnosis present

## 2019-08-29 DIAGNOSIS — I6350 Cerebral infarction due to unspecified occlusion or stenosis of unspecified cerebral artery: Secondary | ICD-10-CM | POA: Diagnosis not present

## 2019-08-29 DIAGNOSIS — Z8249 Family history of ischemic heart disease and other diseases of the circulatory system: Secondary | ICD-10-CM | POA: Diagnosis not present

## 2019-08-29 DIAGNOSIS — G8194 Hemiplegia, unspecified affecting left nondominant side: Secondary | ICD-10-CM | POA: Diagnosis present

## 2019-08-29 DIAGNOSIS — R0989 Other specified symptoms and signs involving the circulatory and respiratory systems: Secondary | ICD-10-CM | POA: Diagnosis not present

## 2019-08-29 DIAGNOSIS — G479 Sleep disorder, unspecified: Secondary | ICD-10-CM | POA: Diagnosis not present

## 2019-08-29 DIAGNOSIS — R918 Other nonspecific abnormal finding of lung field: Secondary | ICD-10-CM | POA: Diagnosis not present

## 2019-08-29 DIAGNOSIS — E8809 Other disorders of plasma-protein metabolism, not elsewhere classified: Secondary | ICD-10-CM | POA: Diagnosis not present

## 2019-08-29 DIAGNOSIS — C9110 Chronic lymphocytic leukemia of B-cell type not having achieved remission: Secondary | ICD-10-CM | POA: Diagnosis present

## 2019-08-29 DIAGNOSIS — Z8572 Personal history of non-Hodgkin lymphomas: Secondary | ICD-10-CM | POA: Diagnosis not present

## 2019-08-29 DIAGNOSIS — D72810 Lymphocytopenia: Secondary | ICD-10-CM | POA: Diagnosis not present

## 2019-08-29 DIAGNOSIS — Z7984 Long term (current) use of oral hypoglycemic drugs: Secondary | ICD-10-CM

## 2019-08-29 DIAGNOSIS — Z8679 Personal history of other diseases of the circulatory system: Secondary | ICD-10-CM | POA: Diagnosis not present

## 2019-08-29 DIAGNOSIS — E875 Hyperkalemia: Secondary | ICD-10-CM | POA: Diagnosis present

## 2019-08-29 DIAGNOSIS — Z9221 Personal history of antineoplastic chemotherapy: Secondary | ICD-10-CM

## 2019-08-29 DIAGNOSIS — Z79899 Other long term (current) drug therapy: Secondary | ICD-10-CM | POA: Diagnosis not present

## 2019-08-29 DIAGNOSIS — F329 Major depressive disorder, single episode, unspecified: Secondary | ICD-10-CM | POA: Diagnosis not present

## 2019-08-29 DIAGNOSIS — Z888 Allergy status to other drugs, medicaments and biological substances status: Secondary | ICD-10-CM | POA: Diagnosis not present

## 2019-08-29 DIAGNOSIS — Z887 Allergy status to serum and vaccine status: Secondary | ICD-10-CM | POA: Diagnosis not present

## 2019-08-29 DIAGNOSIS — R471 Dysarthria and anarthria: Secondary | ICD-10-CM | POA: Diagnosis present

## 2019-08-29 DIAGNOSIS — E46 Unspecified protein-calorie malnutrition: Secondary | ICD-10-CM | POA: Diagnosis not present

## 2019-08-29 DIAGNOSIS — I69322 Dysarthria following cerebral infarction: Secondary | ICD-10-CM | POA: Diagnosis not present

## 2019-08-29 DIAGNOSIS — E1165 Type 2 diabetes mellitus with hyperglycemia: Secondary | ICD-10-CM | POA: Diagnosis not present

## 2019-08-29 DIAGNOSIS — E876 Hypokalemia: Secondary | ICD-10-CM | POA: Diagnosis not present

## 2019-08-29 DIAGNOSIS — K219 Gastro-esophageal reflux disease without esophagitis: Secondary | ICD-10-CM | POA: Diagnosis present

## 2019-08-29 DIAGNOSIS — I361 Nonrheumatic tricuspid (valve) insufficiency: Secondary | ICD-10-CM | POA: Diagnosis not present

## 2019-08-29 DIAGNOSIS — Z87891 Personal history of nicotine dependence: Secondary | ICD-10-CM

## 2019-08-29 DIAGNOSIS — G4700 Insomnia, unspecified: Secondary | ICD-10-CM | POA: Diagnosis present

## 2019-08-29 DIAGNOSIS — R7309 Other abnormal glucose: Secondary | ICD-10-CM | POA: Diagnosis not present

## 2019-08-29 DIAGNOSIS — Z856 Personal history of leukemia: Secondary | ICD-10-CM | POA: Diagnosis not present

## 2019-08-29 LAB — I-STAT CHEM 8, ED
BUN: 11 mg/dL (ref 8–23)
BUN: 9 mg/dL (ref 8–23)
Calcium, Ion: 1.12 mmol/L — ABNORMAL LOW (ref 1.15–1.40)
Calcium, Ion: 1.17 mmol/L (ref 1.15–1.40)
Chloride: 94 mmol/L — ABNORMAL LOW (ref 98–111)
Chloride: 95 mmol/L — ABNORMAL LOW (ref 98–111)
Creatinine, Ser: 0.8 mg/dL (ref 0.61–1.24)
Creatinine, Ser: 0.8 mg/dL (ref 0.61–1.24)
Glucose, Bld: 108 mg/dL — ABNORMAL HIGH (ref 70–99)
Glucose, Bld: 111 mg/dL — ABNORMAL HIGH (ref 70–99)
HCT: 29 % — ABNORMAL LOW (ref 39.0–52.0)
HCT: 30 % — ABNORMAL LOW (ref 39.0–52.0)
Hemoglobin: 10.2 g/dL — ABNORMAL LOW (ref 13.0–17.0)
Hemoglobin: 9.9 g/dL — ABNORMAL LOW (ref 13.0–17.0)
Potassium: 4 mmol/L (ref 3.5–5.1)
Potassium: 5.4 mmol/L — ABNORMAL HIGH (ref 3.5–5.1)
Sodium: 126 mmol/L — ABNORMAL LOW (ref 135–145)
Sodium: 128 mmol/L — ABNORMAL LOW (ref 135–145)
TCO2: 27 mmol/L (ref 22–32)
TCO2: 28 mmol/L (ref 22–32)

## 2019-08-29 LAB — PROTIME-INR
INR: 1 (ref 0.8–1.2)
Prothrombin Time: 12.9 seconds (ref 11.4–15.2)

## 2019-08-29 LAB — URINALYSIS, ROUTINE W REFLEX MICROSCOPIC
Bilirubin Urine: NEGATIVE
Glucose, UA: NEGATIVE mg/dL
Hgb urine dipstick: NEGATIVE
Ketones, ur: NEGATIVE mg/dL
Leukocytes,Ua: NEGATIVE
Nitrite: NEGATIVE
Protein, ur: NEGATIVE mg/dL
Specific Gravity, Urine: 1.014 (ref 1.005–1.030)
pH: 6 (ref 5.0–8.0)

## 2019-08-29 LAB — CBG MONITORING, ED: Glucose-Capillary: 101 mg/dL — ABNORMAL HIGH (ref 70–99)

## 2019-08-29 LAB — RAPID URINE DRUG SCREEN, HOSP PERFORMED
Amphetamines: NOT DETECTED
Barbiturates: NOT DETECTED
Benzodiazepines: NOT DETECTED
Cocaine: NOT DETECTED
Opiates: NOT DETECTED
Tetrahydrocannabinol: NOT DETECTED

## 2019-08-29 LAB — DIFFERENTIAL
Abs Immature Granulocytes: 0 10*3/uL (ref 0.00–0.07)
Basophils Absolute: 0.1 10*3/uL (ref 0.0–0.1)
Basophils Relative: 3 %
Eosinophils Absolute: 0.1 10*3/uL (ref 0.0–0.5)
Eosinophils Relative: 2 %
Lymphocytes Relative: 4 %
Lymphs Abs: 0.2 10*3/uL — ABNORMAL LOW (ref 0.7–4.0)
Monocytes Absolute: 0.7 10*3/uL (ref 0.1–1.0)
Monocytes Relative: 17 %
Neutro Abs: 3.2 10*3/uL (ref 1.7–7.7)
Neutrophils Relative %: 74 %

## 2019-08-29 LAB — COMPREHENSIVE METABOLIC PANEL
ALT: 8 U/L (ref 0–44)
AST: 18 U/L (ref 15–41)
Albumin: 4 g/dL (ref 3.5–5.0)
Alkaline Phosphatase: 61 U/L (ref 38–126)
Anion gap: 11 (ref 5–15)
BUN: 8 mg/dL (ref 8–23)
CO2: 23 mmol/L (ref 22–32)
Calcium: 9.7 mg/dL (ref 8.9–10.3)
Chloride: 94 mmol/L — ABNORMAL LOW (ref 98–111)
Creatinine, Ser: 0.86 mg/dL (ref 0.61–1.24)
GFR calc Af Amer: >60 mL/min (ref >60)
GFR calc non Af Amer: >60 mL/min (ref >60)
Glucose, Bld: 115 mg/dL — ABNORMAL HIGH (ref 70–99)
Potassium: 4.1 mmol/L (ref 3.5–5.1)
Sodium: 128 mmol/L — ABNORMAL LOW (ref 135–145)
Total Bilirubin: 1.3 mg/dL — ABNORMAL HIGH (ref 0.3–1.2)
Total Protein: 7 g/dL (ref 6.5–8.1)

## 2019-08-29 LAB — CBC
HCT: 28.8 % — ABNORMAL LOW (ref 39.0–52.0)
Hemoglobin: 9.8 g/dL — ABNORMAL LOW (ref 13.0–17.0)
MCH: 24.9 pg — ABNORMAL LOW (ref 26.0–34.0)
MCHC: 34 g/dL (ref 30.0–36.0)
MCV: 73.1 fL — ABNORMAL LOW (ref 80.0–100.0)
Platelets: 310 10*3/uL (ref 150–400)
RBC: 3.94 MIL/uL — ABNORMAL LOW (ref 4.22–5.81)
RDW: 17.3 % — ABNORMAL HIGH (ref 11.5–15.5)
WBC: 4.3 10*3/uL (ref 4.0–10.5)
nRBC: 0 % (ref 0.0–0.2)

## 2019-08-29 LAB — APTT: aPTT: 32 seconds (ref 24–36)

## 2019-08-29 LAB — HEMOGLOBIN A1C
Hgb A1c MFr Bld: 5.9 % — ABNORMAL HIGH (ref 4.8–5.6)
Mean Plasma Glucose: 122.63 mg/dL

## 2019-08-29 LAB — POC SARS CORONAVIRUS 2 AG -  ED: SARS Coronavirus 2 Ag: NEGATIVE

## 2019-08-29 MED ORDER — STROKE: EARLY STAGES OF RECOVERY BOOK: Freq: Once | Status: DC

## 2019-08-29 MED ORDER — SODIUM CHLORIDE 0.9 % IV SOLN
100.0000 mL/h | INTRAVENOUS | Status: DC
  Administered 2019-08-29 – 2019-09-03 (×9): 100 mL/h via INTRAVENOUS

## 2019-08-29 MED ORDER — ACETAMINOPHEN 650 MG RE SUPP: 650.0000 mg | RECTAL | Status: DC | PRN

## 2019-08-29 MED ORDER — ASPIRIN 300 MG RE SUPP: 300.0000 mg | Freq: Every day | RECTAL | Status: DC

## 2019-08-29 MED ORDER — SODIUM CHLORIDE 0.9 % IV BOLUS
500.0000 mL | Freq: Once | INTRAVENOUS | Status: AC
  Administered 2019-08-29: 500 mL via INTRAVENOUS

## 2019-08-29 MED ORDER — ATORVASTATIN CALCIUM 10 MG PO TABS
20.0000 mg | ORAL_TABLET | Freq: Every day | ORAL | Status: DC
  Administered 2019-08-30 – 2019-09-03 (×5): 20 mg via ORAL
  Filled 2019-08-29 (×4): qty 2

## 2019-08-29 MED ORDER — ACETAMINOPHEN 325 MG PO TABS
650.0000 mg | ORAL_TABLET | ORAL | Status: DC | PRN
  Administered 2019-09-03: 650 mg via ORAL
  Filled 2019-08-29: qty 2

## 2019-08-29 MED ORDER — ENOXAPARIN SODIUM 40 MG/0.4ML ~~LOC~~ SOLN
40.0000 mg | SUBCUTANEOUS | Status: DC
  Administered 2019-08-30 – 2019-09-02 (×4): 40 mg via SUBCUTANEOUS
  Filled 2019-08-29 (×4): qty 0.4

## 2019-08-29 MED ORDER — INSULIN ASPART 100 UNIT/ML ~~LOC~~ SOLN
0.0000 [IU] | Freq: Three times a day (TID) | SUBCUTANEOUS | Status: DC
  Administered 2019-08-30 (×2): 1 [IU] via SUBCUTANEOUS

## 2019-08-29 MED ORDER — ASPIRIN 325 MG PO TABS
325.0000 mg | ORAL_TABLET | Freq: Every day | ORAL | Status: DC
  Administered 2019-08-30: 325 mg via ORAL
  Filled 2019-08-29: qty 1

## 2019-08-29 MED ORDER — ACETAMINOPHEN 160 MG/5ML PO SOLN: 650.0000 mg | ORAL | Status: DC | PRN

## 2019-08-29 NOTE — ED Notes (Signed)
ED Provider at bedside. (no code stroke per neuro at the bridge on arrival to ED)

## 2019-08-29 NOTE — ED Notes (Signed)
Patient transported to CT 

## 2019-08-29 NOTE — ED Notes (Signed)
Updated pt contact ([name] [name]), pt spoke to her as well. Pt transported to MRI

## 2019-08-29 NOTE — H&P (Signed)
History and Physical    [name] [name] DOB: 01/06/1956 DOA: 08/29/2019  PCP: Bernerd Limbo, MD  Patient coming from: Home  I have personally briefly reviewed patient's old medical records in [place]  Chief Complaint: L sided weakness  HPI: [name] [name] is a 64 y.o. male with medical history significant of CLL / small B cell lymphoma, DM2, HTN.  Patient with L sided weakness in upper and lower extremities onset yesterday.  Unclear if patients symptoms changed throughout day, but they are persistent.  No confusion, no fever, no SOB.   ED Course: Work up in ED reveals acute ischemic stroke on MRI.   Review of Systems: As per HPI, otherwise all review of systems negative.  Past Medical History:  Diagnosis Date  . Allergic rhinitis, cause unspecified   . Backache, unspecified   . Body mass index 33.0-33.9, adult   . Diabetes mellitus without complication ([place])    Type II  . Elevated blood pressure reading without diagnosis of hypertension   . Esophageal reflux   . Generalized pain   . Heartburn   . Insomnia, unspecified   . 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 Other abnormal blood chemistry   . Other and unspecified hyperlipidemia   . Other dyspnea and respiratory abnormality   . Other nonspecific findings on examination of blood(790.99)   . Pain in joint, lower leg   . Pneumonia    1968  . Sleep apnea    Does not use or own a CPAP  . Tobacco use disorder   . Unspecified essential hypertension     Past Surgical History:  Procedure Laterality Date  . bil wrist surgery    . ENDOSCOPIC CONCHA BULLOSA RESECTION Bilateral 05/18/2018   Procedure: ENDOSCOPIC CONCHA BULLOSA RESECTION;  Surgeon: Jerrell Belfast, MD;  Location: [facility];  Service: ENT;  Laterality: Bilateral;  . NASAL SEPTOPLASTY W/ TURBINOPLASTY Bilateral 05/18/2018   Procedure: NASAL SEPTOPLASTY WITH TURBINATE REDUCTION;  Surgeon: Jerrell Belfast, MD;  Location: [facility];  Service: ENT;  Laterality: Bilateral;  . ORIF FOREARM FRACTURE     right  . plastic surgery to face    . SINUS ENDO W/FUSION Bilateral 05/18/2018   Procedure: ENDOSCOPIC SINUS SURGERY WITH NAVIGATION;  Surgeon: Jerrell Belfast, MD;  Location: [facility];  Service: ENT;  Laterality: Bilateral;     reports that he quit smoking about 7 years ago. His smoking use included cigars. He has a 4.00 pack-year smoking history. He has never used smokeless tobacco. He reports current alcohol use. He reports that he does not use drugs.  Allergies  Allergen Reactions  . Lisinopril Itching  . Tuberculin Tests Swelling    Family History  Problem Relation Age of Onset  . Diabetes Mother   . Hypertension Mother   . Cancer Paternal Uncle      Prior to Admission medications   Medication Sig Start Date End Date Taking? Authorizing Provider  acyclovir (ZOVIRAX) 400 MG tablet Take 1 tablet (400 mg total) by mouth daily. 04/25/19   Brunetta Genera, MD  allopurinol (ZYLOPRIM) 300 MG tablet Take 0.5 tablets (150 mg total) by mouth daily. 04/25/19   Brunetta Genera, MD  amLODipine (NORVASC) 10 MG tablet Take 10 mg by mouth daily.    [provider]  atorvastatin (LIPITOR) 20 MG tablet Take 20 mg by mouth daily.    [provider]  cyclobenzaprine (FLEXERIL) 10 MG tablet Take 10  mg by mouth at bedtime.    [provider]  dexamethasone (DECADRON) 4 MG tablet Take 2 tablets (8 mg total) by mouth daily. Start the day after bendamustine chemotherapy for 2 days. Take with food. 04/25/19   Brunetta Genera, MD  losartan (COZAAR) 50 MG tablet Take 50 mg by mouth daily.    [provider]  ondansetron (ZOFRAN) 8 MG tablet Take 1 tablet (8 mg total) by mouth 2 (two) times daily as needed for refractory nausea / vomiting. Start on day 2 after bendamustine chemotherapy. 04/25/19   Brunetta Genera, MD  oxyCODONE-acetaminophen (PERCOCET/ROXICET) 5-325  MG tablet Take 1 tablet by mouth 3 (three) times daily as needed for pain. 04/21/18   [provider]  pantoprazole (PROTONIX) 40 MG tablet Take 40 mg by mouth at bedtime.     [provider]  potassium chloride SA (K-DUR,KLOR-CON) 20 MEQ tablet Take 20 mEq by mouth daily.    [provider]  prochlorperazine (COMPAZINE) 10 MG tablet Take 1 tablet (10 mg total) by mouth every 6 (six) hours as needed (Nausea or vomiting). 04/25/19   Brunetta Genera, MD  zolpidem (AMBIEN) 10 MG tablet Take 10 mg by mouth at bedtime.     [provider]    Physical Exam: Vitals:   08/29/19 1920 08/29/19 1923  BP: 135/69   Pulse: 78   Resp: 16   Temp: 98 F (36.7 C)   TempSrc: Oral   SpO2: 98%   Weight:  68 kg  Height:  5\' 7"  (1.702 m)    Constitutional: NAD, calm, comfortable Eyes: PERRL, lids and conjunctivae normal ENMT: Mucous membranes are moist. Posterior pharynx clear of any exudate or lesions.Normal dentition.  Neck: normal, supple, no masses, no thyromegaly Respiratory: clear to auscultation bilaterally, no wheezing, no crackles. Normal respiratory effort. No accessory muscle use.  Cardiovascular: Regular rate and rhythm, no murmurs / rubs / gallops. No extremity edema. 2+ pedal pulses. No carotid bruits.  Abdomen: no tenderness, no masses palpated. No hepatosplenomegaly. Bowel sounds positive.  Musculoskeletal: no clubbing / cyanosis. No joint deformity upper and lower extremities. Good ROM, no contractures. Normal muscle tone.  Skin: no rashes, lesions, ulcers. No induration Neurologic: Dysarthria present, 3/5 strength on L, 5/5 on R Psychiatric: Normal judgment and insight. Alert and oriented x 3. Normal mood.    Labs on Admission: I have personally reviewed following labs and imaging studies  CBC: Recent Labs  Lab 08/27/19 0845 08/29/19 1931 08/29/19 1947 08/29/19 2004  WBC 4.1 4.3  --   --   NEUTROABS 2.6 3.2  --   --   HGB 9.7* 9.8* 9.9*  10.2*  HCT 28.8* 28.8* 29.0* 30.0*  MCV 71.5* 73.1*  --   --   PLT 313 310  --   --    Basic Metabolic Panel: Recent Labs  Lab 08/27/19 0845 08/29/19 1931 08/29/19 1947 08/29/19 2004  NA 129* 128* 128* 126*  K 3.6 4.1 4.0 5.4*  CL 96* 94* 94* 95*  CO2 24 23  --   --   GLUCOSE 169* 115* 111* 108*  BUN 10 8 9 11   CREATININE 0.97 0.86 0.80 0.80  CALCIUM 9.4 9.7  --   --   MG 1.8  --   --   --    GFR: Estimated Creatinine Clearance: 89.5 mL/min (by C-G formula based on SCr of 0.8 mg/dL). Liver Function Tests: Recent Labs  Lab 08/27/19 0845 08/29/19 1931  AST  14* 18  ALT 8 8  ALKPHOS 59 61  BILITOT 1.7* 1.3*  PROT 7.0 7.0  ALBUMIN 3.9 4.0   No results for input(s): LIPASE, AMYLASE in the last 168 hours. No results for input(s): AMMONIA in the last 168 hours. Coagulation Profile: Recent Labs  Lab 08/29/19 1931  INR 1.0   Cardiac Enzymes: No results for input(s): CKTOTAL, CKMB, CKMBINDEX, TROPONINI in the last 168 hours. BNP (last 3 results) No results for input(s): PROBNP in the last 8760 hours. HbA1C: No results for input(s): HGBA1C in the last 72 hours. CBG: No results for input(s): GLUCAP in the last 168 hours. Lipid Profile: No results for input(s): CHOL, HDL, LDLCALC, TRIG, CHOLHDL, LDLDIRECT in the last 72 hours. Thyroid Function Tests: Recent Labs    08/27/19 0845  TSH 1.055   Anemia Panel: No results for input(s): VITAMINB12, FOLATE, FERRITIN, TIBC, IRON, RETICCTPCT in the last 72 hours. Urine analysis:    Component Value Date/Time   COLORURINE YELLOW 08/29/2019 2118   APPEARANCEUR CLEAR 08/29/2019 2118   LABSPEC 1.014 08/29/2019 2118   PHURINE 6.0 08/29/2019 2118   GLUCOSEU NEGATIVE 08/29/2019 2118   HGBUR NEGATIVE 08/29/2019 2118   BILIRUBINUR NEGATIVE 08/29/2019 2118   [place] NEGATIVE 08/29/2019 2118   PROTEINUR NEGATIVE 08/29/2019 2118   NITRITE NEGATIVE 08/29/2019 2118   LEUKOCYTESUR NEGATIVE 08/29/2019 2118    Radiological Exams  on Admission: CT HEAD WO CONTRAST  Result Date: 08/29/2019 CLINICAL DATA:  64 year old male with focal neurologic deficit. EXAM: CT HEAD WITHOUT CONTRAST TECHNIQUE: Contiguous axial images were obtained from the base of the skull through the vertex without intravenous contrast. COMPARISON:  None. FINDINGS: Brain: The ventricles and sulci appropriate size for patient's age. The gray-white matter discrimination is preserved. There is no acute intracranial hemorrhage. No mass effect or midline shift. No extra-axial fluid collection. Vascular: No hyperdense vessel or unexpected calcification. Skull: Normal. Negative for fracture or focal lesion. Sinuses/Orbits: Mild mucoperiosteal thickening of paranasal sinuses. The mastoid air cells are clear. Other: Prior fixation of left zygomatic fracture. IMPRESSION: Unremarkable noncontrast CT of the brain. Electronically Signed   By: Anner Crete M.D.   On: 08/29/2019 19:58   CT Chest W Contrast  Result Date: 08/28/2019 CLINICAL DATA:  Follow-up B-cell lymphoma after 2 cycles of induction chemo-immunotherapy EXAM: CT CHEST, ABDOMEN, AND PELVIS WITH CONTRAST TECHNIQUE: Multidetector CT imaging of the chest, abdomen and pelvis was performed following the standard protocol during bolus administration of intravenous contrast. CONTRAST:  149mL OMNIPAQUE IOHEXOL 300 MG/ML  SOLN COMPARISON:  PET-CT dated 05/15/2019 FINDINGS: CT CHEST FINDINGS Cardiovascular: The heart is normal in size. No pericardial effusion. No evidence of thoracic aortic aneurysm. Mild atherosclerotic calcifications of the aortic arch. Mediastinum/Nodes: Calcified mediastinal and bilateral perihilar nodes. Dominant 10 mm short axis right paratracheal node, unchanged. Mildly prominent bilateral axillary nodes, measuring up to 10 mm short axis, previously 2.2 cm. Visualized thyroid is unremarkable. Lungs/Pleura: Prior central left upper lobe/suprahilar nodule has resolved. Calcifications with scarring in the  left perihilar region. Patchy opacity in the medial right lower lobe, measuring 1.5 x 7.4 cm (series 4/image 104), new. Patchy/nodular opacity inferiorly in the right middle lobe measuring up to 13 mm (series 4/image 117), new. No pleural effusion or pneumothorax. Musculoskeletal: Degenerative changes of the thoracic spine. CT ABDOMEN PELVIS FINDINGS Hepatobiliary: Liver is within normal limits. Gallbladder is unremarkable. No intrahepatic or extrahepatic ductal dilatation. Pancreas: Within normal limits. Spleen: Spleen is normal in size. Adrenals/Urinary Tract: Adrenal glands are within normal  limits. Small bilateral renal cysts, measuring up to 1.6 cm in the posterior right lower kidney. No hydronephrosis. Mildly thick-walled bladder, although underdistended. Stomach/Bowel: Stomach is notable for a tiny hiatal hernia. No evidence of bowel obstruction. Normal appendix (series 2/image 83). Vascular/Lymphatic: No evidence of abdominal aortic aneurysm. Mild atherosclerotic calcifications the bilateral common iliac arteries. Small para-aortic nodes, measuring up to 12 mm short axis on the right. Dominant 1.8 cm short axis right common iliac node (series 2/image 39), previously 4.0 cm. 11 mm short axis right external iliac node (series 2/image 100), previously 16 mm. Reproductive: Prostate is grossly unremarkable. Other: No abdominopelvic ascites. Musculoskeletal: Lumbar spine is unremarkable. IMPRESSION: Improving lymphadenopathy in the chest, abdomen, and pelvis. Dominant axillary nodes measure up to 10 mm, previously 2.2 cm. Dominant right common iliac node measures 1.8 cm, previously 4.0 cm. Prior left upper lobe/suprahilar nodular opacity has resolved. New patchy/nodular opacities in the right middle and lower lobes. While lymphomatous involvement is possible, the appearance favors infection/inflammation. Spleen is normal in size. Electronically Signed   By: Julian Hy M.D.   On: 08/28/2019 16:02   MR  BRAIN WO CONTRAST  Result Date: 08/29/2019 CLINICAL DATA:  Awoke this morning with gait disturbance and slurred speech. EXAM: MRI HEAD WITHOUT CONTRAST TECHNIQUE: Multiplanar, multiecho pulse sequences of the brain and surrounding structures were obtained without intravenous contrast. COMPARISON:  Head CT same day. FINDINGS: Brain: Acute infarction affecting the right para median pons. No other acute brain insult. The brain is otherwise normal without atrophy, old or acute infarction elsewhere, mass lesion, hemorrhage, hydrocephalus or extra-axial collection. Axial T2 imaging was omitted. Vascular: Major vessels at the base of the brain show flow. Skull and upper cervical spine: Negative Sinuses/Orbits: Clear Other: None IMPRESSION: Acute infarction of the right para median pons. Otherwise normal brain MRI. Electronically Signed   By: Nelson Chimes M.D.   On: 08/29/2019 21:11   CT Abdomen Pelvis W Contrast  Result Date: 08/28/2019 CLINICAL DATA:  Follow-up B-cell lymphoma after 2 cycles of induction chemo-immunotherapy EXAM: CT CHEST, ABDOMEN, AND PELVIS WITH CONTRAST TECHNIQUE: Multidetector CT imaging of the chest, abdomen and pelvis was performed following the standard protocol during bolus administration of intravenous contrast. CONTRAST:  168mL OMNIPAQUE IOHEXOL 300 MG/ML  SOLN COMPARISON:  PET-CT dated 05/15/2019 FINDINGS: CT CHEST FINDINGS Cardiovascular: The heart is normal in size. No pericardial effusion. No evidence of thoracic aortic aneurysm. Mild atherosclerotic calcifications of the aortic arch. Mediastinum/Nodes: Calcified mediastinal and bilateral perihilar nodes. Dominant 10 mm short axis right paratracheal node, unchanged. Mildly prominent bilateral axillary nodes, measuring up to 10 mm short axis, previously 2.2 cm. Visualized thyroid is unremarkable. Lungs/Pleura: Prior central left upper lobe/suprahilar nodule has resolved. Calcifications with scarring in the left perihilar region. Patchy  opacity in the medial right lower lobe, measuring 1.5 x 7.4 cm (series 4/image 104), new. Patchy/nodular opacity inferiorly in the right middle lobe measuring up to 13 mm (series 4/image 117), new. No pleural effusion or pneumothorax. Musculoskeletal: Degenerative changes of the thoracic spine. CT ABDOMEN PELVIS FINDINGS Hepatobiliary: Liver is within normal limits. Gallbladder is unremarkable. No intrahepatic or extrahepatic ductal dilatation. Pancreas: Within normal limits. Spleen: Spleen is normal in size. Adrenals/Urinary Tract: Adrenal glands are within normal limits. Small bilateral renal cysts, measuring up to 1.6 cm in the posterior right lower kidney. No hydronephrosis. Mildly thick-walled bladder, although underdistended. Stomach/Bowel: Stomach is notable for a tiny hiatal hernia. No evidence of bowel obstruction. Normal appendix (series 2/image 83). Vascular/Lymphatic: No  evidence of abdominal aortic aneurysm. Mild atherosclerotic calcifications the bilateral common iliac arteries. Small para-aortic nodes, measuring up to 12 mm short axis on the right. Dominant 1.8 cm short axis right common iliac node (series 2/image 39), previously 4.0 cm. 11 mm short axis right external iliac node (series 2/image 100), previously 16 mm. Reproductive: Prostate is grossly unremarkable. Other: No abdominopelvic ascites. Musculoskeletal: Lumbar spine is unremarkable. IMPRESSION: Improving lymphadenopathy in the chest, abdomen, and pelvis. Dominant axillary nodes measure up to 10 mm, previously 2.2 cm. Dominant right common iliac node measures 1.8 cm, previously 4.0 cm. Prior left upper lobe/suprahilar nodular opacity has resolved. New patchy/nodular opacities in the right middle and lower lobes. While lymphomatous involvement is possible, the appearance favors infection/inflammation. Spleen is normal in size. Electronically Signed   By: Julian Hy M.D.   On: 08/28/2019 16:02    EKG: Independently  reviewed.  Assessment/Plan Principal Problem:   Acute ischemic stroke (HCC) Active Problems:   CLL (chronic lymphocytic leukemia) (HCC)   Hyponatremia   HTN (hypertension)   DM2 (diabetes mellitus, type 2) ([place])    1. Acute ischemic stroke - 1. Neuro consult 2. Stroke pathway 3. 2d echo 4. Carotid dopplers 5. PT/OT/SLP 6. ASA 325 7. A1C, FLP 8. Neuro checks 2. Hyponatremia - 1. Mild, looks to be chronic and baseline 3. CLL / small B cell lymphoma - 1. Looks like last time he had chemo was in Oct 2. Though lymphoma symptoms improved then, didn't sound like it was in remission per Dr. Grier Mitts note 3. Supposed to have CT chest/abd/pelvis this month for re-staging it looks like per that Nov note. 4. DM2 - 1. Hold metformin 2. Sensitive SSI q4h 5. HTN - 1. Holding home BP meds and allowing permissive HTN.  DVT prophylaxis: Lovenox Code Status: Full Family Communication: No family in room Disposition Plan: Home after admit Consults called: Neuro Admission status: Admit to inpatient  Severity of Illness: The appropriate patient status for this patient is INPATIENT. Inpatient status is judged to be reasonable and necessary in order to provide the required intensity of service to ensure the patient's safety. The patient's presenting symptoms, physical exam findings, and initial radiographic and laboratory data in the context of their chronic comorbidities is felt to place them at high risk for further clinical deterioration. Furthermore, it is not anticipated that the patient will be medically stable for discharge from the hospital within 2 midnights of admission. The following factors support the patient status of inpatient.   IP status due to acute ischemic stroke.   * I certify that at the point of admission it is my clinical judgment that the patient will require inpatient hospital care spanning beyond 2 midnights from the point of admission due to high intensity of service,  high risk for further deterioration and high frequency of surveillance required.*    Ricco Dershem M. DO [facility]  How to contact the [facility] Attending or Consulting provider [place] or covering provider during after hours [place], for this patient?  1. Check the care team in [facility] and look for a) attending/consulting [facility] provider listed and b) the [facility] team listed 2. Log into [email or web address]  Amion Physician Scheduling and messaging for groups and whole hospitals  On call and physician scheduling software for group practices, residents, hospitalists and other medical providers for call, clinic, rotation and shift schedules. OnCall [employer] is a hospital-wide system for scheduling doctors and paging doctors on call. EasyPlot is for scientific  plotting and data analysis.  [email or web address]  and use [place]'s universal password to access. If you do not have the password, please contact the hospital operator.  3. Locate the [facility] provider you are looking for under [facility] and page to a number that you can be directly reached. 4. If you still have difficulty reaching the provider, please page the [facility] (Director on Call) for the Hospitalists listed on amion for assistance.  08/29/2019, 10:32 PM

## 2019-08-29 NOTE — ED Triage Notes (Signed)
Pt arrives via [facility] from home. Per EMS report, Pt went to bed around 1930 last night. Woke up around 1030 this morning with staggering gate and slurred speech. Around 1530, knocked over a TV onto him. TV was removed, pt was then unable to walk. He has intermittent garbled speech, left upper and lower extremity weakness with drift. No visual changes. IV established in the left AC. 145/80, hr 50-60 cbg 126 en route.

## 2019-08-29 NOTE — ED Provider Notes (Signed)
[facility] EMERGENCY DEPARTMENT Provider Note   CSN: [id number] Arrival date & time: 08/29/19  1910     History Chief Complaint  Patient presents with  . Weakness    [name] [name] is a 64 y.o. male.  HPI    Patient presents with concern of new weakness. Patient describes generalized weakness, but with additional questioning notes that since yesterday he has had weakness in his left side, upper and lower extremities. No clear precipitant.  Patient is also unclear on if his symptoms have changed throughout the past day.  He states that he has multiple medical issues, but was well prior to the onset of this weakness yesterday. He denies pain, denies confusion, denies fever, denies dyspnea. He arrives via EMS providers who notes that the patient had left-sided weakness in the upper and lower extremity in transport. Past Medical History:  Diagnosis Date  . Allergic rhinitis, cause unspecified   . Backache, unspecified   . Body mass index 33.0-33.9, adult   . Diabetes mellitus without complication ([place])    Type II  . Elevated blood pressure reading without diagnosis of hypertension   . Esophageal reflux   . Generalized pain   . Heartburn   . Insomnia, unspecified   . 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 Other abnormal blood chemistry   . Other and unspecified hyperlipidemia   . Other dyspnea and respiratory abnormality   . Other nonspecific findings on examination of blood(790.99)   . Pain in joint, lower leg   . Pneumonia    1968  . Sleep apnea    Does not use or own a CPAP  . Tobacco use disorder   . Unspecified essential hypertension     Patient Active Problem List   Diagnosis Date Noted  . CLL (chronic lymphocytic leukemia) ([place]) 04/10/2019  . Counseling regarding advance care planning and goals of care 04/10/2019  . Deviated septum 05/18/2018  . Iron deficiency anemia 02/26/2018    Past Surgical  History:  Procedure Laterality Date  . bil wrist surgery    . ENDOSCOPIC CONCHA BULLOSA RESECTION Bilateral 05/18/2018   Procedure: ENDOSCOPIC CONCHA BULLOSA RESECTION;  Surgeon: Jerrell Belfast, MD;  Location: [facility];  Service: ENT;  Laterality: Bilateral;  . NASAL SEPTOPLASTY W/ TURBINOPLASTY Bilateral 05/18/2018   Procedure: NASAL SEPTOPLASTY WITH TURBINATE REDUCTION;  Surgeon: Jerrell Belfast, MD;  Location: [place];  Service: ENT;  Laterality: Bilateral;  . ORIF FOREARM FRACTURE     right  . plastic surgery to face    . SINUS ENDO W/FUSION Bilateral 05/18/2018   Procedure: ENDOSCOPIC SINUS SURGERY WITH NAVIGATION;  Surgeon: Jerrell Belfast, MD;  Location: [facility] OR;  Service: ENT;  Laterality: Bilateral;       Family History  Problem Relation Age of Onset  . Diabetes Mother   . Hypertension Mother   . Cancer Paternal Uncle     Social History   Tobacco Use  . Smoking status: Former Smoker    Packs/day: 0.20    Years: 20.00    Pack years: 4.00    Types: Cigars    Quit date: 2014    Years since quitting: 7.0  . Smokeless tobacco: Never Used  Substance Use Topics  . Alcohol use: Yes    Comment: 2 40oz beers a week  . Drug use: No    Home Medications Prior to Admission medications   Medication Sig Start Date End Date Taking? Authorizing Provider  acyclovir (ZOVIRAX) 400 MG tablet Take 1 tablet (400 mg total) by mouth daily. 04/25/19   Brunetta Genera, MD  allopurinol (ZYLOPRIM) 300 MG tablet Take 0.5 tablets (150 mg total) by mouth daily. 04/25/19   Brunetta Genera, MD  amLODipine (NORVASC) 10 MG tablet Take 10 mg by mouth daily.    [provider]  atorvastatin (LIPITOR) 20 MG tablet Take 20 mg by mouth daily.    [provider]  cyclobenzaprine (FLEXERIL) 10 MG tablet Take 10 mg by mouth at bedtime.    [provider]  dexamethasone (DECADRON) 4 MG tablet Take 2 tablets (8 mg total) by mouth daily. Start the day after bendamustine  chemotherapy for 2 days. Take with food. 04/25/19   Brunetta Genera, MD  losartan (COZAAR) 50 MG tablet Take 50 mg by mouth daily.    [provider]  meloxicam (MOBIC) 15 MG tablet Take 15 mg by mouth daily.    [provider]  metFORMIN (GLUCOPHAGE) 500 MG tablet Take 500 mg by mouth daily.    [provider]  ondansetron (ZOFRAN) 8 MG tablet Take 1 tablet (8 mg total) by mouth 2 (two) times daily as needed for refractory nausea / vomiting. Start on day 2 after bendamustine chemotherapy. 04/25/19   Brunetta Genera, MD  oxyCODONE-acetaminophen (PERCOCET/ROXICET) 5-325 MG tablet Take 1 tablet by mouth 3 (three) times daily as needed for pain. 04/21/18   [provider]  pantoprazole (PROTONIX) 40 MG tablet Take 40 mg by mouth at bedtime.     [provider]  potassium chloride SA (K-DUR,KLOR-CON) 20 MEQ tablet Take 20 mEq by mouth daily.    [provider]  prochlorperazine (COMPAZINE) 10 MG tablet Take 1 tablet (10 mg total) by mouth every 6 (six) hours as needed (Nausea or vomiting). 04/25/19   Brunetta Genera, MD  zolpidem (AMBIEN) 10 MG tablet Take 10 mg by mouth at bedtime.     [provider]    Allergies    Lisinopril and Tuberculin tests  Review of Systems   Review of Systems  Constitutional:       Per HPI, otherwise negative  HENT:       Per HPI, otherwise negative  Respiratory:       Per HPI, otherwise negative  Cardiovascular:       Per HPI, otherwise negative  Gastrointestinal: Negative for vomiting.  Endocrine:       Negative aside from HPI  Genitourinary:       Neg aside from HPI   Musculoskeletal:       Per HPI, otherwise negative  Skin: Negative.   Neurological: Positive for weakness. Negative for syncope.    Physical Exam Updated Vital Signs BP 135/69 (BP Location: Right Arm)   Pulse 78   Temp 98 F (36.7 C) (Oral)   Resp 16   Ht 5\' 7"  (1.702 m)   Wt 68 kg   SpO2 98%   BMI 23.49 kg/m    Physical Exam Vitals and nursing note reviewed.  Constitutional:      General: He is not in acute distress.    Appearance: He is well-developed.  HENT:     Head: Normocephalic and atraumatic.  Eyes:     Conjunctiva/sclera: Conjunctivae normal.  Cardiovascular:     Rate and Rhythm: Normal rate and regular rhythm.  Pulmonary:     Effort: Pulmonary effort is normal. No respiratory distress.     Breath sounds: No stridor.  Abdominal:     General: There is no distension.  Skin:    General: Skin is warm and dry.  Neurological:     Mental Status: He is alert and oriented to person, place, and time.     Cranial Nerves: Dysarthria present.     Motor: Abnormal muscle tone present.     Comments: Strength grossly symmetric, left diminished compared to right, and grip strength, and gross motor strength upper and lower extremities 3/5, compared to 5/5 on R.  Psychiatric:        Behavior: Behavior is slowed and withdrawn.     ED Results / Procedures / Treatments   Labs (all labs ordered are listed, but only abnormal results are displayed) Labs Reviewed  CBC - Abnormal; Notable for the following components:      Result Value   RBC 3.94 (*)    Hemoglobin 9.8 (*)    HCT 28.8 (*)    MCV 73.1 (*)    MCH 24.9 (*)    RDW 17.3 (*)    All other components within normal limits  DIFFERENTIAL - Abnormal; Notable for the following components:   Lymphs Abs 0.2 (*)    All other components within normal limits  COMPREHENSIVE METABOLIC PANEL - Abnormal; Notable for the following components:   Sodium 128 (*)    Chloride 94 (*)    Glucose, Bld 115 (*)    Total Bilirubin 1.3 (*)    All other components within normal limits  I-STAT CHEM 8, ED - Abnormal; Notable for the following components:   Sodium 128 (*)    Chloride 94 (*)    Glucose, Bld 111 (*)    Hemoglobin 9.9 (*)    HCT 29.0 (*)    All other components within normal limits  I-STAT CHEM 8, ED - Abnormal; Notable for the following  components:   Sodium 126 (*)    Potassium 5.4 (*)    Chloride 95 (*)    Glucose, Bld 108 (*)    Calcium, Ion 1.12 (*)    Hemoglobin 10.2 (*)    HCT 30.0 (*)    All other components within normal limits  SARS CORONAVIRUS 2 (TAT 6-24 HRS)  PROTIME-INR  APTT  RAPID URINE DRUG SCREEN, HOSP PERFORMED  URINALYSIS, ROUTINE W REFLEX MICROSCOPIC  POC SARS CORONAVIRUS 2 AG -  ED    EKG EKG Interpretation  Date/Time:  Thursday August 29 2019 19:20:28 EST Ventricular Rate:  66 PR Interval:    QRS Duration: 93 QT Interval:  432 QTC Calculation: 453 R Axis:   37 Text Interpretation: Sinus rhythm Supraventricular bigeminy Abnormal R-wave progression, early transition Baseline wander in lead(s) V3 Artifact Abnormal ECG Confirmed by Carmin Muskrat [phone]) on 08/29/2019 7:25:02 PM   Radiology CT HEAD WO CONTRAST  Result Date: 08/29/2019 CLINICAL DATA:  64 year old male with focal neurologic deficit. EXAM: CT HEAD WITHOUT CONTRAST TECHNIQUE: Contiguous axial images were obtained from the base of the skull through the vertex without intravenous contrast. COMPARISON:  None. FINDINGS: Brain: The ventricles and sulci appropriate size for patient's age. The gray-white matter discrimination is preserved. There is no acute intracranial hemorrhage. No mass effect or midline shift. No extra-axial fluid collection. Vascular: No hyperdense vessel or unexpected calcification. Skull: Normal. Negative for fracture or focal lesion. Sinuses/Orbits: Mild mucoperiosteal thickening of paranasal sinuses. The mastoid air cells are clear. Other: Prior fixation of left zygomatic fracture. IMPRESSION: Unremarkable noncontrast CT of the brain. Electronically Signed   By: Milas Hock  Radparvar M.D.   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 CONTRAST:  131mL OMNIPAQUE IOHEXOL 300 MG/ML  SOLN COMPARISON:  PET-CT dated 05/15/2019 FINDINGS: CT CHEST FINDINGS Cardiovascular: The heart is normal in size. No pericardial effusion. No evidence of thoracic aortic aneurysm. Mild atherosclerotic calcifications of the aortic arch. Mediastinum/Nodes: Calcified mediastinal and bilateral perihilar nodes. Dominant 10 mm short axis right paratracheal node, unchanged. Mildly prominent bilateral axillary nodes, measuring up to 10 mm short axis, previously 2.2 cm. Visualized thyroid is unremarkable. Lungs/Pleura: Prior central left upper lobe/suprahilar nodule has resolved. Calcifications with scarring in the left perihilar region. Patchy opacity in the medial right lower lobe, measuring 1.5 x 7.4 cm (series 4/image 104), new. Patchy/nodular opacity inferiorly in the right middle lobe measuring up to 13 mm (series 4/image 117), new. No pleural effusion or pneumothorax. Musculoskeletal: Degenerative changes of the thoracic spine. CT ABDOMEN PELVIS FINDINGS Hepatobiliary: Liver is within normal limits. Gallbladder is unremarkable. No intrahepatic or extrahepatic ductal dilatation. Pancreas: Within normal limits. Spleen: Spleen is normal in size. Adrenals/Urinary Tract: Adrenal glands are within normal limits. Small bilateral renal cysts, measuring up to 1.6 cm in the posterior right lower kidney. No hydronephrosis. Mildly thick-walled bladder, although underdistended. Stomach/Bowel: Stomach is notable for a tiny hiatal hernia. No evidence of bowel obstruction. Normal appendix (series 2/image 83). Vascular/Lymphatic: No evidence of abdominal aortic aneurysm. Mild atherosclerotic calcifications the bilateral common iliac arteries. Small para-aortic nodes, measuring up to 12 mm short axis on the right. Dominant 1.8 cm short axis right  common iliac node (series 2/image 39), previously 4.0 cm. 11 mm short axis right external iliac node (series 2/image 100), previously 16 mm. Reproductive: Prostate is grossly unremarkable. Other: No abdominopelvic ascites. Musculoskeletal: Lumbar spine is unremarkable. IMPRESSION: Improving lymphadenopathy in the chest, abdomen, and pelvis. Dominant axillary nodes measure up to 10 mm, previously 2.2 cm. Dominant right common iliac node measures 1.8 cm, previously 4.0 cm. Prior left upper lobe/suprahilar nodular opacity has resolved. New patchy/nodular opacities in the right middle and lower lobes. While lymphomatous involvement is possible, the appearance favors infection/inflammation. Spleen is normal in size. Electronically Signed   By: Julian Hy M.D.   On: 08/28/2019 16:02   MR BRAIN WO CONTRAST  Result Date: 08/29/2019 CLINICAL DATA:  Awoke this morning with gait disturbance and slurred speech. EXAM: MRI HEAD WITHOUT CONTRAST TECHNIQUE: Multiplanar, multiecho pulse sequences of the brain and surrounding structures were obtained without intravenous contrast. COMPARISON:  Head CT same day. FINDINGS: Brain: Acute infarction affecting the right para median pons. No other acute brain insult. The brain is otherwise normal without atrophy, old or acute infarction elsewhere, mass lesion, hemorrhage, hydrocephalus or extra-axial collection. Axial T2 imaging was omitted. Vascular: Major vessels at the base of the brain show flow. Skull and upper cervical spine: Negative Sinuses/Orbits: Clear Other: None IMPRESSION: Acute infarction of the right para median pons. Otherwise normal brain MRI. Electronically Signed   By: Nelson Chimes M.D.   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 CONTRAST:  134mL OMNIPAQUE IOHEXOL 300 MG/ML  SOLN COMPARISON:  PET-CT dated 05/15/2019 FINDINGS: CT CHEST FINDINGS Cardiovascular: The heart is normal in size. No pericardial effusion. No evidence of thoracic aortic aneurysm. Mild atherosclerotic calcifications of the aortic arch. Mediastinum/Nodes: Calcified mediastinal and bilateral perihilar nodes. Dominant 10 mm short axis right paratracheal node, unchanged. Mildly prominent bilateral axillary nodes, measuring up to 10 mm short axis, previously 2.2 cm. Visualized thyroid is unremarkable. Lungs/Pleura: Prior central left upper lobe/suprahilar nodule has resolved. Calcifications with scarring in the left perihilar region. Patchy opacity in the medial right lower lobe, measuring 1.5 x 7.4 cm (series 4/image 104), new. Patchy/nodular opacity inferiorly in the right middle lobe measuring up to 13 mm (series 4/image 117), new. No pleural effusion or pneumothorax. Musculoskeletal: Degenerative changes of the thoracic spine. CT ABDOMEN PELVIS FINDINGS Hepatobiliary: Liver is within normal limits. Gallbladder is unremarkable. No intrahepatic or extrahepatic ductal dilatation. Pancreas: Within normal limits. Spleen: Spleen is normal in size. Adrenals/Urinary Tract: Adrenal glands are within normal limits. Small bilateral renal cysts, measuring up to 1.6 cm in the posterior right lower kidney. No hydronephrosis. Mildly thick-walled bladder, although underdistended. Stomach/Bowel: Stomach is notable for a tiny hiatal hernia. No evidence of bowel obstruction. Normal appendix (series 2/image 83). Vascular/Lymphatic: No evidence of abdominal aortic aneurysm. Mild atherosclerotic calcifications the bilateral common iliac arteries. Small para-aortic nodes, measuring up to 12 mm short axis on the right. Dominant 1.8 cm short axis right common iliac node (series 2/image  39), previously 4.0 cm. 11 mm short axis right external iliac node (series 2/image 100), previously 16 mm. Reproductive: Prostate is grossly unremarkable. Other: No abdominopelvic ascites. Musculoskeletal: Lumbar spine is unremarkable. IMPRESSION: Improving lymphadenopathy in the chest, abdomen, and pelvis. Dominant axillary nodes measure up to 10 mm, previously 2.2 cm. Dominant right common iliac node measures 1.8 cm, previously 4.0 cm. Prior left upper lobe/suprahilar nodular opacity has resolved. New patchy/nodular opacities in the right middle and lower lobes. While lymphomatous involvement is possible, the appearance favors infection/inflammation. Spleen is normal in size. Electronically Signed   By: Julian Hy M.D.   On: 08/28/2019 16:02    Procedures Procedures (including critical care time)  Medications Ordered in ED Medications  sodium chloride 0.9 % bolus 500 mL (500 mLs Intravenous New Bag/Given 08/29/19 1932)    Followed by  0.9 %  sodium chloride infusion (has no administration in time range)    ED Course  I have reviewed the triage vital signs and the nursing notes.  Pertinent labs & imaging results that were available during my care of the patient were reviewed by me and considered in my medical decision making (see chart for details).  Chart review after the initial evaluation notable for multiple medical issues including CLL.  Patient had CT imaging of his chest, abdomen, pelvis yesterday, pertinent results as below: IMPRESSION: Improving lymphadenopathy in the chest, abdomen, and pelvis. Dominant axillary nodes measure up to 10 mm, previously 2.2 cm. Dominant right common iliac node measures 1.8 cm, previously 4.0 cm.   Prior left upper lobe/suprahilar nodular opacity has resolved. New patchy/nodular opacities in the right middle and lower lobes. While lymphomatous involvement is possible, the appearance favors infection/inflammation.   Spleen is normal in size.       Electronically Signed   By: Julian Hy M.D.   On: 08/28/2019 16:02   update: MRI c/w stroke  MDM Rules/Calculators/A&P                      This patient with CLL now presents with new left-sided weakness. Patient is awake and alert, but has clinical deficiencies in the left upper and lower extremity as well as dysarthria. Last seen normal was at least 24 hours ago, patient is not a candidate for emergent interventions, but with findings concerning for new stroke, he will require admission for further monitoring, management. Final Clinical Impression(s) / ED Diagnoses Final diagnoses:  Right pontine stroke [facility])     Carmin Muskrat, MD 08/29/19 2116

## 2019-08-29 NOTE — ED Notes (Signed)
Patient aware that we need urine sample for testing, unable at this time. Pt given instruction on providing urine sample when able to do so.   

## 2019-08-30 ENCOUNTER — Inpatient Hospital Stay (HOSPITAL_COMMUNITY): Payer: Medicare Other

## 2019-08-30 DIAGNOSIS — I10 Essential (primary) hypertension: Secondary | ICD-10-CM

## 2019-08-30 DIAGNOSIS — C9110 Chronic lymphocytic leukemia of B-cell type not having achieved remission: Secondary | ICD-10-CM

## 2019-08-30 DIAGNOSIS — E871 Hypo-osmolality and hyponatremia: Secondary | ICD-10-CM

## 2019-08-30 DIAGNOSIS — I361 Nonrheumatic tricuspid (valve) insufficiency: Secondary | ICD-10-CM

## 2019-08-30 DIAGNOSIS — E119 Type 2 diabetes mellitus without complications: Secondary | ICD-10-CM

## 2019-08-30 LAB — LIPID PANEL
Cholesterol: 131 mg/dL (ref 0–200)
HDL: 36 mg/dL — ABNORMAL LOW (ref >40)
LDL Cholesterol: 85 mg/dL (ref 0–99)
Total CHOL/HDL Ratio: 3.6 RATIO
Triglycerides: 52 mg/dL (ref <150)
VLDL: 10 mg/dL (ref 0–40)

## 2019-08-30 LAB — GLUCOSE, CAPILLARY: Glucose-Capillary: 96 mg/dL (ref 70–99)

## 2019-08-30 LAB — ECHOCARDIOGRAM COMPLETE
Height: 67 in
Weight: 2400 oz

## 2019-08-30 LAB — CBG MONITORING, ED
Glucose-Capillary: 126 mg/dL — ABNORMAL HIGH (ref 70–99)
Glucose-Capillary: 134 mg/dL — ABNORMAL HIGH (ref 70–99)

## 2019-08-30 LAB — HIV ANTIBODY (ROUTINE TESTING W REFLEX): HIV Screen 4th Generation wRfx: NONREACTIVE

## 2019-08-30 LAB — SARS CORONAVIRUS 2 (TAT 6-24 HRS): SARS Coronavirus 2: NEGATIVE

## 2019-08-30 MED ORDER — IOHEXOL 350 MG/ML SOLN
100.0000 mL | Freq: Once | INTRAVENOUS | Status: AC | PRN
  Administered 2019-08-30: 100 mL via INTRAVENOUS

## 2019-08-30 MED ORDER — STUDY - PACIFIC (STROKE) - BAY 2433334 (BLUE BOTTLE) 5, 15, 25MG OR PLACEBO TABLET (PI-SETHI)
1.0000 | ORAL_TABLET | Freq: Every day | ORAL | Status: DC
  Administered 2019-08-30 – 2019-09-03 (×5): 1 via ORAL
  Filled 2019-08-30 (×5): qty 1

## 2019-08-30 MED ORDER — STUDY - PACIFIC (STROKE) - BAY 2433334 (GREEN BOTTLE) 5, 15, 25MG OR PLACEBO TABLET (PI-SETHI)
1.0000 | ORAL_TABLET | Freq: Every day | ORAL | Status: DC
  Administered 2019-08-30 – 2019-09-03 (×5): 1 via ORAL
  Filled 2019-08-30 (×5): qty 1

## 2019-08-30 MED ORDER — CLOPIDOGREL BISULFATE 75 MG PO TABS
75.0000 mg | ORAL_TABLET | Freq: Every day | ORAL | Status: DC
  Administered 2019-08-30 – 2019-09-03 (×5): 75 mg via ORAL
  Filled 2019-08-30 (×5): qty 1

## 2019-08-30 NOTE — Progress Notes (Signed)
PROGRESS NOTE    [name] KREGEL  [id number] DOB: 01/01/1956 DOA: 08/29/2019 PCP: Bernerd Limbo, MD   Brief Narrative:  HPI on 08/29/2019 by Dr. Jennette Kettle [name] [name] is a 64 y.o. male with medical history significant of CLL / small B cell lymphoma, DM2, HTN. Patient with L sided weakness in upper and lower extremities onset yesterday.  Unclear if patients symptoms changed throughout day, but they are persistent. No confusion, no fever, no SOB. Assessment & Plan   Acute ischemic CVA -CT head was unremarkable -MRI brain: Acute infarction of the right paramedian pons -Echocardiogram pending -LDL 85 hemoglobin A1c 5.9 -PT recommended CIR -Inpatient rehab consulted and appreciated -Neurology consulted and appreciated, pending recommendations -Continue statin, aspirin  Hyponatremia, chronic -Review of chart shows that patient did have a low sodium in Sep through November 2020 -sodium 126 -Continue IV fluids -Monitor BMP  Hyperkalemia -Will continue to monitor BMP  CLL/small B-cell lymphoma -Last chemotherapy appears to occurred in October 2020 -Patient sees Dr. Claiborne Billings  Diabetes mellitus, type II -Metfomin held -A1c 5.9 -Continue insulin sliding scale CBG monitoring  Essential Hypertension -Allow for permissive hypertension given acute CVA  DVT Prophylaxis Lovenox  Code Status: Full  Family Communication: None at bedside  Disposition Plan: Admitted.  Pending neurology recommendations, CVA work-up.  Dipso TBD  Consultants Neurology Inpatient rehab  Procedures  Echocardiogram  Antibiotics   Anti-infectives (From admission, onward)   None      Subjective:   [name] [name] seen and examined today.  Patient continues to complain of not being will move his left side.  Denies current chest pain, shortness of breath, abdominal pain, nausea or vomiting, diarrhea or constipation, dizziness or headache  Objective:   Vitals:   08/30/19 1101 08/30/19 1200  08/30/19 1300 08/30/19 1400  BP: 135/88 122/65 120/77 119/68  Pulse: (!) 48 (!) 49 70 67  Resp: (!) 21 (!) 25 (!) 23 17  Temp:      TempSrc:      SpO2: 97% 99% 99% 100%  Weight:      Height:       No intake or output data in the 24 hours ending 08/30/19 1506 Filed Weights   08/29/19 1923  Weight: 68 kg    Exam  General: Well developed, well nourished, NAD, appears stated age  HEENT: NCAT, mucous membranes moist.   Cardiovascular: S1 S2 auscultated, RRR  Respiratory: Clear to auscultation bilaterally with equal chest rise  Abdomen: Soft, nontender, nondistended, + bowel sounds  Extremities: warm dry without cyanosis clubbing or edema  Neuro: AAOx3, left-sided weakness, arm 3/5, leg 2/5.  Otherwise nonfocal  Psych: Normal affect and demeanor with intact judgement and insight   Data Reviewed: I have personally reviewed following labs and imaging studies  CBC: Recent Labs  Lab 08/27/19 0845 08/29/19 1931 08/29/19 1947 08/29/19 2004  WBC 4.1 4.3  --   --   NEUTROABS 2.6 3.2  --   --   HGB 9.7* 9.8* 9.9* 10.2*  HCT 28.8* 28.8* 29.0* 30.0*  MCV 71.5* 73.1*  --   --   PLT 313 310  --   --    Basic Metabolic Panel: Recent Labs  Lab 08/27/19 0845 08/29/19 1931 08/29/19 1947 08/29/19 2004  NA 129* 128* 128* 126*  K 3.6 4.1 4.0 5.4*  CL 96* 94* 94* 95*  CO2 24 23  --   --   GLUCOSE 169* 115* 111* 108*  BUN 10 8 9  11  CREATININE 0.97 0.86 0.80 0.80  CALCIUM 9.4 9.7  --   --   MG 1.8  --   --   --    GFR: Estimated Creatinine Clearance: 89.5 mL/min (by C-G formula based on SCr of 0.8 mg/dL). Liver Function Tests: Recent Labs  Lab 08/27/19 0845 08/29/19 1931  AST 14* 18  ALT 8 8  ALKPHOS 59 61  BILITOT 1.7* 1.3*  PROT 7.0 7.0  ALBUMIN 3.9 4.0   No results for input(s): LIPASE, AMYLASE in the last 168 hours. No results for input(s): AMMONIA in the last 168 hours. Coagulation Profile: Recent Labs  Lab 08/29/19 1931  INR 1.0   Cardiac  Enzymes: No results for input(s): CKTOTAL, CKMB, CKMBINDEX, TROPONINI in the last 168 hours. BNP (last 3 results) No results for input(s): PROBNP in the last 8760 hours. HbA1C: Recent Labs    08/29/19 1931  HGBA1C 5.9*   CBG: Recent Labs  Lab 08/29/19 2356 08/30/19 0750 08/30/19 1115  GLUCAP 101* 126* 134*   Lipid Profile: Recent Labs    08/30/19 0225  CHOL 131  HDL 36*  LDLCALC 85  TRIG 52  CHOLHDL 3.6   Thyroid Function Tests: No results for input(s): TSH, T4TOTAL, FREET4, T3FREE, THYROIDAB in the last 72 hours. Anemia Panel: No results for input(s): VITAMINB12, FOLATE, FERRITIN, TIBC, IRON, RETICCTPCT in the last 72 hours. Urine analysis:    Component Value Date/Time   COLORURINE YELLOW 08/29/2019 2118   APPEARANCEUR CLEAR 08/29/2019 2118   LABSPEC 1.014 08/29/2019 2118   PHURINE 6.0 08/29/2019 2118   GLUCOSEU NEGATIVE 08/29/2019 2118   HGBUR NEGATIVE 08/29/2019 2118   BILIRUBINUR NEGATIVE 08/29/2019 2118   [place] NEGATIVE 08/29/2019 2118   PROTEINUR NEGATIVE 08/29/2019 2118   NITRITE NEGATIVE 08/29/2019 2118   LEUKOCYTESUR NEGATIVE 08/29/2019 2118   Sepsis Labs: @LABRCNTIP (procalcitonin:4,lacticidven:4)  ) Recent Results (from the past 240 hour(s))  SARS CORONAVIRUS 2 (TAT 6-24 HRS) Nasopharyngeal Nasopharyngeal Swab     Status: None   Collection Time: 08/29/19  9:18 PM   Specimen: Nasopharyngeal Swab  Result Value Ref Range Status   SARS Coronavirus 2 NEGATIVE NEGATIVE Final    Comment: (NOTE) SARS-CoV-2 target nucleic acids are NOT DETECTED. The SARS-CoV-2 RNA is generally detectable in upper and lower respiratory specimens during the acute phase of infection. Negative results do not preclude SARS-CoV-2 infection, do not rule out co-infections with other pathogens, and should not be used as the sole basis for treatment or other patient management decisions. Negative results must be combined with clinical observations, patient history, and  epidemiological information. The expected result is Negative. Fact Sheet for Patients: SugarRoll.be Fact Sheet for Healthcare Providers: https://www.woods-mathews.com/ This test is not yet approved or cleared by the [place] FDA and  has been authorized for detection and/or diagnosis of SARS-CoV-2 by FDA under an Emergency Use Authorization (EUA). This EUA will remain  in effect (meaning this test can be used) for the duration of the COVID-19 declaration under Section 56 4(b)(1) of the Act, 21 U.S.C. section 360bbb-3(b)(1), unless the authorization is terminated or revoked sooner. Performed at [facility], [place] [address], [place], [place] [address]       Radiology Studies: CT ANGIO HEAD W OR WO CONTRAST  Result Date: 08/30/2019 CLINICAL DATA:  Stroke follow-up EXAM: CT ANGIOGRAPHY HEAD AND NECK TECHNIQUE: Multidetector CT imaging of the head and neck was performed using the standard protocol during bolus administration of intravenous contrast. Multiplanar CT image reconstructions and MIPs were  obtained to evaluate the vascular anatomy. Carotid stenosis measurements (when applicable) are obtained utilizing NASCET criteria, using the distal internal carotid diameter as the denominator. CONTRAST:  134mL OMNIPAQUE IOHEXOL 350 MG/ML SOLN COMPARISON:  Brain MRI 08/29/2019 FINDINGS: CTA NECK FINDINGS SKELETON: There is no bony spinal canal stenosis. No lytic or blastic lesion. OTHER NECK: Numerous bilateral cervical and supraclavicular lymph nodes. The largest are in the supraclavicular fossa and measure up to 9 mm. UPPER CHEST: There is scarring in the suprahilar left lung. AORTIC ARCH: There is minimal calcific atherosclerosis of the aortic arch. There is no aneurysm, dissection or hemodynamically significant stenosis of the visualized portion of the aorta. Conventional 3 vessel aortic branching pattern. The visualized proximal subclavian arteries  are widely patent. RIGHT CAROTID SYSTEM: Normal without aneurysm, dissection or stenosis. LEFT CAROTID SYSTEM: Normal without aneurysm, dissection or stenosis. VERTEBRAL ARTERIES: Codominant configuration. Both origins are clearly patent. There is no dissection, occlusion or flow-limiting stenosis to the skull base (V1-V3 segments). CTA HEAD FINDINGS POSTERIOR CIRCULATION: --Vertebral arteries: Normal V4 segments. --Posterior inferior cerebellar arteries (PICA): Patent origins from the vertebral arteries. --Anterior inferior cerebellar arteries (AICA): Patent origins from the basilar artery. --Basilar artery: Normal. --Superior cerebellar arteries: Normal. --Posterior cerebral arteries: Normal. Both originate from the basilar artery. Posterior communicating arteries (p-comm) are diminutive or absent. ANTERIOR CIRCULATION: --Intracranial internal carotid arteries: Normal. --Anterior cerebral arteries (ACA): Normal. Both A1 segments are present. Patent anterior communicating artery (a-comm). --Middle cerebral arteries (MCA): Normal. VENOUS SINUSES: As permitted by contrast timing, patent. ANATOMIC VARIANTS: None Review of the MIP images confirms the above findings. IMPRESSION: 1. No emergent large vessel occlusion or high-grade stenosis of the intracranial arteries. 2. Numerous bilateral cervical and supraclavicular lymph nodes. The largest are in the supraclavicular fossa and measure up to 9 mm, compatible with known lymphoma. Electronically Signed   By: Ulyses Jarred M.D.   On: 08/30/2019 03:03   CT HEAD WO CONTRAST  Result Date: 08/29/2019 CLINICAL DATA:  64 year old male with focal neurologic deficit. EXAM: CT HEAD WITHOUT CONTRAST TECHNIQUE: Contiguous axial images were obtained from the base of the skull through the vertex without intravenous contrast. COMPARISON:  None. FINDINGS: Brain: The ventricles and sulci appropriate size for patient's age. The gray-white matter discrimination is preserved. There is no  acute intracranial hemorrhage. No mass effect or midline shift. No extra-axial fluid collection. Vascular: No hyperdense vessel or unexpected calcification. Skull: Normal. Negative for fracture or focal lesion. Sinuses/Orbits: Mild mucoperiosteal thickening of paranasal sinuses. The mastoid air cells are clear. Other: Prior fixation of left zygomatic fracture. IMPRESSION: Unremarkable noncontrast CT of the brain. Electronically Signed   By: Anner Crete M.D.   On: 08/29/2019 19:58   CT ANGIO NECK W OR WO CONTRAST  Result Date: 08/30/2019 CLINICAL DATA:  Stroke follow-up EXAM: CT ANGIOGRAPHY HEAD AND NECK TECHNIQUE: Multidetector CT imaging of the head and neck was performed using the standard protocol during bolus administration of intravenous contrast. Multiplanar CT image reconstructions and MIPs were obtained to evaluate the vascular anatomy. Carotid stenosis measurements (when applicable) are obtained utilizing NASCET criteria, using the distal internal carotid diameter as the denominator. CONTRAST:  137mL OMNIPAQUE IOHEXOL 350 MG/ML SOLN COMPARISON:  Brain MRI 08/29/2019 FINDINGS: CTA NECK FINDINGS SKELETON: There is no bony spinal canal stenosis. No lytic or blastic lesion. OTHER NECK: Numerous bilateral cervical and supraclavicular lymph nodes. The largest are in the supraclavicular fossa and measure up to 9 mm. UPPER CHEST: There is scarring in the suprahilar left  lung. AORTIC ARCH: There is minimal calcific atherosclerosis of the aortic arch. There is no aneurysm, dissection or hemodynamically significant stenosis of the visualized portion of the aorta. Conventional 3 vessel aortic branching pattern. The visualized proximal subclavian arteries are widely patent. RIGHT CAROTID SYSTEM: Normal without aneurysm, dissection or stenosis. LEFT CAROTID SYSTEM: Normal without aneurysm, dissection or stenosis. VERTEBRAL ARTERIES: Codominant configuration. Both origins are clearly patent. There is no  dissection, occlusion or flow-limiting stenosis to the skull base (V1-V3 segments). CTA HEAD FINDINGS POSTERIOR CIRCULATION: --Vertebral arteries: Normal V4 segments. --Posterior inferior cerebellar arteries (PICA): Patent origins from the vertebral arteries. --Anterior inferior cerebellar arteries (AICA): Patent origins from the basilar artery. --Basilar artery: Normal. --Superior cerebellar arteries: Normal. --Posterior cerebral arteries: Normal. Both originate from the basilar artery. Posterior communicating arteries (p-comm) are diminutive or absent. ANTERIOR CIRCULATION: --Intracranial internal carotid arteries: Normal. --Anterior cerebral arteries (ACA): Normal. Both A1 segments are present. Patent anterior communicating artery (a-comm). --Middle cerebral arteries (MCA): Normal. VENOUS SINUSES: As permitted by contrast timing, patent. ANATOMIC VARIANTS: None Review of the MIP images confirms the above findings. IMPRESSION: 1. No emergent large vessel occlusion or high-grade stenosis of the intracranial arteries. 2. Numerous bilateral cervical and supraclavicular lymph nodes. The largest are in the supraclavicular fossa and measure up to 9 mm, compatible with known lymphoma. Electronically Signed   By: Ulyses Jarred M.D.   On: 08/30/2019 03:03   MR BRAIN WO CONTRAST  Result Date: 08/29/2019 CLINICAL DATA:  Awoke this morning with gait disturbance and slurred speech. EXAM: MRI HEAD WITHOUT CONTRAST TECHNIQUE: Multiplanar, multiecho pulse sequences of the brain and surrounding structures were obtained without intravenous contrast. COMPARISON:  Head CT same day. FINDINGS: Brain: Acute infarction affecting the right para median pons. No other acute brain insult. The brain is otherwise normal without atrophy, old or acute infarction elsewhere, mass lesion, hemorrhage, hydrocephalus or extra-axial collection. Axial T2 imaging was omitted. Vascular: Major vessels at the base of the brain show flow. Skull and upper  cervical spine: Negative Sinuses/Orbits: Clear Other: None IMPRESSION: Acute infarction of the right para median pons. Otherwise normal brain MRI. Electronically Signed   By: Nelson Chimes M.D.   On: 08/29/2019 21:11   DG Chest Port 1 View  Result Date: 08/29/2019 CLINICAL DATA:  Slurred speech and stroke-like symptoms EXAM: PORTABLE CHEST 1 VIEW COMPARISON:  CT from the previous day. FINDINGS: Cardiac shadow is stable. The lungs are well aerated bilaterally. Persistent right basilar density is noted medially similar to that seen on prior CT examination. No bony abnormality is noted. IMPRESSION: Stable right lower lobe infiltrate. Electronically Signed   By: Inez Catalina M.D.   On: 08/29/2019 22:31     Scheduled Meds:   stroke: mapping our early stages of recovery book   Does not apply Once   aspirin  300 mg Rectal Daily   Or   aspirin  325 mg Oral Daily   atorvastatin  20 mg Oral Daily   enoxaparin (LOVENOX) injection  40 mg Subcutaneous Q24H   insulin aspart  0-9 Units Subcutaneous TID WC   Continuous Infusions:  sodium chloride 100 mL/hr (08/30/19 0859)     LOS: 1 day   Time Spent in minutes   45 minutes  Amro Winebarger D.O. on 08/30/2019 at 3:06 PM  Between 7am to 7pm - Please see pager noted on [email or web address]  After 7pm go to [email or web address]  And look for the night coverage person covering for me after hours  [facility] Office  [phone]

## 2019-08-30 NOTE — ED Notes (Signed)
Tele   Breakfast ordered  

## 2019-08-30 NOTE — Progress Notes (Signed)
  Echocardiogram 2D Echocardiogram has been performed.  [name] [name] 08/30/2019, 12:07 PM

## 2019-08-30 NOTE — Progress Notes (Signed)
Rehab Admissions Coordinator Note:  Per PT recommendation, patient was screened by Michel Santee for appropriateness for an Inpatient Acute Rehab Consult.  At this time, we are recommending Inpatient Rehab consult.  I will place an order so we can evaluate.   Michel Santee 08/30/2019, 2:11 PM  I can be reached at [phone].

## 2019-08-30 NOTE — Progress Notes (Signed)
STROKE TEAM PROGRESS NOTE   INTERVAL HISTORY I personally reviewed history of presenting illness with the patient, electronic medical records and imaging films in PACS.  He woke up yesterday morning at 6 AM from sleep and was fine and then went back to sleep when he woke up at noon he noticed left-sided weakness which has persisted.  He has no prior history of strokes or TIAs.  He has been taking aspirin regularly.  MRI scan shows right paramedian pontine lacunar infarct.  CT angiogram shows no significant large vessel extracranial or intracranial stenosis. Transthoracic echo is unremarkable. His neurological exam is unchanged.  NIH is 6 with left hemiparesis and mild dysarthria He has a history of lymphoma but is on remission and is followed at the cancer clinic his white count and liver function and kidney function all stable Vitals:   08/30/19 0515 08/30/19 0530 08/30/19 0600 08/30/19 0700  BP:  (!) 144/74 138/76 (!) 141/71  Pulse: (!) 56 62 (!) 54 (!) 56  Resp: (!) 25 (!) 23 (!) 26 (!) 24  Temp:      TempSrc:      SpO2: 99% 98% 100% 99%  Weight:      Height:        CBC:  Recent Labs  Lab 08/27/19 0845 08/29/19 1931 08/29/19 1947 08/29/19 2004  WBC 4.1 4.3  --   --   NEUTROABS 2.6 3.2  --   --   HGB 9.7* 9.8* 9.9* 10.2*  HCT 28.8* 28.8* 29.0* 30.0*  MCV 71.5* 73.1*  --   --   PLT 313 310  --   --     Basic Metabolic Panel:  Recent Labs  Lab 08/27/19 0845 08/27/19 0845 08/29/19 1931 08/29/19 1947 08/29/19 2004  NA 129*  --  128* 128* 126*  K 3.6  --  4.1 4.0 5.4*  CL 96*  --  94* 94* 95*  CO2 24  --  23  --   --   GLUCOSE 169*  --  115* 111* 108*  BUN 10  --  _0 CREATININE 0.97   < > 0.86 0.80 0.80  CALCIUM 9.4  --  9.7  --   --   MG 1.8  --   --   --   --    < > = values in this interval not displayed.   Lipid Panel:     Component Value Date/Time   CHOL 131 08/30/2019 0225   TRIG 52 08/30/2019 0225   HDL 36 (L) 08/30/2019 0225   CHOLHDL 3.6  08/30/2019 0225   VLDL 10 08/30/2019 0225   LDLCALC 85 08/30/2019 0225   HgbA1c:  Lab Results  Component Value Date   HGBA1C 5.9 (H) 08/29/2019   Urine Drug Screen:     Component Value Date/Time   LABOPIA NONE DETECTED 08/29/2019 2118   COCAINSCRNUR NONE DETECTED 08/29/2019 2118   LABBENZ NONE DETECTED 08/29/2019 2118   AMPHETMU NONE DETECTED 08/29/2019 2118   THCU NONE DETECTED 08/29/2019 2118   LABBARB NONE DETECTED 08/29/2019 2118    Alcohol Level No results found for: ETH  IMAGING past 48 hours CT ANGIO HEAD W OR WO CONTRAST  Result Date: 08/30/2019 CLINICAL DATA:  Stroke follow-up EXAM: CT ANGIOGRAPHY HEAD AND NECK TECHNIQUE: Multidetector CT imaging of the head and neck was performed using the standard protocol during bolus administration of intravenous contrast. Multiplanar CT image reconstructions and MIPs were obtained to evaluate the vascular anatomy. Carotid  stenosis measurements (when applicable) are obtained utilizing NASCET criteria, using the distal internal carotid diameter as the denominator. CONTRAST:  170m OMNIPAQUE IOHEXOL 350 MG/ML SOLN COMPARISON:  Brain MRI 08/29/2019 FINDINGS: CTA NECK FINDINGS SKELETON: There is no bony spinal canal stenosis. No lytic or blastic lesion. OTHER NECK: Numerous bilateral cervical and supraclavicular lymph nodes. The largest are in the supraclavicular fossa and measure up to 9 mm. UPPER CHEST: There is scarring in the suprahilar left lung. AORTIC ARCH: There is minimal calcific atherosclerosis of the aortic arch. There is no aneurysm, dissection or hemodynamically significant stenosis of the visualized portion of the aorta. Conventional 3 vessel aortic branching pattern. The visualized proximal subclavian arteries are widely patent. RIGHT CAROTID SYSTEM: Normal without aneurysm, dissection or stenosis. LEFT CAROTID SYSTEM: Normal without aneurysm, dissection or stenosis. VERTEBRAL ARTERIES: Codominant configuration. Both origins are  clearly patent. There is no dissection, occlusion or flow-limiting stenosis to the skull base (V1-V3 segments). CTA HEAD FINDINGS POSTERIOR CIRCULATION: --Vertebral arteries: Normal V4 segments. --Posterior inferior cerebellar arteries (PICA): Patent origins from the vertebral arteries. --Anterior inferior cerebellar arteries (AICA): Patent origins from the basilar artery. --Basilar artery: Normal. --Superior cerebellar arteries: Normal. --Posterior cerebral arteries: Normal. Both originate from the basilar artery. Posterior communicating arteries (p-comm) are diminutive or absent. ANTERIOR CIRCULATION: --Intracranial internal carotid arteries: Normal. --Anterior cerebral arteries (ACA): Normal. Both A1 segments are present. Patent anterior communicating artery (a-comm). --Middle cerebral arteries (MCA): Normal. VENOUS SINUSES: As permitted by contrast timing, patent. ANATOMIC VARIANTS: None Review of the MIP images confirms the above findings. IMPRESSION: 1. No emergent large vessel occlusion or high-grade stenosis of the intracranial arteries. 2. Numerous bilateral cervical and supraclavicular lymph nodes. The largest are in the supraclavicular fossa and measure up to 9 mm, compatible with known lymphoma. Electronically Signed   By: KUlyses JarredM.D.   On: 08/30/2019 03:03   CT HEAD WO CONTRAST  Result Date: 08/29/2019 CLINICAL DATA:  64year old male with focal neurologic deficit. EXAM: CT HEAD WITHOUT CONTRAST TECHNIQUE: Contiguous axial images were obtained from the base of the skull through the vertex without intravenous contrast. COMPARISON:  None. FINDINGS: Brain: The ventricles and sulci appropriate size for patient's age. The gray-white matter discrimination is preserved. There is no acute intracranial hemorrhage. No mass effect or midline shift. No extra-axial fluid collection. Vascular: No hyperdense vessel or unexpected calcification. Skull: Normal. Negative for fracture or focal lesion.  Sinuses/Orbits: Mild mucoperiosteal thickening of paranasal sinuses. The mastoid air cells are clear. Other: Prior fixation of left zygomatic fracture. IMPRESSION: Unremarkable noncontrast CT of the brain. Electronically Signed   By: AAnner CreteM.D.   On: 08/29/2019 19:58   CT ANGIO NECK W OR WO CONTRAST  Result Date: 08/30/2019 CLINICAL DATA:  Stroke follow-up EXAM: CT ANGIOGRAPHY HEAD AND NECK TECHNIQUE: Multidetector CT imaging of the head and neck was performed using the standard protocol during bolus administration of intravenous contrast. Multiplanar CT image reconstructions and MIPs were obtained to evaluate the vascular anatomy. Carotid stenosis measurements (when applicable) are obtained utilizing NASCET criteria, using the distal internal carotid diameter as the denominator. CONTRAST:  1060mOMNIPAQUE IOHEXOL 350 MG/ML SOLN COMPARISON:  Brain MRI 08/29/2019 FINDINGS: CTA NECK FINDINGS SKELETON: There is no bony spinal canal stenosis. No lytic or blastic lesion. OTHER NECK: Numerous bilateral cervical and supraclavicular lymph nodes. The largest are in the supraclavicular fossa and measure up to 9 mm. UPPER CHEST: There is scarring in the suprahilar left lung. AORTIC ARCH: There is minimal calcific  atherosclerosis of the aortic arch. There is no aneurysm, dissection or hemodynamically significant stenosis of the visualized portion of the aorta. Conventional 3 vessel aortic branching pattern. The visualized proximal subclavian arteries are widely patent. RIGHT CAROTID SYSTEM: Normal without aneurysm, dissection or stenosis. LEFT CAROTID SYSTEM: Normal without aneurysm, dissection or stenosis. VERTEBRAL ARTERIES: Codominant configuration. Both origins are clearly patent. There is no dissection, occlusion or flow-limiting stenosis to the skull base (V1-V3 segments). CTA HEAD FINDINGS POSTERIOR CIRCULATION: --Vertebral arteries: Normal V4 segments. --Posterior inferior cerebellar arteries (PICA):  Patent origins from the vertebral arteries. --Anterior inferior cerebellar arteries (AICA): Patent origins from the basilar artery. --Basilar artery: Normal. --Superior cerebellar arteries: Normal. --Posterior cerebral arteries: Normal. Both originate from the basilar artery. Posterior communicating arteries (p-comm) are diminutive or absent. ANTERIOR CIRCULATION: --Intracranial internal carotid arteries: Normal. --Anterior cerebral arteries (ACA): Normal. Both A1 segments are present. Patent anterior communicating artery (a-comm). --Middle cerebral arteries (MCA): Normal. VENOUS SINUSES: As permitted by contrast timing, patent. ANATOMIC VARIANTS: None Review of the MIP images confirms the above findings. IMPRESSION: 1. No emergent large vessel occlusion or high-grade stenosis of the intracranial arteries. 2. Numerous bilateral cervical and supraclavicular lymph nodes. The largest are in the supraclavicular fossa and measure up to 9 mm, compatible with known lymphoma. Electronically Signed   By: Ulyses Jarred M.D.   On: 08/30/2019 03:03   CT Chest W Contrast  Result Date: 08/28/2019 CLINICAL DATA:  Follow-up B-cell lymphoma after 2 cycles of induction chemo-immunotherapy EXAM: CT CHEST, ABDOMEN, AND PELVIS WITH CONTRAST TECHNIQUE: Multidetector CT imaging of the chest, abdomen and pelvis was performed following the standard protocol during bolus administration of intravenous contrast. CONTRAST:  14m OMNIPAQUE IOHEXOL 300 MG/ML  SOLN COMPARISON:  PET-CT dated 05/15/2019 FINDINGS: CT CHEST FINDINGS Cardiovascular: The heart is normal in size. No pericardial effusion. No evidence of thoracic aortic aneurysm. Mild atherosclerotic calcifications of the aortic arch. Mediastinum/Nodes: Calcified mediastinal and bilateral perihilar nodes. Dominant 10 mm short axis right paratracheal node, unchanged. Mildly prominent bilateral axillary nodes, measuring up to 10 mm short axis, previously 2.2 cm. Visualized thyroid is  unremarkable. Lungs/Pleura: Prior central left upper lobe/suprahilar nodule has resolved. Calcifications with scarring in the left perihilar region. Patchy opacity in the medial right lower lobe, measuring 1.5 x 7.4 cm (series 4/image 104), new. Patchy/nodular opacity inferiorly in the right middle lobe measuring up to 13 mm (series 4/image 117), new. No pleural effusion or pneumothorax. Musculoskeletal: Degenerative changes of the thoracic spine. CT ABDOMEN PELVIS FINDINGS Hepatobiliary: Liver is within normal limits. Gallbladder is unremarkable. No intrahepatic or extrahepatic ductal dilatation. Pancreas: Within normal limits. Spleen: Spleen is normal in size. Adrenals/Urinary Tract: Adrenal glands are within normal limits. Small bilateral renal cysts, measuring up to 1.6 cm in the posterior right lower kidney. No hydronephrosis. Mildly thick-walled bladder, although underdistended. Stomach/Bowel: Stomach is notable for a tiny hiatal hernia. No evidence of bowel obstruction. Normal appendix (series 2/image 83). Vascular/Lymphatic: No evidence of abdominal aortic aneurysm. Mild atherosclerotic calcifications the bilateral common iliac arteries. Small para-aortic nodes, measuring up to 12 mm short axis on the right. Dominant 1.8 cm short axis right common iliac node (series 2/image 39), previously 4.0 cm. 11 mm short axis right external iliac node (series 2/image 100), previously 16 mm. Reproductive: Prostate is grossly unremarkable. Other: No abdominopelvic ascites. Musculoskeletal: Lumbar spine is unremarkable. IMPRESSION: Improving lymphadenopathy in the chest, abdomen, and pelvis. Dominant axillary nodes measure up to 10 mm, previously 2.2 cm. Dominant right common iliac node  measures 1.8 cm, previously 4.0 cm. Prior left upper lobe/suprahilar nodular opacity has resolved. New patchy/nodular opacities in the right middle and lower lobes. While lymphomatous involvement is possible, the appearance favors  infection/inflammation. Spleen is normal in size. Electronically Signed   By: Julian Hy M.D.   On: 08/28/2019 16:02   MR BRAIN WO CONTRAST  Result Date: 08/29/2019 CLINICAL DATA:  Awoke this morning with gait disturbance and slurred speech. EXAM: MRI HEAD WITHOUT CONTRAST TECHNIQUE: Multiplanar, multiecho pulse sequences of the brain and surrounding structures were obtained without intravenous contrast. COMPARISON:  Head CT same day. FINDINGS: Brain: Acute infarction affecting the right para median pons. No other acute brain insult. The brain is otherwise normal without atrophy, old or acute infarction elsewhere, mass lesion, hemorrhage, hydrocephalus or extra-axial collection. Axial T2 imaging was omitted. Vascular: Major vessels at the base of the brain show flow. Skull and upper cervical spine: Negative Sinuses/Orbits: Clear Other: None IMPRESSION: Acute infarction of the right para median pons. Otherwise normal brain MRI. Electronically Signed   By: Nelson Chimes M.D.   On: 08/29/2019 21:11   CT Abdomen Pelvis W Contrast  Result Date: 08/28/2019 CLINICAL DATA:  Follow-up B-cell lymphoma after 2 cycles of induction chemo-immunotherapy EXAM: CT CHEST, ABDOMEN, AND PELVIS WITH CONTRAST TECHNIQUE: Multidetector CT imaging of the chest, abdomen and pelvis was performed following the standard protocol during bolus administration of intravenous contrast. CONTRAST:  167m OMNIPAQUE IOHEXOL 300 MG/ML  SOLN COMPARISON:  PET-CT dated 05/15/2019 FINDINGS: CT CHEST FINDINGS Cardiovascular: The heart is normal in size. No pericardial effusion. No evidence of thoracic aortic aneurysm. Mild atherosclerotic calcifications of the aortic arch. Mediastinum/Nodes: Calcified mediastinal and bilateral perihilar nodes. Dominant 10 mm short axis right paratracheal node, unchanged. Mildly prominent bilateral axillary nodes, measuring up to 10 mm short axis, previously 2.2 cm. Visualized thyroid is unremarkable. Lungs/Pleura:  Prior central left upper lobe/suprahilar nodule has resolved. Calcifications with scarring in the left perihilar region. Patchy opacity in the medial right lower lobe, measuring 1.5 x 7.4 cm (series 4/image 104), new. Patchy/nodular opacity inferiorly in the right middle lobe measuring up to 13 mm (series 4/image 117), new. No pleural effusion or pneumothorax. Musculoskeletal: Degenerative changes of the thoracic spine. CT ABDOMEN PELVIS FINDINGS Hepatobiliary: Liver is within normal limits. Gallbladder is unremarkable. No intrahepatic or extrahepatic ductal dilatation. Pancreas: Within normal limits. Spleen: Spleen is normal in size. Adrenals/Urinary Tract: Adrenal glands are within normal limits. Small bilateral renal cysts, measuring up to 1.6 cm in the posterior right lower kidney. No hydronephrosis. Mildly thick-walled bladder, although underdistended. Stomach/Bowel: Stomach is notable for a tiny hiatal hernia. No evidence of bowel obstruction. Normal appendix (series 2/image 83). Vascular/Lymphatic: No evidence of abdominal aortic aneurysm. Mild atherosclerotic calcifications the bilateral common iliac arteries. Small para-aortic nodes, measuring up to 12 mm short axis on the right. Dominant 1.8 cm short axis right common iliac node (series 2/image 39), previously 4.0 cm. 11 mm short axis right external iliac node (series 2/image 100), previously 16 mm. Reproductive: Prostate is grossly unremarkable. Other: No abdominopelvic ascites. Musculoskeletal: Lumbar spine is unremarkable. IMPRESSION: Improving lymphadenopathy in the chest, abdomen, and pelvis. Dominant axillary nodes measure up to 10 mm, previously 2.2 cm. Dominant right common iliac node measures 1.8 cm, previously 4.0 cm. Prior left upper lobe/suprahilar nodular opacity has resolved. New patchy/nodular opacities in the right middle and lower lobes. While lymphomatous involvement is possible, the appearance favors infection/inflammation. Spleen is  normal in size. Electronically Signed   By:  Julian Hy M.D.   On: 08/28/2019 16:02   DG Chest Port 1 View  Result Date: 08/29/2019 CLINICAL DATA:  Slurred speech and stroke-like symptoms EXAM: PORTABLE CHEST 1 VIEW COMPARISON:  CT from the previous day. FINDINGS: Cardiac shadow is stable. The lungs are well aerated bilaterally. Persistent right basilar density is noted medially similar to that seen on prior CT examination. No bony abnormality is noted. IMPRESSION: Stable right lower lobe infiltrate. Electronically Signed   By: Inez Catalina M.D.   On: 08/29/2019 22:31    PHYSICAL EXAM Pleasant middle-aged African-American male not in distress. . Afebrile. Head is nontraumatic. Neck is supple without bruit.    Cardiac exam no murmur or gallop. Lungs are clear to auscultation. Distal pulses are well felt. Neurological Exam :  He is awake alert oriented to time place and person.  No aphasia or apraxia but he has mild dysarthria and slurred some occasional words.  Follows commands very well.  Extraocular movements are full range without nystagmus.  There is mild saccadic dysmetria on left gaze.  Face is symmetric without weakness.  Tongue midline.  Motor system exam shows dense left hemiplegia with left upper extremity 0/5 strength but withdraws to painful stimuli.  Left lower extremity strength is 2/5 with drift and leg hitting the ground.  Sensation is preserved bilaterally.  Deep tendon flexes brisker on the left compared to the right.  Left plantar upgoing right downgoing.  Gait not tested.  NIHSS 6  ASSESSMENT/PLAN Mr. LAWSEN ARNOTT is a 64 y.o. male with history of HTN, DB presenting with L sided weakness.   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  MRI  R paramedian pontine infarct   CTA head & neck no LVO or high-grade stenoses. Numerous cervical and supraclavicular LN c/w known lymphoma  2D Echo normal ejection  fraction no cardiac source of embolism.    LDL 85  HgbA1c 5.9  Lovenox 40 mg sq daily for VTE prophylaxis  aspirin 81 mg daily prior to admission, now on aspirin 325 mg daily.   Therapy recommendations:  CIR  Disposition:  pending   Hypertension  Stable . BP goal normotensive  Hyperlipidemia  Home meds:  No statin (had been on in the past)  Now on lipitor 20  LDL 85, goal < 70  Will not use high intensity statin in this pt given almost normal LDL   Continue statin at discharge  Other Stroke Risk Factors  Cigar smoker, has smoked within the past year  ETOH use, advised to drink no more than 2 drink(s) a day  Other Active Problems  Chronic hyponatremia  Hyperkalemia  CLL/small B-cell lymphoma  Hospital day # 1  I have personally obtained history,examined this patient, reviewed notes, independently viewed imaging studies, participated in medical decision making and plan of care.ROS completed by me personally and pertinent positives fully documented  I have made any additions or clarifications directly to the above note.  He presented with sudden onset of left hemiparesis yesterday due to right paramedian pontine infarct from small vessel disease.  Continue ongoing stroke work-up and change aspirin to Plavix for stroke prevention.  Patient is interested in participating in the [employer] factor XI inhibitor stroke prevention trial.  He was given consent form to review and time to ask questions which were answered.  He met inclusion exclusion criteria and will be randomized later today.  Discussed with Dr. Ree Kida and answered questions.  Greater than 50% time during the 35-minute visit was spent on counseling and coordination of care about his stroke and discussion about stroke prevention and treatment and answering questions Antony Contras, MD Medical Director [place] Pager: [phone] 08/30/2019 5:32 PM   To contact Stroke Continuity provider,  please refer to [email or web address] After hours, contact General Neurology

## 2019-08-30 NOTE — Progress Notes (Signed)
STROKE RESEARCH STUDY NOTE  Patient is   participating in the [employer] factor XI inhibitor stroke prevention trial.  He was given consent form to review and time to ask questions which were answered.  He met inclusion exclusion criteria and will be randomized. This study involves using standard of care antiplatelet therapy with and without factor XI inhibitor oral medication ( [facility]AY [id number])  Antony Contras, [place] Pager: [phone] 08/30/2019 5:53 PM

## 2019-08-30 NOTE — Evaluation (Signed)
Physical Therapy Evaluation Patient Details Name: [name] [name] MRN: [id number] DOB: 07-14-1956 Today's Date: 08/30/2019   History of Present Illness  64 yo male admitted to ED on 1/8 with staggering gait, L hemiparesis, dysarthria, and fall due to weakness. 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 Pt with CLL with recent CT chest, abdomen, and pelvis revealing improving lymphadenopathy. Other notable PMH includes DM, sleep apnea, nasal septoplasty 04/2018.  Clinical Impression   Pt presents with L hemiparesis, WFL sensation LUE/LLE, difficulty performing bed mobility/transfer, poor sitting and standing balance, and decreased activity tolerance. Pt to benefit from acute PT to address deficits. Pt requires mod-max assist for EOB activity this session, and unable to sit EOB without PT support. Pt is very motivated to progress mobility, PT feels pt would be a great CIR candidate. Pt lives at home with his mother, and was completely independent PTA. PT to progress mobility as tolerated, and will continue to follow acutely.      Follow Up Recommendations CIR    Equipment Recommendations  Other (comment)(defer to next venue)    Recommendations for Other Services       Precautions / Restrictions Precautions Precautions: Fall Precaution Comments: L hemiparesis Restrictions Weight Bearing Restrictions: No      Mobility  Bed Mobility Overal bed mobility: Needs Assistance Bed Mobility: Supine to Sit;Sit to Supine     Supine to sit: Mod assist;HOB elevated Sit to supine: Max assist;HOB elevated   General bed mobility comments: mod-max assist for supine<>sit fro trunk and LE management, scooting to and from EOB. Pt unable to use/move LLE/LUE for bed mobility.  Transfers Overall transfer level: Needs assistance Equipment used: 1 person hand held assist Transfers: Sit to/from Stand Sit to Stand: Max assist;From elevated surface         General  transfer comment: max assist for power up, hip extension with PT facilitation at ischial tuberosities, and LLE blocking in standing due to weakness. Pt with very brief stand before requiring sit EOB.  Ambulation/Gait             General Gait Details: unable  Stairs            Wheelchair Mobility    Modified Rankin (Stroke Patients Only)       Balance Overall balance assessment: Needs assistance Sitting-balance support: Bilateral upper extremity supported;Feet supported Sitting balance-Leahy Scale: Poor Sitting balance - Comments: mod assist for trunk support to maintain sitting balance   Standing balance support: No upper extremity supported;During functional activity Standing balance-Leahy Scale: Zero Standing balance comment: max assist from PT to stand                             Pertinent Vitals/Pain Pain Assessment: No/denies pain    Home Living Family/patient expects to be discharged to:: Private residence Living Arrangements: Parent(81 yo mother) Available Help at Discharge: Family;Available PRN/intermittently(son can assist pt before work if needed, not 24/7.) Type of Home: House Home Access: Ramped entrance     [place]: One level [place]: None      Prior Function Level of Independence: Independent         Comments: pt reports being retired from working for [employer], assists his 25 y/o mother as needed. Pt's mother has an aide that assists her in the am for a couple of hours.     Hand Dominance   Dominant Hand: Right  Extremity/Trunk Assessment   Upper Extremity Assessment Upper Extremity Assessment: LUE deficits/detail LUE Deficits / Details: unable to grip PT fingers, trace biceps and triceps contraction noted with pt attempted elbow flexion/extension LUE Sensation: WNL    Lower Extremity Assessment Lower Extremity Assessment: LLE deficits/detail LLE Deficits / Details: MMT, modified supine  positoning: 0/5 DF, PF. 1/5 hip abduction. 2/5 knee ext/flex, hip flex/ext. LLE Sensation: WNL    Cervical / Trunk Assessment Cervical / Trunk Assessment: Other exceptions Cervical / Trunk Exceptions: forward flexed trunk due to trucal instability in sitting  Communication   Communication: No difficulties  Cognition Arousal/Alertness: Awake/alert Behavior During Therapy: WFL for tasks assessed/performed Overall Cognitive Status: Impaired/Different from baseline Area of Impairment: Problem solving;Following commands                       Following Commands: Follows one step commands consistently     Problem Solving: Slow processing;Requires verbal cues;Requires tactile cues;Difficulty sequencing General Comments: Pt requires slightly increased processing time to respond to PT, mild dysarthria and difficulty with word finding noted during eval.      General Comments General comments (skin integrity, edema, etc.): vision WFL, smooth pursuits horizontally [facility]    Exercises     Assessment/Plan    PT Assessment Patient needs continued PT services  PT Problem List Decreased strength;Decreased mobility;Impaired tone;Decreased activity tolerance;Decreased balance;Decreased knowledge of use of DME       PT Treatment Interventions DME instruction;Therapeutic activities;Gait training;Therapeutic exercise;Patient/family education;Balance training;Functional mobility training;Neuromuscular re-education    PT Goals (Current goals can be found in the Care Plan section)  Acute Rehab PT Goals Patient Stated Goal: return to independence PT Goal Formulation: With patient Time For Goal Achievement: 09/13/19 Potential to Achieve Goals: Fair    Frequency Min 4X/week   Barriers to discharge        Co-evaluation               AM-PAC PT "6 Clicks" Mobility  Outcome Measure Help needed turning from your back to your side while in a flat bed without using bedrails?: A  Lot Help needed moving from lying on your back to sitting on the side of a flat bed without using bedrails?: Total Help needed moving to and from a bed to a chair (including a wheelchair)?: Total Help needed standing up from a chair using your arms (e.g., wheelchair or bedside chair)?: Total Help needed to walk in hospital room?: Total Help needed climbing 3-5 steps with a railing? : Total 6 Click Score: 7    End of Session   Activity Tolerance: Patient limited by fatigue Patient left: in bed;with call bell/phone within reach Nurse Communication: Mobility status PT Visit Diagnosis: Hemiplegia and hemiparesis;Muscle weakness (generalized) (M62.81) Hemiplegia - Right/Left: Left Hemiplegia - dominant/non-dominant: Non-dominant Hemiplegia - caused by: Cerebral infarction    Time: [id number] PT Time Calculation (min) (ACUTE ONLY): 25 min   Charges:   PT Evaluation $PT Eval Low Complexity: 1 Low PT Treatments $Therapeutic Activity: 8-22 mins      Drayke Grabel E, PT Acute Rehabilitation Services Pager [phone]  Office [phone]   Taj Nevins D Hansel Devan 08/30/2019, 12:02 PM

## 2019-08-30 NOTE — Consult Note (Signed)
Neurology Consultation Reason for Consult: Stroke Referring Physician: Sharee Pimple  CC: Stroke  History is obtained from: Patient  HPI: [name] [name] is a 64 y.o. male with a history of diabetes, hypertension who presents with difficulty walking left-sided weakness that started on awakening this afternoon.  He states that he is up around 6 AM and is complaining was okay at that point, but then went back to bed and when he woke shortly before noon he was having symptoms.  He then came into the emergency department and underwent an MRI scan which reveals a pontine infarct.  He denies numbness.   LKW: 6 AM tpa given?: no, outside of window NIHSS: 6   ROS: A 14 point ROS was performed and is negative except as noted in the HPI.   Past Medical History:  Diagnosis Date  . Allergic rhinitis, cause unspecified   . Backache, unspecified   . Body mass index 33.0-33.9, adult   . Diabetes mellitus without complication ([place])    Type II  . Elevated blood pressure reading without diagnosis of hypertension   . Esophageal reflux   . Generalized pain   . Heartburn   . Insomnia, unspecified   . 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 Other abnormal blood chemistry   . Other and unspecified hyperlipidemia   . Other dyspnea and respiratory abnormality   . Other nonspecific findings on examination of blood(790.99)   . Pain in joint, lower leg   . Pneumonia    1968  . Sleep apnea    Does not use or own a CPAP  . Tobacco use disorder   . Unspecified essential hypertension      Family History  Problem Relation Age of Onset  . Diabetes Mother   . Hypertension Mother   . Cancer Paternal Uncle      Social History:  reports that he quit smoking about 7 years ago. His smoking use included cigars. He has a 4.00 pack-year smoking history. He has never used smokeless tobacco. He reports current alcohol use. He reports that he does not use  drugs.   Exam: Current vital signs: BP 130/75   Pulse 64   Temp 98 F (36.7 C) (Oral)   Resp (!) 27   Ht 5\' 7"  (1.702 m)   Wt 68 kg   SpO2 98%   BMI 23.49 kg/m  Vital signs in last 24 hours: Temp:  [98 F (36.7 C)] 98 F (36.7 C) (01/07 1920) Pulse Rate:  [64-86] 64 (01/07 2330) Resp:  [13-27] 27 (01/07 2330) BP: (130-136)/(69-78) 130/75 (01/07 2330) SpO2:  [98 %-100 %] 98 % (01/07 2330) Weight:  [68 kg] 68 kg (01/07 1923)   Physical Exam  Constitutional: Appears well-developed and well-nourished.  Psych: Affect appropriate to situation Eyes: No scleral injection HENT: No OP obstrucion MSK: no joint deformities.  Cardiovascular: Normal rate and regular rhythm.  Respiratory: Effort normal, non-labored breathing GI: Soft.  No distension. There is no tenderness.  Skin: WDI  Neuro: Mental Status: Patient is awake, alert, oriented to person, place, month, year, and situation. Patient is able to give a clear and coherent history. No signs of aphasia or neglect Cranial Nerves: II: Visual Fields are full. Pupils are equal, round, and reactive to light.   III,IV, VI: EOMI without ptosis or diploplia.  V: Facial sensation is symmetric to temperature VII: Facial movement is symmetric.  VIII: hearing is intact to voice X: Uvula elevates symmetrically  XI: Shoulder shrug is symmetric. XII: tongue is midline without atrophy or fasciculations.  Motor: 3/5 weakness of the left arm and 2/5 weakness of the left leg, 5/5 on the right Sensory: Sensation is symmetric to light touch and temperature in the arms and legs. 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  He will need to be admitted for therapy and risk factor  modification.  Recommendations: - HgbA1c, fasting lipid panel - MRI  of the brain without contrast - Frequent neuro checks - Echocardiogram - CTA head and neck - Prophylactic therapy-Antiplatelet med: Aspirin - dose 325mg  PO or 300mg  PR - Risk factor modification - Telemetry monitoring - PT consult, OT consult, Speech consult - Stroke team to follow   Roland Rack, MD [facility] [phone]  If 7pm- 7am, please page neurology on call as listed in [place].

## 2019-08-31 LAB — CBC
HCT: 27.3 % — ABNORMAL LOW (ref 39.0–52.0)
Hemoglobin: 9.3 g/dL — ABNORMAL LOW (ref 13.0–17.0)
MCH: 24.6 pg — ABNORMAL LOW (ref 26.0–34.0)
MCHC: 34.1 g/dL (ref 30.0–36.0)
MCV: 72.2 fL — ABNORMAL LOW (ref 80.0–100.0)
Platelets: 362 10*3/uL (ref 150–400)
RBC: 3.78 MIL/uL — ABNORMAL LOW (ref 4.22–5.81)
RDW: 17.3 % — ABNORMAL HIGH (ref 11.5–15.5)
WBC: 3.7 10*3/uL — ABNORMAL LOW (ref 4.0–10.5)
nRBC: 0 % (ref 0.0–0.2)

## 2019-08-31 LAB — BASIC METABOLIC PANEL
Anion gap: 10 (ref 5–15)
BUN: 7 mg/dL — ABNORMAL LOW (ref 8–23)
CO2: 22 mmol/L (ref 22–32)
Calcium: 8.8 mg/dL — ABNORMAL LOW (ref 8.9–10.3)
Chloride: 97 mmol/L — ABNORMAL LOW (ref 98–111)
Creatinine, Ser: 0.99 mg/dL (ref 0.61–1.24)
GFR calc Af Amer: >60 mL/min (ref >60)
GFR calc non Af Amer: >60 mL/min (ref >60)
Glucose, Bld: 145 mg/dL — ABNORMAL HIGH (ref 70–99)
Potassium: 3.5 mmol/L (ref 3.5–5.1)
Sodium: 129 mmol/L — ABNORMAL LOW (ref 135–145)

## 2019-08-31 LAB — GLUCOSE, CAPILLARY
Glucose-Capillary: 114 mg/dL — ABNORMAL HIGH (ref 70–99)
Glucose-Capillary: 116 mg/dL — ABNORMAL HIGH (ref 70–99)
Glucose-Capillary: 94 mg/dL (ref 70–99)
Glucose-Capillary: 112 mg/dL — ABNORMAL HIGH (ref 70–99)

## 2019-08-31 NOTE — Progress Notes (Addendum)
PROGRESS NOTE    [name] [name]  [id number] DOB: 1956/05/18 DOA: 08/29/2019 PCP: Bernerd Limbo, MD   Brief Narrative:  HPI on 08/29/2019 by Dr. Jennette Kettle [name] [name] is a 64 y.o. male with medical history significant of CLL / small B cell lymphoma, DM2, HTN. Patient with L sided weakness in upper and lower extremities onset yesterday.  Unclear if patients symptoms changed throughout day, but they are persistent. No confusion, no fever, no SOB.  Interim history  Admitted for Acute CVA. Neuro consulted. Now pending CIR.  Assessment & Plan   Acute ischemic CVA -CT head was unremarkable -MRI brain: Acute infarction of the right paramedian pons -Echocardiogram pending -LDL 85 hemoglobin A1c 5.9 -PT recommended CIR -Inpatient rehab consulted and appreciated -Neurology consulted and appreciated, placed on plavix and enrolled in [place] Factor XI inhibitor stroke prevention trial -Continue statin, plavix  Hyponatremia, chronic -Review of chart shows that patient did have a low sodium in Sep through November 2020 -sodium 129 -Continue IV fluids -Monitor BMP  Hyperkalemia -resolved, continue to monitor BMP  CLL/small B-cell lymphoma -Last chemotherapy appears to occurred in October 2020 -Patient sees Dr. Claiborne Billings  Diabetes mellitus, type II -Metfomin held -A1c 5.9 -Continue insulin sliding scale CBG monitoring  Essential Hypertension -Allow for permissive hypertension given acute CVA -BP stable  DVT Prophylaxis Lovenox  Code Status: Full  Family Communication: None at bedside  Disposition Plan: Admitted.  Pending CIR consultation and placement  Consultants Neurology Inpatient rehab  Procedures  Echocardiogram  Antibiotics   Anti-infectives (From admission, onward)   None      Subjective:   [name] [name] seen and examined today.  Patient denies chest pain, shortness breath, abdominal pain, nausea or vomiting, diarrhea constipation, dizziness or  headache.  Continues to not be able to move his left side.  Objective:   Vitals:   08/30/19 2108 08/31/19 0000 08/31/19 0015 08/31/19 0831  BP: (!) 157/78 (!) 148/94  (!) 137/98  Pulse: 73 83 80 71  Resp: 18 18 18 18   Temp: 100 F (37.8 C) 100.3 F (37.9 C)  99.1 F (37.3 C)  TempSrc: Oral Oral  Oral  SpO2: 99% 100%  100%  Weight:      Height:        Intake/Output Summary (Last 24 hours) at 08/31/2019 0933 Last data filed at 08/31/2019 0000 Gross per 24 hour  Intake 200 ml  Output 400 ml  Net -200 ml   Filed Weights   08/29/19 1923  Weight: 68 kg   Exam  General: Well developed, well nourished, NAD, appears stated age  40: NCAT, mucous membranes moist.   Cardiovascular: S1 S2 auscultated, RRR  Respiratory: Clear to auscultation bilaterally   Abdomen: Soft, nontender, nondistended, + bowel sounds  Extremities: warm dry without cyanosis clubbing or edema  Neuro: AAOx3, left sided weakness (2-3/5), otherwise nonfocal  Psych: Appropriate mood and affect, pleasant  Data Reviewed: I have personally reviewed following labs and imaging studies  CBC: Recent Labs  Lab 08/27/19 0845 08/29/19 1931 08/29/19 1947 08/29/19 2004 08/31/19 0402  WBC 4.1 4.3  --   --  3.7*  NEUTROABS 2.6 3.2  --   --   --   HGB 9.7* 9.8* 9.9* 10.2* 9.3*  HCT 28.8* 28.8* 29.0* 30.0* 27.3*  MCV 71.5* 73.1*  --   --  72.2*  PLT 313 310  --   --  [id number]   Basic Metabolic Panel: Recent Labs  Lab 08/27/19 0845  08/29/19 1931 08/29/19 1947 08/29/19 2004 08/31/19 0402  NA 129* 128* 128* 126* 129*  K 3.6 4.1 4.0 5.4* 3.5  CL 96* 94* 94* 95* 97*  CO2 24 23  --   --  22  GLUCOSE 169* 115* 111* 108* 145*  BUN 10 8 9 11  7*  CREATININE 0.97 0.86 0.80 0.80 0.99  CALCIUM 9.4 9.7  --   --  8.8*  MG 1.8  --   --   --   --    GFR: Estimated Creatinine Clearance: 72.3 mL/min (by C-G formula based on SCr of 0.99 mg/dL). Liver Function Tests: Recent Labs  Lab 08/27/19 0845 08/29/19 1931    AST 14* 18  ALT 8 8  ALKPHOS 59 61  BILITOT 1.7* 1.3*  PROT 7.0 7.0  ALBUMIN 3.9 4.0   No results for input(s): LIPASE, AMYLASE in the last 168 hours. No results for input(s): AMMONIA in the last 168 hours. Coagulation Profile: Recent Labs  Lab 08/29/19 1931  INR 1.0   Cardiac Enzymes: No results for input(s): CKTOTAL, CKMB, CKMBINDEX, TROPONINI in the last 168 hours. BNP (last 3 results) No results for input(s): PROBNP in the last 8760 hours. HbA1C: Recent Labs    08/29/19 1931  HGBA1C 5.9*   CBG: Recent Labs  Lab 08/29/19 2356 08/30/19 0750 08/30/19 1115 08/30/19 2112 08/31/19 0713  GLUCAP 101* 126* 134* 96 114*   Lipid Profile: Recent Labs    08/30/19 0225  CHOL 131  HDL 36*  LDLCALC 85  TRIG 52  CHOLHDL 3.6   Thyroid Function Tests: No results for input(s): TSH, T4TOTAL, FREET4, T3FREE, THYROIDAB in the last 72 hours. Anemia Panel: No results for input(s): VITAMINB12, FOLATE, FERRITIN, TIBC, IRON, RETICCTPCT in the last 72 hours. Urine analysis:    Component Value Date/Time   COLORURINE YELLOW 08/29/2019 2118   APPEARANCEUR CLEAR 08/29/2019 2118   LABSPEC 1.014 08/29/2019 2118   PHURINE 6.0 08/29/2019 2118   GLUCOSEU NEGATIVE 08/29/2019 2118   HGBUR NEGATIVE 08/29/2019 2118   BILIRUBINUR NEGATIVE 08/29/2019 2118   [place] NEGATIVE 08/29/2019 2118   PROTEINUR NEGATIVE 08/29/2019 2118   NITRITE NEGATIVE 08/29/2019 2118   LEUKOCYTESUR NEGATIVE 08/29/2019 2118   Sepsis Labs: @LABRCNTIP (procalcitonin:4,lacticidven:4)  ) Recent Results (from the past 240 hour(s))  SARS CORONAVIRUS 2 (TAT 6-24 HRS) Nasopharyngeal Nasopharyngeal Swab     Status: None   Collection Time: 08/29/19  9:18 PM   Specimen: Nasopharyngeal Swab  Result Value Ref Range Status   SARS Coronavirus 2 NEGATIVE NEGATIVE Final    Comment: (NOTE) SARS-CoV-2 target nucleic acids are NOT DETECTED. The SARS-CoV-2 RNA is generally detectable in upper and lower respiratory  specimens during the acute phase of infection. Negative results do not preclude SARS-CoV-2 infection, do not rule out co-infections with other pathogens, and should not be used as the sole basis for treatment or other patient management decisions. Negative results must be combined with clinical observations, patient history, and epidemiological information. The expected result is Negative. Fact Sheet for Patients: SugarRoll.be Fact Sheet for Healthcare Providers: https://www.woods-mathews.com/ This test is not yet approved or cleared by the [place] FDA and  has been authorized for detection and/or diagnosis of SARS-CoV-2 by FDA under an Emergency Use Authorization (EUA). This EUA will remain  in effect (meaning this test can be used) for the duration of the COVID-19 declaration under Section 56 4(b)(1) of the Act, 21 U.S.C. section 360bbb-3(b)(1), unless the authorization is terminated or revoked sooner. Performed at [facility], [place] [address] [place], [place] [address]       Radiology Studies: CT ANGIO HEAD W OR WO CONTRAST  Result Date: 08/30/2019 CLINICAL DATA:  Stroke follow-up EXAM: CT ANGIOGRAPHY HEAD AND NECK TECHNIQUE: Multidetector CT imaging of the head and neck was performed using the standard protocol during bolus administration of intravenous contrast. Multiplanar CT image reconstructions and MIPs were obtained to evaluate the vascular anatomy. Carotid stenosis measurements (when applicable) are obtained utilizing NASCET criteria, using the distal internal carotid diameter as the denominator. CONTRAST:  145mL OMNIPAQUE IOHEXOL 350 MG/ML SOLN COMPARISON:  Brain MRI 08/29/2019 FINDINGS: CTA NECK FINDINGS SKELETON: There is no bony spinal canal stenosis. No lytic or blastic lesion. OTHER NECK: Numerous bilateral cervical and supraclavicular lymph nodes. The largest are in the supraclavicular fossa and measure up to 9 mm. UPPER  CHEST: There is scarring in the suprahilar left lung. AORTIC ARCH: There is minimal calcific atherosclerosis of the aortic arch. There is no aneurysm, dissection or hemodynamically significant stenosis of the visualized portion of the aorta. Conventional 3 vessel aortic branching pattern. The visualized proximal subclavian arteries are widely patent. RIGHT CAROTID SYSTEM: Normal without aneurysm, dissection or stenosis. LEFT CAROTID SYSTEM: Normal without aneurysm, dissection or stenosis. VERTEBRAL ARTERIES: Codominant configuration. Both origins are clearly patent. There is no dissection, occlusion or flow-limiting stenosis to the skull base (V1-V3 segments). CTA HEAD FINDINGS POSTERIOR CIRCULATION: --Vertebral arteries: Normal V4 segments. --Posterior inferior cerebellar arteries (PICA): Patent origins from the vertebral arteries. --Anterior inferior cerebellar arteries (AICA): Patent origins from the basilar artery. --Basilar artery: Normal. --Superior cerebellar arteries: Normal. --Posterior cerebral arteries: Normal. Both originate from the basilar artery. Posterior communicating arteries (p-comm) are diminutive or absent. ANTERIOR CIRCULATION: --Intracranial internal carotid arteries: Normal. --Anterior cerebral arteries (ACA): Normal. Both A1 segments are present. Patent anterior communicating artery (a-comm). --Middle cerebral arteries (MCA): Normal. VENOUS SINUSES: As permitted by contrast timing, patent. ANATOMIC VARIANTS: None Review of the MIP images confirms the above findings. IMPRESSION: 1. No emergent large vessel occlusion or high-grade stenosis of the intracranial arteries. 2. Numerous bilateral cervical and supraclavicular lymph nodes. The largest are in the supraclavicular fossa and measure up to 9 mm, compatible with known lymphoma. Electronically Signed   By: Ulyses Jarred M.D.   On: 08/30/2019 03:03   CT HEAD WO CONTRAST  Result Date: 08/29/2019 CLINICAL DATA:  64 year old male with focal  neurologic deficit. EXAM: CT HEAD WITHOUT CONTRAST TECHNIQUE: Contiguous axial images were obtained from the base of the skull through the vertex without intravenous contrast. COMPARISON:  None. FINDINGS: Brain: The ventricles and sulci appropriate size for patient's age. The gray-white matter discrimination is preserved. There is no acute intracranial hemorrhage. No mass effect or midline shift. No extra-axial fluid collection. Vascular: No hyperdense vessel or unexpected calcification. Skull: Normal. Negative for fracture or focal lesion. Sinuses/Orbits: Mild mucoperiosteal thickening of paranasal sinuses. The mastoid air cells are clear. Other: Prior fixation of left zygomatic fracture. IMPRESSION: Unremarkable noncontrast CT of the brain. Electronically Signed   By: Anner Crete M.D.   On: 08/29/2019 19:58   CT ANGIO NECK W OR WO CONTRAST  Result Date: 08/30/2019 CLINICAL DATA:  Stroke follow-up EXAM: CT ANGIOGRAPHY HEAD AND NECK TECHNIQUE: Multidetector CT imaging of the head and neck was performed using the standard protocol during bolus administration of intravenous contrast. Multiplanar CT image reconstructions and MIPs were obtained to evaluate the vascular anatomy. Carotid stenosis measurements (when applicable) are obtained utilizing NASCET criteria, using the distal  internal carotid diameter as the denominator. CONTRAST:  125mL OMNIPAQUE IOHEXOL 350 MG/ML SOLN COMPARISON:  Brain MRI 08/29/2019 FINDINGS: CTA NECK FINDINGS SKELETON: There is no bony spinal canal stenosis. No lytic or blastic lesion. OTHER NECK: Numerous bilateral cervical and supraclavicular lymph nodes. The largest are in the supraclavicular fossa and measure up to 9 mm. UPPER CHEST: There is scarring in the suprahilar left lung. AORTIC ARCH: There is minimal calcific atherosclerosis of the aortic arch. There is no aneurysm, dissection or hemodynamically significant stenosis of the visualized portion of the aorta. Conventional 3  vessel aortic branching pattern. The visualized proximal subclavian arteries are widely patent. RIGHT CAROTID SYSTEM: Normal without aneurysm, dissection or stenosis. LEFT CAROTID SYSTEM: Normal without aneurysm, dissection or stenosis. VERTEBRAL ARTERIES: Codominant configuration. Both origins are clearly patent. There is no dissection, occlusion or flow-limiting stenosis to the skull base (V1-V3 segments). CTA HEAD FINDINGS POSTERIOR CIRCULATION: --Vertebral arteries: Normal V4 segments. --Posterior inferior cerebellar arteries (PICA): Patent origins from the vertebral arteries. --Anterior inferior cerebellar arteries (AICA): Patent origins from the basilar artery. --Basilar artery: Normal. --Superior cerebellar arteries: Normal. --Posterior cerebral arteries: Normal. Both originate from the basilar artery. Posterior communicating arteries (p-comm) are diminutive or absent. ANTERIOR CIRCULATION: --Intracranial internal carotid arteries: Normal. --Anterior cerebral arteries (ACA): Normal. Both A1 segments are present. Patent anterior communicating artery (a-comm). --Middle cerebral arteries (MCA): Normal. VENOUS SINUSES: As permitted by contrast timing, patent. ANATOMIC VARIANTS: None Review of the MIP images confirms the above findings. IMPRESSION: 1. No emergent large vessel occlusion or high-grade stenosis of the intracranial arteries. 2. Numerous bilateral cervical and supraclavicular lymph nodes. The largest are in the supraclavicular fossa and measure up to 9 mm, compatible with known lymphoma. Electronically Signed   By: Ulyses Jarred M.D.   On: 08/30/2019 03:03   MR BRAIN WO CONTRAST  Result Date: 08/30/2019 CLINICAL DATA:  Stroke, follow-up, research study EXAM: MRI HEAD WITHOUT CONTRAST TECHNIQUE: Multiplanar, multiecho pulse sequences of the brain and surrounding structures were obtained without intravenous contrast. COMPARISON:  08/29/2019 FINDINGS: Brain: Reduced diffusion is again identified in the  right paramedian pons. Extent of abnormal signal appears slightly increased. There is no evidence of intracranial hemorrhage. Patchy T2 hyperintensity in the supratentorial white matter likely reflects stable chronic microvascular ischemic changes. Vascular: Major vessel flow voids at the skull base are preserved. Skull and upper cervical spine: Normal marrow signal is preserved. Sinuses/Orbits: Minor mucosal thickening. There is susceptibility artifact along the left orbit from fixation hardware. Other: Sella is unremarkable.  Mastoid air cells are clear. IMPRESSION: Evolving recent right paramedian pontine infarction with slight increase in extent of abnormal signal. No hemorrhage. Electronically Signed   By: Macy Mis M.D.   On: 08/30/2019 20:34   MR BRAIN WO CONTRAST  Result Date: 08/29/2019 CLINICAL DATA:  Awoke this morning with gait disturbance and slurred speech. EXAM: MRI HEAD WITHOUT CONTRAST TECHNIQUE: Multiplanar, multiecho pulse sequences of the brain and surrounding structures were obtained without intravenous contrast. COMPARISON:  Head CT same day. FINDINGS: Brain: Acute infarction affecting the right para median pons. No other acute brain insult. The brain is otherwise normal without atrophy, old or acute infarction elsewhere, mass lesion, hemorrhage, hydrocephalus or extra-axial collection. Axial T2 imaging was omitted. Vascular: Major vessels at the base of the brain show flow. Skull and upper cervical spine: Negative Sinuses/Orbits: Clear Other: None IMPRESSION: Acute infarction of the right para median pons. Otherwise normal brain MRI. Electronically Signed   By: Jan Fireman.D.  On: 08/29/2019 21:11   DG Chest Port 1 View  Result Date: 08/29/2019 CLINICAL DATA:  Slurred speech and stroke-like symptoms EXAM: PORTABLE CHEST 1 VIEW COMPARISON:  CT from the previous day. FINDINGS: Cardiac shadow is stable. The lungs are well aerated bilaterally. Persistent right basilar density is  noted medially similar to that seen on prior CT examination. No bony abnormality is noted. IMPRESSION: Stable right lower lobe infiltrate. Electronically Signed   By: Inez Catalina M.D.   On: 08/29/2019 22:31   ECHOCARDIOGRAM COMPLETE  Result Date: 08/30/2019   ECHOCARDIOGRAM REPORT   Patient Name:   [name] [name] Baylock Date of Exam: 08/30/2019 Medical Rec #:  [id number]     Height:       67.0 in Accession #:    [id number]    Weight:       150.0 lb Date of Birth:  [birth year]     BSA:          1.79 m Patient Age:    18 years      BP:           141/71 mmHg Patient Gender: M             HR:           74 bpm. Exam Location:  Inpatient Procedure: 2D Echo Indications:    stroke 434.91  History:        Patient has no prior history of Echocardiogram examinations.                 Risk Factors:Diabetes and Hypertension.  Sonographer:    Johny Chess Referring Phys: Playita Cortada  1. Left ventricular ejection fraction, by visual estimation, is 60 to 65%. The left ventricle has normal function. There is moderately increased left ventricular hypertrophy.  2. Global right ventricle has normal systolic function.The right ventricular size is normal.  3. Left atrial size was normal.  4. Right atrial size was normal.  5. The mitral valve is normal in structure. Trivial mitral valve regurgitation.  6. The tricuspid valve is normal in structure. Mild regurgitation.  7. The aortic valve is tricuspid. Aortic valve regurgitation is not visualized. No evidence of aortic valve sclerosis or stenosis.  8. The pulmonic valve was not well visualized. Pulmonic valve regurgitation is not visualized.  9. The inferior vena cava is normal in size with greater than 50% respiratory variability, suggesting right atrial pressure of 3 mmHg. 10. The tricuspid regurgitant velocity is 2.65 m/s, and with an assumed right atrial pressure of 3 mmHg, the estimated right ventricular systolic pressure is mildly elevated at 31.1 mmHg. FINDINGS   Left Ventricle: Left ventricular ejection fraction, by visual estimation, is 60 to 65%. The left ventricle has normal function. The left ventricle has no regional wall motion abnormalities. There is moderately increased left ventricular hypertrophy. Asymmetric left ventricular hypertrophy. Left ventricular diastolic parameters were normal. Right Ventricle: The right ventricular size is normal. No increase in right ventricular wall thickness. Global RV systolic function is has normal systolic function. The tricuspid regurgitant velocity is 2.65 m/s, and with an assumed right atrial pressure  of 3 mmHg, the estimated right ventricular systolic pressure is mildly elevated at 31.1 mmHg. Left Atrium: Left atrial size was normal in size. Right Atrium: Right atrial size was normal in size Pericardium: There is no evidence of pericardial effusion. Mitral Valve: The mitral valve is normal in structure. Trivial mitral valve regurgitation. Tricuspid Valve: The tricuspid valve is normal  in structure. Tricuspid valve regurgitation is mild. Aortic Valve: The aortic valve is tricuspid. Aortic valve regurgitation is not visualized. The aortic valve is structurally normal, with no evidence of sclerosis or stenosis. Pulmonic Valve: The pulmonic valve was not well visualized. Pulmonic valve regurgitation is not visualized. Pulmonic regurgitation is not visualized. Aorta: The aortic root is normal in size and structure. Venous: The inferior vena cava is normal in size with greater than 50% respiratory variability, suggesting right atrial pressure of 3 mmHg. IAS/Shunts: The atrial septum is grossly normal.  LEFT VENTRICLE PLAX 2D LVIDd:         4.90 cm  Diastology LVIDs:         3.30 cm  LV e' lateral:   13.10 cm/s LV PW:         1.10 cm  LV E/e' lateral: 5.1 LV IVS:        0.90 cm  LV e' medial:    8.16 cm/s LVOT diam:     2.10 cm  LV E/e' medial:  8.2 LV SV:         69 ml LV SV Index:   38.18 LVOT Area:     3.46 cm  RIGHT VENTRICLE  RV S prime:     13.10 cm/s TAPSE (M-mode): 2.6 cm LEFT ATRIUM           Index       RIGHT ATRIUM           Index LA diam:      3.50 cm 1.96 cm/m  RA Area:     13.80 cm LA Vol (A2C): 47.2 ml 26.38 ml/m RA Volume:   34.10 ml  19.06 ml/m LA Vol (A4C): 45.4 ml 25.37 ml/m  AORTIC VALVE LVOT Vmax:   132.00 cm/s LVOT Vmean:  81.200 cm/s LVOT VTI:    0.268 m  AORTA Ao Root diam: 3.20 cm MITRAL VALVE                        TRICUSPID VALVE MV Area (PHT): 2.87 cm             TR Peak grad:   28.1 mmHg MV PHT:        76.56 msec           TR Vmax:        265.00 cm/s MV Decel Time: 264 msec MV E velocity: 67.30 cm/s 103 cm/s  SHUNTS MV A velocity: 72.00 cm/s 70.3 cm/s Systemic VTI:  0.27 m MV E/A ratio:  0.93       1.5       Systemic Diam: 2.10 cm  Oswaldo Milian MD Electronically signed by Oswaldo Milian MD Signature Date/Time: 08/30/2019/4:58:42 PM    Final      Scheduled Meds: .  stroke: mapping our early stages of recovery book   Does not apply Once  . atorvastatin  20 mg Oral Daily  . clopidogrel  75 mg Oral Daily  . enoxaparin (LOVENOX) injection  40 mg Subcutaneous Q24H  . insulin aspart  0-9 Units Subcutaneous TID WC  . BAY [id number] or placebo ([place])  1 tablet Oral Daily   And  . BAY [id number] or placebo ([place])  1 tablet Oral Daily   Continuous Infusions: . sodium chloride 100 mL/hr (08/31/19 0438)     LOS: 2 days   Time Spent in minutes   30 minutes  [name] [name] D.O. on 08/31/2019 at 9:33 AM  Between 7am to 7pm - Please see pager noted on [email or web address]  After 7pm go to [email or web address]  And look for the night coverage person covering for me after hours  [facility] Office  [phone]

## 2019-08-31 NOTE — Evaluation (Signed)
Occupational Therapy Evaluation Patient Details Name: [name] [name] MRN: [id number] DOB: 26-Oct-1955 Today's Date: 08/31/2019    History of Present Illness Pt is a 64 y/o male admitted to ED on 1/8 with staggering gait, L hemiparesis, dysarthria, and fall due to weakness. 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 Pt with CLL with recent CT chest, abdomen, and pelvis revealing improving lymphadenopathy. Other notable PMH includes DM, sleep apnea, nasal septoplasty 04/2018.   Clinical Impression   PTA patient independent. Admitted for above and limited by problem list below, including L hemiparesis, impaired balance, decreased activity tolerance, and impaired cognition.  He requires min-mod assist for UB ADLs, max-total assist for LB ADLs, mod assist +2 for transfers and min assist for grooming. Patient requires increased time for processing and cueing for problem solving.  Patient educated on compensatory techniques for ADLs, support to L UE to protect shoulder. He reports diplopia initially then fades during testing, will continue assessment.  Patient will benefit from intensive CIR level rehab in order to optimize independence and return to PLOF with ADls, mobility.     Follow Up Recommendations  CIR;Supervision/Assistance - 24 hour    Equipment Recommendations  Other (comment)(TBD at next venue of care)    Recommendations for Other Services       Precautions / Restrictions Precautions Precautions: Fall Precaution Comments: L hemiparesis Restrictions Weight Bearing Restrictions: No      Mobility Bed Mobility Overal bed mobility: Needs Assistance Bed Mobility: Supine to Sit     Supine to sit: HOB elevated;Min assist     General bed mobility comments: pt achieving upright sitting position towards his R side, assistance for L LE to move fully off of bed with use of bed pads to position pt's hips at EOB  Transfers Overall transfer level: Needs  assistance Equipment used: 2 person hand held assist Transfers: Sit to/from Stand;Stand Pivot Transfers Sit to Stand: From elevated surface;Min assist;+2 physical assistance Stand pivot transfers: Mod assist;+2 physical assistance       General transfer comment: min assist +2 to power up and steady from elevated EOB, cueing for hand placement and technique with PT blocking L knee; pivotal steps towards R side with mod assist +2     Balance Overall balance assessment: Needs assistance Sitting-balance support: Feet supported;No upper extremity supported Sitting balance-Leahy Scale: Fair Sitting balance - Comments: static sitting balance, preference to R UE support at EOB    Standing balance support: Bilateral upper extremity supported;During functional activity Standing balance-Leahy Scale: Poor Standing balance comment: mod-max A                           ADL either performed or assessed with clinical judgement   ADL Overall ADL's : Needs assistance/impaired     Grooming: Minimal assistance;Sitting   Upper Body Bathing: Moderate assistance;Sitting   Lower Body Bathing: Maximal assistance;+2 for physical assistance;+2 for safety/equipment;Sit to/from stand   Upper Body Dressing : Moderate assistance;Sitting   Lower Body Dressing: Moderate assistance;Maximal assistance;Sit to/from stand;+2 for physical assistance   Toilet Transfer: Moderate assistance;Ambulation;+2 for physical assistance;+2 for safety/equipment           Functional mobility during ADLs: Minimal assistance;Moderate assistance;+2 for safety/equipment;+2 for physical assistance;Cueing for sequencing;Cueing for safety General ADL Comments: pt limited by L hemiparesis, impaired balance, decreased activity tolerance and impaired cognition     Vision Baseline Vision/History: Wears glasses Wears Glasses: Reading only Patient  Visual Report: Other (comment)(intermittent diplopia) Vision Assessment?:  Yes Eye Alignment: Within Functional Limits Ocular Range of Motion: Within Functional Limits Alignment/Gaze Preference: Within Defined Limits Tracking/Visual Pursuits: Able to track stimulus in all quads without difficulty Visual Fields: No apparent deficits Diplopia Assessment: (initally reports object double infront the other, then fades) Depth Perception: (appears [facility] ) Additional Comments: reports diplopia initally then fades during testing, continue to assess      Perception     Praxis      Pertinent Vitals/Pain Pain Assessment: No/denies pain     Hand Dominance Right   Extremity/Trunk Assessment Upper Extremity Assessment Upper Extremity Assessment: LUE deficits/detail LUE Deficits / Details: flaccid, trace noted in shoulder elevation but unable to use UE functionally LUE Sensation: (reports WFL, but then voicing "it feel numb" ) LUE Coordination: decreased fine motor;decreased gross motor   Lower Extremity Assessment Lower Extremity Assessment: Defer to PT evaluation       Communication Communication Communication: No difficulties   Cognition Arousal/Alertness: Awake/alert Behavior During Therapy: Flat affect Overall Cognitive Status: Impaired/Different from baseline Area of Impairment: Problem solving;Following commands                       Following Commands: Follows one step commands consistently;Follows multi-step commands with increased time     Problem Solving: Slow processing;Requires verbal cues;Requires tactile cues;Difficulty sequencing General Comments: patient with slow processing and min-mod cueing for problem sovling    General Comments       Exercises     Shoulder Instructions      Home Living Family/patient expects to be discharged to:: Private residence Living Arrangements: Parent Available Help at Discharge: Family Type of Home: House Home Access: Ramped entrance     Home Layout: One level     Bathroom Shower/Tub:  Occupational psychologist: Standard     Home Equipment: None          Prior Functioning/Environment Level of Independence: Independent        Comments: pt reports being retired from working for [employer], assists his 74 y/o mother as needed. Pt's mother has an aide that assists her in the am for a couple of hours.        OT Problem List: Decreased strength;Decreased activity tolerance;Impaired balance (sitting and/or standing);Impaired vision/perception;Decreased coordination;Decreased range of motion;Decreased cognition;Decreased safety awareness;Decreased knowledge of use of DME or AE;Decreased knowledge of precautions      OT Treatment/Interventions: Self-care/ADL training;Neuromuscular education;DME and/or AE instruction;Therapeutic activities;Cognitive remediation/compensation;Balance training;Patient/family education;Visual/perceptual remediation/compensation    OT Goals(Current goals can be found in the care plan section) Acute Rehab OT Goals Patient Stated Goal: return to independence OT Goal Formulation: With patient Time For Goal Achievement: 09/14/19 Potential to Achieve Goals: Good  OT Frequency: Min 2X/week   Barriers to D/C:            Co-evaluation PT/OT/SLP Co-Evaluation/Treatment: Yes Reason for Co-Treatment: To address functional/ADL transfers;For patient/therapist safety PT goals addressed during session: Strengthening/ROM;Balance;Mobility/safety with mobility OT goals addressed during session: ADL's and self-care      AM-PAC OT "6 Clicks" Daily Activity     Outcome Measure Help from another person eating meals?: A Little Help from another person taking care of personal grooming?: A Little Help from another person toileting, which includes using toliet, bedpan, or urinal?: A Lot Help from another person bathing (including washing, rinsing, drying)?: A Lot Help from another person to put on and taking off regular upper body  clothing?: A Lot Help from another person to put on and taking off regular lower body clothing?: Total 6 Click Score: 13   End of Session Equipment Utilized During Treatment: Gait belt Nurse Communication: Mobility status  Activity Tolerance: Patient tolerated treatment well Patient left: in chair;with call bell/phone within reach  OT Visit Diagnosis: Other abnormalities of gait and mobility (R26.89);Muscle weakness (generalized) (M62.81);Hemiplegia and hemiparesis Hemiplegia - Right/Left: Left Hemiplegia - dominant/non-dominant: Non-Dominant Hemiplegia - caused by: Cerebral infarction                [id number] OT Time Calculation (min): 30 min Charges:  OT General Charges $OT Visit: 1 Visit OT Evaluation $OT Eval Moderate Complexity: 1 Mod  Jolaine Artist, OT Acute Rehabilitation Services Pager [phone] Office [phone]   Delight Stare 08/31/2019, 1:53 PM

## 2019-08-31 NOTE — Progress Notes (Signed)
Physical Therapy Treatment Patient Details Name: [name] [name] MRN: [id number] DOB: 03/25/1956 Today's Date: 08/31/2019    History of Present Illness Pt is a 64 y/o male admitted to ED on 1/8 with staggering gait, L hemiparesis, dysarthria, and fall due to weakness. 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 Pt with CLL with recent CT chest, abdomen, and pelvis revealing improving lymphadenopathy. Other notable PMH includes DM, sleep apnea, nasal septoplasty 04/2018.    PT Comments    Pt making good progress since previous session. He required less physical assistance for bed mobility and transfers. Additionally he was able to pivot to the recliner chair with mod A x2. He still demonstrates significant weakness on the L side. Continue to recommend further intensive therapy services at CIR to maximize pt's independence with functional mobility prior to returning home. PT will continue to follow pt acutely to progress mobility as tolerated per PT POC.    Follow Up Recommendations  CIR     Equipment Recommendations  Other (comment)(defer to next venue of care)    Recommendations for Other Services       Precautions / Restrictions Precautions Precautions: Fall Precaution Comments: L hemiparesis Restrictions Weight Bearing Restrictions: No    Mobility  Bed Mobility Overal bed mobility: Needs Assistance Bed Mobility: Supine to Sit     Supine to sit: HOB elevated;Min assist     General bed mobility comments: pt achieving upright sitting position towards his R side, assistance for L LE to move fully off of bed with use of bed pads to position pt's hips at EOB  Transfers Overall transfer level: Needs assistance Equipment used: 2 person hand held assist Transfers: Sit to/from Stand;Stand Pivot Transfers Sit to Stand: From elevated surface;Min assist;+2 physical assistance Stand pivot transfers: Mod assist;+2 physical assistance       General  transfer comment: pt with good initiation of standing with adequate anterior weight shift, therapist blocking L LE to prevent knee buckling, min A x2 to power into standing from EOB, mod A to pivot to recliner chair towards pt's R side. Cueing for technique and sequencing of LEs with pivotal movement. L knee very unstable throughout in standing  Ambulation/Gait                 Stairs             Wheelchair Mobility    Modified Rankin (Stroke Patients Only) Modified Rankin (Stroke Patients Only) Pre-Morbid Rankin Score: No symptoms Modified Rankin: Moderately severe disability     Balance Overall balance assessment: Needs assistance Sitting-balance support: Feet supported;No upper extremity supported Sitting balance-Leahy Scale: Good     Standing balance support: During functional activity;Bilateral upper extremity supported;Single extremity supported Standing balance-Leahy Scale: Poor Standing balance comment: mod-max A                            Cognition Arousal/Alertness: Awake/alert Behavior During Therapy: Flat affect Overall Cognitive Status: Impaired/Different from baseline Area of Impairment: Problem solving;Following commands                       Following Commands: Follows one step commands consistently;Follows multi-step commands with increased time     Problem Solving: Slow processing;Requires verbal cues;Requires tactile cues;Difficulty sequencing        Exercises      General Comments        Pertinent Vitals/Pain Pain  Assessment: No/denies pain Faces Pain Scale: No hurt    Home Living     Available Help at Discharge: Family Type of Home: House              Prior Function            PT Goals (current goals can now be found in the care plan section) Acute Rehab PT Goals PT Goal Formulation: With patient Time For Goal Achievement: 09/13/19 Potential to Achieve Goals: Good Progress towards PT goals:  Progressing toward goals    Frequency    Min 4X/week      PT Plan Current plan remains appropriate    Co-evaluation PT/OT/SLP Co-Evaluation/Treatment: Yes Reason for Co-Treatment: For patient/therapist safety;To address functional/ADL transfers PT goals addressed during session: Strengthening/ROM;Balance;Mobility/safety with mobility        AM-PAC PT "6 Clicks" Mobility   Outcome Measure  Help needed turning from your back to your side while in a flat bed without using bedrails?: None Help needed moving from lying on your back to sitting on the side of a flat bed without using bedrails?: A Little Help needed moving to and from a bed to a chair (including a wheelchair)?: A Lot Help needed standing up from a chair using your arms (e.g., wheelchair or bedside chair)?: A Lot Help needed to walk in hospital room?: A Lot Help needed climbing 3-5 steps with a railing? : Total 6 Click Score: 14    End of Session Equipment Utilized During Treatment: Gait belt Activity Tolerance: Patient tolerated treatment well Patient left: in chair;with call bell/phone within reach Nurse Communication: Mobility status PT Visit Diagnosis: Hemiplegia and hemiparesis;Muscle weakness (generalized) (M62.81) Hemiplegia - Right/Left: Left Hemiplegia - dominant/non-dominant: Non-dominant Hemiplegia - caused by: Cerebral infarction     [id number] PT Time Calculation (min) (ACUTE ONLY): 23 min  Charges:  $Therapeutic Activity: 8-22 mins                     Anastasio Champion, DPT  Acute Rehabilitation Services Pager [phone] Office [place] 08/31/2019, 12:32 PM

## 2019-08-31 NOTE — Evaluation (Signed)
Speech Language Pathology Evaluation Patient Details Name: [name] [name] MRN: [id number] DOB: 05/30/1956 Today's Date: 08/31/2019 Time: 0902-0930 SLP Time Calculation (min) (ACUTE ONLY): 28 min  Problem List:  Patient Active Problem List   Diagnosis Date Noted  . Acute ischemic stroke ([facility]) 08/29/2019  . Hyponatremia 08/29/2019  . HTN (hypertension) 08/29/2019  . DM2 (diabetes mellitus, type 2) ([place]) 08/29/2019  . CLL (chronic lymphocytic leukemia) ([facility]) 04/10/2019  . Counseling regarding advance care planning and goals of care 04/10/2019  . Deviated septum 05/18/2018  . Iron deficiency anemia 02/26/2018   Past Medical History:  Past Medical History:  Diagnosis Date  . Allergic rhinitis, cause unspecified   . Backache, unspecified   . Body mass index 33.0-33.9, adult   . Diabetes mellitus without complication ([place])    Type II  . Elevated blood pressure reading without diagnosis of hypertension   . Esophageal reflux   . Generalized pain   . Heartburn   . Insomnia, unspecified   . 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 Other abnormal blood chemistry   . Other and unspecified hyperlipidemia   . Other dyspnea and respiratory abnormality   . Other nonspecific findings on examination of blood(790.99)   . Pain in joint, lower leg   . Pneumonia    1968  . Sleep apnea    Does not use or own a CPAP  . Tobacco use disorder   . Unspecified essential hypertension    Past Surgical History:  Past Surgical History:  Procedure Laterality Date  . bil wrist surgery    . ENDOSCOPIC CONCHA BULLOSA RESECTION Bilateral 05/18/2018   Procedure: ENDOSCOPIC CONCHA BULLOSA RESECTION;  Surgeon: Jerrell Belfast, MD;  Location: [facility];  Service: ENT;  Laterality: Bilateral;  . NASAL SEPTOPLASTY W/ TURBINOPLASTY Bilateral 05/18/2018   Procedure: NASAL SEPTOPLASTY WITH TURBINATE REDUCTION;  Surgeon: Jerrell Belfast, MD;  Location: [place];  Service: ENT;   Laterality: Bilateral;  . ORIF FOREARM FRACTURE     right  . plastic surgery to face    . SINUS ENDO W/FUSION Bilateral 05/18/2018   Procedure: ENDOSCOPIC SINUS SURGERY WITH NAVIGATION;  Surgeon: Jerrell Belfast, MD;  Location: [place];  Service: ENT;  Laterality: Bilateral;   HPI:  64 yo male admitted to ED on 1/8 with staggering gait, L hemiparesis, dysarthria, and fall due to weakness. 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 Pt with CLL with recent CT chest, abdomen, and pelvis revealing improving lymphadenopathy. Other notable PMH includes DM, sleep apnea, nasal septoplasty 04/2018.   Assessment / Plan / Recommendation Clinical Impression  Pt has slow processing with decreased complex problem solving. His immediate and delayed recall were appropriate during evaluation tasks, but he trouble with working memory at times. Pt would routinely go back and check his work, often showing emergent awareness that he was having trouble but not able to always correct his errors. His speech is mildly dysarthric but overall intelligible at the conversational level. Pt would benefit from CIR to maximize cognitive skills and motor speech given his independence PTA.     SLP Assessment  SLP Recommendation/Assessment: Patient needs continued Speech Lanaguage Pathology Services SLP Visit Diagnosis: Dysarthria and anarthria (R47.1);Cognitive communication deficit (R41.841)    Follow Up Recommendations  Inpatient Rehab    Frequency and Duration min 2x/week  2 weeks      SLP Evaluation Cognition  Overall Cognitive Status: Impaired/Different from baseline Arousal/Alertness: Awake/alert Orientation Level: Oriented  X4 Attention: Selective Selective Attention: Appears intact Memory: Impaired Memory Impairment: Other (comment)(working memory) Awareness: Appears intact Problem Solving: Impaired Problem Solving Impairment: Verbal complex       Comprehension   Auditory Comprehension Overall Auditory Comprehension: Appears within functional limits for tasks assessed    Expression Expression Primary Mode of Expression: Verbal Verbal Expression Overall Verbal Expression: Appears within functional limits for tasks assessed   Oral / Motor  Motor Speech Overall Motor Speech: Impaired Respiration: Within functional limits Phonation: Normal Resonance: Within functional limits Articulation: Impaired Level of Impairment: Conversation Intelligibility: Intelligible   GO                    Osie Bond., M.A. [place] Acute Rehabilitation Services Pager [phone] Office [phone]  08/31/2019, 10:23 AM

## 2019-09-01 LAB — BASIC METABOLIC PANEL
Anion gap: 10 (ref 5–15)
BUN: 7 mg/dL — ABNORMAL LOW (ref 8–23)
CO2: 22 mmol/L (ref 22–32)
Calcium: 9.3 mg/dL (ref 8.9–10.3)
Chloride: 98 mmol/L (ref 98–111)
Creatinine, Ser: 0.88 mg/dL (ref 0.61–1.24)
GFR calc Af Amer: >60 mL/min (ref >60)
GFR calc non Af Amer: >60 mL/min (ref >60)
Glucose, Bld: 109 mg/dL — ABNORMAL HIGH (ref 70–99)
Potassium: 3.6 mmol/L (ref 3.5–5.1)
Sodium: 130 mmol/L — ABNORMAL LOW (ref 135–145)

## 2019-09-01 LAB — HEMOGLOBIN AND HEMATOCRIT, BLOOD
HCT: 27.2 % — ABNORMAL LOW (ref 39.0–52.0)
Hemoglobin: 9 g/dL — ABNORMAL LOW (ref 13.0–17.0)

## 2019-09-01 LAB — GLUCOSE, CAPILLARY
Glucose-Capillary: 104 mg/dL — ABNORMAL HIGH (ref 70–99)
Glucose-Capillary: 131 mg/dL — ABNORMAL HIGH (ref 70–99)
Glucose-Capillary: 106 mg/dL — ABNORMAL HIGH (ref 70–99)
Glucose-Capillary: 112 mg/dL — ABNORMAL HIGH (ref 70–99)

## 2019-09-01 MED ORDER — ZOLPIDEM TARTRATE 5 MG PO TABS
5.0000 mg | ORAL_TABLET | Freq: Once | ORAL | Status: AC
  Administered 2019-09-01: 5 mg via ORAL
  Filled 2019-09-01: qty 1

## 2019-09-01 MED ORDER — ZOLPIDEM TARTRATE 5 MG PO TABS
10.0000 mg | ORAL_TABLET | Freq: Every evening | ORAL | Status: DC | PRN
  Administered 2019-09-02 – 2019-09-03 (×2): 10 mg via ORAL
  Filled 2019-09-01 (×2): qty 2

## 2019-09-01 NOTE — Progress Notes (Signed)
PROGRESS NOTE    [name] [name]  [id number] DOB: [birth year] DOA: 08/29/2019 PCP: Bernerd Limbo, MD   Brief Narrative:  HPI on 08/29/2019 by Dr. Jennette Kettle [name] [name] is a 64 y.o. male with medical history significant of CLL / small B cell lymphoma, DM2, HTN. Patient with L sided weakness in upper and lower extremities onset yesterday.  Unclear if patients symptoms changed throughout day, but they are persistent. No confusion, no fever, no SOB.  Interim history  Admitted for Acute CVA. Neuro consulted. Now pending CIR.  Assessment & Plan   Acute ischemic CVA -CT head was unremarkable -MRI brain: Acute infarction of the right paramedian pons -Echocardiogram EF 60-65% -LDL 85 hemoglobin A1c 5.9 -PT recommended CIR -Inpatient rehab consulted and appreciated -Neurology consulted and appreciated, placed on plavix and enrolled in Yaak Factor XI inhibitor stroke prevention trial -Continue statin, plavix  Hyponatremia, chronic -Review of chart shows that patient did have a low sodium in Sep through November 2020 -sodium 130 -Continue IV fluids -Monitor BMP  Hyperkalemia -resolved, continue to monitor BMP  CLL/small B-cell lymphoma -Last chemotherapy appears to occurred in October 2020 -Patient sees Dr. Claiborne Billings  Diabetes mellitus, type II -Metfomin held -A1c 5.9 -Continue insulin sliding scale CBG monitoring  Essential Hypertension -Allow for permissive hypertension given acute CVA -BP stable  DVT Prophylaxis Lovenox  Code Status: Full  Family Communication: None at bedside  Disposition Plan: Admitted.  Pending CIR consultation and placement  Consultants Neurology Inpatient rehab  Procedures  Echocardiogram  Antibiotics   Anti-infectives (From admission, onward)   None      Subjective:   [name] seen and examined today.  Patient with no complaints this morning.  Denies chest pain, shortness of breath, abdominal pain, nausea or vomiting,  diarrhea constipation, dizziness or headache. Objective:   Vitals:   08/31/19 2005 09/01/19 0005 09/01/19 0400 09/01/19 0837  BP: 138/79 138/84 (!) 152/83 137/70  Pulse: 71 69 66 67  Resp: 18 18 18 18   Temp: 99.3 F (37.4 C) 98.3 F (36.8 C) 99.4 F (37.4 C) 98.2 F (36.8 C)  TempSrc: Oral Oral Oral Oral  SpO2: 100% 100% 100% 98%  Weight:      Height:        Intake/Output Summary (Last 24 hours) at 09/01/2019 1006 Last data filed at 09/01/2019 0827 Gross per 24 hour  Intake 3427.38 ml  Output 3107 ml  Net 320.38 ml   Filed Weights   08/29/19 1923  Weight: 68 kg   Exam  General: Well developed, well nourished, NAD, appears stated age  HEENT: NCAT,  mucous membranes moist.   Cardiovascular: S1 S2 auscultated, RRR  Respiratory: Clear to auscultation bilaterally  Abdomen: Soft, nontender, nondistended, + bowel sounds  Extremities: warm dry without cyanosis clubbing or edema  Neuro: AAOx3, left-sided weakness, 2-3/5.    Psych: Propria mood and affect, pleasant  Data Reviewed: I have personally reviewed following labs and imaging studies  CBC: Recent Labs  Lab 08/27/19 0845 08/29/19 1931 08/29/19 1947 08/29/19 2004 08/31/19 0402 09/01/19 0201  WBC 4.1 4.3  --   --  3.7*  --   NEUTROABS 2.6 3.2  --   --   --   --   HGB 9.7* 9.8* 9.9* 10.2* 9.3* 9.0*  HCT 28.8* 28.8* 29.0* 30.0* 27.3* 27.2*  MCV 71.5* 73.1*  --   --  72.2*  --   PLT 313 310  --   --  362  --  Basic Metabolic Panel: Recent Labs  Lab 08/27/19 0845 08/29/19 1931 08/29/19 1947 08/29/19 2004 08/31/19 0402 09/01/19 0201  NA 129* 128* 128* 126* 129* 130*  K 3.6 4.1 4.0 5.4* 3.5 3.6  CL 96* 94* 94* 95* 97* 98  CO2 24 23  --   --  22 22  GLUCOSE 169* 115* 111* 108* 145* 109*  BUN 10 8 9 11  7* 7*  CREATININE 0.97 0.86 0.80 0.80 0.99 0.88  CALCIUM 9.4 9.7  --   --  8.8* 9.3  MG 1.8  --   --   --   --   --    GFR: Estimated Creatinine Clearance: 81.4 mL/min (by C-G formula based on  SCr of 0.88 mg/dL). Liver Function Tests: Recent Labs  Lab 08/27/19 0845 08/29/19 1931  AST 14* 18  ALT 8 8  ALKPHOS 59 61  BILITOT 1.7* 1.3*  PROT 7.0 7.0  ALBUMIN 3.9 4.0   No results for input(s): LIPASE, AMYLASE in the last 168 hours. No results for input(s): AMMONIA in the last 168 hours. Coagulation Profile: Recent Labs  Lab 08/29/19 1931  INR 1.0   Cardiac Enzymes: No results for input(s): CKTOTAL, CKMB, CKMBINDEX, TROPONINI in the last 168 hours. BNP (last 3 results) No results for input(s): PROBNP in the last 8760 hours. HbA1C: Recent Labs    08/29/19 1931  HGBA1C 5.9*   CBG: Recent Labs  Lab 08/31/19 0713 08/31/19 1225 08/31/19 1633 08/31/19 2159 09/01/19 0638  GLUCAP 114* 116* 94 112* 104*   Lipid Profile: Recent Labs    08/30/19 0225  CHOL 131  HDL 36*  LDLCALC 85  TRIG 52  CHOLHDL 3.6   Thyroid Function Tests: No results for input(s): TSH, T4TOTAL, FREET4, T3FREE, THYROIDAB in the last 72 hours. Anemia Panel: No results for input(s): VITAMINB12, FOLATE, FERRITIN, TIBC, IRON, RETICCTPCT in the last 72 hours. Urine analysis:    Component Value Date/Time   COLORURINE YELLOW 08/29/2019 2118   APPEARANCEUR CLEAR 08/29/2019 2118   LABSPEC 1.014 08/29/2019 2118   PHURINE 6.0 08/29/2019 2118   GLUCOSEU NEGATIVE 08/29/2019 2118   HGBUR NEGATIVE 08/29/2019 2118   BILIRUBINUR NEGATIVE 08/29/2019 2118   [place] NEGATIVE 08/29/2019 2118   PROTEINUR NEGATIVE 08/29/2019 2118   NITRITE NEGATIVE 08/29/2019 2118   LEUKOCYTESUR NEGATIVE 08/29/2019 2118   Sepsis Labs: @LABRCNTIP (procalcitonin:4,lacticidven:4)  ) Recent Results (from the past 240 hour(s))  SARS CORONAVIRUS 2 (TAT 6-24 HRS) Nasopharyngeal Nasopharyngeal Swab     Status: None   Collection Time: 08/29/19  9:18 PM   Specimen: Nasopharyngeal Swab  Result Value Ref Range Status   SARS Coronavirus 2 NEGATIVE NEGATIVE Final    Comment: (NOTE) SARS-CoV-2 target nucleic acids are NOT  DETECTED. The SARS-CoV-2 RNA is generally detectable in upper and lower respiratory specimens during the acute phase of infection. Negative results do not preclude SARS-CoV-2 infection, do not rule out co-infections with other pathogens, and should not be used as the sole basis for treatment or other patient management decisions. Negative results must be combined with clinical observations, patient history, and epidemiological information. The expected result is Negative. Fact Sheet for Patients: SugarRoll.be Fact Sheet for Healthcare Providers: https://www.woods-mathews.com/ This test is not yet approved or cleared by the [place] FDA and  has been authorized for detection and/or diagnosis of SARS-CoV-2 by FDA under an Emergency Use Authorization (EUA). This EUA will remain  in effect (meaning this test can be used) for the duration of the COVID-19 declaration under Section  56 4(b)(1) of the Act, 21 U.S.C. section 360bbb-3(b)(1), unless the authorization is terminated or revoked sooner. Performed at [facility], [place] [address], [place], [place] [address]       Radiology Studies: MR BRAIN WO CONTRAST  Result Date: 08/30/2019 CLINICAL DATA:  Stroke, follow-up, research study EXAM: MRI HEAD WITHOUT CONTRAST TECHNIQUE: Multiplanar, multiecho pulse sequences of the brain and surrounding structures were obtained without intravenous contrast. COMPARISON:  08/29/2019 FINDINGS: Brain: Reduced diffusion is again identified in the right paramedian pons. Extent of abnormal signal appears slightly increased. There is no evidence of intracranial hemorrhage. Patchy T2 hyperintensity in the supratentorial white matter likely reflects stable chronic microvascular ischemic changes. Vascular: Major vessel flow voids at the skull base are preserved. Skull and upper cervical spine: Normal marrow signal is preserved. Sinuses/Orbits: Minor mucosal thickening.  There is susceptibility artifact along the left orbit from fixation hardware. Other: Sella is unremarkable.  Mastoid air cells are clear. IMPRESSION: Evolving recent right paramedian pontine infarction with slight increase in extent of abnormal signal. No hemorrhage. Electronically Signed   By: Macy Mis M.D.   On: 08/30/2019 20:34   ECHOCARDIOGRAM COMPLETE  Result Date: 08/30/2019   ECHOCARDIOGRAM REPORT   Patient Name:   LEVERE FAHRER Rampy Date of Exam: 08/30/2019 Medical Rec #:  [id number]     Height:       67.0 in Accession #:    [id number]    Weight:       150.0 lb Date of Birth:  Dec 10, 1955     BSA:          1.79 m Patient Age:    65 years      BP:           141/71 mmHg Patient Gender: M             HR:           74 bpm. Exam Location:  Inpatient Procedure: 2D Echo Indications:    stroke 434.91  History:        Patient has no prior history of Echocardiogram examinations.                 Risk Factors:Diabetes and Hypertension.  Sonographer:    Johny Chess Referring Phys: [place]  1. Left ventricular ejection fraction, by visual estimation, is 60 to 65%. The left ventricle has normal function. There is moderately increased left ventricular hypertrophy.  2. Global right ventricle has normal systolic function.The right ventricular size is normal.  3. Left atrial size was normal.  4. Right atrial size was normal.  5. The mitral valve is normal in structure. Trivial mitral valve regurgitation.  6. The tricuspid valve is normal in structure. Mild regurgitation.  7. The aortic valve is tricuspid. Aortic valve regurgitation is not visualized. No evidence of aortic valve sclerosis or stenosis.  8. The pulmonic valve was not well visualized. Pulmonic valve regurgitation is not visualized.  9. The inferior vena cava is normal in size with greater than 50% respiratory variability, suggesting right atrial pressure of 3 mmHg. 10. The tricuspid regurgitant velocity is 2.65 m/s, and with an  assumed right atrial pressure of 3 mmHg, the estimated right ventricular systolic pressure is mildly elevated at 31.1 mmHg. FINDINGS  Left Ventricle: Left ventricular ejection fraction, by visual estimation, is 60 to 65%. The left ventricle has normal function. The left ventricle has no regional wall motion abnormalities. There is moderately increased left ventricular hypertrophy.  Asymmetric left ventricular hypertrophy. Left ventricular diastolic parameters were normal. Right Ventricle: The right ventricular size is normal. No increase in right ventricular wall thickness. Global RV systolic function is has normal systolic function. The tricuspid regurgitant velocity is 2.65 m/s, and with an assumed right atrial pressure  of 3 mmHg, the estimated right ventricular systolic pressure is mildly elevated at 31.1 mmHg. Left Atrium: Left atrial size was normal in size. Right Atrium: Right atrial size was normal in size Pericardium: There is no evidence of pericardial effusion. Mitral Valve: The mitral valve is normal in structure. Trivial mitral valve regurgitation. Tricuspid Valve: The tricuspid valve is normal in structure. Tricuspid valve regurgitation is mild. Aortic Valve: The aortic valve is tricuspid. Aortic valve regurgitation is not visualized. The aortic valve is structurally normal, with no evidence of sclerosis or stenosis. Pulmonic Valve: The pulmonic valve was not well visualized. Pulmonic valve regurgitation is not visualized. Pulmonic regurgitation is not visualized. Aorta: The aortic root is normal in size and structure. Venous: The inferior vena cava is normal in size with greater than 50% respiratory variability, suggesting right atrial pressure of 3 mmHg. IAS/Shunts: The atrial septum is grossly normal.  LEFT VENTRICLE PLAX 2D LVIDd:         4.90 cm  Diastology LVIDs:         3.30 cm  LV e' lateral:   13.10 cm/s LV PW:         1.10 cm  LV E/e' lateral: 5.1 LV IVS:        0.90 cm  LV e' medial:    8.16  cm/s LVOT diam:     2.10 cm  LV E/e' medial:  8.2 LV SV:         69 ml LV SV Index:   38.18 LVOT Area:     3.46 cm  RIGHT VENTRICLE RV S prime:     13.10 cm/s TAPSE (M-mode): 2.6 cm LEFT ATRIUM           Index       RIGHT ATRIUM           Index LA diam:      3.50 cm 1.96 cm/m  RA Area:     13.80 cm LA Vol (A2C): 47.2 ml 26.38 ml/m RA Volume:   34.10 ml  19.06 ml/m LA Vol (A4C): 45.4 ml 25.37 ml/m  AORTIC VALVE LVOT Vmax:   132.00 cm/s LVOT Vmean:  81.200 cm/s LVOT VTI:    0.268 m  AORTA Ao Root diam: 3.20 cm MITRAL VALVE                        TRICUSPID VALVE MV Area (PHT): 2.87 cm             TR Peak grad:   28.1 mmHg MV PHT:        76.56 msec           TR Vmax:        265.00 cm/s MV Decel Time: 264 msec MV E velocity: 67.30 cm/s 103 cm/s  SHUNTS MV A velocity: 72.00 cm/s 70.3 cm/s Systemic VTI:  0.27 m MV E/A ratio:  0.93       1.5       Systemic Diam: 2.10 cm  Oswaldo Milian MD Electronically signed by Oswaldo Milian MD Signature Date/Time: 08/30/2019/4:58:42 PM    Final      Scheduled Meds: .  stroke: mapping our early stages of  recovery book   Does not apply Once  . atorvastatin  20 mg Oral Daily  . clopidogrel  75 mg Oral Daily  . enoxaparin (LOVENOX) injection  40 mg Subcutaneous Q24H  . insulin aspart  0-9 Units Subcutaneous TID WC  . BAY [id number] or placebo ([place])  1 tablet Oral Daily   And  . BAY [id number] or placebo ([place])  1 tablet Oral Daily   Continuous Infusions: . sodium chloride 100 mL/hr (08/31/19 1900)     LOS: 3 days   Time Spent in minutes   30 minutes  Indya Oliveria D.O. on 09/01/2019 at 10:06 AM  Between 7am to 7pm - Please see pager noted on [email or web address]  After 7pm go to [email or web address]  And look for the night coverage person covering for me after hours  [facility] Office  [phone]

## 2019-09-02 LAB — GLUCOSE, CAPILLARY
Glucose-Capillary: 106 mg/dL — ABNORMAL HIGH (ref 70–99)
Glucose-Capillary: 151 mg/dL — ABNORMAL HIGH (ref 70–99)
Glucose-Capillary: 118 mg/dL — ABNORMAL HIGH (ref 70–99)
Glucose-Capillary: 121 mg/dL — ABNORMAL HIGH (ref 70–99)
Glucose-Capillary: 156 mg/dL — ABNORMAL HIGH (ref 70–99)
Glucose-Capillary: 128 mg/dL — ABNORMAL HIGH (ref 70–99)

## 2019-09-02 LAB — BASIC METABOLIC PANEL
Anion gap: 8 (ref 5–15)
BUN: 7 mg/dL — ABNORMAL LOW (ref 8–23)
CO2: 21 mmol/L — ABNORMAL LOW (ref 22–32)
Calcium: 9 mg/dL (ref 8.9–10.3)
Chloride: 100 mmol/L (ref 98–111)
Creatinine, Ser: 0.79 mg/dL (ref 0.61–1.24)
GFR calc Af Amer: >60 mL/min (ref >60)
GFR calc non Af Amer: >60 mL/min (ref >60)
Glucose, Bld: 100 mg/dL — ABNORMAL HIGH (ref 70–99)
Potassium: 3.3 mmol/L — ABNORMAL LOW (ref 3.5–5.1)
Sodium: 129 mmol/L — ABNORMAL LOW (ref 135–145)

## 2019-09-02 LAB — MAGNESIUM: Magnesium: 1.6 mg/dL — ABNORMAL LOW (ref 1.7–2.4)

## 2019-09-02 LAB — HEMOGLOBIN AND HEMATOCRIT, BLOOD
HCT: 25.5 % — ABNORMAL LOW (ref 39.0–52.0)
Hemoglobin: 8.7 g/dL — ABNORMAL LOW (ref 13.0–17.0)

## 2019-09-02 MED ORDER — POTASSIUM CHLORIDE CRYS ER 20 MEQ PO TBCR
40.0000 meq | EXTENDED_RELEASE_TABLET | Freq: Once | ORAL | Status: AC
  Administered 2019-09-02: 40 meq via ORAL
  Filled 2019-09-02: qty 2

## 2019-09-02 MED ORDER — POTASSIUM CHLORIDE CRYS ER 20 MEQ PO TBCR
20.0000 meq | EXTENDED_RELEASE_TABLET | Freq: Two times a day (BID) | ORAL | Status: AC
  Administered 2019-09-02 (×2): 20 meq via ORAL
  Filled 2019-09-02 (×2): qty 1

## 2019-09-02 MED ORDER — MAGNESIUM SULFATE 2 GM/50ML IV SOLN
2.0000 g | Freq: Once | INTRAVENOUS | Status: AC
  Administered 2019-09-02: 2 g via INTRAVENOUS
  Filled 2019-09-02: qty 50

## 2019-09-02 NOTE — Progress Notes (Signed)
Physical Therapy Treatment Patient Details Name: [name] [name] MRN: [id number] DOB: 1956/08/15 Today's Date: 09/02/2019    History of Present Illness Pt is a 64 y/o male admitted to ED on 1/8 with staggering gait, L hemiparesis, dysarthria, and fall due to weakness. 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 Pt with CLL with recent CT chest, abdomen, and pelvis revealing improving lymphadenopathy. Other notable PMH includes DM, sleep apnea, nasal septoplasty 04/2018.    PT Comments    Pt received in bed, ready and eager to participate in therapy. He required min assist bed mobility, +2 min assist sit to stand and +2 mod assist SPT. Pt positioned in recliner with feet elevated at end of session.    Follow Up Recommendations  CIR     Equipment Recommendations  Other (comment)(defer to next venue)    Recommendations for Other Services       Precautions / Restrictions Precautions Precautions: Fall Precaution Comments: L hemiparesis    Mobility  Bed Mobility Overal bed mobility: Needs Assistance Bed Mobility: Supine to Sit     Supine to sit: HOB elevated;Min assist     General bed mobility comments: +rail, cues for sequencing, assist to elevate trunk  Transfers Overall transfer level: Needs assistance Equipment used: 2 person hand held assist Transfers: Sit to/from Stand;Stand Pivot Transfers Sit to Stand: Min assist;+2 physical assistance Stand pivot transfers: +2 physical assistance;Mod assist       General transfer comment: +2 min assist to power up. +2 mod assist pivot bed to recliner toward right. Therapist blocking L knee during pivot.  Ambulation/Gait             General Gait Details: pre-gait activities in stance with BUE support, lateral weight shifts, +2 min/mod assist.   Stairs             Wheelchair Mobility    Modified Rankin (Stroke Patients Only) Modified Rankin (Stroke Patients Only) Pre-Morbid  Rankin Score: No symptoms Modified Rankin: Moderately severe disability     Balance Overall balance assessment: Needs assistance Sitting-balance support: Feet supported;No upper extremity supported Sitting balance-Leahy Scale: Fair     Standing balance support: Single extremity supported;During functional activity Standing balance-Leahy Scale: Poor Standing balance comment: reliant on external support                            Cognition Arousal/Alertness: Awake/alert Behavior During Therapy: WFL for tasks assessed/performed Overall Cognitive Status: Impaired/Different from baseline Area of Impairment: Problem solving                             Problem Solving: Slow processing;Difficulty sequencing;Requires verbal cues        Exercises      General Comments        Pertinent Vitals/Pain Pain Assessment: Faces Faces Pain Scale: Hurts little more Pain Location: LUE during ROM and WB Pain Descriptors / Indicators: Grimacing;Guarding Pain Intervention(s): Monitored during session;Repositioned    Home Living                      Prior Function            PT Goals (current goals can now be found in the care plan section) Acute Rehab PT Goals Patient Stated Goal: return to independence Progress towards PT goals: Progressing toward goals    Frequency  Min 4X/week      PT Plan Current plan remains appropriate    Co-evaluation PT/OT/SLP Co-Evaluation/Treatment: Yes Reason for Co-Treatment: For patient/therapist safety;To address functional/ADL transfers PT goals addressed during session: Mobility/safety with mobility;Balance        AM-PAC PT "6 Clicks" Mobility   Outcome Measure  Help needed turning from your back to your side while in a flat bed without using bedrails?: None Help needed moving from lying on your back to sitting on the side of a flat bed without using bedrails?: A Little Help needed moving to and from a  bed to a chair (including a wheelchair)?: A Lot Help needed standing up from a chair using your arms (e.g., wheelchair or bedside chair)?: A Lot Help needed to walk in hospital room?: A Lot Help needed climbing 3-5 steps with a railing? : Total 6 Click Score: 14    End of Session Equipment Utilized During Treatment: Gait belt Activity Tolerance: Patient tolerated treatment well Patient left: in chair;with call bell/phone within reach Nurse Communication: Mobility status PT Visit Diagnosis: Hemiplegia and hemiparesis;Muscle weakness (generalized) (M62.81) Hemiplegia - Right/Left: Left Hemiplegia - dominant/non-dominant: Non-dominant Hemiplegia - caused by: Cerebral infarction     Time: [id number] PT Time Calculation (min) (ACUTE ONLY): 24 min  Charges:  $Therapeutic Activity: 8-22 mins                     Lorrin Goodell, PT  Office # [phone] Pager [phone]    Lorriane Shire 09/02/2019, 10:50 AM

## 2019-09-02 NOTE — Progress Notes (Signed)
PROGRESS NOTE    [name] [name]  [id number] DOB: Aug 01, 1956 DOA: 08/29/2019 PCP: Bernerd Limbo, MD   Brief Narrative:  HPI on 08/29/2019 by Dr. Jennette Kettle ELAI [name] is a 64 y.o. male with medical history significant of CLL / small B cell lymphoma, DM2, HTN. Patient with L sided weakness in upper and lower extremities onset yesterday.  Unclear if patients symptoms changed throughout day, but they are persistent. No confusion, no fever, no SOB.  Interim history  Admitted for Acute CVA. Neuro consulted. Now pending CIR.  Assessment & Plan   Acute ischemic CVA -CT head was unremarkable -MRI brain: Acute infarction of the right paramedian pons -Echocardiogram EF 60-65% -LDL 85 hemoglobin A1c 5.9 -PT recommended CIR -Inpatient rehab consulted and appreciated -Neurology consulted and appreciated, placed on plavix and enrolled in [place] Factor XI inhibitor stroke prevention trial -Continue statin, plavix  Hyponatremia, chronic -Review of chart shows that patient did have a low sodium in Sep through November 2020 -sodium 129 -Continue IV fluids -Monitor BMP  Hyperkalemia -continue to replace and monitor BMP  CLL/small B-cell lymphoma -Last chemotherapy appears to occurred in October 2020 -Patient sees Dr. Irene Limbo  Diabetes mellitus, type II -Metfomin held -A1c 5.9 -Continue insulin sliding scale CBG monitoring  Essential Hypertension -Allow for permissive hypertension given acute CVA -BP stable  DVT Prophylaxis Lovenox  Code Status: Full  Family Communication: None at bedside  Disposition Plan: Admitted.  Pending CIR consultation and placement.  Consultants Neurology Inpatient rehab  Procedures  Echocardiogram  Antibiotics   Anti-infectives (From admission, onward)   None      Subjective:   [name] [name] seen and examined today.  Patient would like to be in rehab now.  He feels he is tired of sitting on the chair or laying in the bed.  States he  fell asleep in the chair last night and now feels numb.  Denies current chest pain, shortness of breath, abdominal pain, nausea or vomiting, diarrhea or constipation, dizziness or headache. Objective:   Vitals:   09/01/19 1933 09/01/19 2332 09/02/19 0343 09/02/19 0750  BP: 131/63 130/68 129/68 139/74  Pulse: 67 72 85 63  Resp: 18 17 16 16   Temp: 97.9 F (36.6 C) 99.2 F (37.3 C) 99 F (37.2 C) 98.6 F (37 C)  TempSrc: Oral Oral Oral Oral  SpO2: 100% 96% 98% 97%  Weight:      Height:        Intake/Output Summary (Last 24 hours) at 09/02/2019 0933 Last data filed at 09/02/2019 0600 Gross per 24 hour  Intake 2547.52 ml  Output 3000 ml  Net -452.48 ml   Filed Weights   08/29/19 1923  Weight: 68 kg   Exam  General: Well developed, well nourished, NAD  HEENT: NCAT, mucous membranes moist.   Cardiovascular: S1 S2 auscultated, RRR  Respiratory: Clear to auscultation bilaterally   Abdomen: Soft, nontender, nondistended, + bowel sounds  Extremities: warm dry without cyanosis clubbing or edema  Neuro: AAOx3, left-sided weakness 2-3/5  Psych: Appropriate mood and affect, pleasant   Data Reviewed: I have personally reviewed following labs and imaging studies  CBC: Recent Labs  Lab 08/27/19 0845 08/29/19 1931 08/29/19 1947 08/29/19 2004 08/31/19 0402 09/01/19 0201 09/02/19 0116  WBC 4.1 4.3  --   --  3.7*  --   --   NEUTROABS 2.6 3.2  --   --   --   --   --   HGB 9.7* 9.8* 9.9*  10.2* 9.3* 9.0* 8.7*  HCT 28.8* 28.8* 29.0* 30.0* 27.3* 27.2* 25.5*  MCV 71.5* 73.1*  --   --  72.2*  --   --   PLT 313 310  --   --  362  --   --    Basic Metabolic Panel: Recent Labs  Lab 08/27/19 0845 08/27/19 0845 08/29/19 1931 08/29/19 1947 08/29/19 2004 08/31/19 0402 09/01/19 0201 09/02/19 0116  NA 129*  --  128* 128* 126* 129* 130* 129*  K 3.6  --  4.1 4.0 5.4* 3.5 3.6 3.3*  CL 96*  --  94* 94* 95* 97* 98 100  CO2 24  --  23  --   --  22 22 21*  GLUCOSE 169*  --  115*  111* 108* 145* 109* 100*  BUN 10  --  8 9 11  7* 7* 7*  CREATININE 0.97   < > 0.86 0.80 0.80 0.99 0.88 0.79  CALCIUM 9.4  --  9.7  --   --  8.8* 9.3 9.0  MG 1.8  --   --   --   --   --   --   --    < > = values in this interval not displayed.   GFR: Estimated Creatinine Clearance: 89.5 mL/min (by C-G formula based on SCr of 0.79 mg/dL). Liver Function Tests: Recent Labs  Lab 08/27/19 0845 08/29/19 1931  AST 14* 18  ALT 8 8  ALKPHOS 59 61  BILITOT 1.7* 1.3*  PROT 7.0 7.0  ALBUMIN 3.9 4.0   No results for input(s): LIPASE, AMYLASE in the last 168 hours. No results for input(s): AMMONIA in the last 168 hours. Coagulation Profile: Recent Labs  Lab 08/29/19 1931  INR 1.0   Cardiac Enzymes: No results for input(s): CKTOTAL, CKMB, CKMBINDEX, TROPONINI in the last 168 hours. BNP (last 3 results) No results for input(s): PROBNP in the last 8760 hours. HbA1C: No results for input(s): HGBA1C in the last 72 hours. CBG: Recent Labs  Lab 09/01/19 1150 09/01/19 1622 09/01/19 2116 09/02/19 0602 09/02/19 0753  GLUCAP 131* 106* 112* 151* 118*   Lipid Profile: No results for input(s): CHOL, HDL, LDLCALC, TRIG, CHOLHDL, LDLDIRECT in the last 72 hours. Thyroid Function Tests: No results for input(s): TSH, T4TOTAL, FREET4, T3FREE, THYROIDAB in the last 72 hours. Anemia Panel: No results for input(s): VITAMINB12, FOLATE, FERRITIN, TIBC, IRON, RETICCTPCT in the last 72 hours. Urine analysis:    Component Value Date/Time   COLORURINE YELLOW 08/29/2019 2118   APPEARANCEUR CLEAR 08/29/2019 2118   LABSPEC 1.014 08/29/2019 2118   PHURINE 6.0 08/29/2019 2118   GLUCOSEU NEGATIVE 08/29/2019 2118   HGBUR NEGATIVE 08/29/2019 2118   BILIRUBINUR NEGATIVE 08/29/2019 2118   [place] NEGATIVE 08/29/2019 2118   PROTEINUR NEGATIVE 08/29/2019 2118   NITRITE NEGATIVE 08/29/2019 2118   LEUKOCYTESUR NEGATIVE 08/29/2019 2118   Sepsis Labs: @LABRCNTIP (procalcitonin:4,lacticidven:4)  ) Recent  Results (from the past 240 hour(s))  SARS CORONAVIRUS 2 (TAT 6-24 HRS) Nasopharyngeal Nasopharyngeal Swab     Status: None   Collection Time: 08/29/19  9:18 PM   Specimen: Nasopharyngeal Swab  Result Value Ref Range Status   SARS Coronavirus 2 NEGATIVE NEGATIVE Final    Comment: (NOTE) SARS-CoV-2 target nucleic acids are NOT DETECTED. The SARS-CoV-2 RNA is generally detectable in upper and lower respiratory specimens during the acute phase of infection. Negative results do not preclude SARS-CoV-2 infection, do not rule out co-infections with other pathogens, and should not be used as the  sole basis for treatment or other patient management decisions. Negative results must be combined with clinical observations, patient history, and epidemiological information. The expected result is Negative. Fact Sheet for Patients: SugarRoll.be Fact Sheet for Healthcare Providers: https://www.woods-mathews.com/ This test is not yet approved or cleared by the [place] FDA and  has been authorized for detection and/or diagnosis of SARS-CoV-2 by FDA under an Emergency Use Authorization (EUA). This EUA will remain  in effect (meaning this test can be used) for the duration of the COVID-19 declaration under Section 56 4(b)(1) of the Act, 21 U.S.C. section 360bbb-3(b)(1), unless the authorization is terminated or revoked sooner. Performed at [facility], [address] [place], [place] [address]       Radiology Studies: No results found.   Scheduled Meds: .  stroke: mapping our early stages of recovery book   Does not apply Once  . atorvastatin  20 mg Oral Daily  . clopidogrel  75 mg Oral Daily  . enoxaparin (LOVENOX) injection  40 mg Subcutaneous Q24H  . insulin aspart  0-9 Units Subcutaneous TID WC  . potassium chloride  20 mEq Oral BID  . BAY [id number] or placebo (Green [place])  1 tablet Oral Daily   And  . BAY [id number] or placebo (Blue  Bottle)  1 tablet Oral Daily   Continuous Infusions: . sodium chloride 100 mL/hr (09/02/19 0600)     LOS: 4 days   Time Spent in minutes   30 minutes  Alexianna Nachreiner D.O. on 09/02/2019 at 9:33 AM  Between 7am to 7pm - Please see pager noted on [email or web address]  After 7pm go to [email or web address]  And look for the night coverage person covering for me after hours  [facility] Office  [phone]

## 2019-09-02 NOTE — Progress Notes (Signed)
Inpatient Rehabilitation Admissions Coordinator  Inpatient rehab consult received. I met with pt at bedside for rehab assessment, discussions of goals and expectations. He prefers inpt rehab and I will begin insurance authorization with [facility]. I will follow up tomorrow.  Danne Baxter, RN, MSN Rehab Admissions Coordinator [phone] 09/02/2019 12:52 PM

## 2019-09-02 NOTE — Progress Notes (Signed)
Occupational Therapy Treatment Patient Details Name: [name] [name] MRN: [id number] DOB: Dec 24, 1955 Today's Date: 09/02/2019    History of present illness Pt is a 64 y/o male admitted to ED on 1/8 with staggering gait, L hemiparesis, dysarthria, and fall due to weakness. 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 Pt with CLL with recent CT chest, abdomen, and pelvis revealing improving lymphadenopathy. Other notable PMH includes DM, sleep apnea, nasal septoplasty 04/2018.   OT comments  Pt progressing toward established goals. Pt currently requires minA+2 for sit<>stand x3 and modA+2 for simulated toilet transfer toward right. Reviewed general upper extremity ROM HEP for LUE, pt demonstrated understanding. Pt will continue to benefit from skilled OT services to maximize safety and independence with ADL/IADL and functional mobility. Will continue to follow acutely and progress as tolerated.      Follow Up Recommendations  CIR;Supervision/Assistance - 24 hour    Equipment Recommendations  Other (comment)(TBD)    Recommendations for Other Services      Precautions / Restrictions Precautions Precautions: Fall Precaution Comments: L hemiparesis Restrictions Weight Bearing Restrictions: No       Mobility Bed Mobility Overal bed mobility: Needs Assistance Bed Mobility: Supine to Sit     Supine to sit: HOB elevated;Min assist     General bed mobility comments: +rail, cues for sequencing, assist to elevate trunk  Transfers Overall transfer level: Needs assistance Equipment used: 2 person hand held assist Transfers: Sit to/from Stand;Stand Pivot Transfers Sit to Stand: Min assist;+2 physical assistance Stand pivot transfers: Mod assist;+2 physical assistance       General transfer comment: +2 minA to powerup into standing;modA+2 for pivot toward recliner on right, required L knee blocking    Balance Overall balance assessment: Needs  assistance Sitting-balance support: Feet supported;No upper extremity supported Sitting balance-Leahy Scale: Fair Sitting balance - Comments: static sitting balance, preference to R UE support at EOB    Standing balance support: Single extremity supported;During functional activity Standing balance-Leahy Scale: Poor Standing balance comment: reliant on external support                           ADL either performed or assessed with clinical judgement   ADL Overall ADL's : Needs assistance/impaired     Grooming: Minimal assistance;Sitting                   Toilet Transfer: Moderate assistance;Ambulation;+2 for physical assistance;+2 for safety/equipment Toilet Transfer Details (indicate cue type and reason): took 4 side steps towards right to pivot to the recliner, pt required modA+2          Functional mobility during ADLs: Moderate assistance;+2 for physical assistance;Minimal assistance General ADL Comments: limited by L hemiparesis, impaired balance, decreased activity tolerance     Vision   Additional Comments: reports intermittent diplopia, but subsides after focusing on object   Perception     Praxis      Cognition Arousal/Alertness: Awake/alert Behavior During Therapy: WFL for tasks assessed/performed Overall Cognitive Status: Impaired/Different from baseline Area of Impairment: Problem solving;Safety/judgement;Following commands                       Following Commands: Follows multi-step commands with increased time Safety/Judgement: Decreased awareness of safety   Problem Solving: Slow processing;Difficulty sequencing;Requires verbal cues General Comments: pt with slow processing, requiring increased time to respond        Exercises  Shoulder Instructions       General Comments      Pertinent Vitals/ Pain       Pain Assessment: Faces Faces Pain Scale: Hurts little more Pain Location: LUE during ROM and WB Pain  Descriptors / Indicators: Grimacing;Guarding Pain Intervention(s): Limited activity within patient's tolerance;Monitored during session  Home Living                                          Prior Functioning/Environment              Frequency  Min 2X/week        Progress Toward Goals  OT Goals(current goals can now be found in the care plan section)  Progress towards OT goals: Progressing toward goals  Acute Rehab OT Goals Patient Stated Goal: return to independence OT Goal Formulation: With patient Time For Goal Achievement: 09/14/19 Potential to Achieve Goals: Good ADL Goals Pt Will Perform Grooming: with set-up;with supervision;sitting Pt Will Perform Upper Body Bathing: with min assist;sitting Pt Will Perform Lower Body Dressing: with min assist;sit to/from stand Pt Will Transfer to Toilet: with min assist;stand pivot transfer;bedside commode Pt Will Perform Toileting - Clothing Manipulation and hygiene: with min assist;sit to/from stand;sitting/lateral leans Pt/caregiver will Perform Home Exercise Program: Increased ROM;Increased strength;Left upper extremity;With written HEP provided Additional ADL Goal #1: Pt will engage in dynamic sitting balance activities with supervision for 10 minutes as precursor to ADLs.  Plan Discharge plan remains appropriate    Co-evaluation    PT/OT/SLP Co-Evaluation/Treatment: Yes Reason for Co-Treatment: For patient/therapist safety;To address functional/ADL transfers PT goals addressed during session: Mobility/safety with mobility;Balance OT goals addressed during session: ADL's and self-care      AM-PAC OT "6 Clicks" Daily Activity     Outcome Measure   Help from another person eating meals?: A Little Help from another person taking care of personal grooming?: A Little Help from another person toileting, which includes using toliet, bedpan, or urinal?: A Lot Help from another person bathing (including  washing, rinsing, drying)?: A Lot Help from another person to put on and taking off regular upper body clothing?: A Lot Help from another person to put on and taking off regular lower body clothing?: A Lot 6 Click Score: 14    End of Session Equipment Utilized During Treatment: Gait belt  OT Visit Diagnosis: Other abnormalities of gait and mobility (R26.89);Muscle weakness (generalized) (M62.81);Hemiplegia and hemiparesis Hemiplegia - Right/Left: Left Hemiplegia - dominant/non-dominant: Non-Dominant Hemiplegia - caused by: Cerebral infarction   Activity Tolerance Patient tolerated treatment well   Patient Left in chair;with call bell/phone within reach   Nurse Communication Mobility status        Time: [id number] OT Time Calculation (min): 25 min  Charges: OT General Charges $OT Visit: 1 Visit OT Treatments $Self Care/Home Management : 8-22 mins  Dorinda Hill OTR/L Acute Rehabilitation Services Office: [place] 09/02/2019, 11:36 AM

## 2019-09-02 NOTE — Progress Notes (Signed)
  Speech Language Pathology Treatment: Cognitive-Linquistic  Patient Details Name: [name] [name] MRN: [id number] DOB: 04/11/1956 Today's Date: 09/02/2019 Time: [id number] SLP Time Calculation (min) (ACUTE ONLY): 21 min  Assessment / Plan / Recommendation Clinical Impression  Patient seen at bedside for skilled speech therapy. Patient cooperative, endorsing some fatigue but agreeable to participate in session. Patient presents with mild dysarthria, mild problem solving deficits. Patient oriented x4, able to verbalize reason for hospitalization "I had a stroke." Patient reports experiencing double vision that he reports started "around Saturday." Patient reports his mother lives with him. Functional reading and writing informally assessed, both appear to be intact. Patient with mild dysarthria, provided education re: slowing rate and overarticulating to increase intelligibility. Pt engaged in functional verbal problem solving/judgement tasks. Pt able to identify problem scenarios, but demonstrated reduced problem solving skills. For example, when asked what he would do if he accidentally took too many doses of one of his medications, he responded that he would drink milk. When given moderate verbal prompts, he stated he would call the pharmacy. Pt also demonstrated difficulty with functional time calculation/executive function tasks. Patient will benefit from continued ST services.    HPI HPI: 64 yo male admitted to ED on 1/8 with staggering gait, L hemiparesis, dysarthria, and fall due to weakness. 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 Pt with CLL with recent CT chest, abdomen, and pelvis revealing improving lymphadenopathy. Other notable PMH includes DM, sleep apnea, nasal septoplasty 04/2018.      SLP Plan  Continue with current plan of care       Recommendations      CIR             Follow up Recommendations: Inpatient Rehab SLP Visit Diagnosis:  Dysarthria and anarthria (R47.1);Cognitive communication deficit (R41.841) Plan: Continue with current plan of care       [place], M.Ed., CCC-SLP Speech Therapy Acute Rehabilitation [place] 09/02/2019, 4:07 PM

## 2019-09-03 ENCOUNTER — Ambulatory Visit: Payer: Medicare Other | Admitting: Hematology

## 2019-09-03 ENCOUNTER — Inpatient Hospital Stay (HOSPITAL_COMMUNITY)
Admission: RE | Admit: 2019-09-03 | Discharge: 2019-09-28 | DRG: 057 | Disposition: A | Discharge status: Home Health | Payer: Medicare Other | Source: Intra-hospital | Attending: Physical Medicine & Rehabilitation | Admitting: Physical Medicine & Rehabilitation

## 2019-09-03 ENCOUNTER — Other Ambulatory Visit: Payer: Self-pay

## 2019-09-03 DIAGNOSIS — I10 Essential (primary) hypertension: Secondary | ICD-10-CM

## 2019-09-03 DIAGNOSIS — K219 Gastro-esophageal reflux disease without esophagitis: Secondary | ICD-10-CM | POA: Diagnosis present

## 2019-09-03 DIAGNOSIS — C8330 Diffuse large B-cell lymphoma, unspecified site: Secondary | ICD-10-CM | POA: Diagnosis present

## 2019-09-03 DIAGNOSIS — Z856 Personal history of leukemia: Secondary | ICD-10-CM | POA: Diagnosis not present

## 2019-09-03 DIAGNOSIS — Z8679 Personal history of other diseases of the circulatory system: Secondary | ICD-10-CM | POA: Diagnosis not present

## 2019-09-03 DIAGNOSIS — E1151 Type 2 diabetes mellitus with diabetic peripheral angiopathy without gangrene: Secondary | ICD-10-CM | POA: Diagnosis present

## 2019-09-03 DIAGNOSIS — Z9221 Personal history of antineoplastic chemotherapy: Secondary | ICD-10-CM | POA: Diagnosis not present

## 2019-09-03 DIAGNOSIS — Z79899 Other long term (current) drug therapy: Secondary | ICD-10-CM

## 2019-09-03 DIAGNOSIS — D72819 Decreased white blood cell count, unspecified: Secondary | ICD-10-CM | POA: Diagnosis not present

## 2019-09-03 DIAGNOSIS — G4733 Obstructive sleep apnea (adult) (pediatric): Secondary | ICD-10-CM | POA: Diagnosis present

## 2019-09-03 DIAGNOSIS — F329 Major depressive disorder, single episode, unspecified: Secondary | ICD-10-CM | POA: Diagnosis present

## 2019-09-03 DIAGNOSIS — E8809 Other disorders of plasma-protein metabolism, not elsewhere classified: Secondary | ICD-10-CM | POA: Diagnosis present

## 2019-09-03 DIAGNOSIS — C9110 Chronic lymphocytic leukemia of B-cell type not having achieved remission: Secondary | ICD-10-CM | POA: Diagnosis present

## 2019-09-03 DIAGNOSIS — D649 Anemia, unspecified: Secondary | ICD-10-CM | POA: Diagnosis present

## 2019-09-03 DIAGNOSIS — C8308 Small cell B-cell lymphoma, lymph nodes of multiple sites: Secondary | ICD-10-CM | POA: Diagnosis present

## 2019-09-03 DIAGNOSIS — Z6825 Body mass index (BMI) 25.0-25.9, adult: Secondary | ICD-10-CM | POA: Diagnosis not present

## 2019-09-03 DIAGNOSIS — E871 Hypo-osmolality and hyponatremia: Secondary | ICD-10-CM | POA: Diagnosis present

## 2019-09-03 DIAGNOSIS — K5900 Constipation, unspecified: Secondary | ICD-10-CM | POA: Diagnosis present

## 2019-09-03 DIAGNOSIS — E876 Hypokalemia: Secondary | ICD-10-CM | POA: Diagnosis present

## 2019-09-03 DIAGNOSIS — M21372 Foot drop, left foot: Secondary | ICD-10-CM | POA: Diagnosis present

## 2019-09-03 DIAGNOSIS — E46 Unspecified protein-calorie malnutrition: Secondary | ICD-10-CM | POA: Diagnosis present

## 2019-09-03 DIAGNOSIS — D72810 Lymphocytopenia: Secondary | ICD-10-CM | POA: Diagnosis not present

## 2019-09-03 DIAGNOSIS — E1165 Type 2 diabetes mellitus with hyperglycemia: Secondary | ICD-10-CM | POA: Diagnosis present

## 2019-09-03 DIAGNOSIS — Z7902 Long term (current) use of antithrombotics/antiplatelets: Secondary | ICD-10-CM

## 2019-09-03 DIAGNOSIS — Z20822 Contact with and (suspected) exposure to covid-19: Secondary | ICD-10-CM | POA: Diagnosis present

## 2019-09-03 DIAGNOSIS — I6350 Cerebral infarction due to unspecified occlusion or stenosis of unspecified cerebral artery: Secondary | ICD-10-CM | POA: Diagnosis not present

## 2019-09-03 DIAGNOSIS — E785 Hyperlipidemia, unspecified: Secondary | ICD-10-CM | POA: Diagnosis present

## 2019-09-03 DIAGNOSIS — I69322 Dysarthria following cerebral infarction: Secondary | ICD-10-CM | POA: Diagnosis not present

## 2019-09-03 DIAGNOSIS — Z833 Family history of diabetes mellitus: Secondary | ICD-10-CM | POA: Diagnosis not present

## 2019-09-03 DIAGNOSIS — R509 Fever, unspecified: Secondary | ICD-10-CM | POA: Diagnosis not present

## 2019-09-03 DIAGNOSIS — R7309 Other abnormal glucose: Secondary | ICD-10-CM

## 2019-09-03 DIAGNOSIS — Z87891 Personal history of nicotine dependence: Secondary | ICD-10-CM

## 2019-09-03 DIAGNOSIS — G479 Sleep disorder, unspecified: Secondary | ICD-10-CM

## 2019-09-03 DIAGNOSIS — R918 Other nonspecific abnormal finding of lung field: Secondary | ICD-10-CM | POA: Diagnosis not present

## 2019-09-03 DIAGNOSIS — R05 Cough: Secondary | ICD-10-CM | POA: Diagnosis not present

## 2019-09-03 DIAGNOSIS — Z823 Family history of stroke: Secondary | ICD-10-CM

## 2019-09-03 DIAGNOSIS — I700 Atherosclerosis of aorta: Secondary | ICD-10-CM | POA: Diagnosis not present

## 2019-09-03 DIAGNOSIS — R0989 Other specified symptoms and signs involving the circulatory and respiratory systems: Secondary | ICD-10-CM

## 2019-09-03 DIAGNOSIS — I639 Cerebral infarction, unspecified: Secondary | ICD-10-CM | POA: Diagnosis present

## 2019-09-03 DIAGNOSIS — I69354 Hemiplegia and hemiparesis following cerebral infarction affecting left non-dominant side: Principal | ICD-10-CM

## 2019-09-03 DIAGNOSIS — D509 Iron deficiency anemia, unspecified: Secondary | ICD-10-CM | POA: Diagnosis not present

## 2019-09-03 LAB — BASIC METABOLIC PANEL
Anion gap: 9 (ref 5–15)
BUN: 6 mg/dL — ABNORMAL LOW (ref 8–23)
CO2: 20 mmol/L — ABNORMAL LOW (ref 22–32)
Calcium: 8.6 mg/dL — ABNORMAL LOW (ref 8.9–10.3)
Chloride: 97 mmol/L — ABNORMAL LOW (ref 98–111)
Creatinine, Ser: 0.8 mg/dL (ref 0.61–1.24)
GFR calc Af Amer: >60 mL/min (ref >60)
GFR calc non Af Amer: >60 mL/min (ref >60)
Glucose, Bld: 104 mg/dL — ABNORMAL HIGH (ref 70–99)
Potassium: 3.6 mmol/L (ref 3.5–5.1)
Sodium: 126 mmol/L — ABNORMAL LOW (ref 135–145)

## 2019-09-03 LAB — GLUCOSE, CAPILLARY
Glucose-Capillary: 94 mg/dL (ref 70–99)
Glucose-Capillary: 133 mg/dL — ABNORMAL HIGH (ref 70–99)
Glucose-Capillary: 97 mg/dL (ref 70–99)
Glucose-Capillary: 160 mg/dL — ABNORMAL HIGH (ref 70–99)
Glucose-Capillary: 141 mg/dL — ABNORMAL HIGH (ref 70–99)

## 2019-09-03 MED ORDER — ACETAMINOPHEN 325 MG PO TABS
650.0000 mg | ORAL_TABLET | ORAL | Status: DC | PRN
  Administered 2019-09-04 – 2019-09-26 (×11): 650 mg via ORAL
  Filled 2019-09-03 (×14): qty 2

## 2019-09-03 MED ORDER — INSULIN ASPART 100 UNIT/ML ~~LOC~~ SOLN: 0.0000 [IU] | Freq: Three times a day (TID) | SUBCUTANEOUS | Status: DC

## 2019-09-03 MED ORDER — CLOPIDOGREL BISULFATE 75 MG PO TABS
75.0000 mg | ORAL_TABLET | Freq: Every day | ORAL | Status: DC
  Administered 2019-09-04 – 2019-09-28 (×25): 75 mg via ORAL
  Filled 2019-09-03 (×25): qty 1

## 2019-09-03 MED ORDER — STUDY - PACIFIC (STROKE) - BAY 2433334 (GREEN BOTTLE) 5, 15, 25MG OR PLACEBO TABLET (PI-SETHI)
1.0000 | ORAL_TABLET | Freq: Every day | ORAL | Status: DC
  Administered 2019-09-04 – 2019-09-28 (×25): 1 via ORAL
  Filled 2019-09-03 (×26): qty 1

## 2019-09-03 MED ORDER — STUDY - PACIFIC (STROKE) - BAY 2433334 (BLUE BOTTLE) 5, 15, 25MG OR PLACEBO TABLET (PI-SETHI)
1.0000 | ORAL_TABLET | Freq: Every day | ORAL | Status: DC
  Administered 2019-09-04 – 2019-09-28 (×25): 1 via ORAL
  Filled 2019-09-03 (×25): qty 1

## 2019-09-03 MED ORDER — ZOLPIDEM TARTRATE 5 MG PO TABS
10.0000 mg | ORAL_TABLET | Freq: Every evening | ORAL | Status: DC | PRN
  Administered 2019-09-03 – 2019-09-08 (×6): 10 mg via ORAL
  Filled 2019-09-03 (×6): qty 2

## 2019-09-03 MED ORDER — ACETAMINOPHEN 160 MG/5ML PO SOLN: 650.0000 mg | ORAL | Status: DC | PRN

## 2019-09-03 MED ORDER — CLOPIDOGREL BISULFATE 75 MG PO TABS: 75.0000 mg | ORAL_TABLET | Freq: Every day | ORAL | 0 refills | Status: DC

## 2019-09-03 MED ORDER — ATORVASTATIN CALCIUM 10 MG PO TABS
20.0000 mg | ORAL_TABLET | Freq: Every day | ORAL | Status: DC
  Administered 2019-09-04 – 2019-09-28 (×25): 20 mg via ORAL
  Filled 2019-09-03 (×25): qty 2

## 2019-09-03 MED ORDER — ENOXAPARIN SODIUM 40 MG/0.4ML ~~LOC~~ SOLN: 40.0000 mg | SUBCUTANEOUS | Status: DC

## 2019-09-03 MED ORDER — ENOXAPARIN SODIUM 40 MG/0.4ML ~~LOC~~ SOLN
40.0000 mg | SUBCUTANEOUS | Status: DC
  Administered 2019-09-03 – 2019-09-22 (×20): 40 mg via SUBCUTANEOUS
  Filled 2019-09-03 (×20): qty 0.4

## 2019-09-03 MED ORDER — SORBITOL 70 % SOLN
30.0000 mL | Freq: Every day | Status: DC | PRN
  Administered 2019-09-18 – 2019-09-22 (×3): 30 mL via ORAL
  Filled 2019-09-03 (×4): qty 30

## 2019-09-03 MED ORDER — ACETAMINOPHEN 650 MG RE SUPP: 650.0000 mg | RECTAL | Status: DC | PRN

## 2019-09-03 NOTE — IPOC Note (Signed)
Individualized overall Plan of Care [facility]) Patient Details Name: [name] [name] MRN: [id number] DOB: Sep 08, 1955  Admitting Diagnosis: Right pontine CVA [facility])  Hospital Problems: Principal Problem:   Right pontine CVA ([place]) Active Problems:   Acute on chronic anemia   Lymphopenia   Hypoalbuminemia due to protein-calorie malnutrition ([place])   Controlled type 2 diabetes mellitus with hyperglycemia, without long-term current use of insulin (HCC)      Functional Problem List: Nursing Bladder, Bowel, Endurance, Perception, Sensory  PT Balance, Edema, Endurance, Motor, Nutrition, Pain, Perception, Safety, Skin Integrity  OT Balance, Cognition, Edema, Endurance, Motor, Pain, Perception, Safety, Vision  SLP Cognition  TR         Basic ADL's: OT Grooming, Bathing, Dressing, Toileting, Eating     Advanced  ADL's: OT       Transfers: PT Bed Mobility, Bed to Chair, Car, [employer], Floor  OT Toilet, Tub/Shower     Locomotion: PT Ambulation, Emergency planning/management officer, Stairs     Additional Impairments: OT Fuctional Use of Upper Extremity  SLP Social Cognition, Communication expression Problem Solving, Memory  TR      Anticipated Outcomes Item Anticipated Outcome  Self Feeding S  Swallowing      Basic self-care  S  Toileting  S   Bathroom Transfers S  Bowel/Bladder  Pt will manage bowel and bladder with mod I assist at discharge  Transfers  Supervision using LRAD.  Locomotion  Supervision using LRAD 50 feet.  Communication  Mod I  Cognition  Mod I  Pain  Pt will manage pain at 3 or less on a scale of 0-10.  Safety/Judgment  Pt will remain free of falls with injury while in rehab with min assist   Therapy Plan: PT Intensity: Minimum of 1-2 x/day ,45 to 90 minutes PT Frequency: 5 out of 7 days PT Duration Estimated Length of Stay: 2-2.5 weeks OT Intensity: Minimum of 1-2 x/day, 45 to 90 minutes OT Frequency: 5 out of 7 days OT Duration/Estimated Length of Stay:  2-2.5 weeks SLP Intensity: Minumum of 1-2 x/day, 30 to 90 minutes SLP Frequency: 3 to 5 out of 7 days SLP Duration/Estimated Length of Stay: 2-2.5 weeks    Team Interventions: Nursing Interventions Patient/Family Education, Bladder Management, Bowel Management, Disease Management/Prevention, Medication Management, Discharge Planning  PT interventions Ambulation/gait training, Discharge planning, Functional mobility training, Psychosocial support, Therapeutic Activities, Visual/perceptual remediation/compensation, Balance/vestibular training, Disease management/prevention, Neuromuscular re-education, Skin care/wound management, Therapeutic Exercise, Wheelchair propulsion/positioning, Cognitive remediation/compensation, DME/adaptive equipment instruction, Pain management, Splinting/orthotics, UE/LE Strength taining/ROM, Community reintegration, Technical sales engineer stimulation, Patient/family education, IT trainer, UE/LE Coordination activities  OT Interventions Training and development officer, Discharge planning, Functional electrical stimulation, Pain management, Self Care/advanced ADL retraining, Therapeutic Activities, UE/LE Coordination activities, Cognitive remediation/compensation, Disease mangement/prevention, Functional mobility training, Patient/family education, Skin care/wound managment, Therapeutic Exercise, Visual/perceptual remediation/compensation, Academic librarian, Engineer, drilling, Neuromuscular re-education, Psychosocial support, Splinting/orthotics, UE/LE Strength taining/ROM, Wheelchair propulsion/positioning  SLP Interventions Cognitive remediation/compensation, Internal/external aids, English as a second language teacher, Environmental controls, Therapeutic Activities, Patient/family education, Functional tasks, Speech/Language facilitation  TR Interventions    SW/CM Interventions Discharge Planning, Disease Management/Prevention, Psychosocial Support, Patient/Family Education    Barriers to Discharge MD  Medical stability  Nursing      PT Lack of/limited family support Patient's son can provide PRN assist at home only  OT Inaccessible home environment, Decreased caregiver support, Lack of/limited family support    SLP      SW Decreased caregiver support Son works 6p-6a, mother at patient's home has aide assistance a couple  of hours a day   Team Discharge Planning: Destination: PT-Home ,OT- Home , SLP-Home Projected Follow-up: PT-Home health PT, OT-  24 hour supervision/assistance, Home health OT, SLP-(TBD) Projected Equipment Needs: PT- , OT- 3 in 1 bedside comode, Tub/shower seat, SLP-None recommended by SLP Equipment Details: PT-Patient has no DME, will determine needs based on patient progress., OT-pt reports having shower seat, however need to confirm with son Patient/family involved in discharge planning: PT- Patient,  OT-Patient, SLP-Patient  MD ELOS: 14-17 days. Medical Rehab Prognosis:  Good Assessment: 64 year old right-handed male with history of diabetes mellitus (a1c is 5.9) , chronic hyponatremia, hypertension, remote tobacco use, CLL/small B-cell lymphoma followed by Dr. Irene Limbo with last chemotherapy October 2020.  Per chart review patient lives with a 47 year old mother.  Independent prior to admission.  1 level home with ramped entrance.  Mother has an aide that assists her for a couple of hours daily Patient reports being on disability due to [place] in early 2000s. Presented 08/29/2019 with left-sided weakness and mild dysarthria.  Admission labs with hemoglobin 9.8, sodium 128, urine drug screen negative, SARS coronavirus negative, hemoglobin 9.8, hematocrit 28.8, platelet 310,000, WBC 4.3.  Cranial CT scan unremarkable.  Patient did not receive TPA.  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  CT angiogram of head and neck with no emergent large vessel occlusion or high-grade stenosis.  CT of chest abdomen pelvis showed improving  lymphadenopathy in the chest abdomen pelvis.  Echocardiogram with ejection fraction 65% without emboli.  Neurology follow-up currently maintained on Plavix for CVA prophylaxis.  Patient was placed in [place] factor XI inhibitor stroke prevention trial.  Persistent hyponatremia and initially maintained on normal saline.  Tolerating a regular consistency diet.  Patient with resulting  Patient with resulting functional deficits with mobility, transfers, endurance, self-care, swallowing.  Will set goals for Supervision with PT/OT and Mod I with SLP.   Due to the current state of emergency, patients may not be receiving their 3-hours of Medicare-mandated therapy.

## 2019-09-03 NOTE — PMR Pre-admission (Signed)
PMR Admission Coordinator Pre-Admission Assessment  Patient: [name] [name] is an 64 y.o., male MRN: [id number] DOB: 02-07-1956 Height: 5\' 7"  (170.2 cm) Weight: 68 kg              Insurance Information HMO: yes    PPO:      PCP:      IPA:      80/20:      OTHER:  PRIMARY: [employer] Medicare      Policy#: [id number]      Subscriber: pt CM Name: [place]      Phone#: [phone] option 3     Fax#: [phone] Pre-Cert#: [id number]      Employer: approved for 7 days Benefits:  Phone #: [phone]     Name: 1/12 Eff. Date: 08/23/2019     Deduct: none      Out of Pocket Max: $3600      Life Max: none CIR: $295 co pay per day days 1 until 5      SNF: no co pay days 1 until 20; $184 co pay per day days 21 until 40; no co pay days 41 until 100 Outpatient: $30 per visit     Co-Pay: visits limited by medical neccesity Home Health: 100%      Co-Pay: visits by medical neccesity DME: 80%     Co-Pay: 20% Providers: In network  SECONDARY: none       Medicaid Application Date:       Case Manager:  Disability Application Date:       Case Worker:   The "Data Collection Information Summary" for patients in Inpatient Rehabilitation Facilities with attached "Privacy Act [place] Records" was provided and verbally reviewed with: Patient  Emergency Contact Information Contact Information    Name Relation Home Work Mobile   jones,Vernal Mother   [phone]     Current Medical History  Patient Admitting Diagnosis: CVA  History of Present Illness: 64 year old right-handed male with history of diabetes mellitus, chronic hyponatremia, hypertension, remote tobacco use, CLL/small B-cell lymphoma followed by Dr. Irene Limbo with last chemotherapy October 2020.   Presented 08/29/2019 with left-sided weakness and mild dysarthria.  Admission labs with hemoglobin 9.8, sodium 128, urine drug screen negative, SARS coronavirus negative, hemoglobin 9.8, hematocrit 28.8, platelet 310,000, WBC 4.3.  Cranial CT scan  unremarkable.  Patient did not receive TPA.  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  CT angiogram of head and neck with no emergent large vessel occlusion or high-grade stenosis.  CT of chest abdomen pelvis showed improving lymphadenopathy in the chest abdomen pelvis.  Echocardiogram with ejection fraction 65% without emboli.  Neurology follow-up currently maintained on Plavix for CVA prophylaxis.  Patient was placed in [facility] bayer factor XI inhibitor stroke prevention trial.  Subcutaneous Lovenox for DVT prophylaxis.  Persistent hyponatremia 126-130 and initially maintained on normal saline.  Tolerating a regular consistency diet.    Complete NIHSS TOTAL: 7 Glasgow Coma Scale Score: 15  Past Medical History  Past Medical History:  Diagnosis Date  . Allergic rhinitis, cause unspecified   . Backache, unspecified   . Body mass index 33.0-33.9, adult   . Diabetes mellitus without complication ([place])    Type II  . Elevated blood pressure reading without diagnosis of hypertension   . Esophageal reflux   . Generalized pain   . Heartburn   . Insomnia, unspecified   . 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 Other abnormal blood chemistry   .  Other and unspecified hyperlipidemia   . Other dyspnea and respiratory abnormality   . Other nonspecific findings on examination of blood(790.99)   . Pain in joint, lower leg   . Pneumonia    1968  . Sleep apnea    Does not use or own a CPAP  . Tobacco use disorder   . Unspecified essential hypertension     Family History  family history includes Cancer in his paternal uncle; Diabetes in his mother; Hypertension in his mother.  Prior Rehab/Hospitalizations:  Has the patient had prior rehab or hospitalizations prior to admission? Yes  Has the patient had major surgery during 100 days prior to admission? No  Current Medications   Current Facility-Administered Medications:  .    stroke: mapping our early stages of recovery book, , Does not apply, Once, Etta Quill, DO, Stopped at 08/30/19 0005 .  [COMPLETED] sodium chloride 0.9 % bolus 500 mL, 500 mL, Intravenous, Once, Stopped at 08/29/19 2245 **FOLLOWED BY** 0.9 %  sodium chloride infusion, 100 mL/hr, Intravenous, Continuous, Carmin Muskrat, MD, Last Rate: 100 mL/hr at 09/03/19 0228, 100 mL/hr at 09/03/19 0228 .  acetaminophen (TYLENOL) tablet 650 mg, 650 mg, Oral, Q4H PRN, 650 mg at 09/03/19 0105 **OR** acetaminophen (TYLENOL) 160 MG/5ML solution 650 mg, 650 mg, Per Tube, Q4H PRN **OR** acetaminophen (TYLENOL) suppository 650 mg, 650 mg, Rectal, Q4H PRN, Etta Quill, DO .  atorvastatin (LIPITOR) tablet 20 mg, 20 mg, Oral, Daily, Jennette Kettle M, DO, 20 mg at 09/03/19 [id number] .  clopidogrel (PLAVIX) tablet 75 mg, 75 mg, Oral, Daily, Garvin Fila, MD, 75 mg at 09/03/19 0923 .  enoxaparin (LOVENOX) injection 40 mg, 40 mg, Subcutaneous, Q24H, Gardner, Jared M, DO, 40 mg at 09/02/19 2306 .  insulin aspart (novoLOG) injection 0-9 Units, 0-9 Units, Subcutaneous, TID WC, Etta Quill, DO, 1 Units at 08/30/19 1122 .  STUDY - PACIFIC (STROKE) - BAY [id number] ([place]) 5, 15, 25 mg or placebo tablet (PI-Sethi), 1 tablet, Oral, Daily, 1 tablet at 09/03/19 1007 **AND** Study - PACIFIC (STROKE) - BAY [id number] ([place]) 5, 15, 25 mg or placebo tablet (PI-Sethi), 1 tablet, Oral, Daily, Leonie Man, Pramod S, MD, 1 tablet at 09/03/19 1007 .  zolpidem (AMBIEN) tablet 10 mg, 10 mg, Oral, QHS PRN, Cristal Ford, DO, 10 mg at 09/03/19 0105  Patients Current Diet:  Diet Order            Diet Carb Modified Fluid consistency: Thin; Room service appropriate? Yes  Diet effective now              Precautions / Restrictions Precautions Precautions: Fall Precaution Comments: L hemiparesis Restrictions Weight Bearing Restrictions: No   Has the patient had 2 or more falls or a fall with injury in the past year?No  Prior  Activity Level Community (5-7x/wk): Independent and driving pta  Prior Functional Level Prior Function Level of Independence: Independent Comments: pt reports being retired from working for [employer], assists his 7 y/o mother as needed. Pt's mother has an aide that assists her in the am for a couple of hours.  Self Care: Did the patient need help bathing, dressing, using the toilet or eating?  Independent  Indoor Mobility: Did the patient need assistance with walking from room to room (with or without device)? Independent  Stairs: Did the patient need assistance with internal or external stairs (with or without device)? Independent  Functional Cognition: Did the patient need help planning regular tasks  such as shopping or remembering to take medications? Independent  Home Assistive Devices / Equipment Home Equipment: None  Prior Device Use: Indicate devices/aids used by the patient prior to current illness, exacerbation or injury? None of the above  Current Functional Level Cognition  Arousal/Alertness: Awake/alert Overall Cognitive Status: Impaired/Different from baseline Orientation Level: Oriented X4 Following Commands: Follows multi-step commands with increased time Safety/Judgement: Decreased awareness of safety General Comments: pt with slow processing, requiring increased time to respond Attention: Selective Selective Attention: Appears intact Memory: Impaired Memory Impairment: Other (comment)(working memory) Awareness: Appears intact Problem Solving: Impaired Problem Solving Impairment: Verbal complex    Extremity Assessment (includes Sensation/Coordination)  Upper Extremity Assessment: LUE deficits/detail LUE Deficits / Details: flaccid, trace activation noted in shoulder, pt reports pain in L wrist, reports previous carpal tunnel release surgery; LUE Sensation: (reports WFL, but then voicing "it feel numb" ) LUE Coordination: decreased fine motor,  decreased gross motor  Lower Extremity Assessment: Defer to PT evaluation LLE Deficits / Details: noted tight hamstrings and hip, pt grimacing with discomfort during ROM; LLE Sensation: WNL    ADLs  Overall ADL's : Needs assistance/impaired Grooming: Minimal assistance, Sitting Upper Body Bathing: Moderate assistance, Sitting Lower Body Bathing: Maximal assistance, +2 for physical assistance, +2 for safety/equipment, Sit to/from stand Upper Body Dressing : Moderate assistance, Sitting Lower Body Dressing: Moderate assistance, Maximal assistance, Sit to/from stand, +2 for physical assistance Toilet Transfer: Moderate assistance, Ambulation, +2 for physical assistance, +2 for safety/equipment Toilet Transfer Details (indicate cue type and reason): took 4 side steps towards right to pivot to the recliner, pt required modA+2  Functional mobility during ADLs: Moderate assistance, +2 for physical assistance, Minimal assistance General ADL Comments: limited by L hemiparesis, impaired balance, decreased activity tolerance    Mobility  Overal bed mobility: Needs Assistance Bed Mobility: Supine to Sit Supine to sit: HOB elevated, Min assist Sit to supine: Max assist, HOB elevated General bed mobility comments: +rail, cues for sequencing, assist to elevate trunk    Transfers  Overall transfer level: Needs assistance Equipment used: 2 person hand held assist Transfers: Sit to/from Stand, Stand Pivot Transfers Sit to Stand: Min assist, +2 physical assistance Stand pivot transfers: Mod assist, +2 physical assistance General transfer comment: +2 minA to powerup into standing;modA+2 for pivot toward recliner on right, required L knee blocking    Ambulation / Gait / Stairs / Wheelchair Mobility  Ambulation/Gait General Gait Details: pre-gait activities in stance with BUE support, lateral weight shifts, +2 min/mod assist.    Posture / Balance Dynamic Sitting Balance Sitting balance - Comments:  static sitting balance, preference to R UE support at EOB  Balance Overall balance assessment: Needs assistance Sitting-balance support: Feet supported, No upper extremity supported Sitting balance-Leahy Scale: Fair Sitting balance - Comments: static sitting balance, preference to R UE support at EOB  Standing balance support: Single extremity supported, During functional activity Standing balance-Leahy Scale: Poor Standing balance comment: reliant on external support    Special needs/care consideration BiPAP/CPAP CPM Continuous Drip IV yes at 100 cc/hr Dialysis         Life Vest Oxygen Special Bed Trach Size Wound Vac  Skin                              Bowel mgmt: no LBM documented Bladder mgmt: external catheter Diabetic mgmt Hgb A1c 5.9 Behavioral consideration  Chemo/radiation  Yes chemo 05/2019 Designated visitor is son, Cecilie Lowers  Previous Home Environment  Living Arrangements: (Pt's Mom lives with patietn as well as his 56 year old son, )  Lives With: Family Available Help at Discharge: Family(Craig works 6 pm until 6 am) Type of Home: House Home Layout: One level Home Access: Ramped entrance [employer] Shower/Tub: Multimedia programmer: Standard Bathroom Accessibility: Yes How Accessible: Accessible via walker, Accessible via wheelchair [place]: No  Discharge [place] for Discharge Living Setting: Patient's home(pt's Mom and his 22 year old son, Cecilie Lowers, live with patient) Type of Home at Discharge: House Discharge Home Layout: One level Discharge Home Access: Ramped entrance(for his Mom who uses a RW) Discharge Bathroom Shower/Tub: Walk-in shower Discharge Bathroom Toilet: Standard Discharge Bathroom Accessibility: Yes How Accessible: Accessible via walker, Accessible via wheelchair Does the patient have any problems obtaining your medications?: No  Social/Family/Support Systems Patient Roles: Parent, Caregiver Contact  Information: son , Cecilie Lowers Anticipated Caregiver: son Anticipated Caregiver's Contact Information: [phone] Ability/Limitations of Caregiver: Cecilie Lowers works 6 pm until 6 am Caregiver Availability: Other (Comment) Discharge Plan Discussed with Primary Caregiver: Yes Is Caregiver In Agreement with Plan?: Yes Does Caregiver/Family have Issues with Lodging/Transportation while Pt is in Rehab?: No  Goals/Additional Needs Patient/Family Goal for Rehab: Mod I to superivsion PT and OT at wheelchair level, Mod I with SLP Expected length of stay: [facility] 2 to 3 weeks Pt/Family Agrees to Admission and willing to participate: Yes Program Orientation Provided & Reviewed with Pt/Caregiver Including Roles  & Responsibilities: Yes  Decrease burden of Care through IP rehab admission:   Possible need for SNF placement upon discharge:  Patient Condition: This patient's medical and functional status has changed since the consult dated: 08/30/2019 in which the Rehabilitation Physician determined and documented that the patient's condition is appropriate for intensive rehabilitative care in an inpatient rehabilitation facility. See "History of Present Illness" (above) for medical update. Functional changes are: overall min to mod assist. Patient's medical and functional status update has been discussed with the Rehabilitation physician and patient remains appropriate for inpatient rehabilitation. Will admit to inpatient rehab today.  Preadmission Screen Completed By:  Cleatrice Burke, RN, 09/03/2019 10:33 AM ______________________________________________________________________   Discussed status with Dr. Dagoberto Ligas on 09/03/2019 at  32 and received approval for admission today.  Admission Coordinator:  Cleatrice Burke, time [id number] Date 09/03/2019

## 2019-09-03 NOTE — Progress Notes (Signed)
[name] Gong, RN  Rehab Admission Coordinator  Physical Medicine and Rehabilitation  PMR Pre-admission  Signed  Date of Service:  09/03/2019 10:33 AM      Related encounter: ED to Hosp-Admission (Discharged) from 08/29/2019 in [facility]      Signed        Show:Clear all [x] Manual[x] Template[x] Copied  Added by: [x] Julious Payer Vertis Kelch, RN  [] Hover for details PMR Admission Coordinator Pre-Admission Assessment   Patient: [name] [name] is an 64 y.o., male MRN: [id number] DOB: 1956/06/14 Height: 5\' 7"  (170.2 cm) Weight: 68 kg                                                                                                                                                  Insurance Information HMO: yes    PPO:      PCP:      IPA:      80/20:      OTHER:  PRIMARY: [employer] Medicare      Policy#: [id number]      Subscriber: pt CM Name: [name] [name]      Phone#: [phone] option 3     Fax#: [phone] Pre-Cert#: [id number]      Employer: approved for 7 days Benefits:  Phone #: [phone]     Name: 1/12 Eff. Date: 08/23/2019     Deduct: none      Out of Pocket Max: $3600      Life Max: none CIR: $295 co pay per day days 1 until 5      SNF: no co pay days 1 until 20; $184 co pay per day days 21 until 40; no co pay days 41 until 100 Outpatient: $30 per visit     Co-Pay: visits limited by medical neccesity Home Health: 100%      Co-Pay: visits by medical neccesity DME: 80%     Co-Pay: 20% Providers: In network  SECONDARY: none        Medicaid Application Date:       Case Manager:  Disability Application Date:       Case Worker:    The "Data Collection Information Summary" for patients in Inpatient Rehabilitation Facilities with attached "Privacy Act Home Records" was provided and verbally reviewed with: Patient   Emergency Contact Information         Contact Information     Name Relation Home Work Mobile    [name] [name] Mother     [phone]      Current Medical History  Patient Admitting Diagnosis: CVA   History of Present Illness: 64 year old right-handed male with history of diabetes mellitus, chronic hyponatremia, hypertension, remote tobacco use, CLL/small B-cell lymphoma followed by Dr. Irene Limbo with last chemotherapy October 2020.   Presented 08/29/2019 with left-sided weakness and mild dysarthria.  Admission labs with hemoglobin 9.8, sodium 128, urine drug  screen negative, SARS coronavirus negative, hemoglobin 9.8, hematocrit 28.8, platelet 310,000, WBC 4.3.  Cranial CT scan unremarkable.  Patient did not receive TPA.  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  CT angiogram of head and neck with no emergent large vessel occlusion or high-grade stenosis.  CT of chest abdomen pelvis showed improving lymphadenopathy in the chest abdomen pelvis.  Echocardiogram with ejection fraction 65% without emboli.  Neurology follow-up currently maintained on Plavix for CVA prophylaxis.  Patient was placed in [place] bayer factor XI inhibitor stroke prevention trial.  Subcutaneous Lovenox for DVT prophylaxis.  Persistent hyponatremia 126-130 and initially maintained on normal saline.  Tolerating a regular consistency diet.     Complete NIHSS TOTAL: 7 Glasgow Coma Scale Score: 15   Past Medical History      Past Medical History:  Diagnosis Date  . Allergic rhinitis, cause unspecified    . Backache, unspecified    . Body mass index 33.0-33.9, adult    . Diabetes mellitus without complication ([place])      Type II  . Elevated blood pressure reading without diagnosis of hypertension    . Esophageal reflux    . Generalized pain    . Heartburn    . Insomnia, unspecified    . Nonspecific reaction to tuberculin skin test without active tuberculosis(795.51)    . Other abnormal blood chemistry    . Other and unspecified hyperlipidemia    . Other dyspnea and respiratory abnormality    . Other nonspecific findings on examination of  blood(790.99)    . Pain in joint, lower leg    . Pneumonia      1968  . Sleep apnea      Does not use or own a CPAP  . Tobacco use disorder    . Unspecified essential hypertension        Family History  family history includes Cancer in his paternal uncle; Diabetes in his mother; Hypertension in his mother.   Prior Rehab/Hospitalizations:  Has the patient had prior rehab or hospitalizations prior to admission? Yes   Has the patient had major surgery during 100 days prior to admission? No   Current Medications    Current Facility-Administered Medications:  .   stroke: mapping our early stages of recovery book, , Does not apply, Once, Etta Quill, DO, Stopped at 08/30/19 0005 .  [COMPLETED] sodium chloride 0.9 % bolus 500 mL, 500 mL, Intravenous, Once, Stopped at 08/29/19 2245 **FOLLOWED BY** 0.9 %  sodium chloride infusion, 100 mL/hr, Intravenous, Continuous, Carmin Muskrat, MD, Last Rate: 100 mL/hr at 09/03/19 0228, 100 mL/hr at 09/03/19 0228 .  acetaminophen (TYLENOL) tablet 650 mg, 650 mg, Oral, Q4H PRN, 650 mg at 09/03/19 0105 **OR** acetaminophen (TYLENOL) 160 MG/5ML solution 650 mg, 650 mg, Per Tube, Q4H PRN **OR** acetaminophen (TYLENOL) suppository 650 mg, 650 mg, Rectal, Q4H PRN, Etta Quill, DO .  atorvastatin (LIPITOR) tablet 20 mg, 20 mg, Oral, Daily, Jennette Kettle M, DO, 20 mg at 09/03/19 [id number] .  clopidogrel (PLAVIX) tablet 75 mg, 75 mg, Oral, Daily, Garvin Fila, MD, 75 mg at 09/03/19 0923 .  enoxaparin (LOVENOX) injection 40 mg, 40 mg, Subcutaneous, Q24H, Gardner, Jared M, DO, 40 mg at 09/02/19 2306 .  insulin aspart (novoLOG) injection 0-9 Units, 0-9 Units, Subcutaneous, TID WC, Etta Quill, DO, 1 Units at 08/30/19 1122 .  STUDY - PACIFIC (STROKE) - BAY [id number] ([place]) 5, 15, 25 mg or placebo tablet (PI-Sethi),  1 tablet, Oral, Daily, 1 tablet at 09/03/19 1007 **AND** Study - PACIFIC (STROKE) - BAY [id number] ([place]) 5, 15, 25 mg or placebo  tablet (PI-Sethi), 1 tablet, Oral, Daily, Leonie Man, Pramod S, MD, 1 tablet at 09/03/19 1007 .  zolpidem (AMBIEN) tablet 10 mg, 10 mg, Oral, QHS PRN, Cristal Ford, DO, 10 mg at 09/03/19 0105   Patients Current Diet:     Diet Order                      Diet Carb Modified Fluid consistency: Thin; Room service appropriate? Yes  Diet effective now                   Precautions / Restrictions Precautions Precautions: Fall Precaution Comments: L hemiparesis Restrictions Weight Bearing Restrictions: No    Has the patient had 2 or more falls or a fall with injury in the past year?No   Prior Activity Level Community (5-7x/wk): Independent and driving pta   Prior Functional Level Prior Function Level of Independence: Independent Comments: pt reports being retired from working for [employer], assists his 12 y/o mother as needed. Pt's mother has an aide that assists her in the am for a couple of hours.   Self Care: Did the patient need help bathing, dressing, using the toilet or eating?  Independent   Indoor Mobility: Did the patient need assistance with walking from room to room (with or without device)? Independent   Stairs: Did the patient need assistance with internal or external stairs (with or without device)? Independent   Functional Cognition: Did the patient need help planning regular tasks such as shopping or remembering to take medications? Independent   Home Assistive Devices / Equipment Home Equipment: None   Prior Device Use: Indicate devices/aids used by the patient prior to current illness, exacerbation or injury? None of the above   Current Functional Level Cognition   Arousal/Alertness: Awake/alert Overall Cognitive Status: Impaired/Different from baseline Orientation Level: Oriented X4 Following Commands: Follows multi-step commands with increased time Safety/Judgement: Decreased awareness of safety General Comments: pt with slow processing,  requiring increased time to respond Attention: Selective Selective Attention: Appears intact Memory: Impaired Memory Impairment: Other (comment)(working memory) Awareness: Appears intact Problem Solving: Impaired Problem Solving Impairment: Verbal complex    Extremity Assessment (includes Sensation/Coordination)   Upper Extremity Assessment: LUE deficits/detail LUE Deficits / Details: flaccid, trace activation noted in shoulder, pt reports pain in L wrist, reports previous carpal tunnel release surgery; LUE Sensation: (reports WFL, but then voicing "it feel numb" ) LUE Coordination: decreased fine motor, decreased gross motor  Lower Extremity Assessment: Defer to PT evaluation LLE Deficits / Details: noted tight hamstrings and hip, pt grimacing with discomfort during ROM; LLE Sensation: WNL     ADLs   Overall ADL's : Needs assistance/impaired Grooming: Minimal assistance, Sitting Upper Body Bathing: Moderate assistance, Sitting Lower Body Bathing: Maximal assistance, +2 for physical assistance, +2 for safety/equipment, Sit to/from stand Upper Body Dressing : Moderate assistance, Sitting Lower Body Dressing: Moderate assistance, Maximal assistance, Sit to/from stand, +2 for physical assistance Toilet Transfer: Moderate assistance, Ambulation, +2 for physical assistance, +2 for safety/equipment Toilet Transfer Details (indicate cue type and reason): took 4 side steps towards right to pivot to the recliner, pt required modA+2  Functional mobility during ADLs: Moderate assistance, +2 for physical assistance, Minimal assistance General ADL Comments: limited by L hemiparesis, impaired balance, decreased activity tolerance     Mobility   Overal  bed mobility: Needs Assistance Bed Mobility: Supine to Sit Supine to sit: HOB elevated, Min assist Sit to supine: Max assist, HOB elevated General bed mobility comments: +rail, cues for sequencing, assist to elevate trunk     Transfers    Overall transfer level: Needs assistance Equipment used: 2 person hand held assist Transfers: Sit to/from Stand, Stand Pivot Transfers Sit to Stand: Min assist, +2 physical assistance Stand pivot transfers: Mod assist, +2 physical assistance General transfer comment: +2 minA to powerup into standing;modA+2 for pivot toward recliner on right, required L knee blocking     Ambulation / Gait / Stairs / Wheelchair Mobility   Ambulation/Gait General Gait Details: pre-gait activities in stance with BUE support, lateral weight shifts, +2 min/mod assist.     Posture / Balance Dynamic Sitting Balance Sitting balance - Comments: static sitting balance, preference to R UE support at EOB  Balance Overall balance assessment: Needs assistance Sitting-balance support: Feet supported, No upper extremity supported Sitting balance-Leahy Scale: Fair Sitting balance - Comments: static sitting balance, preference to R UE support at EOB  Standing balance support: Single extremity supported, During functional activity Standing balance-Leahy Scale: Poor Standing balance comment: reliant on external support     Special needs/care consideration BiPAP/CPAP CPM Continuous Drip IV yes at 100 cc/hr Dialysis         Life Vest Oxygen Special Bed Trach Size Wound Vac  Skin                              Bowel mgmt: no LBM documented Bladder mgmt: external catheter Diabetic mgmt Hgb A1c 5.9 Behavioral consideration  Chemo/radiation  Yes chemo 05/2019 Designated visitor is son, [name] [name]    Previous [place]: (Pt's Mom lives with patietn as well as his 30 year old son, )  Lives With: Family Available Help at Discharge: Family(Craig works 6 pm until 6 am) Type of Home: House Home Layout: One level Home Access: Ramped entrance [employer] Shower/Tub: Multimedia programmer: Standard Bathroom Accessibility: Yes How Accessible: Accessible via walker, Accessible via  wheelchair Home Care Services: No   Discharge Living Setting Plans for Discharge Living Setting: Patient's home(pt's Mom and his 46 year old son, [name] [name], live with patient) Type of Home at Discharge: House Discharge Home Layout: One level Discharge Home Access: Ramped entrance(for his Mom who uses a RW) Discharge Bathroom Shower/Tub: Walk-in shower Discharge Bathroom Toilet: Standard Discharge Bathroom Accessibility: Yes How Accessible: Accessible via walker, Accessible via wheelchair Does the patient have any problems obtaining your medications?: No   Social/Family/Support Systems Patient Roles: Parent, Caregiver Contact Information: son , [name] [name] Anticipated Caregiver: son Anticipated Caregiver's Contact Information: [phone] Ability/Limitations of Caregiver: [name] [name] works 6 pm until 6 am Caregiver Availability: Other (Comment) Discharge Plan Discussed with Primary Caregiver: Yes Is Caregiver In Agreement with Plan?: Yes Does Caregiver/Family have Issues with Lodging/Transportation while Pt is in Rehab?: No   Goals/Additional Needs Patient/Family Goal for Rehab: Mod I to superivsion PT and OT at wheelchair level, Mod I with SLP Expected length of stay: [facility]OS 2 to 3 weeks Pt/Family Agrees to Admission and willing to participate: Yes Program Orientation Provided & Reviewed with Pt/Caregiver Including Roles  & Responsibilities: Yes   Decrease burden of Care through IP rehab admission:    Possible need for SNF placement upon discharge:   Patient Condition: This patient's medical and functional status has changed since the consult dated: 08/30/2019 in which  the Rehabilitation Physician determined and documented that the patient's condition is appropriate for intensive rehabilitative care in an inpatient rehabilitation facility. See "History of Present Illness" (above) for medical update. Functional changes are: overall min to mod assist. Patient's medical and functional status update has  been discussed with the Rehabilitation physician and patient remains appropriate for inpatient rehabilitation. Will admit to inpatient rehab today.   Preadmission Screen Completed By:  Cleatrice Burke, RN, 09/03/2019 10:33 AM ______________________________________________________________________   Discussed status with Dr. Dagoberto Ligas on 09/03/2019 at  93 and received approval for admission today.   Admission Coordinator:  Cleatrice Burke, time [id number] Date 09/03/2019         Cosigned by: Courtney Heys, MD at 09/03/2019 11:28 AM  Revision History

## 2019-09-03 NOTE — Discharge Summary (Signed)
Physician Discharge Summary  [name] [name] [id number] DOB: 1956/01/06 DOA: 08/29/2019  PCP: Bernerd Limbo, MD  Admit date: 08/29/2019 Discharge date: 09/03/2019  Time spent: 45 minutes  Recommendations for Outpatient Follow-up:  Patient will be discharged to inpatient rehabilitation, continue physical, occupational, speech therapy.  Patient will need to follow up with primary care provider within one week of discharge.  Follow-up with neurology in 1 month.  Patient should continue medications as prescribed.  Patient should follow a heart healthy/carb modified diet.   Discharge Diagnoses:  Acute ischemic CVA Hyponatremia, chronic Hyperkalemia CLL/small B-cell lymphoma Diabetes mellitus, type II Essential Hypertension  Discharge Condition: Stable  Diet recommendation: heart healthy/carb modified diet  Filed Weights   08/29/19 1923  Weight: 68 kg    History of present illness:  on 08/29/2019 by Dr. Jennette Kettle Myan R Davisis a 64 y.o.malewith medical history significant ofCLL / small B cell lymphoma, DM2, HTN. Patient with L sided weakness in upper and lower extremities onset yesterday. Unclear if patients symptoms changed throughout day, but they are persistent. No confusion, no fever, no SOB.  Hospital Course:  Acute ischemic CVA -CT head was unremarkable -MRI brain: Acute infarction of the right paramedian pons -Echocardiogram EF 60-65% -LDL 85 hemoglobin A1c 5.9 -PT recommended CIR -Inpatient rehab consulted and appreciated -Neurology consulted and appreciated, placed on plavix and enrolled in Hart[facility] Factor XI inhibitor stroke prevention trial -Continue statin, plavix  Hyponatremia, chronic -Review of chart shows that patient did have a low sodium in Sep through November 2020 -sodium 126 -Continue to monitor BMP  Hyperkalemia -resolved with replacement -Continue to monitor BMP  CLL/small B-cell lymphoma -Last chemotherapy appears to occurred in  October 2020 -Patient sees Dr. Irene Limbo -Discussed with Dr. Irene Limbo, imaging shows improvement. Patient to follow up with him as an outpatient.   Diabetes mellitus, type II -Patient tells me he no longer takes metformin due to the news he was hearing -he states he is diet controlled -A1c 5.9  Essential Hypertension -Allow for permissive hypertension given acute CVA -BP stable  Consultants Neurology Inpatient rehab  Procedures  Echocardiogram  Discharge Exam: Vitals:   09/03/19 0354 09/03/19 0849  BP: 128/71 129/68  Pulse: (!) 59 61  Resp: 18 16  Temp: 98.3 F (36.8 C) 98.6 F (37 C)  SpO2: 98% 100%     General: Well developed, well nourished, NAD  HEENT: NCAT, mucous membranes moist.  Cardiovascular: S1 S2 auscultated, RRR  Respiratory: Clear to auscultation bilaterally  Abdomen: Soft, nontender, nondistended, + bowel sounds  Extremities: warm dry without cyanosis clubbing or edema  Neuro: AAOx3, left sided weakness 2-3/5  Psych: Pleasant, appropriate mood and affect   Discharge Instructions Discharge Instructions    Discharge instructions   Complete by: As directed    Patient will be discharged to inpatient rehabilitation, continue physical, occupational, speech therapy.  Patient will need to follow up with primary care provider within one week of discharge.  Follow-up with neurology in 1 month.  Patient should continue medications as prescribed.  Patient should follow a heart healthy/carb modified diet.     Allergies as of 09/03/2019      Reactions   Lisinopril Itching   Tuberculin Tests Swelling      Medication List    STOP taking these medications   acyclovir 400 MG tablet Commonly known as: ZOVIRAX   allopurinol 300 MG tablet Commonly known as: ZYLOPRIM   dexamethasone 4 MG tablet Commonly known as: DECADRON   ondansetron 8  MG tablet Commonly known as: Zofran   prochlorperazine 10 MG tablet Commonly known as: COMPAZINE     TAKE these  medications   amLODipine 10 MG tablet Commonly known as: NORVASC Take 10 mg by mouth daily.   atorvastatin 20 MG tablet Commonly known as: LIPITOR Take 20 mg by mouth daily.   clopidogrel 75 MG tablet Commonly known as: PLAVIX Take 1 tablet (75 mg total) by mouth daily. Start taking on: September 04, 2019   cyclobenzaprine 10 MG tablet Commonly known as: FLEXERIL Take 10 mg by mouth at bedtime.   losartan 50 MG tablet Commonly known as: COZAAR Take 50 mg by mouth daily.   oxyCODONE-acetaminophen 5-325 MG tablet Commonly known as: PERCOCET/ROXICET Take 1 tablet by mouth 3 (three) times daily as needed for pain.   pantoprazole 40 MG tablet Commonly known as: PROTONIX Take 40 mg by mouth at bedtime.   potassium chloride SA 20 MEQ tablet Commonly known as: KLOR-CON Take 20 mEq by mouth daily.   zolpidem 10 MG tablet Commonly known as: AMBIEN Take 10 mg by mouth at bedtime.      Allergies  Allergen Reactions  . Lisinopril Itching  . Tuberculin Tests Swelling   Follow-up Information    Bernerd Limbo, MD. Schedule an appointment as soon as possible for a visit in 1 week(s).   Specialty: Family Medicine Why: Hospital follow up Contact information: [place] [address] [phone]        Garvin Fila, MD. Schedule an appointment as soon as possible for a visit in 4 week(s).   Specialties: Neurology, Radiology Why: Hospital follow up, stroke clinic Contact information: [address] [place] [address] [phone]        Brunetta Genera, MD. Call.   Specialties: Hematology, Oncology Contact information: [place] [address] [phone]            The results of significant diagnostics from this hospitalization (including imaging, microbiology, ancillary and laboratory) are listed below for reference.    Significant Diagnostic Studies: CT ANGIO  HEAD W OR WO CONTRAST  Result Date: 08/30/2019 CLINICAL DATA:  Stroke follow-up EXAM: CT ANGIOGRAPHY HEAD AND NECK TECHNIQUE: Multidetector CT imaging of the head and neck was performed using the standard protocol during bolus administration of intravenous contrast. Multiplanar CT image reconstructions and MIPs were obtained to evaluate the vascular anatomy. Carotid stenosis measurements (when applicable) are obtained utilizing NASCET criteria, using the distal internal carotid diameter as the denominator. CONTRAST:  123mL OMNIPAQUE IOHEXOL 350 MG/ML SOLN COMPARISON:  Brain MRI 08/29/2019 FINDINGS: CTA NECK FINDINGS SKELETON: There is no bony spinal canal stenosis. No lytic or blastic lesion. OTHER NECK: Numerous bilateral cervical and supraclavicular lymph nodes. The largest are in the supraclavicular fossa and measure up to 9 mm. UPPER CHEST: There is scarring in the suprahilar left lung. AORTIC ARCH: There is minimal calcific atherosclerosis of the aortic arch. There is no aneurysm, dissection or hemodynamically significant stenosis of the visualized portion of the aorta. Conventional 3 vessel aortic branching pattern. The visualized proximal subclavian arteries are widely patent. RIGHT CAROTID SYSTEM: Normal without aneurysm, dissection or stenosis. LEFT CAROTID SYSTEM: Normal without aneurysm, dissection or stenosis. VERTEBRAL ARTERIES: Codominant configuration. Both origins are clearly patent. There is no dissection, occlusion or flow-limiting stenosis to the skull base (V1-V3 segments). CTA HEAD FINDINGS POSTERIOR CIRCULATION: --Vertebral arteries: Normal V4 segments. --Posterior inferior cerebellar arteries (PICA): Patent  origins from the vertebral arteries. --Anterior inferior cerebellar arteries (AICA): Patent origins from the basilar artery. --Basilar artery: Normal. --Superior cerebellar arteries: Normal. --Posterior cerebral arteries: Normal. Both originate from the basilar artery. Posterior  communicating arteries (p-comm) are diminutive or absent. ANTERIOR CIRCULATION: --Intracranial internal carotid arteries: Normal. --Anterior cerebral arteries (ACA): Normal. Both A1 segments are present. Patent anterior communicating artery (a-comm). --Middle cerebral arteries (MCA): Normal. VENOUS SINUSES: As permitted by contrast timing, patent. ANATOMIC VARIANTS: None Review of the MIP images confirms the above findings. IMPRESSION: 1. No emergent large vessel occlusion or high-grade stenosis of the intracranial arteries. 2. Numerous bilateral cervical and supraclavicular lymph nodes. The largest are in the supraclavicular fossa and measure up to 9 mm, compatible with known lymphoma. Electronically Signed   By: Ulyses Jarred M.D.   On: 08/30/2019 03:03   CT HEAD WO CONTRAST  Result Date: 08/29/2019 CLINICAL DATA:  64 year old male with focal neurologic deficit. EXAM: CT HEAD WITHOUT CONTRAST TECHNIQUE: Contiguous axial images were obtained from the base of the skull through the vertex without intravenous contrast. COMPARISON:  None. FINDINGS: Brain: The ventricles and sulci appropriate size for patient's age. The gray-white matter discrimination is preserved. There is no acute intracranial hemorrhage. No mass effect or midline shift. No extra-axial fluid collection. Vascular: No hyperdense vessel or unexpected calcification. Skull: Normal. Negative for fracture or focal lesion. Sinuses/Orbits: Mild mucoperiosteal thickening of paranasal sinuses. The mastoid air cells are clear. Other: Prior fixation of left zygomatic fracture. IMPRESSION: Unremarkable noncontrast CT of the brain. Electronically Signed   By: Anner Crete M.D.   On: 08/29/2019 19:58   CT ANGIO NECK W OR WO CONTRAST  Result Date: 08/30/2019 CLINICAL DATA:  Stroke follow-up EXAM: CT ANGIOGRAPHY HEAD AND NECK TECHNIQUE: Multidetector CT imaging of the head and neck was performed using the standard protocol during bolus administration of  intravenous contrast. Multiplanar CT image reconstructions and MIPs were obtained to evaluate the vascular anatomy. Carotid stenosis measurements (when applicable) are obtained utilizing NASCET criteria, using the distal internal carotid diameter as the denominator. CONTRAST:  140mL OMNIPAQUE IOHEXOL 350 MG/ML SOLN COMPARISON:  Brain MRI 08/29/2019 FINDINGS: CTA NECK FINDINGS SKELETON: There is no bony spinal canal stenosis. No lytic or blastic lesion. OTHER NECK: Numerous bilateral cervical and supraclavicular lymph nodes. The largest are in the supraclavicular fossa and measure up to 9 mm. UPPER CHEST: There is scarring in the suprahilar left lung. AORTIC ARCH: There is minimal calcific atherosclerosis of the aortic arch. There is no aneurysm, dissection or hemodynamically significant stenosis of the visualized portion of the aorta. Conventional 3 vessel aortic branching pattern. The visualized proximal subclavian arteries are widely patent. RIGHT CAROTID SYSTEM: Normal without aneurysm, dissection or stenosis. LEFT CAROTID SYSTEM: Normal without aneurysm, dissection or stenosis. VERTEBRAL ARTERIES: Codominant configuration. Both origins are clearly patent. There is no dissection, occlusion or flow-limiting stenosis to the skull base (V1-V3 segments). CTA HEAD FINDINGS POSTERIOR CIRCULATION: --Vertebral arteries: Normal V4 segments. --Posterior inferior cerebellar arteries (PICA): Patent origins from the vertebral arteries. --Anterior inferior cerebellar arteries (AICA): Patent origins from the basilar artery. --Basilar artery: Normal. --Superior cerebellar arteries: Normal. --Posterior cerebral arteries: Normal. Both originate from the basilar artery. Posterior communicating arteries (p-comm) are diminutive or absent. ANTERIOR CIRCULATION: --Intracranial internal carotid arteries: Normal. --Anterior cerebral arteries (ACA): Normal. Both A1 segments are present. Patent anterior communicating artery (a-comm).  --Middle cerebral arteries (MCA): Normal. VENOUS SINUSES: As permitted by contrast timing, patent. ANATOMIC VARIANTS: None Review of the MIP images confirms  the above findings. IMPRESSION: 1. No emergent large vessel occlusion or high-grade stenosis of the intracranial arteries. 2. Numerous bilateral cervical and supraclavicular lymph nodes. The largest are in the supraclavicular fossa and measure up to 9 mm, compatible with known lymphoma. Electronically Signed   By: Ulyses Jarred M.D.   On: 08/30/2019 03:03   CT Chest W Contrast  Result Date: 08/28/2019 CLINICAL DATA:  Follow-up B-cell lymphoma after 2 cycles of induction chemo-immunotherapy EXAM: CT CHEST, ABDOMEN, AND PELVIS WITH CONTRAST TECHNIQUE: Multidetector CT imaging of the chest, abdomen and pelvis was performed following the standard protocol during bolus administration of intravenous contrast. CONTRAST:  135mL OMNIPAQUE IOHEXOL 300 MG/ML  SOLN COMPARISON:  PET-CT dated 05/15/2019 FINDINGS: CT CHEST FINDINGS Cardiovascular: The heart is normal in size. No pericardial effusion. No evidence of thoracic aortic aneurysm. Mild atherosclerotic calcifications of the aortic arch. Mediastinum/Nodes: Calcified mediastinal and bilateral perihilar nodes. Dominant 10 mm short axis right paratracheal node, unchanged. Mildly prominent bilateral axillary nodes, measuring up to 10 mm short axis, previously 2.2 cm. Visualized thyroid is unremarkable. Lungs/Pleura: Prior central left upper lobe/suprahilar nodule has resolved. Calcifications with scarring in the left perihilar region. Patchy opacity in the medial right lower lobe, measuring 1.5 x 7.4 cm (series 4/image 104), new. Patchy/nodular opacity inferiorly in the right middle lobe measuring up to 13 mm (series 4/image 117), new. No pleural effusion or pneumothorax. Musculoskeletal: Degenerative changes of the thoracic spine. CT ABDOMEN PELVIS FINDINGS Hepatobiliary: Liver is within normal limits. Gallbladder is  unremarkable. No intrahepatic or extrahepatic ductal dilatation. Pancreas: Within normal limits. Spleen: Spleen is normal in size. Adrenals/Urinary Tract: Adrenal glands are within normal limits. Small bilateral renal cysts, measuring up to 1.6 cm in the posterior right lower kidney. No hydronephrosis. Mildly thick-walled bladder, although underdistended. Stomach/Bowel: Stomach is notable for a tiny hiatal hernia. No evidence of bowel obstruction. Normal appendix (series 2/image 83). Vascular/Lymphatic: No evidence of abdominal aortic aneurysm. Mild atherosclerotic calcifications the bilateral common iliac arteries. Small para-aortic nodes, measuring up to 12 mm short axis on the right. Dominant 1.8 cm short axis right common iliac node (series 2/image 39), previously 4.0 cm. 11 mm short axis right external iliac node (series 2/image 100), previously 16 mm. Reproductive: Prostate is grossly unremarkable. Other: No abdominopelvic ascites. Musculoskeletal: Lumbar spine is unremarkable. IMPRESSION: Improving lymphadenopathy in the chest, abdomen, and pelvis. Dominant axillary nodes measure up to 10 mm, previously 2.2 cm. Dominant right common iliac node measures 1.8 cm, previously 4.0 cm. Prior left upper lobe/suprahilar nodular opacity has resolved. New patchy/nodular opacities in the right middle and lower lobes. While lymphomatous involvement is possible, the appearance favors infection/inflammation. Spleen is normal in size. Electronically Signed   By: Julian Hy M.D.   On: 08/28/2019 16:02   MR BRAIN WO CONTRAST  Result Date: 08/30/2019 CLINICAL DATA:  Stroke, follow-up, research study EXAM: MRI HEAD WITHOUT CONTRAST TECHNIQUE: Multiplanar, multiecho pulse sequences of the brain and surrounding structures were obtained without intravenous contrast. COMPARISON:  08/29/2019 FINDINGS: Brain: Reduced diffusion is again identified in the right paramedian pons. Extent of abnormal signal appears slightly  increased. There is no evidence of intracranial hemorrhage. Patchy T2 hyperintensity in the supratentorial white matter likely reflects stable chronic microvascular ischemic changes. Vascular: Major vessel flow voids at the skull base are preserved. Skull and upper cervical spine: Normal marrow signal is preserved. Sinuses/Orbits: Minor mucosal thickening. There is susceptibility artifact along the left orbit from fixation hardware. Other: Sella is unremarkable.  Mastoid air  cells are clear. IMPRESSION: Evolving recent right paramedian pontine infarction with slight increase in extent of abnormal signal. No hemorrhage. Electronically Signed   By: Macy Mis M.D.   On: 08/30/2019 20:34   MR BRAIN WO CONTRAST  Result Date: 08/29/2019 CLINICAL DATA:  Awoke this morning with gait disturbance and slurred speech. EXAM: MRI HEAD WITHOUT CONTRAST TECHNIQUE: Multiplanar, multiecho pulse sequences of the brain and surrounding structures were obtained without intravenous contrast. COMPARISON:  Head CT same day. FINDINGS: Brain: Acute infarction affecting the right para median pons. No other acute brain insult. The brain is otherwise normal without atrophy, old or acute infarction elsewhere, mass lesion, hemorrhage, hydrocephalus or extra-axial collection. Axial T2 imaging was omitted. Vascular: Major vessels at the base of the brain show flow. Skull and upper cervical spine: Negative Sinuses/Orbits: Clear Other: None IMPRESSION: Acute infarction of the right para median pons. Otherwise normal brain MRI. Electronically Signed   By: Nelson Chimes M.D.   On: 08/29/2019 21:11   CT Abdomen Pelvis W Contrast  Result Date: 08/28/2019 CLINICAL DATA:  Follow-up B-cell lymphoma after 2 cycles of induction chemo-immunotherapy EXAM: CT CHEST, ABDOMEN, AND PELVIS WITH CONTRAST TECHNIQUE: Multidetector CT imaging of the chest, abdomen and pelvis was performed following the standard protocol during bolus administration of  intravenous contrast. CONTRAST:  140mL OMNIPAQUE IOHEXOL 300 MG/ML  SOLN COMPARISON:  PET-CT dated 05/15/2019 FINDINGS: CT CHEST FINDINGS Cardiovascular: The heart is normal in size. No pericardial effusion. No evidence of thoracic aortic aneurysm. Mild atherosclerotic calcifications of the aortic arch. Mediastinum/Nodes: Calcified mediastinal and bilateral perihilar nodes. Dominant 10 mm short axis right paratracheal node, unchanged. Mildly prominent bilateral axillary nodes, measuring up to 10 mm short axis, previously 2.2 cm. Visualized thyroid is unremarkable. Lungs/Pleura: Prior central left upper lobe/suprahilar nodule has resolved. Calcifications with scarring in the left perihilar region. Patchy opacity in the medial right lower lobe, measuring 1.5 x 7.4 cm (series 4/image 104), new. Patchy/nodular opacity inferiorly in the right middle lobe measuring up to 13 mm (series 4/image 117), new. No pleural effusion or pneumothorax. Musculoskeletal: Degenerative changes of the thoracic spine. CT ABDOMEN PELVIS FINDINGS Hepatobiliary: Liver is within normal limits. Gallbladder is unremarkable. No intrahepatic or extrahepatic ductal dilatation. Pancreas: Within normal limits. Spleen: Spleen is normal in size. Adrenals/Urinary Tract: Adrenal glands are within normal limits. Small bilateral renal cysts, measuring up to 1.6 cm in the posterior right lower kidney. No hydronephrosis. Mildly thick-walled bladder, although underdistended. Stomach/Bowel: Stomach is notable for a tiny hiatal hernia. No evidence of bowel obstruction. Normal appendix (series 2/image 83). Vascular/Lymphatic: No evidence of abdominal aortic aneurysm. Mild atherosclerotic calcifications the bilateral common iliac arteries. Small para-aortic nodes, measuring up to 12 mm short axis on the right. Dominant 1.8 cm short axis right common iliac node (series 2/image 39), previously 4.0 cm. 11 mm short axis right external iliac node (series 2/image 100),  previously 16 mm. Reproductive: Prostate is grossly unremarkable. Other: No abdominopelvic ascites. Musculoskeletal: Lumbar spine is unremarkable. IMPRESSION: Improving lymphadenopathy in the chest, abdomen, and pelvis. Dominant axillary nodes measure up to 10 mm, previously 2.2 cm. Dominant right common iliac node measures 1.8 cm, previously 4.0 cm. Prior left upper lobe/suprahilar nodular opacity has resolved. New patchy/nodular opacities in the right middle and lower lobes. While lymphomatous involvement is possible, the appearance favors infection/inflammation. Spleen is normal in size. Electronically Signed   By: Julian Hy M.D.   On: 08/28/2019 16:02   DG Chest Port 1 View  Result Date:  08/29/2019 CLINICAL DATA:  Slurred speech and stroke-like symptoms EXAM: PORTABLE CHEST 1 VIEW COMPARISON:  CT from the previous day. FINDINGS: Cardiac shadow is stable. The lungs are well aerated bilaterally. Persistent right basilar density is noted medially similar to that seen on prior CT examination. No bony abnormality is noted. IMPRESSION: Stable right lower lobe infiltrate. Electronically Signed   By: Inez Catalina M.D.   On: 08/29/2019 22:31   ECHOCARDIOGRAM COMPLETE  Result Date: 08/30/2019   ECHOCARDIOGRAM REPORT   Patient Name:   NICKALIS WARRELL Lorenson Date of Exam: 08/30/2019 Medical Rec #:  [id number]     Height:       67.0 in Accession #:    [id number]    Weight:       150.0 lb Date of Birth:  1955-10-26     BSA:          1.79 m Patient Age:    50 years      BP:           141/71 mmHg Patient Gender: M             HR:           74 bpm. Exam Location:  Inpatient Procedure: 2D Echo Indications:    stroke 434.91  History:        Patient has no prior history of Echocardiogram examinations.                 Risk Factors:Diabetes and Hypertension.  Sonographer:    Johny Chess Referring Phys: [place]  1. Left ventricular ejection fraction, by visual estimation, is 60 to 65%. The left  ventricle has normal function. There is moderately increased left ventricular hypertrophy.  2. Global right ventricle has normal systolic function.The right ventricular size is normal.  3. Left atrial size was normal.  4. Right atrial size was normal.  5. The mitral valve is normal in structure. Trivial mitral valve regurgitation.  6. The tricuspid valve is normal in structure. Mild regurgitation.  7. The aortic valve is tricuspid. Aortic valve regurgitation is not visualized. No evidence of aortic valve sclerosis or stenosis.  8. The pulmonic valve was not well visualized. Pulmonic valve regurgitation is not visualized.  9. The inferior vena cava is normal in size with greater than 50% respiratory variability, suggesting right atrial pressure of 3 mmHg. 10. The tricuspid regurgitant velocity is 2.65 m/s, and with an assumed right atrial pressure of 3 mmHg, the estimated right ventricular systolic pressure is mildly elevated at 31.1 mmHg. FINDINGS  Left Ventricle: Left ventricular ejection fraction, by visual estimation, is 60 to 65%. The left ventricle has normal function. The left ventricle has no regional wall motion abnormalities. There is moderately increased left ventricular hypertrophy. Asymmetric left ventricular hypertrophy. Left ventricular diastolic parameters were normal. Right Ventricle: The right ventricular size is normal. No increase in right ventricular wall thickness. Global RV systolic function is has normal systolic function. The tricuspid regurgitant velocity is 2.65 m/s, and with an assumed right atrial pressure  of 3 mmHg, the estimated right ventricular systolic pressure is mildly elevated at 31.1 mmHg. Left Atrium: Left atrial size was normal in size. Right Atrium: Right atrial size was normal in size Pericardium: There is no evidence of pericardial effusion. Mitral Valve: The mitral valve is normal in structure. Trivial mitral valve regurgitation. Tricuspid Valve: The tricuspid valve is  normal in structure. Tricuspid valve regurgitation is mild. Aortic Valve: The aortic valve is  tricuspid. Aortic valve regurgitation is not visualized. The aortic valve is structurally normal, with no evidence of sclerosis or stenosis. Pulmonic Valve: The pulmonic valve was not well visualized. Pulmonic valve regurgitation is not visualized. Pulmonic regurgitation is not visualized. Aorta: The aortic root is normal in size and structure. Venous: The inferior vena cava is normal in size with greater than 50% respiratory variability, suggesting right atrial pressure of 3 mmHg. IAS/Shunts: The atrial septum is grossly normal.  LEFT VENTRICLE PLAX 2D LVIDd:         4.90 cm  Diastology LVIDs:         3.30 cm  LV e' lateral:   13.10 cm/s LV PW:         1.10 cm  LV E/e' lateral: 5.1 LV IVS:        0.90 cm  LV e' medial:    8.16 cm/s LVOT diam:     2.10 cm  LV E/e' medial:  8.2 LV SV:         69 ml LV SV Index:   38.18 LVOT Area:     3.46 cm  RIGHT VENTRICLE RV S prime:     13.10 cm/s TAPSE (M-mode): 2.6 cm LEFT ATRIUM           Index       RIGHT ATRIUM           Index LA diam:      3.50 cm 1.96 cm/m  RA Area:     13.80 cm LA Vol (A2C): 47.2 ml 26.38 ml/m RA Volume:   34.10 ml  19.06 ml/m LA Vol (A4C): 45.4 ml 25.37 ml/m  AORTIC VALVE LVOT Vmax:   132.00 cm/s LVOT Vmean:  81.200 cm/s LVOT VTI:    0.268 m  AORTA Ao Root diam: 3.20 cm MITRAL VALVE                        TRICUSPID VALVE MV Area (PHT): 2.87 cm             TR Peak grad:   28.1 mmHg MV PHT:        76.56 msec           TR Vmax:        265.00 cm/s MV Decel Time: 264 msec MV E velocity: 67.30 cm/s 103 cm/s  SHUNTS MV A velocity: 72.00 cm/s 70.3 cm/s Systemic VTI:  0.27 m MV E/A ratio:  0.93       1.5       Systemic Diam: 2.10 cm  Oswaldo Milian MD Electronically signed by Oswaldo Milian MD Signature Date/Time: 08/30/2019/4:58:42 PM    Final     Microbiology: Recent Results (from the past 240 hour(s))  SARS CORONAVIRUS 2 (TAT 6-24 HRS)  Nasopharyngeal Nasopharyngeal Swab     Status: None   Collection Time: 08/29/19  9:18 PM   Specimen: Nasopharyngeal Swab  Result Value Ref Range Status   SARS Coronavirus 2 NEGATIVE NEGATIVE Final    Comment: (NOTE) SARS-CoV-2 target nucleic acids are NOT DETECTED. The SARS-CoV-2 RNA is generally detectable in upper and lower respiratory specimens during the acute phase of infection. Negative results do not preclude SARS-CoV-2 infection, do not rule out co-infections with other pathogens, and should not be used as the sole basis for treatment or other patient management decisions. Negative results must be combined with clinical observations, patient history, and epidemiological information. The expected result is Negative. Fact Sheet for Patients:  SugarRoll.be Fact Sheet for Healthcare Providers: https://www.woods-mathews.com/ This test is not yet approved or cleared by the [place] FDA and  has been authorized for detection and/or diagnosis of SARS-CoV-2 by FDA under an Emergency Use Authorization (EUA). This EUA will remain  in effect (meaning this test can be used) for the duration of the COVID-19 declaration under Section 56 4(b)(1) of the Act, 21 U.S.C. section 360bbb-3(b)(1), unless the authorization is terminated or revoked sooner. Performed at [facility], [place] [address] [place], [place] [address]      Labs: Basic Metabolic Panel: Recent Labs  Lab 08/29/19 1931 08/29/19 2004 08/31/19 0402 09/01/19 0201 09/02/19 0116 09/03/19 0148  NA 128* 126* 129* 130* 129* 126*  K 4.1 5.4* 3.5 3.6 3.3* 3.6  CL 94* 95* 97* 98 100 97*  CO2 23  --  22 22 21* 20*  GLUCOSE 115* 108* 145* 109* 100* 104*  BUN 8 11 7* 7* 7* 6*  CREATININE 0.86 0.80 0.99 0.88 0.79 0.80  CALCIUM 9.7  --  8.8* 9.3 9.0 8.6*  MG  --   --   --   --  1.6*  --    Liver Function Tests: Recent Labs  Lab 08/29/19 1931  AST 18  ALT 8  ALKPHOS 61    BILITOT 1.3*  PROT 7.0  ALBUMIN 4.0   No results for input(s): LIPASE, AMYLASE in the last 168 hours. No results for input(s): AMMONIA in the last 168 hours. CBC: Recent Labs  Lab 08/29/19 1931 08/29/19 1947 08/29/19 2004 08/31/19 0402 09/01/19 0201 09/02/19 0116  WBC 4.3  --   --  3.7*  --   --   NEUTROABS 3.2  --   --   --   --   --   HGB 9.8* 9.9* 10.2* 9.3* 9.0* 8.7*  HCT 28.8* 29.0* 30.0* 27.3* 27.2* 25.5*  MCV 73.1*  --   --  72.2*  --   --   PLT 310  --   --  362  --   --    Cardiac Enzymes: No results for input(s): CKTOTAL, CKMB, CKMBINDEX, TROPONINI in the last 168 hours. BNP: BNP (last 3 results) No results for input(s): BNP in the last 8760 hours.  ProBNP (last 3 results) No results for input(s): PROBNP in the last 8760 hours.  CBG: Recent Labs  Lab 09/02/19 0753 09/02/19 1132 09/02/19 1606 09/02/19 2129 09/03/19 0634  GLUCAP 118* 121* 156* 128* 94       Signed:  Janaki Exley  [facility] 09/03/2019, 10:09 AM

## 2019-09-03 NOTE — H&P (Signed)
Physical Medicine and Rehabilitation Admission H&P    Chief Complaint  Patient presents with  . Weakness  : HPI: [name] [name] is a 64 year old right-handed male with history of diabetes mellitus (a1c is 5.9) , chronic hyponatremia, hypertension, remote tobacco use, CLL/small B-cell lymphoma followed by Dr. Irene Limbo with last chemotherapy October 2020.  Per chart review patient lives with a 52 year old mother.  Independent prior to admission.  1 level home with ramped entrance.  Mother has an aide that assists her for a couple of hours daily Patient reports being on disability due to [place] in early 2000s. .  Presented 08/29/2019 with left-sided weakness and mild dysarthria.  Admission labs with hemoglobin 9.8, sodium 128, urine drug screen negative, SARS coronavirus negative, hemoglobin 9.8, hematocrit 28.8, platelet 310,000, WBC 4.3.  Cranial CT scan unremarkable.  Patient did not receive TPA.  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  CT angiogram of head and neck with no emergent large vessel occlusion or high-grade stenosis.  CT of chest abdomen pelvis showed improving lymphadenopathy in the chest abdomen pelvis.  Echocardiogram with ejection fraction 65% without emboli.  Neurology follow-up currently maintained on Plavix for CVA prophylaxis.  Patient was placed in [place] bayer factor XI inhibitor stroke prevention trial.  Subcutaneous Lovenox for DVT prophylaxis.  Persistent hyponatremia 126-130 and initially maintained on normal saline.  Tolerating a regular consistency diet.  Therapy evaluations completed and patient was admitted for a comprehensive rehab program.  Michela Pitcher he needs his ears cleaned out-- "comes to the hospital every year to clean out his ears" and needs wax "gone".  Denies any other Sx's other than L sided weakness.  Currently on IVFs 100 cc/hr for hyponatremia. Doesn't wear CPAP at home for OSA. Complains that his L forehead feels "swollen" like skin is  pulled tight over swelling.  No specific area of head except was on L- kept pointing toward forehead, cheek and temple.    Review of Systems  Constitutional: Negative for chills and fever.  HENT: Negative for hearing loss.   Eyes: Negative for blurred vision and double vision.  Respiratory: Negative for cough and shortness of breath.   Cardiovascular: Positive for leg swelling. Negative for chest pain.  Gastrointestinal: Positive for constipation and heartburn. Negative for nausea and vomiting.       GERD  Genitourinary: Negative for dysuria and hematuria.  Musculoskeletal: Positive for joint pain and myalgias.  Skin: Negative for rash.  Neurological: Positive for speech change and weakness.  Psychiatric/Behavioral: The patient has insomnia.   All other systems reviewed and are negative.  Past Medical History:  Diagnosis Date  . Allergic rhinitis, cause unspecified   . Backache, unspecified   . Body mass index 33.0-33.9, adult   . Diabetes mellitus without complication ([place])    Type II  . Elevated blood pressure reading without diagnosis of hypertension   . Esophageal reflux   . Generalized pain   . Heartburn   . Insomnia, unspecified   . 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 Other abnormal blood chemistry   . Other and unspecified hyperlipidemia   . Other dyspnea and respiratory abnormality   . Other nonspecific findings on examination of blood(790.99)   . Pain in joint, lower leg   . Pneumonia    1968  . Sleep apnea    Does not use or own a CPAP  . Tobacco use disorder   . Unspecified essential hypertension  Past Surgical History:  Procedure Laterality Date  . bil wrist surgery    . ENDOSCOPIC CONCHA BULLOSA RESECTION Bilateral 05/18/2018   Procedure: ENDOSCOPIC CONCHA BULLOSA RESECTION;  Surgeon: Jerrell Belfast, MD;  Location: [facility];  Service: ENT;  Laterality: Bilateral;  . NASAL SEPTOPLASTY W/ TURBINOPLASTY  Bilateral 05/18/2018   Procedure: NASAL SEPTOPLASTY WITH TURBINATE REDUCTION;  Surgeon: Jerrell Belfast, MD;  Location: [facility];  Service: ENT;  Laterality: Bilateral;  . ORIF FOREARM FRACTURE     right  . plastic surgery to face    . SINUS ENDO W/FUSION Bilateral 05/18/2018   Procedure: ENDOSCOPIC SINUS SURGERY WITH NAVIGATION;  Surgeon: Jerrell Belfast, MD;  Location: [facility] OR;  Service: ENT;  Laterality: Bilateral;   Family History  Problem Relation Age of Onset  . Diabetes Mother   . Hypertension Mother   . Cancer Paternal Uncle    Social History:  reports that he quit smoking about 7 years ago. His smoking use included cigars. He has a 4.00 pack-year smoking history. He has never used smokeless tobacco. He reports current alcohol use. He reports that he does not use drugs. Allergies:  Allergies  Allergen Reactions  . Lisinopril Itching  . Tuberculin Tests Swelling   Medications Prior to Admission  Medication Sig Dispense Refill  . amLODipine (NORVASC) 10 MG tablet Take 10 mg by mouth daily.    [name] atorvastatin (LIPITOR) 20 MG tablet Take 20 mg by mouth daily.    . cyclobenzaprine (FLEXERIL) 10 MG tablet Take 10 mg by mouth at bedtime.    [name] losartan (COZAAR) 50 MG tablet Take 50 mg by mouth daily.    [name] oxyCODONE-acetaminophen (PERCOCET/ROXICET) 5-325 MG tablet Take 1 tablet by mouth 3 (three) times daily as needed for pain.  0  . pantoprazole (PROTONIX) 40 MG tablet Take 40 mg by mouth at bedtime.     . potassium chloride SA (K-DUR,KLOR-CON) 20 MEQ tablet Take 20 mEq by mouth daily.    [name] zolpidem (AMBIEN) 10 MG tablet Take 10 mg by mouth at bedtime.     [name] acyclovir (ZOVIRAX) 400 MG tablet Take 1 tablet (400 mg total) by mouth daily. (Patient not taking: Reported on 08/29/2019) 30 tablet 3  . allopurinol (ZYLOPRIM) 300 MG tablet Take 0.5 tablets (150 mg total) by mouth daily. (Patient not taking: Reported on 08/29/2019) 30 tablet 3  . dexamethasone (DECADRON) 4 MG tablet Take 2 tablets (8 mg  total) by mouth daily. Start the day after bendamustine chemotherapy for 2 days. Take with food. (Patient not taking: Reported on 08/29/2019) 30 tablet 1  . ondansetron (ZOFRAN) 8 MG tablet Take 1 tablet (8 mg total) by mouth 2 (two) times daily as needed for refractory nausea / vomiting. Start on day 2 after bendamustine chemotherapy. (Patient not taking: Reported on 08/29/2019) 30 tablet 1  . prochlorperazine (COMPAZINE) 10 MG tablet Take 1 tablet (10 mg total) by mouth every 6 (six) hours as needed (Nausea or vomiting). (Patient not taking: Reported on 08/29/2019) 30 tablet 1    Drug Regimen Review Drug regimen was reviewed and remains appropriate with no significant issues identified  Home: Home Living Family/patient expects to be discharged to:: Private residence Living Arrangements: Parent Available Help at Discharge: Family Type of Home: House Home Access: Ramped entrance [place]: One level Bathroom Shower/Tub: Multimedia programmer: [place]: None  Lives With: Family  mother lives with him- has ramp; 1 story house;  Functional History: Prior Function Level of Independence:  Independent Comments: pt reports being retired from working for [employer], assists his 22 y/o mother as needed. Pt's mother has an aide that assists her in the am for a couple of hours.  Functional Status:  Mobility: Bed Mobility Overal bed mobility: Needs Assistance Bed Mobility: Supine to Sit Supine to sit: HOB elevated, Min assist Sit to supine: Max assist, HOB elevated General bed mobility comments: +rail, cues for sequencing, assist to elevate trunk Transfers Overall transfer level: Needs assistance Equipment used: 2 person hand held assist Transfers: Sit to/from Stand, Stand Pivot Transfers Sit to Stand: Min assist, +2 physical assistance Stand pivot transfers: Mod assist, +2 physical assistance General transfer comment: +2 minA to powerup into standing;modA+2  for pivot toward recliner on right, required L knee blocking Ambulation/Gait General Gait Details: pre-gait activities in stance with BUE support, lateral weight shifts, +2 min/mod assist.    ADL: ADL Overall ADL's : Needs assistance/impaired Grooming: Minimal assistance, Sitting Upper Body Bathing: Moderate assistance, Sitting Lower Body Bathing: Maximal assistance, +2 for physical assistance, +2 for safety/equipment, Sit to/from stand Upper Body Dressing : Moderate assistance, Sitting Lower Body Dressing: Moderate assistance, Maximal assistance, Sit to/from stand, +2 for physical assistance Toilet Transfer: Moderate assistance, Ambulation, +2 for physical assistance, +2 for safety/equipment Toilet Transfer Details (indicate cue type and reason): took 4 side steps towards right to pivot to the recliner, pt required modA+2  Functional mobility during ADLs: Moderate assistance, +2 for physical assistance, Minimal assistance General ADL Comments: limited by L hemiparesis, impaired balance, decreased activity tolerance  Cognition: Cognition Overall Cognitive Status: Impaired/Different from baseline Arousal/Alertness: Awake/alert Orientation Level: Oriented X4 Attention: Selective Selective Attention: Appears intact Memory: Impaired Memory Impairment: Other (comment)(working memory) Awareness: Appears intact Problem Solving: Impaired Problem Solving Impairment: Verbal complex Cognition Arousal/Alertness: Awake/alert Behavior During Therapy: WFL for tasks assessed/performed Overall Cognitive Status: Impaired/Different from baseline Area of Impairment: Problem solving, Safety/judgement, Following commands Following Commands: Follows multi-step commands with increased time Safety/Judgement: Decreased awareness of safety Problem Solving: Slow processing, Difficulty sequencing, Requires verbal cues General Comments: pt with slow processing, requiring increased time to respond  Physical  Exam: Blood pressure 128/71, pulse (!) 59, temperature 98.3 F (36.8 C), temperature source Axillary, resp. rate 18, height 5\' 7"  (1.702 m), weight 68 kg, SpO2 98 %. Physical Exam  Nursing note and vitals reviewed. Constitutional: He is oriented to person, place, and time. He appears well-developed and well-nourished.  Just spilled urinal trying to void on underpad, frustrated, sitting up in bedside chair, nursing trying to get him cleaned up, NAD  HENT:  Head: Normocephalic and atraumatic.  Mouth/Throat: No oropharyngeal exudate.  Didn't want to smile, but didn't appear to have any facial asymmetry at rest; nor would he stick his tongue out to determine if asymmetrical; said facial sensation intact  Eyes: Pupils are equal, round, and reactive to light. No scleral icterus.  EOM grossly intact; didn't see nystagmus; refused to do EOM formal testing  Neck: No tracheal deviation present.  Cardiovascular:  RRR  Respiratory: Breath sounds normal.  CTA B/L  GI: Soft.  Soft, NT, ND, (+)BS hypoactive  Musculoskeletal:     Cervical back: Normal range of motion and neck supple.     Comments: RUE 5/5 in deltoid, biceps, WE, triceps, grip and finger abd LUE 0/5- refused to try and move LUE- said "didn't move"  Shoulder shrug intact on L RLE_ 5/5 in HF, KE,KF, DF,PF and EHL  LLE- HF 1/5, KE 1/5, KF 0/5, DF 0/5,  PF 1/5, EHL 0/5  Pain with ROM of L knee- hamstring is tight as well  Neurological: He is alert and oriented to person, place, and time.  Patient is alert in no acute distress.  Makes good eye contact with examiner.  Provides his name age and date of birth.  Follows simple commands.  Fair awareness of his deficits. Cannot move LUE, so cannot test coordination - refused to check R side- "it's fine".  (+) hoffman's LUE; equivocal Clonus on LLE- 2 beats Sensation to light touch  Intact in all 4 extremities AND face intact  Skin: Skin is warm and dry.  Peripheral IV in L forearm- no signs  of infiltrate- running IVFs  Psychiatric:  Irritable; otherwise flattened affect    Results for orders placed or performed during the hospital encounter of 08/29/19 (from the past 48 hour(s))  Glucose, capillary     Status: Abnormal   Collection Time: 09/01/19 11:50 AM  Result Value Ref Range   Glucose-Capillary 131 (H) 70 - 99 mg/dL  Glucose, capillary     Status: Abnormal   Collection Time: 09/01/19  4:22 PM  Result Value Ref Range   Glucose-Capillary 106 (H) 70 - 99 mg/dL  Glucose, capillary     Status: Abnormal   Collection Time: 09/01/19  9:16 PM  Result Value Ref Range   Glucose-Capillary 112 (H) 70 - 99 mg/dL  Basic metabolic panel     Status: Abnormal   Collection Time: 09/02/19  1:16 AM  Result Value Ref Range   Sodium 129 (L) 135 - 145 mmol/L   Potassium 3.3 (L) 3.5 - 5.1 mmol/L   Chloride 100 98 - 111 mmol/L   CO2 21 (L) 22 - 32 mmol/L   Glucose, Bld 100 (H) 70 - 99 mg/dL   BUN 7 (L) 8 - 23 mg/dL   Creatinine, Ser 0.79 0.61 - 1.24 mg/dL   Calcium 9.0 8.9 - 10.3 mg/dL   GFR calc non Af Amer >60 >60 mL/min   GFR calc Af Amer >60 >60 mL/min   Anion gap 8 5 - 15    Comment: Performed at [facility], [address] [place], [place] [address]  AM Hemoglobin and hematocrit, blood     Status: Abnormal   Collection Time: 09/02/19  1:16 AM  Result Value Ref Range   Hemoglobin 8.7 (L) 13.0 - 17.0 g/dL   HCT 25.5 (L) 39.0 - 52.0 %    Comment: Performed at [facility], [place] [address] [place],  [address]  Magnesium     Status: Abnormal   Collection Time: 09/02/19  1:16 AM  Result Value Ref Range   Magnesium 1.6 (L) 1.7 - 2.4 mg/dL    Comment: Performed at [facility] [address] [place], [place] [address]  Glucose, capillary     Status: Abnormal   Collection Time: 09/02/19  6:02 AM  Result Value Ref Range   Glucose-Capillary 151 (H) 70 - 99 mg/dL  Glucose, capillary     Status: Abnormal   Collection Time: 09/02/19  7:53 AM  Result  Value Ref Range   Glucose-Capillary 118 (H) 70 - 99 mg/dL   Comment 1 Notify RN    Comment 2 Document in Chart   Glucose, capillary     Status: Abnormal   Collection Time: 09/02/19 11:32 AM  Result Value Ref Range   Glucose-Capillary 121 (H) 70 - 99 mg/dL  Glucose, capillary     Status:  Abnormal   Collection Time: 09/02/19  4:06 PM  Result Value Ref Range   Glucose-Capillary 156 (H) 70 - 99 mg/dL   Comment 1 Notify RN    Comment 2 Document in Chart   Glucose, capillary     Status: Abnormal   Collection Time: 09/02/19  9:29 PM  Result Value Ref Range   Glucose-Capillary 128 (H) 70 - 99 mg/dL   Comment 1 Notify RN    Comment 2 Document in Chart   Basic metabolic panel     Status: Abnormal   Collection Time: 09/03/19  1:48 AM  Result Value Ref Range   Sodium 126 (L) 135 - 145 mmol/L   Potassium 3.6 3.5 - 5.1 mmol/L   Chloride 97 (L) 98 - 111 mmol/L   CO2 20 (L) 22 - 32 mmol/L   Glucose, Bld 104 (H) 70 - 99 mg/dL   BUN 6 (L) 8 - 23 mg/dL   Creatinine, Ser 0.80 0.61 - 1.24 mg/dL   Calcium 8.6 (L) 8.9 - 10.3 mg/dL   GFR calc non Af Amer >60 >60 mL/min   GFR calc Af Amer >60 >60 mL/min   Anion gap 9 5 - 15    Comment: Performed at [facility] [address], [place], [place] [address]  Glucose, capillary     Status: None   Collection Time: 09/03/19  6:34 AM  Result Value Ref Range   Glucose-Capillary 94 70 - 99 mg/dL   No results found.     Medical Problem List and Plan: 1.  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  Antithrombotics: -DVT/anticoagulation: Lovenox  -antiplatelet therapy: Plavix 75 mg daily patient placed in [employer] factor XI inhibitor stroke prevention trial 3. Pain Management: Tylenol as needed 4. Mood: Provide emotional support  -antipsychotic agents: N/A 5. Neuropsych: This patient is capable of  making decisions on his own behalf. 6. Skin/Wound Care: Routine skin checks 7. Fluids/Electrolytes/Nutrition: Routine in and outs with follow-up chemistries 8.  Chronic hyponatremia.  Review of chart shows patient did have a low sodium in September through November 2020.  Follow-up chemistries- is currently on IVFs 100cc/hr 9.  CLL/small B-cell lymphoma.  Follow-up Dr.Kale and this was discussed with oncology.  Patient last received chemotherapy October 2020 10.  Type 2 diabetes mellitus.  Hemoglobin A1c 5.9.  Glucophage currently held.  Continue sliding scale-  11.  Hyperlipidemia.  Lipitor 12.  Essential hypertension.  Monitor with increased mobility 13. Ear fullness- said needs his ears "cleaned out"  Which is needed before d/c per pt. 14. Mild Spasticity- Hoffman's was (+)- monitor 15. OSA_ refuses to wear CPAP 16. Hypokalemia- last check was 136- will check F/U chemistries.  Lavon Paganini Angiulli, PA-C 09/03/2019   I have personally performed a face to face diagnostic evaluation of this patient and formulated the key components of the plan.  Additionally, I have personally reviewed laboratory data, imaging studies, as well as relevant notes and concur with the physician assistant's documentation above.   The patient's status has not changed from the original H&P.  Any changes in documentation from the acute care chart have been noted above.

## 2019-09-03 NOTE — TOC Transition Note (Signed)
Transition of Care [facility]) - CM/SW Discharge Note   Patient Details  Name: [name] [name] MRN: [id number] Date of Birth: 1956/03/15  [facility] CM/SW Contact:  Pollie Friar, RN Phone Number: 09/03/2019, 10:02 AM   Clinical Narrative:    Patient is discharging to CIR today. CM signing off.    Final next level of care: IP Rehab Facility Barriers to Discharge: No Barriers Identified   Patient Goals and CMS Choice        Discharge Placement                       Discharge Plan and Services                                     Social Determinants of Health (SDOH) Interventions     Readmission Risk Interventions No flowsheet data found.

## 2019-09-03 NOTE — Progress Notes (Signed)
Inpatient Rehabilitation Admissions Coordinator  I have insurance approval and bed available to admit pt to inpt rehab today. I met with patient at bedside and he is in agreement. I have notified Dr. Ree Kida, RN [place] , Vida Roller and SW, Kathlee Nations. I will make the arrangements to admit today.  Danne Baxter, RN, MSN Rehab Admissions Coordinator [phone] 09/03/2019 10:07 AM

## 2019-09-03 NOTE — Progress Notes (Signed)
Physical Therapy Treatment Patient Details Name: [name] [name] MRN: [id number] DOB: 1956-06-12 Today's Date: 09/03/2019    History of Present Illness Pt is a 64 y/o male admitted to ED on 1/8 with staggering gait, L hemiparesis, dysarthria, and fall due to weakness. 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 Pt with CLL with recent CT chest, abdomen, and pelvis revealing improving lymphadenopathy. Other notable PMH includes DM, sleep apnea, nasal septoplasty 04/2018.    PT Comments    Pt working very hard at mobility. Worked on increasing independence with bed mobility, min A for elevation of trunk into sitting. Practiced multiple sit<>stand and bed to chair transfer with min/ mod A +2. Pt performed pregait activities in standing with RUE support. Expect good progress in intense rehab environment. PT will continue to follow.    Follow Up Recommendations  CIR     Equipment Recommendations  Other (comment)(defer to next venue)    Recommendations for Other Services       Precautions / Restrictions Precautions Precautions: Fall Precaution Comments: L hemiparesis Restrictions Weight Bearing Restrictions: No    Mobility  Bed Mobility Overal bed mobility: Needs Assistance Bed Mobility: Supine to Sit     Supine to sit: HOB elevated;Min assist     General bed mobility comments: pt able to go to L with use of RUE on rail and cues for mvmt of LLE. Min A for trunk elevation  Transfers Overall transfer level: Needs assistance Equipment used: 2 person hand held assist Transfers: Sit to/from [facility] Sit to Stand: Min assist;+2 physical assistance Stand pivot transfers: Mod assist;+2 physical assistance       General transfer comment: L knee blocked but though unstable, did not fully buckle. Pivoted to chair, then practiced multiple sit<>stand from recliner with pt needing less assist each time  Ambulation/Gait              General Gait Details: pre-gait activities in stance with RUE support, lateral weight shifts, +2 min/mod assist.   Stairs             Wheelchair Mobility    Modified Rankin (Stroke Patients Only) Modified Rankin (Stroke Patients Only) Pre-Morbid Rankin Score: No symptoms Modified Rankin: Moderately severe disability     Balance Overall balance assessment: Needs assistance Sitting-balance support: Feet supported;No upper extremity supported Sitting balance-Leahy Scale: Fair Sitting balance - Comments: static sitting balance, preference to R UE support at EOB    Standing balance support: Single extremity supported;During functional activity Standing balance-Leahy Scale: Poor Standing balance comment: reliant on external support                            Cognition Arousal/Alertness: Awake/alert Behavior During Therapy: WFL for tasks assessed/performed Overall Cognitive Status: Impaired/Different from baseline Area of Impairment: Problem solving;Safety/judgement;Following commands                       Following Commands: Follows multi-step commands with increased time Safety/Judgement: Decreased awareness of safety   Problem Solving: Slow processing;Difficulty sequencing;Requires verbal cues General Comments: L neglect but improves with cueing. Slow processing.       Exercises General Exercises - Lower Extremity Ankle Circles/Pumps: AROM;Both;10 reps;Seated Quad Sets: AROM;Both;10 reps;Seated    General Comments General comments (skin integrity, edema, etc.): had pt practice giving instructions to others re: how to help him (eg pillow under LUE after session) as well  as reviewing safety precautions      Pertinent Vitals/Pain Pain Assessment: Faces Faces Pain Scale: Hurts a little bit Pain Location: L hand in certain positions Pain Descriptors / Indicators: Grimacing;Guarding Pain Intervention(s): Repositioned;Monitored during session     Home Living   Living Arrangements: (Pt's Mom lives with patietn as well as his 29 year old son, ) Available Help at Discharge: Family(Craig works 6 pm until 6 am)                Prior Function            PT Goals (current goals can now be found in the care plan section) Acute Rehab PT Goals Patient Stated Goal: return to independence PT Goal Formulation: With patient Time For Goal Achievement: 09/13/19 Potential to Achieve Goals: Good Progress towards PT goals: Progressing toward goals    Frequency    Min 4X/week      PT Plan Current plan remains appropriate    Co-evaluation              AM-PAC PT "6 Clicks" Mobility   Outcome Measure  Help needed turning from your back to your side while in a flat bed without using bedrails?: A Little Help needed moving from lying on your back to sitting on the side of a flat bed without using bedrails?: A Little Help needed moving to and from a bed to a chair (including a wheelchair)?: A Lot Help needed standing up from a chair using your arms (e.g., wheelchair or bedside chair)?: A Lot Help needed to walk in hospital room?: A Lot Help needed climbing 3-5 steps with a railing? : Total 6 Click Score: 13    End of Session Equipment Utilized During Treatment: Gait belt Activity Tolerance: Patient tolerated treatment well Patient left: in chair;with call bell/phone within reach Nurse Communication: Mobility status PT Visit Diagnosis: Hemiplegia and hemiparesis;Muscle weakness (generalized) (M62.81) Hemiplegia - Right/Left: Left Hemiplegia - dominant/non-dominant: Non-dominant Hemiplegia - caused by: Cerebral infarction     Time: [id number] PT Time Calculation (min) (ACUTE ONLY): 24 min  Charges:  $Gait Training: 8-22 mins $Therapeutic Activity: 8-22 mins                     Leighton Roach, [place]  Pager [phone] Office [place] 09/03/2019, 1:17 PM

## 2019-09-04 ENCOUNTER — Inpatient Hospital Stay (HOSPITAL_COMMUNITY): Payer: Medicare Other | Admitting: Speech Pathology

## 2019-09-04 ENCOUNTER — Inpatient Hospital Stay (HOSPITAL_COMMUNITY): Payer: Medicare Other

## 2019-09-04 DIAGNOSIS — E1165 Type 2 diabetes mellitus with hyperglycemia: Secondary | ICD-10-CM

## 2019-09-04 DIAGNOSIS — C9110 Chronic lymphocytic leukemia of B-cell type not having achieved remission: Secondary | ICD-10-CM

## 2019-09-04 DIAGNOSIS — I6350 Cerebral infarction due to unspecified occlusion or stenosis of unspecified cerebral artery: Secondary | ICD-10-CM

## 2019-09-04 DIAGNOSIS — D649 Anemia, unspecified: Secondary | ICD-10-CM

## 2019-09-04 DIAGNOSIS — E871 Hypo-osmolality and hyponatremia: Secondary | ICD-10-CM

## 2019-09-04 DIAGNOSIS — E8809 Other disorders of plasma-protein metabolism, not elsewhere classified: Secondary | ICD-10-CM

## 2019-09-04 DIAGNOSIS — E46 Unspecified protein-calorie malnutrition: Secondary | ICD-10-CM

## 2019-09-04 DIAGNOSIS — D72810 Lymphocytopenia: Secondary | ICD-10-CM

## 2019-09-04 DIAGNOSIS — I10 Essential (primary) hypertension: Secondary | ICD-10-CM

## 2019-09-04 LAB — GLUCOSE, CAPILLARY
Glucose-Capillary: 118 mg/dL — ABNORMAL HIGH (ref 70–99)
Glucose-Capillary: 99 mg/dL (ref 70–99)
Glucose-Capillary: 80 mg/dL (ref 70–99)
Glucose-Capillary: 121 mg/dL — ABNORMAL HIGH (ref 70–99)

## 2019-09-04 LAB — COMPREHENSIVE METABOLIC PANEL
ALT: 8 U/L (ref 0–44)
AST: 16 U/L (ref 15–41)
Albumin: 3.1 g/dL — ABNORMAL LOW (ref 3.5–5.0)
Alkaline Phosphatase: 49 U/L (ref 38–126)
Anion gap: 11 (ref 5–15)
BUN: 7 mg/dL — ABNORMAL LOW (ref 8–23)
CO2: 21 mmol/L — ABNORMAL LOW (ref 22–32)
Calcium: 9.2 mg/dL (ref 8.9–10.3)
Chloride: 96 mmol/L — ABNORMAL LOW (ref 98–111)
Creatinine, Ser: 0.97 mg/dL (ref 0.61–1.24)
GFR calc Af Amer: >60 mL/min (ref >60)
GFR calc non Af Amer: >60 mL/min (ref >60)
Glucose, Bld: 109 mg/dL — ABNORMAL HIGH (ref 70–99)
Potassium: 3.7 mmol/L (ref 3.5–5.1)
Sodium: 128 mmol/L — ABNORMAL LOW (ref 135–145)
Total Bilirubin: 0.7 mg/dL (ref 0.3–1.2)
Total Protein: 5.9 g/dL — ABNORMAL LOW (ref 6.5–8.1)

## 2019-09-04 LAB — CBC WITH DIFFERENTIAL/PLATELET
Abs Immature Granulocytes: 0.1 10*3/uL — ABNORMAL HIGH (ref 0.00–0.07)
Band Neutrophils: 1 %
Basophils Absolute: 0 10*3/uL (ref 0.0–0.1)
Basophils Relative: 1 %
Eosinophils Absolute: 0.2 10*3/uL (ref 0.0–0.5)
Eosinophils Relative: 6 %
HCT: 25.2 % — ABNORMAL LOW (ref 39.0–52.0)
Hemoglobin: 8.6 g/dL — ABNORMAL LOW (ref 13.0–17.0)
Lymphocytes Relative: 8 %
Lymphs Abs: 0.3 10*3/uL — ABNORMAL LOW (ref 0.7–4.0)
MCH: 24.4 pg — ABNORMAL LOW (ref 26.0–34.0)
MCHC: 34.1 g/dL (ref 30.0–36.0)
MCV: 71.6 fL — ABNORMAL LOW (ref 80.0–100.0)
Metamyelocytes Relative: 2 %
Monocytes Absolute: 0.4 10*3/uL (ref 0.1–1.0)
Monocytes Relative: 10 %
Neutro Abs: 2.6 10*3/uL (ref 1.7–7.7)
Neutrophils Relative %: 72 %
Platelets: 330 10*3/uL (ref 150–400)
RBC: 3.52 MIL/uL — ABNORMAL LOW (ref 4.22–5.81)
RDW: 17.2 % — ABNORMAL HIGH (ref 11.5–15.5)
WBC: 3.5 10*3/uL — ABNORMAL LOW (ref 4.0–10.5)
nRBC: 0 % (ref 0.0–0.2)
nRBC: 0 /100 WBC

## 2019-09-04 MED ORDER — DICLOFENAC SODIUM 1 % EX GEL
2.0000 g | Freq: Four times a day (QID) | CUTANEOUS | Status: DC
  Administered 2019-09-04 – 2019-09-07 (×11): 2 g via TOPICAL
  Filled 2019-09-04: qty 100

## 2019-09-04 MED ORDER — PRO-STAT SUGAR FREE PO LIQD
30.0000 mL | Freq: Two times a day (BID) | ORAL | Status: DC
  Administered 2019-09-04 – 2019-09-28 (×45): 30 mL via ORAL
  Filled 2019-09-04 (×46): qty 30

## 2019-09-04 NOTE — Evaluation (Signed)
Occupational Therapy Assessment and Plan  Patient Details  Name: [name] [name] MRN: [id number] Date of Birth: [birth year]  OT Diagnosis: abnormal posture, acute pain, cognitive deficits, disturbance of vision, hemiplegia affecting non-dominant side, muscle weakness (generalized) and swelling of limb Rehab Potential: Rehab Potential (ACUTE ONLY): Good ELOS: 2-2.5 weeks   Today's Date: 09/04/2019 OT Individual Time: 1100-1200 OT Individual Time Calculation (min): 60 min     Problem List:  Patient Active Problem List   Diagnosis Date Noted  . Acute on chronic anemia   . Lymphopenia   . Hypoalbuminemia due to protein-calorie malnutrition ([place])   . Controlled type 2 diabetes mellitus with hyperglycemia, without long-term current use of insulin ([place])   . Right pontine CVA ([place]) 09/03/2019  . Acute ischemic stroke ([place]) 08/29/2019  . Hyponatremia 08/29/2019  . HTN (hypertension) 08/29/2019  . DM2 (diabetes mellitus, type 2) ([place]) 08/29/2019  . CLL (chronic lymphocytic leukemia) ([place]) 04/10/2019  . Counseling regarding advance care planning and goals of care 04/10/2019  . Deviated septum 05/18/2018  . Iron deficiency anemia 02/26/2018    Past Medical History:  Past Medical History:  Diagnosis Date  . Allergic rhinitis, cause unspecified   . Backache, unspecified   . Body mass index 33.0-33.9, adult   . Diabetes mellitus without complication ([place])    Type II  . Elevated blood pressure reading without diagnosis of hypertension   . Esophageal reflux   . Generalized pain   . Heartburn   . Insomnia, unspecified   . 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 Other abnormal blood chemistry   . Other and unspecified hyperlipidemia   . Other dyspnea and respiratory abnormality   . Other nonspecific findings on examination of blood(790.99)   . Pain in joint, lower leg   . Pneumonia    1968  . Sleep apnea    Does not use or own a CPAP  .  Tobacco use disorder   . Unspecified essential hypertension    Past Surgical History:  Past Surgical History:  Procedure Laterality Date  . bil wrist surgery    . ENDOSCOPIC CONCHA BULLOSA RESECTION Bilateral 05/18/2018   Procedure: ENDOSCOPIC CONCHA BULLOSA RESECTION;  Surgeon: Jerrell Belfast, MD;  Location: [facility];  Service: ENT;  Laterality: Bilateral;  . NASAL SEPTOPLASTY W/ TURBINOPLASTY Bilateral 05/18/2018   Procedure: NASAL SEPTOPLASTY WITH TURBINATE REDUCTION;  Surgeon: Jerrell Belfast, MD;  Location: [facility];  Service: ENT;  Laterality: Bilateral;  . ORIF FOREARM FRACTURE     right  . plastic surgery to face    . SINUS ENDO W/FUSION Bilateral 05/18/2018   Procedure: ENDOSCOPIC SINUS SURGERY WITH NAVIGATION;  Surgeon: Jerrell Belfast, MD;  Location: [facility];  Service: ENT;  Laterality: Bilateral;    Assessment & Plan Clinical Impression: Pt is a 64 y/o male admitted to ED on 1/8 with staggering gait, L hemiparesis, dysarthria, and fall due to weakness. 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 Pt with CLL with recent CT chest, abdomen, and pelvis revealing improving lymphadenopathy. Other notable PMH includes DM, sleep apnea, nasal septoplasty 04/2018 .    Patient currently requires mod with basic self-care skills secondary to muscle weakness, decreased cardiorespiratoy endurance, unbalanced muscle activation, motor apraxia, decreased coordination and decreased motor planning, decreased visual perceptual skills and decreased visual motor skills, decreased attention to left, decreased problem solving, decreased safety awareness and decreased memory and decreased sitting balance, decreased standing balance, decreased postural  control, hemiplegia and decreased balance strategies.  Prior to hospitalization, patient could complete BADL/IADL with independent .  Patient will benefit from skilled intervention to decrease level of assist with basic self-care  skills and increase independence with basic self-care skills prior to discharge home with care partner.  Anticipate patient will require 24 hour supervision and follow up home health.  OT - End of Session Activity Tolerance: Tolerates 30+ min activity with multiple rests Endurance Deficit: Yes OT Assessment Rehab Potential (ACUTE ONLY): Good OT Barriers to Discharge: Inaccessible home environment;Decreased caregiver support;Lack of/limited family support OT Patient demonstrates impairments in the following area(s): Balance;Cognition;Edema;Endurance;Motor;Pain;Perception;Safety;Vision OT Basic ADL's Functional Problem(s): Grooming;Bathing;Dressing;Toileting;Eating OT Transfers Functional Problem(s): Toilet;Tub/Shower OT Additional Impairment(s): Fuctional Use of Upper Extremity OT Plan OT Intensity: Minimum of 1-2 x/day, 45 to 90 minutes OT Frequency: 5 out of 7 days OT Duration/Estimated Length of Stay: 2-2.5 weeks OT Treatment/Interventions: Balance/vestibular training;Discharge planning;Functional electrical stimulation;Pain management;Self Care/advanced ADL retraining;Therapeutic Activities;UE/LE Coordination activities;Cognitive remediation/compensation;Disease mangement/prevention;Functional mobility training;Patient/family education;Skin care/wound managment;Therapeutic Exercise;Visual/perceptual remediation/compensation;Community reintegration;DME/adaptive equipment instruction;Neuromuscular re-education;Psychosocial support;Splinting/orthotics;UE/LE Strength taining/ROM;Wheelchair propulsion/positioning OT Self Feeding Anticipated Outcome(s): S OT Basic Self-Care Anticipated Outcome(s): S OT Toileting Anticipated Outcome(s): S OT Bathroom Transfers Anticipated Outcome(s): S OT Recommendation Recommendations for Other Services: Speech consult Patient destination: Home Follow Up Recommendations: 24 hour supervision/assistance;Home health OT Equipment Recommended: 3 in 1 bedside  comode;Tub/shower seat Equipment Details: pt reports having shower seat, however need to confirm with son   Skilled Therapeutic Intervention 1;1. Pt received in w/c, with no pain reported and agreeable to shower. Pt educated on role/purose of OT, CIR, ELOS, POC, precatuions and NMR after stroke. Pt requires MOD A to transfer to L into shower with S to facilitate L foot into WB position during pivot with grab bar and VC for weight shift forward. Pt completes bathing with A to wash back, buttocks, L foot and RUE with HOH A of LUE for NMR. Pt requires MOD A to don shirt and MAX A for pants. Pt is dependent for footwear. Grooming seated at sink with MIN A, placing LUE into stabilizer position for toothpaste application. Exited session with pt seated in w/c exit alarm on and call light in reach  OT Evaluation Precautions/Restrictions  Precautions Precautions: Fall Precaution Comments: L hemiparesis Restrictions Weight Bearing Restrictions: No General Chart Reviewed: Yes Family/Caregiver Present: No Vital Signs   Pain Pain Assessment Pain Score: 0-No pain Home Living/Prior Functioning Home Living Family/patient expects to be discharged to:: Private residence Living Arrangements: Parent, Children Available Help at Discharge: Family Type of Home: House Home Access: Ramped entrance Home Layout: One level Bathroom Shower/Tub: Walk-in shower(reports having shower seat) Biochemist, clinical: Programmer, systems: Yes  Lives With: Family IADL History Homemaking Responsibilities: Yes Meal Prep Responsibility: Primary Laundry Responsibility: Primary Cleaning Responsibility: Primary Bill Paying/Finance Responsibility: Primary Shopping Responsibility: Primary Child Care Responsibility: (provides assistance to care for mother; mother has HHA in mornings) Current License: Yes Prior Function Level of Independence: Independent with basic ADLs, Independent with homemaking with  ambulation Vocation: On disability Comments: pt reports being retired from working for [employer], assists his 62 y/o mother as needed. Pt's mother has an aide that assists her in the am for a couple of hours. ADL   Vision Baseline Vision/History: Wears glasses Wears Glasses: Reading only Patient Visual Report: Other (comment)("split") Vision Assessment?: Yes Eye Alignment: Within Functional Limits Ocular Range of Motion: Within Functional Limits Alignment/Gaze Preference: Within Defined Limits Tracking/Visual Pursuits: Decreased smoothness of horizontal tracking Saccades: Undershoots Visual  Fields: No apparent deficits Perception  Perception: Impaired Inattention/Neglect: Does not attend to left side of body Praxis Praxis: Impaired Praxis Impairment Details: Initiation Cognition Overall Cognitive Status: Impaired/Different from baseline Arousal/Alertness: Lethargic Year: 2021 Month: January Day of Week: Correct Memory: Impaired Memory Impairment: Other (comment) Immediate Memory Recall: Sock;Blue;Bed Memory Recall Sock: Not able to recall(able to choose in field of 2) Memory Recall Blue: Without Cue Memory Recall Bed: Not able to recall(able to choose in field of 2) Sensation Sensation Light Touch: (P) Appears Intact Coordination Gross Motor Movements are Fluid and Coordinated: (P) No Fine Motor Movements are Fluid and Coordinated: (P) No Motor  Motor Motor: Hemiplegia Motor - Skilled Clinical Observations: Dense L hemi UE>LE Mobility  Transfers Sit to Stand: Moderate Assistance - Patient 50-74% Stand to Sit: Moderate Assistance - Patient 50-74%  Trunk/Postural Assessment  Cervical Assessment Cervical Assessment: Exceptions to WFL(head forward) Thoracic Assessment Thoracic Assessment: Exceptions to WFL(shoulders rounded) Lumbar Assessment Lumbar Assessment: Exceptions to WFL(post pelviv tilt) Postural Control Postural Control: Deficits on  evaluation(delayed)  Balance Dynamic Sitting Balance Sitting balance - Comments: static sitting balance, preference to R UE support at EOB  Extremity/Trunk Assessment RUE Assessment RUE Assessment: Within Functional Limits LUE Assessment LUE Assessment: (P) Exceptions to [facility] LUE Body System: (P) Neuro Brunstrum levels for arm and hand: (P) Arm;Hand Brunstrum level for arm: (P) Stage I Presynergy Brunstrum level for hand: (P) Stage I Flaccidity     Refer to Care Plan for Long Term Goals  Recommendations for other services: None    Discharge Criteria: Patient will be discharged from OT if patient refuses treatment 3 consecutive times without medical reason, if treatment goals not met, if there is a change in medical status, if patient makes no progress towards goals or if patient is discharged from hospital.  The above assessment, treatment plan, treatment alternatives and goals were discussed and mutually agreed upon: by patient  Tonny Branch 09/04/2019, 12:14 PM

## 2019-09-04 NOTE — Progress Notes (Signed)
Team Conference Report to Patient/Family  Team Conference discussion was reviewed with the patient, including goals, any changes in plan of care and target discharge date.  Patient expressed understanding and is in agreement.  The patient has a target discharge date of  1.5-2.0 weeks from admission  Margarito Liner 09/04/2019, 3:43 PM

## 2019-09-04 NOTE — Care Management (Signed)
Inpatient [facility] Individual Statement of Services  Patient Name:  [name] [name]  Date:  09/04/2019  Welcome to the [facility].  Our goal is to provide you with an individualized program based on your diagnosis and situation, designed to meet your specific needs.  With this comprehensive rehabilitation program, you will be expected to participate in at least 3 hours of rehabilitation therapies Monday-Friday, with modified therapy programming on the weekends.  Your rehabilitation program will include the following services:  Physical Therapy (PT), Occupational Therapy (OT), Speech Therapy (ST), 24 hour per day rehabilitation nursing, Neuropsychology, Case Management (Social Worker), Rehabilitation Medicine, Nutrition Services and Pharmacy Services  Weekly team conferences will be held on Wednesdays to discuss your progress.  Your Social Worker will talk with you frequently to get your input and to update you on team discussions.  Team conferences with you and your family in attendance may also be held.  Expected length of stay: 1.5 2 weeks Overall anticipated outcome: Supervision overall  Depending on your progress and recovery, your program may change. Your Social Worker will coordinate services and will keep you informed of any changes. Your Social Worker's name and contact numbers are listed  below.  The following services may also be recommended but are not provided by the [facility]:    [facility] will be made to provide these services after discharge if needed.  Arrangements include referral to agencies that provide these services.  Your insurance has been verified to be:  [facility] Medicare Your primary doctor is:  Dr. Bernerd Limbo  Pertinent information will be shared with your doctor and your insurance company.  Social Worker:  [place], [place]  or ([phone]   Information discussed with and copy given to patient by: Margarito Liner, 09/04/2019, 8:21 AM

## 2019-09-04 NOTE — Evaluation (Signed)
Physical Therapy Assessment and Plan  Patient Details  Name: RANI SISNEY MRN: [id number] Date of Birth: 09/20/1955  PT Diagnosis: Abnormal posture, Abnormality of gait, Difficulty walking, Edema, Hemiplegia non-dominant, Hypertonia, Impaired cognition, Low back pain and Muscle weakness Rehab Potential: Good ELOS: 2-2.5 weeks   Today's Date: 09/04/2019 PT Individual Time: 7353-2992 PT Individual Time Calculation (min): 70 min    Problem List:  Patient Active Problem List   Diagnosis Date Noted  . Acute on chronic anemia   . Lymphopenia   . Hypoalbuminemia due to protein-calorie malnutrition ([place])   . Controlled type 2 diabetes mellitus with hyperglycemia, without long-term current use of insulin ([place])   . Right pontine CVA ([place]) 09/03/2019  . Acute ischemic stroke ([place]) 08/29/2019  . Hyponatremia 08/29/2019  . HTN (hypertension) 08/29/2019  . DM2 (diabetes mellitus, type 2) ([place]) 08/29/2019  . CLL (chronic lymphocytic leukemia) ([place]) 04/10/2019  . Counseling regarding advance care planning and goals of care 04/10/2019  . Deviated septum 05/18/2018  . Iron deficiency anemia 02/26/2018    Past Medical History:  Past Medical History:  Diagnosis Date  . Allergic rhinitis, cause unspecified   . Backache, unspecified   . Body mass index 33.0-33.9, adult   . Diabetes mellitus without complication ([place])    Type II  . Elevated blood pressure reading without diagnosis of hypertension   . Esophageal reflux   . Generalized pain   . Heartburn   . Insomnia, unspecified   . 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 Other abnormal blood chemistry   . Other and unspecified hyperlipidemia   . Other dyspnea and respiratory abnormality   . Other nonspecific findings on examination of blood(790.99)   . Pain in joint, lower leg   . Pneumonia    1968  . Sleep apnea    Does not use or own a CPAP  . Tobacco use disorder   . Unspecified  essential hypertension    Past Surgical History:  Past Surgical History:  Procedure Laterality Date  . bil wrist surgery    . ENDOSCOPIC CONCHA BULLOSA RESECTION Bilateral 05/18/2018   Procedure: ENDOSCOPIC CONCHA BULLOSA RESECTION;  Surgeon: Jerrell Belfast, MD;  Location: [facility];  Service: ENT;  Laterality: Bilateral;  . NASAL SEPTOPLASTY W/ TURBINOPLASTY Bilateral 05/18/2018   Procedure: NASAL SEPTOPLASTY WITH TURBINATE REDUCTION;  Surgeon: Jerrell Belfast, MD;  Location: [facility];  Service: ENT;  Laterality: Bilateral;  . ORIF FOREARM FRACTURE     right  . plastic surgery to face    . SINUS ENDO W/FUSION Bilateral 05/18/2018   Procedure: ENDOSCOPIC SINUS SURGERY WITH NAVIGATION;  Surgeon: Jerrell Belfast, MD;  Location: [facility];  Service: ENT;  Laterality: Bilateral;    Assessment & Plan Clinical Impression: Patient is a 64 y.o. year old right-handed male with history of diabetes mellitus (a1c is 5.9) , chronic hyponatremia, hypertension, remote tobacco use, CLL/small B-cell lymphoma followed by Dr. Irene Limbo with last chemotherapy October 2020.  Per chart review patient lives with a 56 year old mother.  Independent prior to admission.  1 level home with ramped entrance.  Mother has an aide that assists her for a couple of hours daily Patient reports being on disability due to [place] in early 2000s. .  Presented 08/29/2019 with left-sided weakness and mild dysarthria.  Admission labs with hemoglobin 9.8, sodium 128, urine drug screen negative, SARS coronavirus negative, hemoglobin 9.8, hematocrit 28.8, platelet 310,000, WBC 4.3.  Cranial CT scan unremarkable.  Patient did  not receive TPA.  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  CT angiogram of head and neck with no emergent large vessel occlusion or high-grade stenosis.  CT of chest abdomen pelvis showed improving lymphadenopathy in the chest abdomen pelvis.  Echocardiogram with ejection fraction 65% without emboli.  Neurology  follow-up currently maintained on Plavix for CVA prophylaxis.  Patient was placed in [place] bayer factor XI inhibitor stroke prevention trial.  Subcutaneous Lovenox for DVT prophylaxis.  Persistent hyponatremia 126-130 and initially maintained on normal saline.  Tolerating a regular consistency diet.  Therapy evaluations completed and patient was admitted for a comprehensive rehab program.  Patient transferred to CIR on 09/03/2019 .   Patient currently requires mod with mobility secondary to muscle weakness, decreased cardiorespiratoy endurance, impaired timing and sequencing, abnormal tone, unbalanced muscle activation, motor apraxia, decreased coordination and decreased motor planning, decreased visual acuity and decreased visual motor skills, decreased attention to left and decreased motor planning, decreased initiation, decreased problem solving and delayed processing and decreased sitting balance, decreased standing balance, decreased postural control, hemiplegia and decreased balance strategies.  Prior to hospitalization, patient was independent  with mobility and lived with Family in a House home.  Home access is  Ramped entrance.  Patient will benefit from skilled PT intervention to maximize safe functional mobility, minimize fall risk and decrease caregiver burden for planned discharge home with 24 hour supervision.  Anticipate patient will benefit from follow up [place] at discharge.  PT - End of Session Activity Tolerance: Tolerates 30+ min activity with multiple rests Endurance Deficit: Yes Endurance Deficit Description: Required frequent rest breaks and mild SOB with minimal activity, lethargic throughout evaluation PT Assessment Rehab Potential (ACUTE/IP ONLY): Good PT Barriers to Discharge: Lack of/limited family support PT Barriers to Discharge Comments: Patient's son can provide PRN assist at home only PT Patient demonstrates impairments in the following area(s):  Balance;Edema;Endurance;Motor;Nutrition;Pain;Perception;Safety;Skin Integrity PT Transfers Functional Problem(s): Bed Mobility;Bed to Chair;Car;Furniture;Floor PT Locomotion Functional Problem(s): Ambulation;Wheelchair Mobility;Stairs PT Plan PT Intensity: Minimum of 1-2 x/day ,45 to 90 minutes PT Frequency: 5 out of 7 days PT Duration Estimated Length of Stay: 2-2.5 weeks PT Treatment/Interventions: Ambulation/gait training;Discharge planning;Functional mobility training;Psychosocial support;Therapeutic Activities;Visual/perceptual remediation/compensation;Balance/vestibular training;Disease management/prevention;Neuromuscular re-education;Skin care/wound management;Therapeutic Exercise;Wheelchair propulsion/positioning;Cognitive remediation/compensation;DME/adaptive equipment instruction;Pain management;Splinting/orthotics;UE/LE Strength taining/ROM;Community reintegration;Functional electrical stimulation;Patient/family education;Stair training;UE/LE Coordination activities PT Transfers Anticipated Outcome(s): Supervision using LRAD. PT Locomotion Anticipated Outcome(s): Supervision using LRAD 50 feet. PT Recommendation Recommendations for Other Services: Therapeutic Recreation consult Therapeutic Recreation Interventions: Stress management Follow Up Recommendations: Home health PT Patient destination: Home Equipment Details: Patient has no DME, will determine needs based on patient progress.  Skilled Therapeutic Intervention In addition to the PT evaluation below, the patient performed the following skilled PT interventions: Patient in w/c in room upon PT arrival. Patient alert and agreeable to PT session. Patient denied pain throughout session.   Therapeutic Activity: Bed Mobility: Patient rolling R/L with min A and performed supine to/from sit with mod A for trunk and LE management on a mat table. Educated patient on benefit of performing log rolling to R side to improve pushing to sit up  following bed mobility assessment.  Transfers: Patient performed sit to/from stand x1 and squat pivot to the L x1 with mod A without an AD. He also performed sit to from stand x2 with mod-min A with cues for scooting forward and leaning forward to stand and reaching back to sit to control descent.  Patient attempted a simulated sedan height car transfer,  demonstrating safety concerns without skilled intervention such as reaching for the door to stand. PT provided cues for performing a squat pivot transfer to car with mod-min A, however once on the seat the patient reported extreme fatigue and double vision. Able to correct double vision focusing on therapists finger with cues. Patient reported "split vision" with movement. Patient requested to return to w/c and performed a squat pivot transfer with min A to the R back to the w/c with cues for hand placement and head hips relationship.  Gait Training:  Patient ambulated 4 feet using no AD with R hand placed on therapist's shoulder and therapist providing manual facilitation and physical support using a gait belt standing in front of the patient with max A. Ambulated as described below. Required manual assist for initiating first step with L LE then was able to advance L LE independently with mod adductor tone with PT blocking foot and L knee to prevent scissoring and L knee buckling.   Wheelchair Mobility:  Patient was unable to propelled the wheelchair without skilled intervention. He then propelled the wheelchair 35 feet with min A for steering using R hemi technique with a pillow behind him to promote improved hamstring activation. Provided verbal cues for hemi propulsion technique, steering, and turning technique.   Patient in bed asleep at end of session with breaks locked, bed alarm set, and all needs within reach.   Instructed pt in results of PT evaluation as detailed below, PT POC, rehab potential, rehab goals, and discharge recommendations.  Additionally discussed CIR's policies regarding fall safety and use of chair alarm and/or quick release belt. Pt verbalized understanding and in agreement. Will update pt's family members as they become available.    PT Evaluation Precautions/Restrictions Precautions Precautions: Fall Precaution Comments: L hemiparesis Restrictions Weight Bearing Restrictions: No General   Vital Signs  Pain Pain Assessment Pain Score: 0-No pain Home Living/Prior Functioning Home Living Available Help at Discharge: Family Type of Home: House Home Access: Ramped entrance Home Layout: One level Bathroom Shower/Tub: Walk-in shower(reports having shower seat) Bathroom Toilet: Standard Bathroom Accessibility: Yes  Lives With: Family Prior Function Level of Independence: Independent with basic ADLs;Independent with homemaking with ambulation;Independent with gait;Independent with transfers  Able to Take Stairs?: Yes Vocation: On disability Comments: pt reports being retired from working for [employer], assists his 44 y/o mother as needed. Pt's mother has an aide that assists her in the am for a couple of hours. Vision/Perception  Vision - Assessment Eye Alignment: Within Functional Limits Ocular Range of Motion: Within Functional Limits Alignment/Gaze Preference: Within Defined Limits Tracking/Visual Pursuits: Decreased smoothness of eye movement to RIGHT superior field;Decreased smoothness of horizontal tracking Saccades: Undershoots Diplopia Assessment: Disappears with one eye closed;Objects split side to side;Present in far gaze;Other (comment)(increased with motion) Perception Perception: Impaired Inattention/Neglect: Does not attend to left side of body Praxis Praxis: Impaired Praxis Impairment Details: Initiation  Cognition Overall Cognitive Status: Impaired/Different from baseline Arousal/Alertness: Lethargic Orientation Level: Oriented X4 Selective Attention: Appears  intact Memory: Appears intact Memory Impairment: Other (comment) Immediate Memory Recall: Sock;Blue;Bed Memory Recall Sock: Not able to recall(able to choose in field of 2) Memory Recall Blue: Without Cue Memory Recall Bed: Not able to recall(able to choose in field of 2) Awareness: Appears intact Problem Solving: Impaired Problem Solving Impairment: Functional basic Safety/Judgment: Appears intact Sensation Sensation Light Touch: Appears Intact Proprioception: Appears Intact Coordination Gross Motor Movements are Fluid and Coordinated: No Fine Motor Movements are Fluid and Coordinated: No  Coordination and Movement Description: L hemi Motor  Motor Motor: Hemiplegia Motor - Skilled Clinical Observations: Dense L hemi UE>LE  Mobility Bed Mobility Bed Mobility: Rolling Right;Rolling Left;Supine to Sit;Sit to Supine Rolling Right: Minimal Assistance - Patient > 75% Rolling Left: Minimal Assistance - Patient > 75% Supine to Sit: Moderate Assistance - Patient 50-74% Sit to Supine: Moderate Assistance - Patient 50-74% Transfers Transfers: Sit to Stand;Stand to Sit;Stand Pivot Transfers;Squat Pivot Transfers Sit to Stand: Moderate Assistance - Patient 50-74% Stand to Sit: Moderate Assistance - Patient 50-74% Stand Pivot Transfers: Moderate Assistance - Patient 50 - 74% Stand Pivot Transfer Details: Manual facilitation for weight bearing;Manual facilitation for weight shifting Squat Pivot Transfers: Moderate Assistance - Patient 50-74% Transfer (Assistive device): None Locomotion  Gait Ambulation: Yes Gait Assistance: Maximal Assistance - Patient 25-49% Gait Distance (Feet): 4 Feet Assistive device: 1 person hand held assist Gait Assistance Details: Manual facilitation for weight bearing;Manual facilitation for weight shifting;Manual facilitation for placement Gait Gait: Yes Gait Pattern: Step-to pattern;Decreased stance time - left;Decreased stride length;Decreased hip/knee  flexion - left;Decreased dorsiflexion - left;Decreased weight shift to left;Left flexed knee in stance;Lateral trunk lean to right;Decreased trunk rotation;Trunk rotated posteriorly on left;Narrow base of support;Poor foot clearance - left Gait velocity: decreased Stairs / Additional Locomotion Stairs: No(Patient lethargic during session with safety concerns) Wheelchair Mobility Wheelchair Mobility: No(L hemi, patient unable without skilled intervention)  Trunk/Postural Assessment  Cervical Assessment Cervical Assessment: Exceptions to WFL(head forward) Thoracic Assessment Thoracic Assessment: Exceptions to WFL(shoulders rounded) Lumbar Assessment Lumbar Assessment: Exceptions to WFL(post pelviv tilt) Postural Control Postural Control: Deficits on evaluation(delayed)  Balance Balance Balance Assessed: Yes Static Sitting Balance Static Sitting - Balance Support: No upper extremity supported;Feet unsupported Static Sitting - Level of Assistance: 5: Stand by assistance Dynamic Sitting Balance Dynamic Sitting - Balance Support: Right upper extremity supported;Feet supported;During functional activity Dynamic Sitting - Level of Assistance: 4: Min assist Dynamic Sitting - Balance Activities: Lateral lean/weight shifting;Forward lean/weight shifting;Reaching for objects Sitting balance - Comments: static sitting balance, preference to R UE support at EOB  Static Standing Balance Static Standing - Balance Support: Right upper extremity supported;During functional activity Static Standing - Level of Assistance: 4: Min assist Dynamic Standing Balance Dynamic Standing - Balance Support: Right upper extremity supported;During functional activity Dynamic Standing - Level of Assistance: 2: Max assist Dynamic Standing - Balance Activities: Lateral lean/weight shifting;Forward lean/weight shifting Extremity Assessment  RUE Assessment RUE Assessment: Within Functional Limits LUE Assessment LUE  Assessment: Exceptions to [facility] LUE Body System: Neuro Brunstrum levels for arm and hand: Arm;Hand Brunstrum level for arm: Stage I Presynergy Brunstrum level for hand: Stage I Flaccidity RLE Assessment RLE Assessment: Within Functional Limits Active Range of Motion (AROM) Comments: WFL for all functional moiblity, hamstring and gastroc tightness General Strength Comments: Grossly in sitting: 5/5 throughout LLE Assessment LLE Assessment: Exceptions to [facility] Active Range of Motion (AROM) Comments: WFL for all functional mobility, hamstring and gastroc tightness noted General Strength Comments: Grossly in sitting: 2-2+/5 throughout, except PF 0/5 LLE Tone LLE Tone: Mild;Modified Ashworth Modified Ashworth Scale for Grading Hypertonia LLE: Slight increase in muscle tone, manifested by a catch, followed by minimal resistance throughout the remainder (less than half) of the ROM    Refer to Care Plan for Long Term Goals  Recommendations for other services: Therapeutic Recreation  Stress management  Discharge Criteria: Patient will be discharged from PT if patient refuses treatment 3 consecutive times without medical reason, if treatment goals not met, if there is  a change in medical status, if patient makes no progress towards goals or if patient is discharged from hospital.  The above assessment, treatment plan, treatment alternatives and goals were discussed and mutually agreed upon: by patient  Doreene Burke PT, DPT  09/04/2019, 2:15 PM

## 2019-09-04 NOTE — Progress Notes (Signed)
Patient c/o left shoulder and left lower leg pain when moving. Patient refused any pain medicine and requested to speak with the doctor about lab results and treatment.

## 2019-09-04 NOTE — Progress Notes (Addendum)
[facility] PHYSICAL MEDICINE & REHABILITATION PROGRESS NOTE  Subjective/Complaints: Patient seen sitting up in bed this morning that she had pulled up over his head.  He states he slept fairly well overnight.  He complains of left shoulder pain.  He states he is very to begin therapies today.  ROS: + Left shoulder pain.  Denies CP, shortness of breath, nausea, vomiting, diarrhea.  Objective: Vital Signs: Blood pressure 130/67, pulse 63, temperature 99.9 F (37.7 C), temperature source Oral, resp. rate 16, weight 73.8 kg, SpO2 98 %. No results found. Recent Labs    09/02/19 0116 09/04/19 0519  WBC  --  3.5*  HGB 8.7* 8.6*  HCT 25.5* 25.2*  PLT  --  330   Recent Labs    09/03/19 0148 09/04/19 0519  NA 126* 128*  K 3.6 3.7  CL 97* 96*  CO2 20* 21*  GLUCOSE 104* 109*  BUN 6* 7*  CREATININE 0.80 0.97  CALCIUM 8.6* 9.2    Physical Exam: BP 130/67 (BP Location: Right Arm)   Pulse 63   Temp 99.9 F (37.7 C) (Oral)   Resp 16   Wt 73.8 kg   SpO2 98%   BMI 25.48 kg/m  Constitutional: No distress . Vital signs reviewed. HENT: Normocephalic.  Atraumatic. Eyes: EOMI. No discharge. Cardiovascular: No JVD. Respiratory: Normal effort.  No stridor. GI: Non-distended. Skin: Warm and dry.  Intact. Psych: Normal mood.  Normal behavior. Musc: Left shoulder TTP Neuro: Alert and oriented Motor: RUE/RLE: 5/5 proximal distal LUE: 0/5 proximal distal LLE: Flexion, knee extension 1+/5, ankle dorsiflexion 0/5 No increase in tone appreciated Left lower extremity clonus  Assessment/Plan: 1. Functional deficits secondary to right paramedian pontine infarct which require 3+ hours per day of interdisciplinary therapy in a comprehensive inpatient rehab setting.  Physiatrist is providing close team supervision and 24 hour management of active medical problems listed below.  Physiatrist and rehab team continue to assess barriers to discharge/monitor patient progress toward functional  and medical goals  Care Tool:  Bathing  Bathing activity did not occur: Safety/medical concerns           Bathing assist       Upper Body Dressing/Undressing Upper body dressing   What is the patient wearing?: Hospital gown only    Upper body assist Assist Level: Maximal Assistance - Patient 25 - 49%    Lower Body Dressing/Undressing Lower body dressing      What is the patient wearing?: Incontinence brief     Lower body assist Assist for lower body dressing: Total Assistance - Patient < 25%     Toileting Toileting    Toileting assist Assist for toileting: Moderate Assistance - Patient 50 - 74%     Transfers Chair/bed transfer  Transfers assist  Chair/bed transfer activity did not occur: Safety/medical concerns        Locomotion Ambulation   Ambulation assist              Walk 10 feet activity   Assist           Walk 50 feet activity   Assist           Walk 150 feet activity   Assist           Walk 10 feet on uneven surface  activity   Assist           Wheelchair     Assist  Wheelchair 50 feet with 2 turns activity    Assist            Wheelchair 150 feet activity     Assist            Medical Problem List and Plan: 1.  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   Discussed plan of care with therapies 2.  Antithrombotics: -DVT/anticoagulation: Lovenox             -antiplatelet therapy: Plavix 75 mg daily patient placed in [employer] factor XI inhibitor stroke prevention trial 3. Pain Management: Tylenol as  needed 4. Mood: Provide emotional support             -antipsychotic agents: N/A 5. Neuropsych: This patient is capable of making decisions on his own behalf. 6. Skin/Wound Care: Routine skin checks 7. Fluids/Electrolytes/Nutrition: Routine in and outs 8.  Chronic hyponatremia.  Low sodium in September through November 2020.    IVFs 100cc/hr d/ced  Na 128 on 1/13 9.  CLL/small B-cell lymphoma.  Follow-up Dr.Kale.  Patient last received chemotherapy October 2020. 10.  Type 2 diabetes mellitus with hyperglycemia.  Hemoglobin A1c 5.9.  Glucophage currently held.  Continue sliding scale  Monitor with increased mobility 11.  Hyperlipidemia.  Lipitor 12.  Essential hypertension.    Monitor with increased mobility 13. Mild Spasticity:  Cont to monitor 14. OSA: refuses to wear CPAP 15. Hypoalbuminemia  Supplement initiated on 1/13 16. Leukopenia  WBCs 3.5 on 1/13, labs ordered for tomorrow  See #9  Cont to monitor 17. Acute on chronic anemia  Hb 8.6 on 1/13, labs ordered for tomorrow   LOS: 1 days A FACE TO FACE EVALUATION WAS PERFORMED  Keondria Siever Lorie Phenix 09/04/2019, 8:56 AM

## 2019-09-04 NOTE — H&P (Signed)
Physical Medicine and Rehabilitation Admission H&P     Chief Complaint  Patient presents with  . Weakness  :  HPI: [name] [name] is a 64 year old right-handed male with history of diabetes mellitus (a1c is 5.9) , chronic hyponatremia, hypertension, remote tobacco use, CLL/small B-cell lymphoma followed by Dr. Irene Limbo with last chemotherapy October 2020. Per chart review patient lives with a 64 year old mother. Independent prior to admission. 1 level home with ramped entrance. Mother has an aide that assists her for a couple of hours daily Patient reports being on disability due to [employer] in early 2000s. . Presented 08/29/2019 with left-sided weakness and mild dysarthria. Admission labs with hemoglobin 9.8, sodium 128, urine drug screen negative, SARS coronavirus negative, hemoglobin 9.8, hematocrit 28.8, platelet 310,000, WBC 4.3. Cranial CT scan unremarkable. Patient did not receive TPA. 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 CT angiogram of head and neck with no emergent large vessel occlusion or high-grade stenosis. CT of chest abdomen pelvis showed improving lymphadenopathy in the chest abdomen pelvis. Echocardiogram with ejection fraction 65% without emboli. Neurology follow-up currently maintained on Plavix for CVA prophylaxis. Patient was placed in [place] bayer factor XI inhibitor stroke prevention trial. Subcutaneous Lovenox for DVT prophylaxis. Persistent hyponatremia 126-130 and initially maintained on normal saline. Tolerating a regular consistency diet. Therapy evaluations completed and patient was admitted for a comprehensive rehab program.  Michela Pitcher he needs his ears cleaned out-- "comes to the hospital every year to clean out his ears" and needs wax "gone".  Denies any other Sx's other than L sided weakness.  Currently on IVFs 100 cc/hr for hyponatremia.  Doesn't wear CPAP at home for OSA.  Complains that his L forehead feels "swollen" like skin is pulled tight over  swelling.  No specific area of head except was on L- kept pointing toward forehead, cheek and temple.  Review of Systems  Constitutional: Negative for chills and fever.  HENT: Negative for hearing loss.  Eyes: Negative for blurred vision and double vision.  Respiratory: Negative for cough and shortness of breath.  Cardiovascular: Positive for leg swelling. Negative for chest pain.  Gastrointestinal: Positive for constipation and heartburn. Negative for nausea and vomiting.  GERD  Genitourinary: Negative for dysuria and hematuria.  Musculoskeletal: Positive for joint pain and myalgias.  Skin: Negative for rash.  Neurological: Positive for speech change and weakness.  Psychiatric/Behavioral: The patient has insomnia.  All other systems reviewed and are negative.       Past Medical History:  Diagnosis Date  . Allergic rhinitis, cause unspecified   . Backache, unspecified   . Body mass index 33.0-33.9, adult   . Diabetes mellitus without complication ([place])    Type II  . Elevated blood pressure reading without diagnosis of hypertension   . Esophageal reflux   . Generalized pain   . Heartburn   . Insomnia, unspecified   . 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 Other abnormal blood chemistry   . Other and unspecified hyperlipidemia   . Other dyspnea and respiratory abnormality   . Other nonspecific findings on examination of blood(790.99)   . Pain in joint, lower leg   . Pneumonia    1968  . Sleep apnea    Does not use or own a CPAP  . Tobacco use disorder   . Unspecified essential hypertension         Past Surgical History:  Procedure Laterality Date  . bil wrist surgery    .  ENDOSCOPIC CONCHA BULLOSA RESECTION Bilateral 05/18/2018   Procedure: ENDOSCOPIC CONCHA BULLOSA RESECTION; Surgeon: Jerrell Belfast, MD; Location: [place]; Service: ENT; Laterality: Bilateral;  . NASAL SEPTOPLASTY W/ TURBINOPLASTY Bilateral 05/18/2018    Procedure: NASAL SEPTOPLASTY WITH TURBINATE REDUCTION; Surgeon: Jerrell Belfast, MD; Location: [place]; Service: ENT; Laterality: Bilateral;  . ORIF FOREARM FRACTURE     right  . plastic surgery to face    . SINUS ENDO W/FUSION Bilateral 05/18/2018   Procedure: ENDOSCOPIC SINUS SURGERY WITH NAVIGATION; Surgeon: Jerrell Belfast, MD; Location: [facility]; Service: ENT; Laterality: Bilateral;        Family History  Problem Relation Age of Onset  . Diabetes Mother   . Hypertension Mother   . Cancer Paternal Uncle    Social History: reports that he quit smoking about 7 years ago. His smoking use included cigars. He has a 4.00 pack-year smoking history. He has never used smokeless tobacco. He reports current alcohol use. He reports that he does not use drugs.  Allergies:      Allergies  Allergen Reactions  . Lisinopril Itching  . Tuberculin Tests Swelling         Medications Prior to Admission  Medication Sig Dispense Refill  . amLODipine (NORVASC) 10 MG tablet Take 10 mg by mouth daily.    [name] atorvastatin (LIPITOR) 20 MG tablet Take 20 mg by mouth daily.    . cyclobenzaprine (FLEXERIL) 10 MG tablet Take 10 mg by mouth at bedtime.    [name] losartan (COZAAR) 50 MG tablet Take 50 mg by mouth daily.    [name] oxyCODONE-acetaminophen (PERCOCET/ROXICET) 5-325 MG tablet Take 1 tablet by mouth 3 (three) times daily as needed for pain.  0  . pantoprazole (PROTONIX) 40 MG tablet Take 40 mg by mouth at bedtime.     . potassium chloride SA (K-DUR,KLOR-CON) 20 MEQ tablet Take 20 mEq by mouth daily.    [name] zolpidem (AMBIEN) 10 MG tablet Take 10 mg by mouth at bedtime.     [name] acyclovir (ZOVIRAX) 400 MG tablet Take 1 tablet (400 mg total) by mouth daily. (Patient not taking: Reported on 08/29/2019) 30 tablet 3  . allopurinol (ZYLOPRIM) 300 MG tablet Take 0.5 tablets (150 mg total) by mouth daily. (Patient not taking: Reported on 08/29/2019) 30 tablet 3  . dexamethasone (DECADRON) 4 MG tablet Take 2 tablets (8 mg total) by  mouth daily. Start the day after bendamustine chemotherapy for 2 days. Take with food. (Patient not taking: Reported on 08/29/2019) 30 tablet 1  . ondansetron (ZOFRAN) 8 MG tablet Take 1 tablet (8 mg total) by mouth 2 (two) times daily as needed for refractory nausea / vomiting. Start on day 2 after bendamustine chemotherapy. (Patient not taking: Reported on 08/29/2019) 30 tablet 1  . prochlorperazine (COMPAZINE) 10 MG tablet Take 1 tablet (10 mg total) by mouth every 6 (six) hours as needed (Nausea or vomiting). (Patient not taking: Reported on 08/29/2019) 30 tablet 1   Drug Regimen Review  Drug regimen was reviewed and remains appropriate with no significant issues identified  Home:  Home Living  Family/patient expects to be discharged to:: Private residence  Living Arrangements: Parent  Available Help at Discharge: Family  Type of Home: House  Home Access: Ramped entrance  [place]: One level  Bathroom Shower/Tub: Tourist information centre manager: [place]: None  Lives With: Family  mother lives with him- has ramp; 1 story house;  Functional History:  Prior Function  Level of Independence: Independent  Comments: pt reports being retired from working for [employer], assists his 71 y/o mother as needed. Pt's mother has an aide that assists her in the am for a couple of hours.  Functional Status:  Mobility:  Bed Mobility  Overal bed mobility: Needs Assistance  Bed Mobility: Supine to Sit  Supine to sit: HOB elevated, Min assist  Sit to supine: Max assist, HOB elevated  General bed mobility comments: +rail, cues for sequencing, assist to elevate trunk  Transfers  Overall transfer level: Needs assistance  Equipment used: 2 person hand held assist  Transfers: Sit to/from Stand, Stand Pivot Transfers  Sit to Stand: Min assist, +2 physical assistance  Stand pivot transfers: Mod assist, +2 physical assistance  General transfer comment: +2 minA to powerup into  standing;modA+2 for pivot toward recliner on right, required L knee blocking  Ambulation/Gait  General Gait Details: pre-gait activities in stance with BUE support, lateral weight shifts, +2 min/mod assist.   ADL:  ADL  Overall ADL's : Needs assistance/impaired  Grooming: Minimal assistance, Sitting  Upper Body Bathing: Moderate assistance, Sitting  Lower Body Bathing: Maximal assistance, +2 for physical assistance, +2 for safety/equipment, Sit to/from stand  Upper Body Dressing : Moderate assistance, Sitting  Lower Body Dressing: Moderate assistance, Maximal assistance, Sit to/from stand, +2 for physical assistance  Toilet Transfer: Moderate assistance, Ambulation, +2 for physical assistance, +2 for safety/equipment  Toilet Transfer Details (indicate cue type and reason): took 4 side steps towards right to pivot to the recliner, pt required modA+2  Functional mobility during ADLs: Moderate assistance, +2 for physical assistance, Minimal assistance  General ADL Comments: limited by L hemiparesis, impaired balance, decreased activity tolerance  Cognition:  Cognition  Overall Cognitive Status: Impaired/Different from baseline  Arousal/Alertness: Awake/alert  Orientation Level: Oriented X4  Attention: Selective  Selective Attention: Appears intact  Memory: Impaired  Memory Impairment: Other (comment)(working memory)  Awareness: Appears intact  Problem Solving: Impaired  Problem Solving Impairment: Verbal complex  Cognition  Arousal/Alertness: Awake/alert  Behavior During Therapy: WFL for tasks assessed/performed  Overall Cognitive Status: Impaired/Different from baseline  Area of Impairment: Problem solving, Safety/judgement, Following commands  Following Commands: Follows multi-step commands with increased time  Safety/Judgement: Decreased awareness of safety  Problem Solving: Slow processing, Difficulty sequencing, Requires verbal cues  General Comments: pt with slow processing,  requiring increased time to respond  Physical Exam:  Blood pressure 128/71, pulse (!) 59, temperature 98.3 F (36.8 C), temperature source Axillary, resp. rate 18, height 5\' 7"  (1.702 m), weight 68 kg, SpO2 98 %.  Physical Exam  Nursing note and vitals reviewed.  Constitutional: He is oriented to person, place, and time. He appears well-developed and well-nourished.  Just spilled urinal trying to void on underpad, frustrated, sitting up in bedside chair, nursing trying to get him cleaned up, NAD  HENT:  Head: Normocephalic and atraumatic.  Mouth/Throat: No oropharyngeal exudate.  Didn't want to smile, but didn't appear to have any facial asymmetry at rest; nor would he stick his tongue out to determine if asymmetrical; said facial sensation intact  Eyes: Pupils are equal, round, and reactive to light. No scleral icterus.  EOM grossly intact; didn't see nystagmus; refused to do EOM formal testing  Neck: No tracheal deviation present.  Cardiovascular:  RRR  Respiratory: Breath sounds normal.  CTA B/L  GI: Soft.  Soft, NT, ND, (+)BS hypoactive  Musculoskeletal:  Cervical back: Normal range of motion and neck supple.  Comments: RUE 5/5 in deltoid, biceps, WE,  triceps, grip and finger abd LUE 0/5- refused to try and move LUE- said "didn't move"  Shoulder shrug intact on L RLE_ 5/5 in HF, KE,KF, DF,PF and EHL  LLE- HF 1/5, KE 1/5, KF 0/5, DF 0/5, PF 1/5, EHL 0/5  Pain with ROM of L knee- hamstring is tight as well  Neurological: He is alert and oriented to person, place, and time.  Patient is alert in no acute distress. Makes good eye contact with examiner. Provides his name age and date of birth. Follows simple commands. Fair awareness of his deficits. Cannot move LUE, so cannot test coordination - refused to check R side- "it's fine".  (+) hoffman's LUE; equivocal Clonus on LLE- 2 beats Sensation to light touch Intact in all 4 extremities AND face intact  Skin: Skin is warm and dry.   Peripheral IV in L forearm- no signs of infiltrate- running IVFs  Psychiatric:  Irritable; otherwise flattened affect   Lab Results Last 48 Hours        Results for orders placed or performed during the hospital encounter of 08/29/19 (from the past 48 hour(s))  Glucose, capillary Status: Abnormal   Collection Time: 09/01/19 11:50 AM  Result Value Ref Range   Glucose-Capillary 131 (H) 70 - 99 mg/dL  Glucose, capillary Status: Abnormal   Collection Time: 09/01/19 4:22 PM  Result Value Ref Range   Glucose-Capillary 106 (H) 70 - 99 mg/dL  Glucose, capillary Status: Abnormal   Collection Time: 09/01/19 9:16 PM  Result Value Ref Range   Glucose-Capillary 112 (H) 70 - 99 mg/dL  Basic metabolic panel Status: Abnormal   Collection Time: 09/02/19 1:16 AM  Result Value Ref Range   Sodium 129 (L) 135 - 145 mmol/L   Potassium 3.3 (L) 3.5 - 5.1 mmol/L   Chloride 100 98 - 111 mmol/L   CO2 21 (L) 22 - 32 mmol/L   Glucose, Bld 100 (H) 70 - 99 mg/dL   BUN 7 (L) 8 - 23 mg/dL   Creatinine, Ser 0.79 0.61 - 1.24 mg/dL   Calcium 9.0 8.9 - 10.3 mg/dL   GFR calc non Af Amer >60 >60 mL/min   GFR calc Af Amer >60 >60 mL/min   Anion gap 8 5 - 15    Comment: Performed at [facility], [address] [place], [place] [address]  AM Hemoglobin and hematocrit, blood Status: Abnormal   Collection Time: 09/02/19 1:16 AM  Result Value Ref Range   Hemoglobin 8.7 (L) 13.0 - 17.0 g/dL   HCT 25.5 (L) 39.0 - 52.0 %    Comment: Performed at [facility], [place] [address] [place], [place] [address]  Magnesium Status: Abnormal   Collection Time: 09/02/19 1:16 AM  Result Value Ref Range   Magnesium 1.6 (L) 1.7 - 2.4 mg/dL    Comment: Performed at [facility] [address], [place], [place] [address]  Glucose, capillary Status: Abnormal   Collection Time: 09/02/19 6:02 AM  Result Value Ref Range   Glucose-Capillary 151 (H) 70 - 99 mg/dL  Glucose, capillary Status: Abnormal   Collection  Time: 09/02/19 7:53 AM  Result Value Ref Range   Glucose-Capillary 118 (H) 70 - 99 mg/dL   Comment 1 Notify RN    Comment 2 Document in Chart   Glucose, capillary Status: Abnormal   Collection Time: 09/02/19 11:32 AM  Result Value Ref Range   Glucose-Capillary 121 (H) 70 - 99 mg/dL  Glucose, capillary Status: Abnormal  Collection Time: 09/02/19 4:06 PM  Result Value Ref Range   Glucose-Capillary 156 (H) 70 - 99 mg/dL   Comment 1 Notify RN    Comment 2 Document in Chart   Glucose, capillary Status: Abnormal   Collection Time: 09/02/19 9:29 PM  Result Value Ref Range   Glucose-Capillary 128 (H) 70 - 99 mg/dL   Comment 1 Notify RN    Comment 2 Document in Chart   Basic metabolic panel Status: Abnormal   Collection Time: 09/03/19 1:48 AM  Result Value Ref Range   Sodium 126 (L) 135 - 145 mmol/L   Potassium 3.6 3.5 - 5.1 mmol/L   Chloride 97 (L) 98 - 111 mmol/L   CO2 20 (L) 22 - 32 mmol/L   Glucose, Bld 104 (H) 70 - 99 mg/dL   BUN 6 (L) 8 - 23 mg/dL   Creatinine, Ser 0.80 0.61 - 1.24 mg/dL   Calcium 8.6 (L) 8.9 - 10.3 mg/dL   GFR calc non Af Amer >60 >60 mL/min   GFR calc Af Amer >60 >60 mL/min   Anion gap 9 5 - 15    Comment: Performed at [facility] [address], [place], [place] [address]  Glucose, capillary Status: None   Collection Time: 09/03/19 6:34 AM  Result Value Ref Range   Glucose-Capillary 94 70 - 99 mg/dL   Imaging Results (Last 48 hours)    Medical Problem List and Plan:  1. 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 Antithrombotics:  -DVT/anticoagulation: Lovenox  -antiplatelet therapy: Plavix 75 mg daily patient placed in [employer] factor XI inhibitor stroke prevention trial  3. Pain Management: Tylenol as needed  4. Mood: Provide emotional support  -antipsychotic agents: N/A  5. Neuropsych: This  patient is capable of making decisions on his own behalf.  6. Skin/Wound Care: Routine skin checks  7. Fluids/Electrolytes/Nutrition: Routine in and outs with follow-up chemistries  8. Chronic hyponatremia. Review of chart shows patient did have a low sodium in September through November 2020. Follow-up chemistries- is currently on IVFs 100cc/hr  9. CLL/small B-cell lymphoma. Follow-up Dr.Kale and this was discussed with oncology. Patient last received chemotherapy October 2020  10. Type 2 diabetes mellitus. Hemoglobin A1c 5.9. Glucophage currently held. Continue sliding scale-  11. Hyperlipidemia. Lipitor  12. Essential hypertension. Monitor with increased mobility  13. Ear fullness- said needs his ears "cleaned out" Which is needed before d/c per pt.  14. Mild Spasticity- Hoffman's was (+)- monitor  15. OSA_ refuses to wear CPAP  16. Hypokalemia- last check was 136- will check F/U chemistries.  Lavon Paganini Angiulli, PA-C  09/03/2019  I have personally performed a face to face diagnostic evaluation of this patient and formulated the key components of the plan. Additionally, I have personally reviewed laboratory data, imaging studies, as well as relevant notes and concur with the physician assistant's documentation above.  The patient's status has not changed from the original H&P. Any changes in documentation from the acute care chart have been noted above.

## 2019-09-04 NOTE — Care Management (Signed)
Patient Details  Name: [name] [name] MRN: [id number] Date of Birth: [birth year]  Today's Date: 09/04/2019  Problem List:  Patient Active Problem List   Diagnosis Date Noted  . Acute on chronic anemia   . Lymphopenia   . Hypoalbuminemia due to protein-calorie malnutrition ([place])   . Controlled type 2 diabetes mellitus with hyperglycemia, without long-term current use of insulin ([place])   . 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 Acute ischemic stroke ([place]) 08/29/2019  . Hyponatremia 08/29/2019  . HTN (hypertension) 08/29/2019  . DM2 (diabetes mellitus, type 2) ([place]) 08/29/2019  . CLL (chronic lymphocytic leukemia) ([place]) 04/10/2019  . Counseling regarding advance care planning and goals of care 04/10/2019  . Deviated septum 05/18/2018  . Iron deficiency anemia 02/26/2018   Past Medical History:  Past Medical History:  Diagnosis Date  . Allergic rhinitis, cause unspecified   . Backache, unspecified   . Body mass index 33.0-33.9, adult   . Diabetes mellitus without complication ([place])    Type II  . Elevated blood pressure reading without diagnosis of hypertension   . Esophageal reflux   . Generalized pain   . Heartburn   . Insomnia, unspecified   . 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 Other abnormal blood chemistry   . Other and unspecified hyperlipidemia   . Other dyspnea and respiratory abnormality   . Other nonspecific findings on examination of blood(790.99)   . Pain in joint, lower leg   . Pneumonia    1968  . Sleep apnea    Does not use or own a CPAP  . Tobacco use disorder   . Unspecified essential hypertension    Past Surgical History:  Past Surgical History:  Procedure Laterality Date  . bil wrist surgery    . ENDOSCOPIC CONCHA BULLOSA RESECTION Bilateral 05/18/2018   Procedure: ENDOSCOPIC CONCHA BULLOSA RESECTION;  Surgeon: Jerrell Belfast, MD;  Location: [place];  Service: ENT;  Laterality: Bilateral;  . NASAL  SEPTOPLASTY W/ TURBINOPLASTY Bilateral 05/18/2018   Procedure: NASAL SEPTOPLASTY WITH TURBINATE REDUCTION;  Surgeon: Jerrell Belfast, MD;  Location: [place];  Service: ENT;  Laterality: Bilateral;  . ORIF FOREARM FRACTURE     right  . plastic surgery to face    . SINUS ENDO W/FUSION Bilateral 05/18/2018   Procedure: ENDOSCOPIC SINUS SURGERY WITH NAVIGATION;  Surgeon: Jerrell Belfast, MD;  Location: [place];  Service: ENT;  Laterality: Bilateral;   Social History:  reports that he quit smoking about 7 years ago. His smoking use included cigars. He has a 4.00 pack-year smoking history. He has never used smokeless tobacco. He reports current alcohol use. He reports that he does not use drugs.  Family / Support Systems Patient Roles: Parent, Caregiver Children: Son: Landscape architect  Social History Preferred language: English Religion: Patient Refused Read: Yes Write: Yes Employment Status: Retired Date Retired/Disabled/Unemployed: Surveyor, quantity   Abuse/Neglect Abuse/Neglect Assessment Can Be Completed: Yes Physical Abuse: Denies Verbal Abuse: Denies Sexual Abuse: Denies Exploitation of patient/patient's resources: Denies Self-Neglect: Denies  Emotional Status Pt's affect, behavior and adjustment status: Appropriate mood, flat affect Recent Psychosocial Issues: Insomnia  Patient / Family Perceptions, Expectations & Goals Premorbid pt/family roles/activities: Assisted with care of mother in his home PTA Anticipated changes in roles/activities/participation: Will have son assist with his own care as needed at discharge  Community Resources    Discharge Planning Living Arrangements: Children, Parent Support Systems: Children Type of Residence: Private residence Does the patient have any  problems obtaining your medications?: No Home Management: Son will assist with home management Patient/Family Preliminary Plans: Discharge home with son who is available to assist during the day. [place]  Barriers to Discharge: Decreased caregiver support [place] Barriers to Discharge Comments: Son works 6p-6a, mother at patient's home has aide assistance a couple of hours a day Social Work Anticipated Follow Up Needs: HH/OP Expected length of stay: 2 -2.5 weeks  Clinical Impression Patient alert, cooperative, flat affect and not very talkative. Keeps head covered up with blankets when not in therapy sessions. Appears to have fair understanding of current health status and functional limitations.   Dorien Chihuahua B 09/04/2019, 5:26 PM

## 2019-09-04 NOTE — Evaluation (Signed)
Speech Language Pathology Assessment and Plan  Patient Details  Name: [name] [name] MRN: [id number] Date of Birth: [birth year]  SLP Diagnosis: Cognitive Impairments;Dysarthria  Rehab Potential: Excellent ELOS: 2-2.5 weeks    Today's Date: 09/04/2019 SLP Individual Time: 0932-6712 SLP Individual Time Calculation (min): 55 min   Problem List:  Patient Active Problem List   Diagnosis Date Noted  . Acute on chronic anemia   . Lymphopenia   . Hypoalbuminemia due to protein-calorie malnutrition ([place])   . Controlled type 2 diabetes mellitus with hyperglycemia, without long-term current use of insulin ([place])   . Right pontine CVA ([place]) 09/03/2019  . Acute ischemic stroke ([place]) 08/29/2019  . Hyponatremia 08/29/2019  . HTN (hypertension) 08/29/2019  . DM2 (diabetes mellitus, type 2) ([place]) 08/29/2019  . CLL (chronic lymphocytic leukemia) ([place]) 04/10/2019  . Counseling regarding advance care planning and goals of care 04/10/2019  . Deviated septum 05/18/2018  . Iron deficiency anemia 02/26/2018   Past Medical History:  Past Medical History:  Diagnosis Date  . Allergic rhinitis, cause unspecified   . Backache, unspecified   . Body mass index 33.0-33.9, adult   . Diabetes mellitus without complication ([place])    Type II  . Elevated blood pressure reading without diagnosis of hypertension   . Esophageal reflux   . Generalized pain   . Heartburn   . Insomnia, unspecified   . 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 Other abnormal blood chemistry   . Other and unspecified hyperlipidemia   . Other dyspnea and respiratory abnormality   . Other nonspecific findings on examination of blood(790.99)   . Pain in joint, lower leg   . Pneumonia    1968  . Sleep apnea    Does not use or own a CPAP  . Tobacco use disorder   . Unspecified essential hypertension    Past Surgical History:  Past Surgical History:  Procedure Laterality Date  . bil  wrist surgery    . ENDOSCOPIC CONCHA BULLOSA RESECTION Bilateral 05/18/2018   Procedure: ENDOSCOPIC CONCHA BULLOSA RESECTION;  Surgeon: Jerrell Belfast, MD;  Location: [facility];  Service: ENT;  Laterality: Bilateral;  . NASAL SEPTOPLASTY W/ TURBINOPLASTY Bilateral 05/18/2018   Procedure: NASAL SEPTOPLASTY WITH TURBINATE REDUCTION;  Surgeon: Jerrell Belfast, MD;  Location: [facility];  Service: ENT;  Laterality: Bilateral;  . ORIF FOREARM FRACTURE     right  . plastic surgery to face    . SINUS ENDO W/FUSION Bilateral 05/18/2018   Procedure: ENDOSCOPIC SINUS SURGERY WITH NAVIGATION;  Surgeon: Jerrell Belfast, MD;  Location: [facility];  Service: ENT;  Laterality: Bilateral;    Assessment / Plan / Recommendation Clinical Impression Patient is a 64 y.o. year old right-handed male with history of diabetes mellitus (a1c is 5.9) , chronic hyponatremia, hypertension, remote tobacco use, CLL/small B-cell lymphoma followed by Dr. Irene Limbo with last chemotherapy October 2020.  Per chart review patient lives with a 25 year old mother.  Independent prior to admission.  1 level home with ramped entrance.  Mother has an aide that assists her for a couple of hours daily Patient reports being on disability due to [place] in early 2000s. .  Presented 08/29/2019 with left-sided weakness and mild dysarthria.  Admission labs with hemoglobin 9.8, sodium 128, urine drug screen negative, SARS coronavirus negative, hemoglobin 9.8, hematocrit 28.8, platelet 310,000, WBC 4.3.  Cranial CT scan unremarkable.  Patient did not receive TPA.  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  CT angiogram of head and neck with no emergent large vessel occlusion or high-grade stenosis.  CT of chest abdomen pelvis showed improving lymphadenopathy in the chest abdomen pelvis.  Echocardiogram with ejection fraction 65% without emboli.  Neurology follow-up currently maintained on Plavix for CVA prophylaxis.  Patient was placed in [place] bayer  factor XI inhibitor stroke prevention trial.  Subcutaneous Lovenox for DVT prophylaxis.  Persistent hyponatremia 126-130 and initially maintained on normal saline.  Tolerating a regular consistency diet.  Therapy evaluations completed and patient was admitted for a comprehensive rehab program.  Patient transferred to CIR on 09/03/2019 .   Patient demonstrates mild cognitive impairments impacting recall of daily information and complex problem solving. Patient also demonstrates mild dysarthria due to decreased vocal intensity, however, patient remains 100% intelligible. Patient would benefit from skilled SLP intervention to maximize his cognitive functioning and overall functional independence prior to discharge.    Skilled Therapeutic Interventions          Patient administered a cognitive-linguistic evaluation, please see above for details.   SLP Assessment  Patient will need skilled [place] [facility] Services during CIR admission    Recommendations  Recommendations for Other Services: Neuropsych consult Patient destination: Home Follow up Recommendations: (TBD) Equipment Recommended: None recommended by SLP    SLP Frequency 3 to 5 out of 7 days   SLP Duration  SLP Intensity  SLP Treatment/Interventions 2-2.5 weeks  Minumum of 1-2 x/day, 30 to 90 minutes  Cognitive remediation/compensation;Internal/external aids;Cueing hierarchy;Environmental controls;Therapeutic Activities;Patient/family education;Functional tasks;Speech/Language facilitation    Pain 9/10 pain in head. RN aware and administered medications   Prior Functioning Type of Home: House  Lives With: Family Available Help at Discharge: Family Vocation: On disability  SLP Evaluation Cognition Overall Cognitive Status: Impaired/Different from baseline Arousal/Alertness: Lethargic Orientation Level: Oriented X4 Selective Attention: Appears intact Memory: Impaired Memory Impairment: Decreased short term  memory Decreased Short Term Memory: Functional basic;Verbal basic Awareness: Appears intact Problem Solving: Impaired Problem Solving Impairment: Functional basic Safety/Judgment: Appears intact  Comprehension Auditory Comprehension Overall Auditory Comprehension: Appears within functional limits for tasks assessed Visual Recognition/Discrimination Discrimination: Not tested Reading Comprehension Reading Status: Not tested Expression Expression Primary Mode of Expression: Verbal Verbal Expression Overall Verbal Expression: Appears within functional limits for tasks assessed Written Expression Dominant Hand: Right Written Expression: Not tested Oral Motor Oral Motor/Sensory Function Overall Oral Motor/Sensory Function: Within functional limits Motor Speech Overall Motor Speech: Impaired Respiration: Within functional limits Phonation: Normal Resonance: Within functional limits Articulation: Within functional limitis Level of Impairment: Conversation Intelligibility: Intelligible Motor Planning: Witnin functional limits Effective Techniques: Increased vocal intensity   Short Term Goals: Week 1: SLP Short Term Goal 1 (Week 1): Patient will demonstrate complex problem solving for functional and familiar tasks with supervision level verbal cues. SLP Short Term Goal 2 (Week 1): Patient will recall new, daily information with supervision level verbal cues for use of memory compensatory strategies.  Refer to Care Plan for Long Term Goals  Recommendations for other services: Neuropsych  Discharge Criteria: Patient will be discharged from SLP if patient refuses treatment 3 consecutive times without medical reason, if treatment goals not met, if there is a change in medical status, if patient makes no progress towards goals or if patient is discharged from hospital.  The above assessment, treatment plan, treatment alternatives and goals were discussed and mutually agreed upon: by  patient  Sedale Jenifer 09/04/2019, 3:53 PM

## 2019-09-04 NOTE — Progress Notes (Signed)
Patient information reviewed and entered into eRehab System by Becky Meili Kleckley, PPS coordinator. Information including medical coding, function ability, and quality indicators will be reviewed and updated through discharge.   

## 2019-09-04 NOTE — Patient Care Conference (Signed)
Inpatient RehabilitationTeam Conference and Plan of Care Update Date: 09/04/2019   Time: 11:35 AM    Patient Name: [name] [name]      Medical Record Number: [id number]  Date of Birth: 16-Feb-1956 Sex: Male         Room/Bed: 4W07C/4W07C-01 Payor Info: Payor: [employer] MEDICARE / Plan: [facility] MEDICARE / Product Type: *No Product type* /    Admit Date/Time:  09/03/2019  3:09 PM  Primary Diagnosis:  <principal problem not specified>  Patient Active Problem List   Diagnosis Date Noted  . Acute on chronic anemia   . Lymphopenia   . Hypoalbuminemia due to protein-calorie malnutrition ([place])   . Controlled type 2 diabetes mellitus with hyperglycemia, without long-term current use of insulin ([place])   . Right pontine CVA ([place]) 09/03/2019  . Acute ischemic stroke ([place]) 08/29/2019  . Hyponatremia 08/29/2019  . HTN (hypertension) 08/29/2019  . DM2 (diabetes mellitus, type 2) ([place]) 08/29/2019  . CLL (chronic lymphocytic leukemia) ([place]) 04/10/2019  . Counseling regarding advance care planning and goals of care 04/10/2019  . Deviated septum 05/18/2018  . Iron deficiency anemia 02/26/2018    Expected Discharge Date: Expected Discharge Date: (TBD)  Team Members Present: Physician leading conference: Dr. Delice Lesch Social Worker Present: Lennart Pall, LCSW Nurse Present: Dorien Chihuahua, RN;Victoria Gorham, LPN Case Manager: Karene Fry, RN PT Present: Becky Sax, PT OT Present: Simonne Come, OT SLP Present: Jettie Booze, CF-SLP PPS Coordinator present : Ileana Ladd, Burna Mortimer, SLP     Current Status/Progress Goal Weekly Team Focus  Bowel/Bladder   patient is currently  continent of B/B using urinal and bedpan  maintain continence  q shift and PRN toileting assistance   Swallow/Nutrition/ Hydration   eval pending         ADL's   eval pending         Mobility   eval pending         Communication   eval pending         Safety/Cognition/ Behavioral Observations  eval pending          Pain   Patient c/o left shoulder pain and left leg pain when moving  Tolerate or decrease pain during activity  q shift and PRN pain assessment   Skin   patient has no skin issue  maintain good skin condition  q shift assessment skin assessment    Rehab Goals Patient on target to meet rehab goals: Yes *See Care Plan and progress notes for long and short-term goals.     Barriers to Discharge  Current Status/Progress Possible Resolutions Date Resolved   Nursing                  PT  Lack of/limited family support  Patient's son can provide PRN assist at home only              OT Inaccessible home environment;Decreased caregiver support;Lack of/limited family support                SLP                SW Decreased caregiver support Son works 6p-6a, mother at patient's home has aide assistance a couple of hours a day Set to discharge home with son, ramped entrance to home          Discharge Planning/Teaching Needs:  Discharge home with son who can assist 6a-6p  TBD   Team Discussion: Chronic hyponatremia, monitoring labs, DM, monitoring  BP, WBC low.  RN cont B/B, L shou/arm pain, tylenol for headache today.  OT min/mod A, safety awareness issues, S goals.  PT eval pending.  SLP Reg, thins, mod A problem solving.  Home with son who works.   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 Weekly Focus/Goal: Improve mobility, leucopenia, acute on chronic anemia  Barriers to Discharge: Medical stability   Possible Resolutions to Barriers: Therapies, follow labs   Continued Need for Acute Rehabilitation Level of Care: The patient requires daily medical management by a physician with specialized training in physical medicine and rehabilitation for the following reasons: Direction of a multidisciplinary physical rehabilitation program to maximize  functional independence : Yes Medical management of patient stability for increased activity during participation in an intensive rehabilitation regime.: Yes Analysis of laboratory values and/or radiology reports with any subsequent need for medication adjustment and/or medical intervention. : Yes   I attest that I was present, lead the team conference, and concur with the assessment and plan of the team.   Retta Diones 09/04/2019, 8:50 PM   Team conference was held via web/ teleconference due to [place] - 19

## 2019-09-05 ENCOUNTER — Inpatient Hospital Stay (HOSPITAL_COMMUNITY): Payer: Medicare Other | Admitting: Occupational Therapy

## 2019-09-05 ENCOUNTER — Inpatient Hospital Stay (HOSPITAL_COMMUNITY): Payer: Medicare Other

## 2019-09-05 ENCOUNTER — Inpatient Hospital Stay (HOSPITAL_COMMUNITY): Payer: Medicare Other | Admitting: Speech Pathology

## 2019-09-05 LAB — CBC WITH DIFFERENTIAL/PLATELET
Abs Immature Granulocytes: 0 10*3/uL (ref 0.00–0.07)
Basophils Absolute: 0 10*3/uL (ref 0.0–0.1)
Basophils Relative: 1 %
Eosinophils Absolute: 0.4 10*3/uL (ref 0.0–0.5)
Eosinophils Relative: 10 %
HCT: 26.9 % — ABNORMAL LOW (ref 39.0–52.0)
Hemoglobin: 9.1 g/dL — ABNORMAL LOW (ref 13.0–17.0)
Lymphocytes Relative: 6 %
Lymphs Abs: 0.2 10*3/uL — ABNORMAL LOW (ref 0.7–4.0)
MCH: 24.2 pg — ABNORMAL LOW (ref 26.0–34.0)
MCHC: 33.8 g/dL (ref 30.0–36.0)
MCV: 71.5 fL — ABNORMAL LOW (ref 80.0–100.0)
Monocytes Absolute: 0.6 10*3/uL (ref 0.1–1.0)
Monocytes Relative: 17 %
Neutro Abs: 2.5 10*3/uL (ref 1.7–7.7)
Neutrophils Relative %: 66 %
Platelets: 353 10*3/uL (ref 150–400)
RBC: 3.76 MIL/uL — ABNORMAL LOW (ref 4.22–5.81)
RDW: 17.4 % — ABNORMAL HIGH (ref 11.5–15.5)
WBC: 3.8 10*3/uL — ABNORMAL LOW (ref 4.0–10.5)
nRBC: 0 % (ref 0.0–0.2)
nRBC: 0 /100 WBC

## 2019-09-05 LAB — GLUCOSE, CAPILLARY
Glucose-Capillary: 103 mg/dL — ABNORMAL HIGH (ref 70–99)
Glucose-Capillary: 116 mg/dL — ABNORMAL HIGH (ref 70–99)
Glucose-Capillary: 81 mg/dL (ref 70–99)
Glucose-Capillary: 126 mg/dL — ABNORMAL HIGH (ref 70–99)

## 2019-09-05 NOTE — Progress Notes (Signed)
Physical Therapy Session Note  Patient Details  Name: [name] [name] MRN: [id number] Date of Birth: 1955-09-01  Today's Date: 09/05/2019 PT Individual Time: [id number] PT Individual Time Calculation (min): 72 min   Short Term Goals: Week 1:  PT Short Term Goal 1 (Week 1): Patient will perform bed mobility with min A. PT Short Term Goal 2 (Week 1): Patient will perform basic transfers with min A using LRAD. PT Short Term Goal 3 (Week 1): Patient will ambulate >20 feet using LRAD. PT Short Term Goal 4 (Week 1): Patient will propel w/c >50 feet using R hemi technique.  Skilled Therapeutic Interventions/Progress Updates:   Received pt supine in bed, pt agreeable to therapy, and denied pain throughout session. Session focused on functional mobility/transfers, LE strength, LUE/LE NMR, LE strength, balance/coordination/motor planning, and improved endurance with activity. Therapist provided pt with 18x18 WC and cushion for increased comfort. Pt performed bed mobility with min A with HOB elevated and use of bedrails. Pt transferred stand<>pivot to R with/without AD x 3 trials throughout session mod A. Therapist donned shoes max A for time management and pt transported to gym in [facility]. In standing with bilateral UE support on RW, pt attempted to advance LLE towards colored dot target on floor min A for balance, however was only able to complete 2 trials due to increased difficulty advancing LLE. Pt performed standing lateral weight shifts with bilateral UE support on RW min/mod A with verbal and tactile cues for L weight shifting and to extend L knee using mirror for visual feedback 3x10. Pt performed standing mini squats 3x5 with bilateral UE support on RW mod A with verbal and tactile cues for weight shifting onto LLE. Pt with increased difficulty and exertion requiring frequent rest breaks throughout session. Pt performed seated weight shifts onto L elbow mod A for stability/control 2x5. Pt required verbal and  manual facilitation to push with R hand to extend elbow. Concluded session with pt sitting in WC, needs within reach, and seatbelt alarm on.    Therapy Documentation Precautions:  Precautions Precautions: Fall Precaution Comments: L hemiparesis Restrictions Weight Bearing Restrictions: No   Therapy/Group: Individual Therapy Alfonse Alpers PT, DPT   09/05/2019, 7:49 AM

## 2019-09-05 NOTE — Progress Notes (Signed)
Occupational Therapy Session Note  Patient Details  Name: [name] [name] MRN: [id number] Date of Birth: 15-Dec-1955  Today's Date: 09/05/2019 OT Individual Time: 1400-1500 OT Individual Time Calculation (min): 60 min    Short Term Goals: Week 1:  OT Short Term Goal 1 (Week 1): Pt will recall hemi dressing techniques wiht no VC to don shirt OT Short Term Goal 2 (Week 1): Pt will transfer to toilet with MIN A and LRAD OT Short Term Goal 3 (Week 1): Pt will manage LUE prior o transfers to demo improved L attention  Skilled Therapeutic Interventions/Progress Updates:    Patient seated in w/c, pleasant and cooperative.  He requests a shower this afternoon.  SPT w/c to/from shower bench with modA grab bar at right hand.  Bathing completed with mod A overall for buttocks, right UE and bilateral lower legs.  Dressing completed seated in w/c - UB mod A, LB max A, slipper socks max A, CM in stance with max A.  Provided lap tray for support of left UE, provided eye patch for use during dynamic activities that require scanning to the right or with fatigue.  He is able to sustain a single image with adl activities during this session, he is able to use his phone placed on tray toward right side - deficit noted with left eye motion on horizontal plane to right.  Completed AAROM scapula, shoulder and elbow - no volitional movement noted wrist and hand.  He remained in the w/c at close of session with seat belt alarm set and call bell in hand.    Therapy Documentation Precautions:  Precautions Precautions: Fall Precaution Comments: L hemiparesis Restrictions Weight Bearing Restrictions: No General:   Vital Signs: Therapy Vitals Temp: 98.3 F (36.8 C) Pulse Rate: 73 Resp: 14 BP: 125/65 Patient Position (if appropriate): Sitting Oxygen Therapy SpO2: 99 % O2 Device: Room Air Pain: Pain Assessment Pain Scale: 0-10 Pain Score: 0-No pain Other Treatments:     Therapy/Group: Individual  Therapy  Carlos Levering 09/05/2019, 4:00 PM

## 2019-09-05 NOTE — Progress Notes (Signed)
Speech Language Pathology Daily Session Note  Patient Details  Name: [name] [name] MRN: [id number] Date of Birth: 03/04/1956  Today's Date: 09/05/2019 SLP Individual Time: [id number] SLP Individual Time Calculation (min): 54 min  Short Term Goals: Week 1: SLP Short Term Goal 1 (Week 1): Patient will demonstrate complex problem solving for functional and familiar tasks with supervision level verbal cues. SLP Short Term Goal 2 (Week 1): Patient will recall new, daily information with supervision level verbal cues for use of memory compensatory strategies.  Skilled Therapeutic Interventions: Pt was seen for skilled ST targeting cognitive goals. Upon entrance to room, pt reported lack of sleep overnight resulting in fatigue, however was willing to participate. SLP facilitated session with Mod A verbal and visual cues for organization, planning, and problem solving during a complex deductive reasoning scheduling worksheet ([name] [name]'s Schedule). Pt did require extra time to complete task due to lethargy, which pt reported made it difficult for him to focus on task, particularly given complexity. In functional conversation regarding medications, pt unable to generate names independently, however he did recall 2 familiar medication functions (took prior to admission) when provided with the name. Pt also recalled 4 specific activities from PT evaluation with Supervision A level question cues from SLP. Pt left laying in bed with alarm set and needs within reach. Continue per current plan of care.       Pain Pain Assessment Pain Scale: 0-10 Pain Score: 0-No pain  Therapy/Group: Individual Therapy  Arbutus Leas 09/05/2019, 9:26 AM

## 2019-09-05 NOTE — Progress Notes (Signed)
[place] PHYSICAL MEDICINE & REHABILITATION PROGRESS NOTE  Subjective/Complaints: Patient seen sitting up in bed this morning. He states he slept fairly well overnight but was woken early for blood draw. Denies pain or constipation.  ROS: + Left shoulder pain.  Denies CP, shortness of breath, nausea, vomiting, diarrhea.  Objective: Vital Signs: Blood pressure 134/75, pulse 68, temperature 98.5 F (36.9 C), resp. rate 17, weight 73.8 kg, SpO2 96 %. No results found. Recent Labs    09/04/19 0519 09/05/19 0513  WBC 3.5* 3.8*  HGB 8.6* 9.1*  HCT 25.2* 26.9*  PLT 330 353   Recent Labs    09/03/19 0148 09/04/19 0519  NA 126* 128*  K 3.6 3.7  CL 97* 96*  CO2 20* 21*  GLUCOSE 104* 109*  BUN 6* 7*  CREATININE 0.80 0.97  CALCIUM 8.6* 9.2    Physical Exam: BP 134/75   Pulse 68   Temp 98.5 F (36.9 C)   Resp 17   Wt 73.8 kg   SpO2 96%   BMI 25.48 kg/m  Constitutional: No distress . Vital signs reviewed. HENT: Normocephalic.  Atraumatic. Eyes: EOMI. No discharge. Cardiovascular: No JVD. Respiratory: Normal effort.  No stridor. GI: Non-distended. Skin: Warm and dry.  Intact. Psych: Normal mood.  Normal behavior. Musc: Left shoulder TTP Neuro: Alert and oriented Motor: RUE/RLE: 5/5 proximal distal LUE: 0/5 proximal distal LLE: Flexion, knee extension 1+/5, ankle dorsiflexion 0/5 No increase in tone appreciated Left lower extremity clonus  Assessment/Plan: 1. Functional deficits secondary to right paramedian pontine infarct which require 3+ hours per day of interdisciplinary therapy in a comprehensive inpatient rehab setting.  Physiatrist is providing close team supervision and 24 hour management of active medical problems listed below.  Physiatrist and rehab team continue to assess barriers to discharge/monitor patient progress toward functional and medical goals  Care Tool:  Bathing  Bathing activity did not occur: Safety/medical concerns Body parts bathed  by patient: Left arm, Chest, Abdomen, Front perineal area, Right upper leg, Left upper leg, Right lower leg, Left lower leg, Face   Body parts bathed by helper: Right arm, Buttocks, Left lower leg     Bathing assist Assist Level: Moderate Assistance - Patient 50 - 74%     Upper Body Dressing/Undressing Upper body dressing   What is the patient wearing?: Hospital gown only    Upper body assist Assist Level: Moderate Assistance - Patient 50 - 74%    Lower Body Dressing/Undressing Lower body dressing      What is the patient wearing?: Hospital gown only     Lower body assist Assist for lower body dressing: Maximal Assistance - Patient 25 - 49%     Toileting Toileting Toileting Activity did not occur (Clothing management and hygiene only): N/A (no void or bm)  Toileting assist Assist for toileting: Independent with assistive device Assistive Device Comment: (urinal)   Transfers Chair/bed transfer  Transfers assist  Chair/bed transfer activity did not occur: Safety/medical concerns  Chair/bed transfer assist level: Moderate Assistance - Patient 50 - 74%     Locomotion Ambulation   Ambulation assist      Assist level: Maximal Assistance - Patient 25 - 49% Assistive device: Hand held assist Max distance: 4'   Walk 10 feet activity   Assist  Walk 10 feet activity did not occur: Safety/medical concerns(decreased strength/activity tolerance)        Walk 50 feet activity   Assist Walk 50 feet with 2 turns activity did not occur: Safety/medical  concerns(decreased strength/activity tolerance)         Walk 150 feet activity   Assist Walk 150 feet activity did not occur: Safety/medical concerns(decreased strength/activity tolerance)         Walk 10 feet on uneven surface  activity   Assist Walk 10 feet on uneven surfaces activity did not occur: Safety/medical concerns(decreased strength/activity tolerance)         Wheelchair     Assist    Type of Wheelchair: Manual Wheelchair activity did not occur: Safety/medical concerns(Unable without skilled intervention, L hemi)         Wheelchair 50 feet with 2 turns activity    Assist    Wheelchair 50 feet with 2 turns activity did not occur: Safety/medical concerns(Unable without skilled intervention, L hemi)       Wheelchair 150 feet activity     Assist Wheelchair 150 feet activity did not occur: Safety/medical concerns(Unable without skilled intervention, L hemi)          Medical Problem List and Plan: 1.  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  Antithrombotics: -DVT/anticoagulation: Lovenox             -antiplatelet therapy: Plavix 75 mg daily patient placed in [employer] factor XI inhibitor stroke prevention trial 3. Pain Management: Tylenol as needed 4. Mood: Provide emotional support             -antipsychotic agents: N/A 5. Neuropsych: This patient is capable of making decisions on his own behalf. 6. Skin/Wound Care: Routine skin checks 7. Fluids/Electrolytes/Nutrition: Routine in and outs 8.  Chronic hyponatremia.  Low sodium in September through November 2020.    IVFs 100cc/hr d/ced  Na 128 on 1/13 9.  CLL/small B-cell lymphoma.  Follow-up Dr.Kale.  Patient last received chemotherapy October 2020. 10.  Type 2 diabetes mellitus with hyperglycemia.  Hemoglobin A1c 5.9.  Glucophage currently held.  Continue sliding scale  Monitor with increased mobility 11.  Hyperlipidemia.  Lipitor 12.  Essential hypertension.    Monitor with increased mobility 13. Mild Spasticity:  Cont to monitor 14. OSA: refuses to wear CPAP 15. Hypoalbuminemia  Supplement initiated on 1/13 16. Leukopenia  WBCs 3.5 on 1/13  WBC improved to 3.8 on  1/14.   See #9  Cont to monitor 17. Acute on chronic anemia  Hb 8.6 on 1/13  1/14: hgb improved to 9.1   LOS: 2 days A FACE TO FACE EVALUATION WAS PERFORMED  Martha Clan P Ieshia Hatcher 09/05/2019, 10:39 AM

## 2019-09-06 ENCOUNTER — Inpatient Hospital Stay (HOSPITAL_COMMUNITY): Payer: Medicare Other

## 2019-09-06 ENCOUNTER — Inpatient Hospital Stay (HOSPITAL_COMMUNITY): Payer: Medicare Other | Admitting: Occupational Therapy

## 2019-09-06 ENCOUNTER — Inpatient Hospital Stay (HOSPITAL_COMMUNITY): Payer: Medicare Other | Admitting: Speech Pathology

## 2019-09-06 DIAGNOSIS — I10 Essential (primary) hypertension: Secondary | ICD-10-CM

## 2019-09-06 DIAGNOSIS — R7309 Other abnormal glucose: Secondary | ICD-10-CM

## 2019-09-06 LAB — GLUCOSE, CAPILLARY
Glucose-Capillary: 111 mg/dL — ABNORMAL HIGH (ref 70–99)
Glucose-Capillary: 97 mg/dL (ref 70–99)
Glucose-Capillary: 103 mg/dL — ABNORMAL HIGH (ref 70–99)
Glucose-Capillary: 144 mg/dL — ABNORMAL HIGH (ref 70–99)

## 2019-09-06 NOTE — Progress Notes (Signed)
Speech Language Pathology Daily Session Note  Patient Details  Name: [name] [name] MRN: [id number] Date of Birth: 01/31/1956  Today's Date: 09/06/2019 SLP Individual Time: 1345-1430 SLP Individual Time Calculation (min): 45 min  Short Term Goals: Week 1: SLP Short Term Goal 1 (Week 1): Patient will demonstrate complex problem solving for functional and familiar tasks with supervision level verbal cues. SLP Short Term Goal 2 (Week 1): Patient will recall new, daily information with supervision level verbal cues for use of memory compensatory strategies.  Skilled Therapeutic Interventions:  Skilled treatment session targeted cognition goals. SLP facilitated session by providing Min A cues for completion of semi-complex daily scheduling task. Specifically, pt required cues to organize information and apply to time slots. He demonstrate emergent awareness during task as evidenced by self-correcting x 2. Pt left upright in wheelchair, lap belt alarm on and all needs within reach. Continue per current plan of care.      Pain    Therapy/Group: Individual Therapy  David Rodriquez 09/06/2019, 2:55 PM

## 2019-09-06 NOTE — Progress Notes (Signed)
Physical Therapy Session Note  Patient Details  Name: [name] [name] MRN: [id number] Date of Birth: 06/15/1956  Today's Date: 09/06/2019 PT Individual Time: [id number] PT Individual Time Calculation (min): 60 min   Short Term Goals: Week 1:  PT Short Term Goal 1 (Week 1): Patient will perform bed mobility with min A. PT Short Term Goal 2 (Week 1): Patient will perform basic transfers with min A using LRAD. PT Short Term Goal 3 (Week 1): Patient will ambulate >20 feet using LRAD. PT Short Term Goal 4 (Week 1): Patient will propel w/c >50 feet using R hemi technique.  Skilled Therapeutic Interventions/Progress Updates:   Received pt sitting in WC, pt agreeable to therapy, and denied any pain throughout session. Session focused on functional mobility/transfers, LE strength, R LE NMR, balance/coordination/motor planning, and improved activity tolerance. Pt performed WC mobility 15ft using L hemi technique CGA and verbal cues for technique. Pt transferred stand<>pivot WC<>mat with RW mod A. Pt performed sit<>stand 2x5 with bilateral UE support on RW min A with tactile cues blocking LLE form hyperextension. Pt performed standing RLE toe taps to colored target on floor 2x5 with bilateral UE support on RW and tactile cues to prevent L LE hyperextension with focus on weight shifting onto LLE. Pt transferred sit<>supine min A for L UE/LE management. Pt performed supine bridges 3x5 with tactile cues for LLE knee control. Pt practiced actively maintaining hookyling position focusing on motor control of LLE. In R sidelying pt performed LLE active assisted hip flexion/extension 2x5 with verbal cues for technique. Pt transferred R sidelying<>supine<>L sidelying<>sit with mod A. Pt transferred stand<>pivot with RW to L heavy mod A. Concluded session with pt sitting in WC, needs within reach, and seatbelt alarm on. Lap tray and pillow provided for positioning.   Therapy Documentation Precautions:   Precautions Precautions: Fall Precaution Comments: L hemiparesis Restrictions Weight Bearing Restrictions: No   Therapy/Group: Individual Therapy Alfonse Alpers PT, DPT   09/06/2019, 7:35 AM

## 2019-09-06 NOTE — Progress Notes (Signed)
Occupational Therapy Session Note  Patient Details  Name: [name] [name] MRN: [id number] Date of Birth: 03-24-1956  Today's Date: 09/06/2019 OT Individual Time: [id number] OT Individual Time Calculation (min): 75 min    Short Term Goals: Week 1:  OT Short Term Goal 1 (Week 1): Pt will recall hemi dressing techniques wiht no VC to don shirt OT Short Term Goal 2 (Week 1): Pt will transfer to toilet with MIN A and LRAD OT Short Term Goal 3 (Week 1): Pt will manage LUE prior o transfers to demo improved L attention  Skilled Therapeutic Interventions/Progress Updates:    Treatment session with focus on sit > stand, functional transfers, and LUE NMR during self-care retraining.  Pt received in bed stating "isn't it early?" when therapist arrived.  Reiterated schedule and focus of therapy sessions with pt agreeable to therapy.  Pt stating he has not been sleeping well overnight due to a lot of commotion.  Pt completed bed mobility and squat pivot transfer bed > w/c with mod assist.  Pt transferred stand pivot w/c > toilet with focus on weight shifting and advancement of LLE during transfer.  Pt +BM on commode, requiring +2 for hygiene in standing.  Mod assist transfer toilet > w/c > shower seat.  Engaged in [place] to facilitate incorporation of LUE into bathing tasks.  Pt completed sit > stand with min assist and maintained static standing balance with UE support to allow therapist to wash buttocks.  Engaged in dressing at sit > stand level at sink with education on hemi-dressing technique.  Pt remained upright in w/c with all needs in reach and seat belt alarm on.  Therapy Documentation Precautions:  Precautions Precautions: Fall Precaution Comments: L hemiparesis Restrictions Weight Bearing Restrictions: No Pain:  Pt with c/o pain in LUE.  Nerve pain.  RN provided meds during session.   Therapy/Group: Individual Therapy  Simonne Come 09/06/2019, 10:27 AM

## 2019-09-06 NOTE — Progress Notes (Signed)
[place] PHYSICAL MEDICINE & REHABILITATION PROGRESS NOTE  Subjective/Complaints: Patient seen sitting up this AM, working with therapies.  He states he slept fairly overnight due to people coming out of his room.  ROS: Denies CP, shortness of breath, nausea, vomiting, diarrhea.  Objective: Vital Signs: Blood pressure 138/67, pulse 67, temperature 98.6 F (37 C), resp. rate 17, weight 73.8 kg, SpO2 98 %. No results found. Recent Labs    09/04/19 0519 09/05/19 0513  WBC 3.5* 3.8*  HGB 8.6* 9.1*  HCT 25.2* 26.9*  PLT 330 353   Recent Labs    09/04/19 0519  NA 128*  K 3.7  CL 96*  CO2 21*  GLUCOSE 109*  BUN 7*  CREATININE 0.97  CALCIUM 9.2    Physical Exam: BP 138/67   Pulse 67   Temp 98.6 F (37 C)   Resp 17   Wt 73.8 kg   SpO2 98%   BMI 25.48 kg/m   Constitutional: No distress . Vital signs reviewed. HENT: Normocephalic.  Atraumatic. Eyes: EOMI. No discharge. Cardiovascular: No JVD. Respiratory: Normal effort.  No stridor. GI: Non-distended. Skin: Warm and dry.  Intact. Psych: Normal mood.  Normal behavior. Musc: No edema in extremities.  No tenderness in extremities. Neuro: Alert  Motor: RUE/RLE: 5/5 proximal distal, unchanged LUE: 0/5 proximal distal LLE: Flexion, knee extension 1+/5, ankle dorsiflexion 0/5 No increase in tone appreciated  Assessment/Plan: 1. Functional deficits secondary to right paramedian pontine infarct which require 3+ hours per day of interdisciplinary therapy in a comprehensive inpatient rehab setting.  Physiatrist is providing close team supervision and 24 hour management of active medical problems listed below.  Physiatrist and rehab team continue to assess barriers to discharge/monitor patient progress toward functional and medical goals  Care Tool:  Bathing    Body parts bathed by patient: Left arm, Chest, Abdomen, Front perineal area, Right upper leg, Left upper leg, Right lower leg, Left lower leg, Face   Body  parts bathed by helper: Right arm, Buttocks, Left lower leg, Right lower leg     Bathing assist Assist Level: Moderate Assistance - Patient 50 - 74%     Upper Body Dressing/Undressing Upper body dressing   What is the patient wearing?: Pull over shirt    Upper body assist Assist Level: Moderate Assistance - Patient 50 - 74%    Lower Body Dressing/Undressing Lower body dressing      What is the patient wearing?: Pants, Incontinence brief     Lower body assist Assist for lower body dressing: Maximal Assistance - Patient 25 - 49%     Toileting Toileting Toileting Activity did not occur (Clothing management and hygiene only): N/A (no void or bm)  Toileting assist Assist for toileting: Independent with assistive device Assistive Device Comment: urinal   Transfers Chair/bed transfer  Transfers assist  Chair/bed transfer activity did not occur: Safety/medical concerns  Chair/bed transfer assist level: Moderate Assistance - Patient 50 - 74%     Locomotion Ambulation   Ambulation assist      Assist level: Maximal Assistance - Patient 25 - 49% Assistive device: Hand held assist Max distance: 4'   Walk 10 feet activity   Assist  Walk 10 feet activity did not occur: Safety/medical concerns(decreased strength/activity tolerance)        Walk 50 feet activity   Assist Walk 50 feet with 2 turns activity did not occur: Safety/medical concerns(decreased strength/activity tolerance)         Walk 150 feet activity  Assist Walk 150 feet activity did not occur: Safety/medical concerns(decreased strength/activity tolerance)         Walk 10 feet on uneven surface  activity   Assist Walk 10 feet on uneven surfaces activity did not occur: Safety/medical concerns(decreased strength/activity tolerance)         Wheelchair     Assist   Type of Wheelchair: Manual Wheelchair activity did not occur: Safety/medical concerns(Unable without skilled  intervention, L hemi)         Wheelchair 50 feet with 2 turns activity    Assist    Wheelchair 50 feet with 2 turns activity did not occur: (Unable without skilled intervention, L hemi)   Assist Level: Dependent - Patient 0%   Wheelchair 150 feet activity     Assist Wheelchair 150 feet activity did not occur: (Unable without skilled intervention, L hemi)   Assist Level: Dependent - Patient 0%      Medical Problem List and Plan: 1.  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  Antithrombotics: -DVT/anticoagulation: Lovenox             -antiplatelet therapy: Plavix 75 mg daily patient placed in [employer] factor XI inhibitor stroke prevention trial 3. Pain Management: Tylenol as needed 4. Mood: Provide emotional support             -antipsychotic agents: N/A 5. Neuropsych: This patient is capable of making decisions on his own behalf. 6. Skin/Wound Care: Routine skin checks 7. Fluids/Electrolytes/Nutrition: Routine in and outs 8.  Chronic hyponatremia.  Low sodium in September through November 2020.    IVFs 100cc/hr d/ced  Na 128 on 1/13 9.  CLL/small B-cell lymphoma.  Follow-up Dr.Kale.  Patient last received chemotherapy October 2020. 10.  Type 2 diabetes mellitus with hyperglycemia.  Hemoglobin A1c 5.9.  Glucophage currently held.  Continue sliding scale  Slightly labile on 1/15, monitor for trend  Monitor with increased mobility 11.  Hyperlipidemia.  Lipitor 12.  Essential hypertension.    Controlled on 1/15  Monitor with increased mobility 13. Mild Spasticity:  Cont to monitor 14. OSA: refuses to wear CPAP 15. Hypoalbuminemia  Supplement initiated on 1/13 16. Leukopenia  WBCs 3.8 on 1/14, labs ordered for Monday  See #9  Cont to monitor 17. Acute on chronic anemia  Hb 9.1 on 1/14, labs ordered for Monday  LOS: 3 days A FACE TO FACE EVALUATION WAS  PERFORMED  Ankit Lorie Phenix 09/06/2019, 9:05 AM

## 2019-09-07 ENCOUNTER — Inpatient Hospital Stay (HOSPITAL_COMMUNITY): Payer: Medicare Other | Admitting: Speech Pathology

## 2019-09-07 ENCOUNTER — Encounter (HOSPITAL_COMMUNITY): Payer: Self-pay | Admitting: Physical Medicine & Rehabilitation

## 2019-09-07 ENCOUNTER — Inpatient Hospital Stay (HOSPITAL_COMMUNITY): Payer: Medicare Other

## 2019-09-07 ENCOUNTER — Inpatient Hospital Stay (HOSPITAL_COMMUNITY): Payer: Medicare Other | Admitting: Occupational Therapy

## 2019-09-07 LAB — GLUCOSE, CAPILLARY
Glucose-Capillary: 112 mg/dL — ABNORMAL HIGH (ref 70–99)
Glucose-Capillary: 100 mg/dL — ABNORMAL HIGH (ref 70–99)
Glucose-Capillary: 110 mg/dL — ABNORMAL HIGH (ref 70–99)
Glucose-Capillary: 130 mg/dL — ABNORMAL HIGH (ref 70–99)

## 2019-09-07 MED ORDER — MUSCLE RUB 10-15 % EX CREA
TOPICAL_CREAM | CUTANEOUS | Status: DC | PRN
  Administered 2019-09-08: 1 via TOPICAL
  Filled 2019-09-07: qty 85

## 2019-09-07 NOTE — Progress Notes (Signed)
Physical Therapy Session Note  Patient Details  Name: [name] [name] MRN: [id number] Date of Birth: 1956-06-06  Today's Date: 09/07/2019 PT Individual Time: 0905-1002 PT Individual Time Calculation (min): 57 min   Short Term Goals: Week 1:  PT Short Term Goal 1 (Week 1): Patient will perform bed mobility with min A. PT Short Term Goal 2 (Week 1): Patient will perform basic transfers with min A using LRAD. PT Short Term Goal 3 (Week 1): Patient will ambulate >20 feet using LRAD. PT Short Term Goal 4 (Week 1): Patient will propel w/c >50 feet using R hemi technique.  Skilled Therapeutic Interventions/Progress Updates:  Pt resting in bed.  He denied pain.    Bed mobility training in nearly flat bed, no rails, for rolling R/L , with min assist and tactile cues after set up.  PT donned brief with pt rolling L and R with assistance.   gentle PROM L hip, knee and ankle.  PT educated pt about moving more slowly, allowing LLE to activate in side lying, especially.  Also educated pt on deep breathing and head/hips relationship throughout session.  neuromuscular re-education via demo and multimodal cues for supine- bil bridging, bil lower trunk rotation, R mass extension from mass passive flexed position.  In R side lying: bil hip flexion/extension, L hip flexion/extension, L hip abduction with flexed hip and knee, L knee extension/flexion.  Pt had intermittent L hip extension emerging , and consistent L hip flexion/extension in R side lying.  PT threaded pt's jeans on feet with pt in side lying.  He assisted in pulling them up, continuing in supine via bridging.  r side lying> sitting with max cues for technique: bil hip flexion, bil knee extension to move feet off of bed, push up with RUE, min assist.  PT buckled belt with pt in sitting.  Squat pivot transfer to R, min/mod assist.  At end of session, pt in w/c with seat belt alarm set and needs at hand.  Full lap tray attached to w/c.       Therapy Documentation Precautions:  Precautions Precautions: Fall Precaution Comments: L hemiparesis Restrictions Weight Bearing Restrictions: No         Therapy/Group: Individual Therapy  Ranessa Kosta 09/07/2019, 10:13 AM

## 2019-09-07 NOTE — Progress Notes (Signed)
Called Orthopedic Tech Progress Note Patient Details:  [name] [name] [birth year] [id number]  Patient ID: [name], male   DOB: 1955/10/07, 64 y.o.   MRN: [id number]   [place] Pink 09/07/2019, 11:50 AMCalled hanger for brace.

## 2019-09-07 NOTE — Progress Notes (Signed)
Speech Language Pathology Daily Session Note  Patient Details  Name: [name] [name] MRN: [id number] Date of Birth: [birth year]  Today's Date: 09/07/2019 SLP Individual Time: 1035-1130 SLP Individual Time Calculation (min): 55 min  Short Term Goals: Week 1: SLP Short Term Goal 1 (Week 1): Patient will demonstrate complex problem solving for functional and familiar tasks with supervision level verbal cues. SLP Short Term Goal 2 (Week 1): Patient will recall new, daily information with supervision level verbal cues for use of memory compensatory strategies.  Skilled Therapeutic Interventions:  Pt was seen for skilled ST targeting cognitive goals.  Pt was upright in wheelchair requesting to use the bathroom upon therapist's arrival.  Pt was able to void into the urinal using the [place] and assistance from the therapist.  No overt safety concerns were noted during standing, sitting, or when using the urinal.  Pt reports that he feels his memory is "pretty good" and back to baseline.  Pt was able to recall activities from previous therapy sessions with mod I.  Pt reports that he was a very organized person at baseline and he tended to do things in a very methodical and routine way.  Therapist provided skilled education regarding memory compensatory strategies and provided pt with a handout to maximize carryover in between therapy sessions.  SLP drew special attention to strategies pt indicated that he had used prior to admission.  SLP offered to work with pt on loading a pill box given recent changes in medication regimen; however, pt politely declined stating that he had a system for managing his medications and he was worried that learning a new way would be too confusing.  Pt was provided with a notepad and oriented to the unit's health resource binder as tools he can use to keep up with new information.  Pt was left in chair with chair alarm set and call bell within reach.  Continue per current plan of care.     Pain Pain Assessment Pain Scale: 0-10 Pain Score: 0-No pain  Therapy/Group: Individual Therapy  Syniah Berne, Selinda Orion 09/07/2019, 2:50 PM

## 2019-09-07 NOTE — Progress Notes (Addendum)
[place] PHYSICAL MEDICINE & REHABILITATION PROGRESS NOTE  Subjective/Complaints: Patient seen sitting up in bed this morning.  He states he slept well overnight.  He notes improvement in right shoulder pain.  ROS: Denies CP, shortness of breath, nausea, vomiting, diarrhea.  Objective: Vital Signs: Blood pressure 128/70, pulse 69, temperature 98.6 F (37 C), temperature source Oral, resp. rate 18, weight 73.8 kg, SpO2 97 %. No results found. Recent Labs    09/05/19 0513  WBC 3.8*  HGB 9.1*  HCT 26.9*  PLT 353   No results for input(s): NA, K, CL, CO2, GLUCOSE, BUN, CREATININE, CALCIUM in the last 72 hours.  Physical Exam: BP 128/70 (BP Location: Right Arm)   Pulse 69   Temp 98.6 F (37 C) (Oral)   Resp 18   Wt 73.8 kg   SpO2 97%   BMI 25.48 kg/m   Constitutional: No distress . Vital signs reviewed. HENT: Normocephalic.  Atraumatic. Eyes: EOMI. No discharge. Cardiovascular: No JVD. Respiratory: Normal effort.  No stridor. GI: Non-distended. Skin: Warm and dry.  Intact. Psych: Normal mood.  Normal behavior. Musc: No edema in extremities.  No tenderness in extremities. Neuro: Alert Motor:  LUE: 0/5 proximal distal, unchanged LLE: Flexion, knee extension 1+/5, ankle dorsiflexion 0/5, unchanged  Assessment/Plan: 1. Functional deficits secondary to right paramedian pontine infarct which require 3+ hours per day of interdisciplinary therapy in a comprehensive inpatient rehab setting.  Physiatrist is providing close team supervision and 24 hour management of active medical problems listed below.  Physiatrist and rehab team continue to assess barriers to discharge/monitor patient progress toward functional and medical goals  Care Tool:  Bathing    Body parts bathed by patient: Left arm, Chest, Abdomen, Front perineal area, Right upper leg, Left upper leg, Right lower leg, Left lower leg, Face   Body parts bathed by helper: Right arm, Buttocks     Bathing assist  Assist Level: Moderate Assistance - Patient 50 - 74%     Upper Body Dressing/Undressing Upper body dressing   What is the patient wearing?: Pull over shirt    Upper body assist Assist Level: Moderate Assistance - Patient 50 - 74%    Lower Body Dressing/Undressing Lower body dressing      What is the patient wearing?: Incontinence brief, Pants     Lower body assist Assist for lower body dressing: Maximal Assistance - Patient 25 - 49%     Toileting Toileting Toileting Activity did not occur (Clothing management and hygiene only): N/A (no void or bm)  Toileting assist Assist for toileting: Minimal Assistance - Patient > 75% Assistive Device Comment: urinal   Transfers Chair/bed transfer  Transfers assist  Chair/bed transfer activity did not occur: Safety/medical concerns  Chair/bed transfer assist level: Moderate Assistance - Patient 50 - 74%     Locomotion Ambulation   Ambulation assist      Assist level: Maximal Assistance - Patient 25 - 49% Assistive device: Hand held assist Max distance: 4'   Walk 10 feet activity   Assist  Walk 10 feet activity did not occur: Safety/medical concerns(decreased strength/activity tolerance)        Walk 50 feet activity   Assist Walk 50 feet with 2 turns activity did not occur: Safety/medical concerns(decreased strength/activity tolerance)         Walk 150 feet activity   Assist Walk 150 feet activity did not occur: Safety/medical concerns(decreased strength/activity tolerance)         Walk 10 feet on uneven surface  activity   Assist Walk 10 feet on uneven surfaces activity did not occur: Safety/medical concerns(decreased strength/activity tolerance)         Wheelchair     Assist Will patient use wheelchair at discharge?: Yes Type of Wheelchair: Manual Wheelchair activity did not occur: Safety/medical concerns(Unable without skilled intervention, L hemi)  Wheelchair assist level: Contact  Guard/Touching assist Max wheelchair distance: 30ft    Wheelchair 50 feet with 2 turns activity    Assist    Wheelchair 50 feet with 2 turns activity did not occur: (Unable without skilled intervention, L hemi)   Assist Level: Contact Guard/Touching assist   Wheelchair 150 feet activity     Assist Wheelchair 150 feet activity did not occur: (Unable without skilled intervention, L hemi)   Assist Level: Dependent - Patient 0%      Medical Problem List and Plan: 1.  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  Antithrombotics: -DVT/anticoagulation: Lovenox             -antiplatelet therapy: Plavix 75 mg daily patient placed in [employer] factor XI inhibitor stroke prevention trial 3. Pain Management: Tylenol as needed 4. Mood: Provide emotional support             -antipsychotic agents: N/A 5. Neuropsych: This patient is capable of making decisions on his own behalf. 6. Skin/Wound Care: Routine skin checks 7. Fluids/Electrolytes/Nutrition: Routine in and outs 8.  Chronic hyponatremia.  Low sodium in September through November 2020.    IVFs 100cc/hr d/ced  Na 128 on 1/13, labs ordered for Monday 9.  CLL/small B-cell lymphoma.  Follow-up Dr.Kale.  Patient last received chemotherapy October 2020. 10.  Type 2 diabetes mellitus with hyperglycemia.  Hemoglobin A1c 5.9.  Glucophage currently held.  Continue sliding scale  Slightly labile on 1/16, monitor for trend  Monitor with increased mobility 11.  Hyperlipidemia.  Lipitor 12.  Essential hypertension.    Controlled on 1/16  Monitor with increased mobility 13. Mild Spasticity:  Cont to monitor 14. OSA: refuses to wear CPAP 15. Hypoalbuminemia  Supplement initiated on 1/13 16. Leukopenia  WBCs 3.8 on 1/14, labs ordered for Monday  See #9  Cont to monitor 17. Acute on chronic anemia  Hb 9.1 on 1/14, labs ordered  for Monday  Addendum: Discussed with neurology, changed Voltaren gel to muscle rub.  LOS: 4 days A FACE TO FACE EVALUATION WAS PERFORMED  Yena Tisby Lorie Phenix 09/07/2019, 10:24 AM

## 2019-09-07 NOTE — Progress Notes (Signed)
Occupational Therapy Session Note  Patient Details  Name: [name] [name] MRN: [id number] Date of Birth: 12-27-1955  Today's Date: 09/07/2019 OT Individual Time: 1405-1505 OT Individual Time Calculation (min): 60 min    Short Term Goals: Week 1:  OT Short Term Goal 1 (Week 1): Pt will recall hemi dressing techniques wiht no VC to don shirt OT Short Term Goal 2 (Week 1): Pt will transfer to toilet with MIN A and LRAD OT Short Term Goal 3 (Week 1): Pt will manage LUE prior o transfers to demo improved L attention  Skilled Therapeutic Interventions/Progress Updates:    Treatment session with focus on LUE NMR, functional transfers, and standing balance.  Pt received upright in w/c expressing desire to work on LUE.  Completed stand pivot transfers mod assist throughout session.  Engaged in [place] in sitting and supine with focus on shoulder and elbow flexion/extension and horizontal abduction/adduction.  Progressed from [facility] with support at wrist and elbow to use of UE Ranger with therapist providing facilitation at elbow to minimize effects of gravity.  Engaged in abduction/adduction in supine progressing to elbow flexion/extension with and against gravity.  Therapist providing facilitation and tactile cues for increased activation.  Pt reports need to toilet.  Stand pivot transfer therapy mat > w/c > toilet with Mod assist.  Pt required assist for clothing management pre and post toileting.  Remained upright in w/c with seat belt alarm fastened, call bell in reach, and lap tray on for increased positioning of LUE.    Therapy Documentation Precautions:  Precautions Precautions: Fall Precaution Comments: L hemiparesis Restrictions Weight Bearing Restrictions: No Pain: Pain Assessment Pain Scale: 0-10 Pain Score: 0-No pain   Therapy/Group: Individual Therapy  Simonne Come 09/07/2019, 3:45 PM

## 2019-09-08 LAB — GLUCOSE, CAPILLARY: Glucose-Capillary: 136 mg/dL — ABNORMAL HIGH (ref 70–99)

## 2019-09-08 MED ORDER — INSULIN ASPART 100 UNIT/ML ~~LOC~~ SOLN
0.0000 [IU] | Freq: Every day | SUBCUTANEOUS | Status: DC
  Administered 2019-09-15 – 2019-09-18 (×3): 1 [IU] via SUBCUTANEOUS
  Administered 2019-09-25 – 2019-09-26 (×2): 3 [IU] via SUBCUTANEOUS

## 2019-09-08 NOTE — Progress Notes (Signed)
[place] PHYSICAL MEDICINE & REHABILITATION PROGRESS NOTE  Subjective/Complaints: Patient seen sitting up in bed this morning.  He states he slept well overnight.  He notes he received and donned his orthoses.  He denies complaints.  Discussed stroke trial and medications with neurology.  ROS: Denies CP, shortness of breath, nausea, vomiting, diarrhea.  Objective: Vital Signs: Blood pressure 119/67, pulse 68, temperature 98.3 F (36.8 C), resp. rate 16, height 5\' 7"  (1.702 m), weight 73.8 kg, SpO2 98 %. No results found. No results for input(s): WBC, HGB, HCT, PLT in the last 72 hours. No results for input(s): NA, K, CL, CO2, GLUCOSE, BUN, CREATININE, CALCIUM in the last 72 hours.  Physical Exam: BP 119/67 (BP Location: Right Arm)   Pulse 68   Temp 98.3 F (36.8 C)   Resp 16   Ht 5\' 7"  (1.702 m)   Wt 73.8 kg   SpO2 98%   BMI 25.48 kg/m   Constitutional: No distress . Vital signs reviewed. HENT: Normocephalic.  Atraumatic. Eyes: EOMI. No discharge. Cardiovascular: No JVD. Respiratory: Normal effort.  No stridor. GI: Non-distended. Skin: Warm and dry.  Intact. Psych: Normal mood.  Normal behavior. Musc: No edema in extremities.  No tenderness in extremities. Neuro: Alert Motor:  LUE: 0/5 proximal distal, stable LLE: Flexion, knee extension 1+/5, ankle dorsiflexion 0/5, stable  Assessment/Plan: 1. Functional deficits secondary to right paramedian pontine infarct which require 3+ hours per day of interdisciplinary therapy in a comprehensive inpatient rehab setting.  Physiatrist is providing close team supervision and 24 hour management of active medical problems listed below.  Physiatrist and rehab team continue to assess barriers to discharge/monitor patient progress toward functional and medical goals  Care Tool:  Bathing    Body parts bathed by patient: Left arm, Chest, Abdomen, Front perineal area, Right upper leg, Left upper leg, Right lower leg, Left lower leg,  Face   Body parts bathed by helper: Right arm, Buttocks     Bathing assist Assist Level: Moderate Assistance - Patient 50 - 74%     Upper Body Dressing/Undressing Upper body dressing   What is the patient wearing?: Pull over shirt    Upper body assist Assist Level: Moderate Assistance - Patient 50 - 74%    Lower Body Dressing/Undressing Lower body dressing      What is the patient wearing?: Incontinence brief, Pants     Lower body assist Assist for lower body dressing: Maximal Assistance - Patient 25 - 49%     Toileting Toileting Toileting Activity did not occur (Clothing management and hygiene only): N/A (no void or bm)  Toileting assist Assist for toileting: Minimal Assistance - Patient > 75% Assistive Device Comment: urinal   Transfers Chair/bed transfer  Transfers assist  Chair/bed transfer activity did not occur: Safety/medical concerns  Chair/bed transfer assist level: Moderate Assistance - Patient 50 - 74%     Locomotion Ambulation   Ambulation assist      Assist level: Maximal Assistance - Patient 25 - 49% Assistive device: Hand held assist Max distance: 4'   Walk 10 feet activity   Assist  Walk 10 feet activity did not occur: Safety/medical concerns(decreased strength/activity tolerance)        Walk 50 feet activity   Assist Walk 50 feet with 2 turns activity did not occur: Safety/medical concerns(decreased strength/activity tolerance)         Walk 150 feet activity   Assist Walk 150 feet activity did not occur: Safety/medical concerns(decreased strength/activity tolerance)  Walk 10 feet on uneven surface  activity   Assist Walk 10 feet on uneven surfaces activity did not occur: Safety/medical concerns(decreased strength/activity tolerance)         Wheelchair     Assist Will patient use wheelchair at discharge?: Yes Type of Wheelchair: Manual Wheelchair activity did not occur: Safety/medical concerns(Unable  without skilled intervention, L hemi)  Wheelchair assist level: Contact Guard/Touching assist Max wheelchair distance: 54ft    Wheelchair 50 feet with 2 turns activity    Assist    Wheelchair 50 feet with 2 turns activity did not occur: (Unable without skilled intervention, L hemi)   Assist Level: Contact Guard/Touching assist   Wheelchair 150 feet activity     Assist Wheelchair 150 feet activity did not occur: (Unable without skilled intervention, L hemi)   Assist Level: Dependent - Patient 0%      Medical Problem List and Plan: 1.  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  Antithrombotics: -DVT/anticoagulation: Lovenox             -antiplatelet therapy: Plavix 75 mg daily patient placed in [employer] factor XI inhibitor stroke prevention trial 3. Pain Management: Tylenol as needed  Voltaren gel changed to muscle rub right shoulder after discussion with neurology 4. Mood: Provide emotional support             -antipsychotic agents: N/A 5. Neuropsych: This patient is capable of making decisions on his own behalf. 6. Skin/Wound Care: Routine skin checks 7. Fluids/Electrolytes/Nutrition: Routine in and outs 8.  Chronic hyponatremia.  Low sodium in September through November 2020.    IVFs 100cc/hr d/ced  Na 128 on 1/13, labs ordered for tomorrow 9.  CLL/small B-cell lymphoma.  Follow-up Dr.Kale.  Patient last received chemotherapy October 2020. 10.  Type 2 diabetes mellitus with hyperglycemia.  Hemoglobin A1c 5.9.  Glucophage currently held.  Continue sliding scale  Slightly elevated on 1/17, will consider restarting Metformin if persistently elevated  Monitor with increased mobility 11.  Hyperlipidemia.  Lipitor 12.  Essential hypertension.    Controlled on 1/17  Monitor with increased mobility 13. Mild Spasticity:  Cont to monitor 14. OSA: refuses to wear  CPAP 15. Hypoalbuminemia  Supplement initiated on 1/13 16. Leukopenia  WBCs 3.8 on 1/14, labs ordered for tomorrow  See #9  Cont to monitor 17. Acute on chronic anemia  Hb 9.1 on 1/14, labs ordered for tomorrow  LOS: 5 days A FACE TO FACE EVALUATION WAS PERFORMED  Ankit Lorie Phenix 09/08/2019, 9:29 AM

## 2019-09-09 ENCOUNTER — Inpatient Hospital Stay (HOSPITAL_COMMUNITY): Payer: Medicare Other | Admitting: Occupational Therapy

## 2019-09-09 ENCOUNTER — Inpatient Hospital Stay (HOSPITAL_COMMUNITY): Payer: Medicare Other

## 2019-09-09 LAB — CBC WITH DIFFERENTIAL/PLATELET
Abs Immature Granulocytes: 0 10*3/uL (ref 0.00–0.07)
Band Neutrophils: 1 %
Basophils Absolute: 0.1 10*3/uL (ref 0.0–0.1)
Basophils Relative: 2 %
Eosinophils Absolute: 0.1 10*3/uL (ref 0.0–0.5)
Eosinophils Relative: 4 %
HCT: 28.4 % — ABNORMAL LOW (ref 39.0–52.0)
Hemoglobin: 9.6 g/dL — ABNORMAL LOW (ref 13.0–17.0)
Lymphocytes Relative: 8 %
Lymphs Abs: 0.3 10*3/uL — ABNORMAL LOW (ref 0.7–4.0)
MCH: 24.3 pg — ABNORMAL LOW (ref 26.0–34.0)
MCHC: 33.8 g/dL (ref 30.0–36.0)
MCV: 71.9 fL — ABNORMAL LOW (ref 80.0–100.0)
Monocytes Absolute: 0.2 10*3/uL (ref 0.1–1.0)
Monocytes Relative: 5 %
Neutro Abs: 2.9 10*3/uL (ref 1.7–7.7)
Neutrophils Relative %: 80 %
Platelets: 344 10*3/uL (ref 150–400)
RBC: 3.95 MIL/uL — ABNORMAL LOW (ref 4.22–5.81)
RDW: 17.2 % — ABNORMAL HIGH (ref 11.5–15.5)
WBC: 3.6 10*3/uL — ABNORMAL LOW (ref 4.0–10.5)
nRBC: 0 % (ref 0.0–0.2)
nRBC: 0 /100 WBC

## 2019-09-09 LAB — BASIC METABOLIC PANEL
Anion gap: 11 (ref 5–15)
BUN: 12 mg/dL (ref 8–23)
CO2: 24 mmol/L (ref 22–32)
Calcium: 9.7 mg/dL (ref 8.9–10.3)
Chloride: 98 mmol/L (ref 98–111)
Creatinine, Ser: 0.94 mg/dL (ref 0.61–1.24)
GFR calc Af Amer: >60 mL/min (ref >60)
GFR calc non Af Amer: >60 mL/min (ref >60)
Glucose, Bld: 154 mg/dL — ABNORMAL HIGH (ref 70–99)
Potassium: 3.5 mmol/L (ref 3.5–5.1)
Sodium: 133 mmol/L — ABNORMAL LOW (ref 135–145)

## 2019-09-09 LAB — GLUCOSE, CAPILLARY: Glucose-Capillary: 99 mg/dL (ref 70–99)

## 2019-09-09 LAB — URINALYSIS, ROUTINE W REFLEX MICROSCOPIC
Bacteria, UA: NONE SEEN
Bilirubin Urine: NEGATIVE
Glucose, UA: NEGATIVE mg/dL
Hgb urine dipstick: NEGATIVE
Ketones, ur: NEGATIVE mg/dL
Leukocytes,Ua: NEGATIVE
Nitrite: NEGATIVE
Protein, ur: NEGATIVE mg/dL
Specific Gravity, Urine: 1.014 (ref 1.005–1.030)
pH: 7 (ref 5.0–8.0)

## 2019-09-09 NOTE — Progress Notes (Signed)
U/A w/ C&S sent

## 2019-09-09 NOTE — Progress Notes (Signed)
[place] PHYSICAL MEDICINE & REHABILITATION PROGRESS NOTE  Subjective/Complaints: Patient seen sitting up in chair this morning.  He states he slept well overnight.  He denies complaints. He says he spoke with Dr. Posey Pronto about needing CBG monitoring daily instead of QID but he was still pricked multiple times yesterday.   ROS: Denies CP, shortness of breath, nausea, vomiting, diarrhea.  Objective: Vital Signs: Blood pressure (!) 145/76, pulse 69, temperature (!) 100.7 F (38.2 C), temperature source Oral, resp. rate 16, height 5\' 7"  (1.702 m), weight 73.8 kg, SpO2 98 %. No results found. No results for input(s): WBC, HGB, HCT, PLT in the last 72 hours. No results for input(s): NA, K, CL, CO2, GLUCOSE, BUN, CREATININE, CALCIUM in the last 72 hours.  Physical Exam: BP (!) 145/76 (BP Location: Right Arm)   Pulse 69   Temp (!) 100.7 F (38.2 C) (Oral)   Resp 16   Ht 5\' 7"  (1.702 m)   Wt 73.8 kg   SpO2 98%   BMI 25.48 kg/m   Constitutional: No distress . Vital signs reviewed. HENT: Normocephalic.  Atraumatic. Eyes: EOMI. No discharge. Cardiovascular: No JVD. Respiratory: Normal effort.  No stridor. GI: Non-distended. Skin: Warm and dry.  Intact. Psych: Normal mood.  Normal behavior. Musc: No edema in extremities.  No tenderness in extremities. Neuro: Alert Motor:  LUE: 0/5 proximal distal, stable LLE: Flexion, knee extension 1+/5, ankle dorsiflexion 0/5, stable  Assessment/Plan: 1. Functional deficits secondary to right paramedian pontine infarct which require 3+ hours per day of interdisciplinary therapy in a comprehensive inpatient rehab setting.  Physiatrist is providing close team supervision and 24 hour management of active medical problems listed below.  Physiatrist and rehab team continue to assess barriers to discharge/monitor patient progress toward functional and medical goals  Care Tool:  Bathing    Body parts bathed by patient: Left arm, Chest, Abdomen,  Front perineal area, Right upper leg, Left upper leg, Right lower leg, Left lower leg, Face   Body parts bathed by helper: Right arm, Buttocks     Bathing assist Assist Level: Moderate Assistance - Patient 50 - 74%     Upper Body Dressing/Undressing Upper body dressing   What is the patient wearing?: Pull over shirt    Upper body assist Assist Level: Moderate Assistance - Patient 50 - 74%    Lower Body Dressing/Undressing Lower body dressing      What is the patient wearing?: Incontinence brief, Pants     Lower body assist Assist for lower body dressing: Maximal Assistance - Patient 25 - 49%     Toileting Toileting Toileting Activity did not occur (Clothing management and hygiene only): N/A (no void or bm)  Toileting assist Assist for toileting: Minimal Assistance - Patient > 75% Assistive Device Comment: urinal   Transfers Chair/bed transfer  Transfers assist  Chair/bed transfer activity did not occur: Safety/medical concerns  Chair/bed transfer assist level: Moderate Assistance - Patient 50 - 74%     Locomotion Ambulation   Ambulation assist      Assist level: Maximal Assistance - Patient 25 - 49% Assistive device: Hand held assist Max distance: 4'   Walk 10 feet activity   Assist  Walk 10 feet activity did not occur: Safety/medical concerns(decreased strength/activity tolerance)        Walk 50 feet activity   Assist Walk 50 feet with 2 turns activity did not occur: Safety/medical concerns(decreased strength/activity tolerance)         Walk 150 feet activity  Assist Walk 150 feet activity did not occur: Safety/medical concerns(decreased strength/activity tolerance)         Walk 10 feet on uneven surface  activity   Assist Walk 10 feet on uneven surfaces activity did not occur: Safety/medical concerns(decreased strength/activity tolerance)         Wheelchair     Assist Will patient use wheelchair at discharge?: Yes Type  of Wheelchair: Manual Wheelchair activity did not occur: Safety/medical concerns(Unable without skilled intervention, L hemi)  Wheelchair assist level: Contact Guard/Touching assist Max wheelchair distance: 60ft    Wheelchair 50 feet with 2 turns activity    Assist    Wheelchair 50 feet with 2 turns activity did not occur: (Unable without skilled intervention, L hemi)   Assist Level: Contact Guard/Touching assist   Wheelchair 150 feet activity     Assist Wheelchair 150 feet activity did not occur: (Unable without skilled intervention, L hemi)   Assist Level: Dependent - Patient 0%      Medical Problem List and Plan: 1.  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  Antithrombotics: -DVT/anticoagulation: Lovenox             -antiplatelet therapy: Plavix 75 mg daily patient placed in [employer] factor XI inhibitor stroke prevention trial 3. Pain Management: Tylenol as needed  Voltaren gel changed to muscle rub right shoulder after discussion with neurology 4. Mood: Provide emotional support             -antipsychotic agents: N/A 5. Neuropsych: This patient is capable of making decisions on his own behalf. 6. Skin/Wound Care: Routine skin checks 7. Fluids/Electrolytes/Nutrition: Routine in and outs 8.  Chronic hyponatremia.  Low sodium in September through November 2020.    IVFs 100cc/hr d/ced  Na 128 on 1/13 9.  CLL/small B-cell lymphoma.  Follow-up Dr.Kale.  Patient last received chemotherapy October 2020. 10.  Type 2 diabetes mellitus with hyperglycemia.  Hemoglobin A1c 5.9.  Glucophage currently held.  Continue sliding scale  Slightly elevated on 1/17, will consider restarting Metformin if persistently elevated  1/18: stable.   Monitor with increased mobility 11.  Hyperlipidemia.  Lipitor 12.  Essential hypertension.    Controlled on 1/17, 1/18  Monitor with  increased mobility 13. Mild Spasticity:  Cont to monitor 14. OSA: refuses to wear CPAP 15. Hypoalbuminemia  Supplement initiated on 1/13 16. Leukopenia  WBCs 3.8 on 1/14  See #9  Cont to monitor  CBC in process on 1/18.  17. Acute on chronic anemia  Hb 9.1 on 1/14 18. Fever: Febrile this morning to 100.7 Received tylenol. Appears comfortable. CBC currently in process. Had episode of diaphoresis this morning which he says started after he drank warm milk. Will order BC and UA/UC to assess for infection.   LOS: 6 days A FACE TO FACE EVALUATION WAS PERFORMED  [name] [name] 09/09/2019, 9:43 AM

## 2019-09-09 NOTE — Progress Notes (Signed)
Speech Language Pathology Daily Session Note  Patient Details  Name: [name] [name] MRN: [id number] Date of Birth: 1956/05/21  Today's Date: 09/09/2019 SLP Individual Time: [id number] SLP Individual Time Calculation (min): 44 min  Short Term Goals: Week 1: SLP Short Term Goal 1 (Week 1): Patient will demonstrate complex problem solving for functional and familiar tasks with supervision level verbal cues. SLP Short Term Goal 2 (Week 1): Patient will recall new, daily information with supervision level verbal cues for use of memory compensatory strategies.  Skilled Therapeutic Interventions: Skilled ST services focused on cognitive skills. Pt described current medication system at home (Am bag and PM bag), SLP educated pt on current scheduled medication x3 all administered once per day, therefore pt's current medication is functional and no further need to assess with pill organizer. Pt expressed paying bills via phone and balancing account, SLP recommends to target money management in upcoming sessions. SLP facilitated complex problem solving and recall strategies skills within scheduling task, pt required max A fade to min A verbal cues (3/4th way through task) for organization and error awareness. Following completion of task, pt expressed strategies utilized for executive function, rereading paragraph after each item was scheduled to reorganize thought process. Pt demonstrated recall of x3 medications with use of visual aid. Pt was left in room with call bell within reach and chair alarm set. ST recommends to continue skilled ST services.      Pain Pain Assessment Pain Score: 0-No pain  Therapy/Group: Individual Therapy  Adien Kimmel  [facility] 09/09/2019, 2:47 PM

## 2019-09-09 NOTE — Progress Notes (Addendum)
Occupational Therapy Session Note  Patient Details  Name: [name] [name] MRN: [id number] Date of Birth: [birth year]  Today's Date: 09/09/2019 OT Individual Time: [id number] Individual Time Calculation (min): 72 min    Short Term Goals: Week 1:  OT Short Term Goal 1 (Week 1): Pt will recall hemi dressing techniques wiht no VC to don shirt OT Short Term Goal 2 (Week 1): Pt will transfer to toilet with MIN A and LRAD OT Short Term Goal 3 (Week 1): Pt will manage LUE prior o transfers to demo improved L attention  Skilled Therapeutic Interventions/Progress Updates:  Upon entering the room, pt seated in wheelchair and awaiting OT arrival. Pt with no c/o pain and agreeable to OT intervention. OT assisted pt to day room. Pt performed sit <>stand from wheelchair with min cuing for technique with mod lifting assistance. Pt standing at high low table with L UE placed into weight bearing position and L knee blocked. Pt standing for 3 minutes, 8 minutes, and 5 minutes respectively to reach towards L and R to obtain needed items to make pipe tree design. Pt with no significant LOB and required min guard - min A for standing balance with min cuing for upright posture. Pt taking seated rest breaks as needed secondary to fatigue. OT demonstrated and pt returned demonstration for L UE self ROM in all planes of movement x 5 reps each with min multimodal cuing for technique. Pt verbalized need for toileting and assisted back to room. Pt requesting to attempt standing for urinal. Pt standing with mod A for balance and mod A needed for LB clothing management. Pt unable to void in standing and assisted with weight shift and advancement of L LE to turn around and sit on commode. Pt able to void once seated. Pt returning to wheelchair in same manner and engaged in hand hygiene at sink with increased time. Pt remained in wheelchair with lap tray donned and alarm belt donned. Call bell within reach.   Therapy  Documentation Precautions:  Precautions Precautions: Fall Precaution Comments: L hemiparesis Restrictions Weight Bearing Restrictions: No Other Treatments:     Therapy/Group: Individual Therapy  Gypsy Decant 09/09/2019, 11:11 AM

## 2019-09-09 NOTE — Progress Notes (Signed)
Physical Therapy Session Note  Patient Details  Name: [name] [name] MRN: [id number] Date of Birth: 1956/05/15  Today's Date: 09/09/2019 PT Individual Time: 0800-0900 PT Individual Time Calculation (min): 60 min   Short Term Goals: Week 1:  PT Short Term Goal 1 (Week 1): Patient will perform bed mobility with min A. PT Short Term Goal 2 (Week 1): Patient will perform basic transfers with min A using LRAD. PT Short Term Goal 3 (Week 1): Patient will ambulate >20 feet using LRAD. PT Short Term Goal 4 (Week 1): Patient will propel w/c >50 feet using R hemi technique.  Skilled Therapeutic Interventions/Progress Updates:   Received pt supine in bed, pt agreeable to therapy, and denied any pain during session. Session focused on functional mobility/transfers, ambulation, L AFO fitting, and improved endurance with activity. Pt sweating profusely at start of session stating he drank hot milk and all of a sudden began sweating uncontrollably; shirt and bedsheets soaked. RN and MD made aware. Therapist provided pt with multiple cool washcloths and placed on head, neck, and back for relief. Pt donned clean incontinence brief and pants with mod A in supine. Pt performed bed mobility with CGA and increased time with verbal cues for technique. Pt doffed sweaty shirt and donned clean shirt with mod A. Therapist donned shoes max A for time management purposes. Pt transferred stand<>pivot to R without AD mod A. RN administered medication. Pt transported to therapy gym in [facility] for time management purposes. Pt ambulated 57ft with RW mod A with tactile cues for L weight shifting and manual facilitation to bring LLE through for last 45ft due to increased fatigue. Pt with increased L toe drag and L knee hyperextension. Pt ambulated additional 62ft with L AFO mod A with RW. Pt with increased LLE foot clearance and decreased hyperextension with AFO. Pt requested to use restroom. Pt continent of urine seated in WC using urinal  with min A. Concluded session with pt sitting in WC, needs within reach, seatbelt alarm on, and MD present at bedside.   Therapy Documentation Precautions:  Precautions Precautions: Fall Precaution Comments: L hemiparesis Restrictions Weight Bearing Restrictions: No   Therapy/Group: Individual Therapy Alfonse Alpers PT, DPT   09/09/2019, 7:30 AM

## 2019-09-09 NOTE — Progress Notes (Signed)
Pt was running a temperature o f 00.5 at 19:46, administered prn Tylenol. Pt temperature increased again to 100.7 at 06:21. Prn tylenol given.

## 2019-09-09 NOTE — Progress Notes (Signed)
Orthopedic Tech Progress Note Patient Details:  [name] [name] [birth year] [id number]  Patient ID: [name] [name], male   DOB: 11-Oct-1955, 64 y.o.   MRN: [id number]   [name] [name] 09/09/2019, 1:16 PMCalled [employer] for brace.

## 2019-09-10 ENCOUNTER — Inpatient Hospital Stay (HOSPITAL_COMMUNITY): Payer: Medicare Other

## 2019-09-10 ENCOUNTER — Inpatient Hospital Stay (HOSPITAL_COMMUNITY): Payer: Medicare Other | Admitting: Occupational Therapy

## 2019-09-10 ENCOUNTER — Encounter (HOSPITAL_COMMUNITY): Payer: Medicare Other | Admitting: Psychology

## 2019-09-10 DIAGNOSIS — F329 Major depressive disorder, single episode, unspecified: Secondary | ICD-10-CM

## 2019-09-10 DIAGNOSIS — R509 Fever, unspecified: Secondary | ICD-10-CM

## 2019-09-10 LAB — URINE CULTURE: Culture: NO GROWTH

## 2019-09-10 LAB — GLUCOSE, CAPILLARY: Glucose-Capillary: 108 mg/dL — ABNORMAL HIGH (ref 70–99)

## 2019-09-10 MED ORDER — ZOLPIDEM TARTRATE 5 MG PO TABS
10.0000 mg | ORAL_TABLET | Freq: Every day | ORAL | Status: DC
  Administered 2019-09-10 – 2019-09-16 (×7): 10 mg via ORAL
  Filled 2019-09-10 (×7): qty 2

## 2019-09-10 NOTE — Progress Notes (Signed)
Occupational Therapy Session Note  Patient Details  Name: [name] [name] MRN: [id number] Date of Birth: Nov 11, 1955  Today's Date: 09/10/2019 OT Individual Time: 1300-1400 OT Individual Time Calculation (min): 60 min    Short Term Goals: Week 1:  OT Short Term Goal 1 (Week 1): Pt will recall hemi dressing techniques wiht no VC to don shirt OT Short Term Goal 2 (Week 1): Pt will transfer to toilet with MIN A and LRAD OT Short Term Goal 3 (Week 1): Pt will manage LUE prior o transfers to demo improved L attention  Skilled Therapeutic Interventions/Progress Updates:    Treatment session with focus on functional transfers, standing balance, and incorporating of LUE. Pt received upright in w/c agreeable to shower.  Completed stand pivot transfer w/c > shower seat with Min assist.  Pt completed bathing with lateral leans to wash buttocks and then therapist assisted for thoroughness while pt standing with CGA and UE support on grab bar.  Therapist provided hand over hand assist with LUE to wash Rt arm and then when applying deodorant.  Pt required increased time and multimodal cues for sequencing UB and LB dressing with hemi-technique.  Pt able to pull pants over hips while standing with CGA and LUE support on sink for WB.  Therapist donned socks and shoes with AFO for time.  Pt remained upright in w/c with all needs in reach and seat belt alarm fastened.  Therapy Documentation Precautions:  Precautions Precautions: Fall Precaution Comments: L hemiparesis Restrictions Weight Bearing Restrictions: No General:   Vital Signs: Therapy Vitals Temp: 98.7 F (37.1 C) Pulse Rate: (!) 57 Resp: 16 BP: 125/75 Patient Position (if appropriate): Sitting Oxygen Therapy SpO2: 100 % O2 Device: Room Air Pain:  Pt with no c/o pain   Therapy/Group: Individual Therapy  Simonne Come 09/10/2019, 3:27 PM

## 2019-09-10 NOTE — Progress Notes (Signed)
Speech Language Pathology Weekly Progress and Session Note  Patient Details  Name: Symir R Gerken MRN: [id number] Date of Birth: 05/13/1956  Beginning of progress report period: September 04, 2018 End of progress report period: September 09, 2018  Today's Date: 09/10/2019 SLP Individual Time: 0903-0958 SLP Individual Time Calculation (min): 55 min  Short Term Goals: Week 1: SLP Short Term Goal 1 (Week 1): Patient will demonstrate complex problem solving for functional and familiar tasks with supervision level verbal cues. SLP Short Term Goal 1 - Progress (Week 1): Not met SLP Short Term Goal 2 (Week 1): Patient will recall new, daily information with supervision level verbal cues for use of memory compensatory strategies. SLP Short Term Goal 2 - Progress (Week 1): Met    New Short Term Goals: Week 2: SLP Short Term Goal 1 (Week 2): Patient will demonstrate complex problem solving for functional and familiar tasks with supervision level verbal cues. SLP Short Term Goal 2 (Week 2): Patient will recall new, daily information with Mod I for use of memory compensatory strategies.  Weekly Progress Updates: Pt demonstrated good progress meeting 1 out 2 goals this reporting period. Pt demonstrated increase in recall of daily information and within complex tasks. Pt completed semi-complex to complex functional problem solving skills in time/money/medication management with min A verbal cues for error awareness, thought organization and recall within complex tasks. Pt would continue to benefit from skilled ST services in order to maximize functional independence and reduce burden of care.     Intensity: Minumum of 1-2 x/day, 30 to 90 minutes Frequency: 3 to 5 out of 7 days Duration/Length of Stay: 2-2.5 weeks Treatment/Interventions: Cognitive remediation/compensation;Internal/external aids;Cueing hierarchy;Environmental controls;Therapeutic Activities;Patient/family education;Functional  tasks   Daily Session  Skilled Therapeutic Interventions:  Skilled ST services focused on cognitive skills. Pt demonstrated recall of yesterday's SLP task (scheudling tour in [place]) with intial supervision A verbal cues. SLP facilitated functional complex problem solving and error awareness skills in account balancing task, pt required min A verbal cues for error awareness. Pt demonstrated alternating attention as well as ability to organize charges (payement verse deposits) and recall within task. Pt desired to complete math calculations by hand, was reluctant to utilize calculator for assessment of errors, however after extensive education agreed to utilize novel strategy. Pt also began novel deductive reasoning task, targeting complex problem solving, pt required max A verbal cues for step by step instruction to assist with organization of thought, note taking strategies for recall within task and inferencing. Pt was left in room with call bell within reach and bed alarm set. ST recommends to continue skilled ST services.     General    Pain Pain Assessment Pain Scale: 0-10(premedicated prior to therapy) Pain Score: 0-No pain  Therapy/Group: Individual Therapy  MADISON  CRATCH 09/10/2019, 12:01 PM         
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
## 2019-09-10 NOTE — Progress Notes (Addendum)
Pt slept on and off through the night. Pt had elevated temp around 0240 of 100.4. Prn tylenol provided and elevated temp went down to 98.5 No signs of distress at this time, Call light in reach.

## 2019-09-10 NOTE — Progress Notes (Signed)
[facility] PHYSICAL MEDICINE & REHABILITATION PROGRESS NOTE  Subjective/Complaints: Patient seen sitting up in bed this morning.  He states he did not sleep well overnight because he did not receive his Ambien despite asking for it.  Patient noted to have a low-grade temperature overnight.  ROS: Denies CP, shortness of breath, nausea, vomiting, diarrhea.  Objective: Vital Signs: Blood pressure 107/66, pulse (!) 59, temperature 97.8 F (36.6 C), resp. rate 18, height 5\' 7"  (1.702 m), weight 73.8 kg, SpO2 97 %. No results found. Recent Labs    09/09/19 0914  WBC 3.6*  HGB 9.6*  HCT 28.4*  PLT 344   Recent Labs    09/09/19 0914  NA 133*  K 3.5  CL 98  CO2 24  GLUCOSE 154*  BUN 12  CREATININE 0.94  CALCIUM 9.7    Physical Exam: BP 107/66 (BP Location: Left Arm)   Pulse (!) 59   Temp 97.8 F (36.6 C)   Resp 18   Ht 5\' 7"  (1.702 m)   Wt 73.8 kg   SpO2 97%   BMI 25.48 kg/m   Constitutional: No distress . Vital signs reviewed. HENT: Normocephalic.  Atraumatic. Eyes: EOMI. No discharge. Cardiovascular: No JVD. Respiratory: Normal effort.  No stridor. GI: Non-distended. Skin: Warm and dry.  Intact. Psych: Normal mood.  Normal behavior. Musc: No edema in extremities.  No tenderness in extremities. Neuro: Alert Motor:  LUE: 0/5 proximal distal, unchanged LLE: Hip flexion, knee extension 1+/5, ankle dorsiflexion 0/5, unchanged No increase in tone appreciated in left upper extremity  Assessment/Plan: 1. Functional deficits secondary to right paramedian pontine infarct which require 3+ hours per day of interdisciplinary therapy in a comprehensive inpatient rehab setting.  Physiatrist is providing close team supervision and 24 hour management of active medical problems listed below.  Physiatrist and rehab team continue to assess barriers to discharge/monitor patient progress toward functional and medical goals  Care Tool:  Bathing    Body parts bathed by  patient: Left arm, Chest, Abdomen, Front perineal area, Right upper leg, Left upper leg, Right lower leg, Left lower leg, Face   Body parts bathed by helper: Right arm, Buttocks     Bathing assist Assist Level: Moderate Assistance - Patient 50 - 74%     Upper Body Dressing/Undressing Upper body dressing   What is the patient wearing?: Pull over shirt    Upper body assist Assist Level: Moderate Assistance - Patient 50 - 74%    Lower Body Dressing/Undressing Lower body dressing      What is the patient wearing?: Incontinence brief, Pants     Lower body assist Assist for lower body dressing: Moderate Assistance - Patient 50 - 74%     Toileting Toileting Toileting Activity did not occur (Clothing management and hygiene only): N/A (no void or bm)  Toileting assist Assist for toileting: Moderate Assistance - Patient 50 - 74% Assistive Device Comment: urinal   Transfers Chair/bed transfer  Transfers assist  Chair/bed transfer activity did not occur: Safety/medical concerns  Chair/bed transfer assist level: Moderate Assistance - Patient 50 - 74%     Locomotion Ambulation   Ambulation assist      Assist level: Moderate Assistance - Patient 50 - 74% Assistive device: Walker-rolling Max distance: 76ft   Walk 10 feet activity   Assist  Walk 10 feet activity did not occur: Safety/medical concerns(decreased strength/activity tolerance)  Assist level: Moderate Assistance - Patient - 50 - 74% Assistive device: Walker-rolling   Walk 50 feet  activity   Assist Walk 50 feet with 2 turns activity did not occur: Safety/medical concerns(decreased strength/activity tolerance)         Walk 150 feet activity   Assist Walk 150 feet activity did not occur: Safety/medical concerns(decreased strength/activity tolerance)         Walk 10 feet on uneven surface  activity   Assist Walk 10 feet on uneven surfaces activity did not occur: Safety/medical concerns(decreased  strength/activity tolerance)         Wheelchair     Assist Will patient use wheelchair at discharge?: Yes Type of Wheelchair: Manual Wheelchair activity did not occur: Safety/medical concerns(Unable without skilled intervention, L hemi)  Wheelchair assist level: Contact Guard/Touching assist Max wheelchair distance: 48ft    Wheelchair 50 feet with 2 turns activity    Assist    Wheelchair 50 feet with 2 turns activity did not occur: (Unable without skilled intervention, L hemi)   Assist Level: Contact Guard/Touching assist   Wheelchair 150 feet activity     Assist Wheelchair 150 feet activity did not occur: (Unable without skilled intervention, L hemi)   Assist Level: Dependent - Patient 0%      Medical Problem List and Plan: 1.  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  Antithrombotics: -DVT/anticoagulation: Lovenox             -antiplatelet therapy: Plavix 75 mg daily patient placed in [employer] factor XI inhibitor stroke prevention trial 3. Pain Management: Tylenol as needed  Voltaren gel changed to muscle rub right shoulder after discussion with neurology 4. Mood: Provide emotional support             -antipsychotic agents: N/A 5. Neuropsych: This patient is capable of making decisions on his own behalf. 6. Skin/Wound Care: Routine skin checks 7. Fluids/Electrolytes/Nutrition: Routine in and outs 8.  Chronic hyponatremia.  Low sodium in September through November 2020.    IVFs 100cc/hr d/ced  Na 133 on 1/18 9.  CLL/small B-cell lymphoma.  Follow-up Dr.Kale.  Patient last received chemotherapy October 2020. 10.  Type 2 diabetes mellitus with hyperglycemia.  Hemoglobin A1c 5.9.  Glucophage currently held.  Continue sliding scale  Relatively controlled on 1/19  Monitor with increased mobility 11.  Hyperlipidemia.  Lipitor 12.  Essential  hypertension.    Controlled on 1/19  Monitor with increased mobility 13. Mild Spasticity:  Cont to monitor 14. OSA: refuses to wear CPAP 15. Hypoalbuminemia  Supplement initiated on 1/13 16. Leukopenia  WBCs 3.6 on 1/18  See #9  Cont to monitor 17. Acute on chronic anemia  Hb 9.6 on 1/18 18. Fever:   Low-grade temp again overnight   UA negative, urine culture pending  Blood cultures no growth to date  Chest x-ray ordered  LOS: 7 days A FACE TO FACE EVALUATION WAS PERFORMED   Lorie Phenix 09/10/2019, 8:22 AM

## 2019-09-10 NOTE — Progress Notes (Signed)
Physical Therapy Weekly Progress Note  Patient Details  Name: [name] [name] MRN: [id number] Date of Birth: Jan 14, 1956  Beginning of progress report period: September 04, 2019 End of progress report period: September 10, 2019  Today's Date: 09/10/2019 PT Individual Time: 1100-1200 PT Individual Time Calculation (min): 60 min   Patient has met 4 of 4 short term goals. Pt demonstrates improvements in functional mobility/transfers, LE strength and muscle activation, ambulation, dynamic standing balance, and improved endurance with activity. Pt currently able to perform bed mobility with supervision and increased time, transfers with LRAD min A, ambulation 76f with RW min A, and WC mobility 1586fusing L hemi technique. However, pt continues to demonstrate difficulty with LLE hyperextension and foot clearance during gait, dynamic standing balance, and c/o of visual impairments that occasionally affect performance.   Patient continues to demonstrate the following deficits muscle weakness, impaired timing and sequencing, unbalanced muscle activation, decreased coordination and decreased motor planning and decreased standing balance, decreased postural control, hemiplegia and decreased balance strategies and therefore will continue to benefit from skilled PT intervention to increase functional independence with mobility.  Patient progressing toward long term goals..  Continue plan of care.  PT Short Term Goals Week 1:  PT Short Term Goal 1 (Week 1): Patient will perform bed mobility with min A. PT Short Term Goal 1 - Progress (Week 1): Met PT Short Term Goal 2 (Week 1): Patient will perform basic transfers with min A using LRAD. PT Short Term Goal 2 - Progress (Week 1): Met PT Short Term Goal 3 (Week 1): Patient will ambulate >20 feet using LRAD. PT Short Term Goal 3 - Progress (Week 1): Met PT Short Term Goal 4 (Week 1): Patient will propel w/c >50 feet using R hemi technique. PT Short Term Goal 4 -  Progress (Week 1): Met Week 2:  PT Short Term Goal 1 (Week 2): Pt will perform bed<>chair transfer with LRAD CGA PT Short Term Goal 2 (Week 2): Pt will ambulate 5082fith LRAD CGA PT Short Term Goal 3 (Week 2): Pt will perform car transfer with LRAD CGA  Skilled Therapeutic Interventions/Progress Updates:  Ambulation/gait training;Discharge planning;Functional mobility training;Psychosocial support;Therapeutic Activities;Visual/perceptual remediation/compensation;Balance/vestibular training;Disease management/prevention;Neuromuscular re-education;Skin care/wound management;Therapeutic Exercise;Wheelchair propulsion/positioning;Cognitive remediation/compensation;DME/adaptive equipment instruction;Pain management;Splinting/orthotics;UE/LE Strength taining/ROM;Community reintegration;Functional electrical stimulation;Patient/family education;Stair training;UE/LE Coordination activities   Today's Interventions: Received pt supine in bed, pt agreeable to therapy, and denied any pain during today's session. Pt stated he was extremely fatigued today because he didn't receive his sleeping medicine last night. Session focused on functional mobility/transfers, ambulation, LE strength, L NMR, balance/coordination/motor planning, and improved endurance with activity. Pt donned new incontinence brief and pants in supine with mod A. Pt performed bed mobility with supervision and increased time. Pt transferred bed<>WC without AD min A. Therapist donned shoes and L AFO max A for time management purposes. Pt performed WC mobility 150f40fth supervision using L hemi technique. Pt ambulated 30ft31fh RW min A with L AFO donned. Pt continues to demonstrate decreased L hip flexion and DF and decreased stride length; however significantly less hyperextension with AFO donned. Pt performed standing heel taps to tape on floor with LLE 1x5 reps and 1x10 reps min A with verbal cues for technique. Pt required multiple rest breaks  during session. Concluded session with pt sitting in WC, needs within reach, and seatbelt alarm on.   Therapy Documentation Precautions:  Precautions Precautions: Fall Precaution Comments: L hemiparesis Restrictions Weight Bearing Restrictions: No   Therapy/Group: Individual Therapy  Betsey Holiday Lyn Hollingshead PT, DPT   09/10/2019, 7:50 AM

## 2019-09-11 ENCOUNTER — Inpatient Hospital Stay (HOSPITAL_COMMUNITY): Payer: Medicare Other

## 2019-09-11 ENCOUNTER — Inpatient Hospital Stay (HOSPITAL_COMMUNITY): Payer: Medicare Other | Admitting: Occupational Therapy

## 2019-09-11 DIAGNOSIS — R918 Other nonspecific abnormal finding of lung field: Secondary | ICD-10-CM

## 2019-09-11 LAB — GLUCOSE, CAPILLARY: Glucose-Capillary: 104 mg/dL — ABNORMAL HIGH (ref 70–99)

## 2019-09-11 NOTE — Patient Care Conference (Signed)
Inpatient RehabilitationTeam Conference and Plan of Care Update Date: 09/11/2019   Time: 11:35 AM   Patient Name: [name] [name]      Medical Record Number: [id number]  Date of Birth: June 13, 1956 Sex: Male         Room/Bed: 4W07C/4W07C-01 Payor Info: Payor: [employer] MEDICARE / Plan: [facility] MEDICARE / Product Type: *No Product type* /    Admit Date/Time:  09/03/2019  3:09 PM  Primary Diagnosis:  Right pontine CVA [facility])  Patient Active Problem List   Diagnosis Date Noted  . Opacity of lung on imaging study   . Fever   . Reactive depression   . Benign essential HTN   . Labile blood glucose   . Acute on chronic anemia   . Lymphopenia   . Hypoalbuminemia due to protein-calorie malnutrition ([place])   . Controlled type 2 diabetes mellitus with hyperglycemia, without long-term current use of insulin ([place])   . Right pontine CVA ([place]) 09/03/2019  . Acute ischemic stroke ([place]) 08/29/2019  . Hyponatremia 08/29/2019  . HTN (hypertension) 08/29/2019  . DM2 (diabetes mellitus, type 2) ([place]) 08/29/2019  . CLL (chronic lymphocytic leukemia) ([place]) 04/10/2019  . Counseling regarding advance care planning and goals of care 04/10/2019  . Deviated septum 05/18/2018  . Iron deficiency anemia 02/26/2018    Expected Discharge Date: Expected Discharge Date: 09/21/19  Team Members Present: Physician leading conference: Dr. Delice Lesch Social Worker Present: Lennart Pall, LCSW Nurse Present: Dorien Chihuahua, RN;Hilary Renee Rival, RN Case Manager: Karene Fry, RN PT Present: Becky Sax, PT OT Present: Simonne Come, OT SLP Present: Jettie Booze, CF-SLP PPS Coordinator present : Gunnar Fusi, SLP     Current Status/Progress Goal Weekly Team Focus  Bowel/Bladder   Patient is continent of bladder and bowl  Remain continent  Monitor for changes every shift   Swallow/Nutrition/ Hydration             ADL's   min assist stand pivot transfers, Min assist bathing, Mod assist dressing, total assist to don  socks and shoes  Supervision overall  ADL retraining, LUE NMR, transfers, dynamic standing balance   Mobility   bed mobility supervision, transfers min A with RW, ambulation 20ft RW min A, WC mobility 166ft supervision  supervision with LRAD  functional mobility/transfers, ambulation, LE strength, balance/NMR/motor planning, improved endurance   Communication             Safety/Cognition/ Behavioral Observations  Min A semi-complex to complex problem solving, Supervision A daily recall  Mod I  complex problem solving, recall   Pain   Patient dies pain at this time  Remain pain free  Assess pain every shift/PRN   Skin   Skin is intact  Prevent skin breakdown  Assess skin every shift    Rehab Goals Patient on target to meet rehab goals: Yes *See Care Plan and progress notes for long and short-term goals.     Barriers to Discharge  Current Status/Progress Possible Resolutions Date Resolved   Nursing                  PT                    OT                  SLP                SW Decreased caregiver support;Lack of/limited family support Son works 6p-6a, mother at patient's home  has aide assistance a couple of hours a day Set to discharge home with son, ramped entrance to home          Discharge Planning/Teaching Needs:  Discharge home with son  TBD   Team Discussion: Intermittent fevers, CXray persistent opacity.  RN cont B/B.  OT transfers min/mod, flat affect, neuropsych saw yesterday, min B/D, mod LB dressing, shoulder pain, L knee pain, S goals.  PT S bed, transfers min A RW 30' min A, w/c propulsion 150', S goals, min A stair goals.  SLP problem solving/STM min A, S recall, goals mod I.  Has 63 yo son as caregiver.   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 Weekly Focus/Goal: Improve mobility, leucopenia, lung opacity  Barriers to  Discharge: Medical stability   Possible Resolutions to Barriers: Therapies, follow labs, follow up with Heme/Onc regarding opacity   Continued Need for Acute Rehabilitation Level of Care: The patient requires daily medical management by a physician with specialized training in physical medicine and rehabilitation for the following reasons: Direction of a multidisciplinary physical rehabilitation program to maximize functional independence : Yes Medical management of patient stability for increased activity during participation in an intensive rehabilitation regime.: Yes Analysis of laboratory values and/or radiology reports with any subsequent need for medication adjustment and/or medical intervention. : Yes   I attest that I was present, lead the team conference, and concur with the assessment and plan of the team.   Retta Diones 09/11/2019, 9:16 PM   Team conference was held via web/ teleconference due to [place] - 19

## 2019-09-11 NOTE — Progress Notes (Signed)
Occupational Therapy Weekly Progress Note  Patient Details  Name: [name] [name] MRN: [id number] Date of Birth: 03-30-1956  Beginning of progress report period: September 04, 2019 End of progress report period: September 11, 2019   Patient has met 1 of 3 short term goals.  Pt is making steady progress towards goals.  Pt currently demonstrates ability to complete stand pivot transfers from w/c with min assist.  Pt completes sit > stand for self-care tasks with min assist.  Pt continues to require mod-max assist with LB dressing due to decreased ability to lift LLE against gravity and difficulty advancing pants over hips.  Pt also requires max-total assist with donning socks/shoes and Lt AFO.  Pt reports pain in Lt shoulder, slight subluxation may be developing.  Extensive education ongoing regarding positioning of LUE prior to and during mobility to maintain shoulder joint.    Patient continues to demonstrate the following deficits: muscle weakness, decreased cardiorespiratoy endurance, unbalanced muscle activation, motor apraxia, decreased coordination and decreased motor planning, decreased visual perceptual skills and decreased visual motor skills, decreased attention to left, decreased problem solving, decreased safety awareness and decreased memory and decreased sitting balance, decreased standing balance, decreased postural control, hemiplegia and decreased balance strategies and therefore will continue to benefit from skilled OT intervention to enhance overall performance with BADL.  Patient progressing toward long term goals..  Continue plan of care.  OT Short Term Goals Week 1:  OT Short Term Goal 1 (Week 1): Pt will recall hemi dressing techniques wiht no VC to don shirt OT Short Term Goal 1 - Progress (Week 1): Progressing toward goal OT Short Term Goal 2 (Week 1): Pt will transfer to toilet with MIN A and LRAD OT Short Term Goal 2 - Progress (Week 1): Met OT Short Term Goal 3 (Week 1): Pt  will manage LUE prior o transfers to demo improved L attention OT Short Term Goal 3 - Progress (Week 1): Progressing toward goal Week 2:  OT Short Term Goal 1 (Week 2): Pt will recall hemi dressing techniques wiht no VC to don shirt OT Short Term Goal 2 (Week 2): Pt will manage LUE prior to transfers to demo improved L attention OT Short Term Goal 3 (Week 2): Pt will complete toilet transfer with Min assist, ambulating with RW OT Short Term Goal 4 (Week 2): Pt will complete LB dressing with min assist (minus Lt shoe/AFO)    Elodia Haviland, [facility] 09/11/2019, 3:48 PM

## 2019-09-11 NOTE — Progress Notes (Signed)
[facility] PHYSICAL MEDICINE & REHABILITATION PROGRESS NOTE  Subjective/Complaints: Patient seen laying in bed this AM.  He states he slept well overnight with ambien.  He states he wants to rest some more.   ROS: Denies CP, shortness of breath, nausea, vomiting, diarrhea.  Objective: Vital Signs: Blood pressure (!) 142/82, pulse (!) 59, temperature 99.8 F (37.7 C), resp. rate 18, height 5\' 7"  (1.702 m), weight 73.8 kg, SpO2 99 %. DG Chest 2 View  Result Date: 09/10/2019 CLINICAL DATA:  Fevers EXAM: CHEST - 2 VIEW COMPARISON:  08/29/2019 FINDINGS: Persistent opacification of the medial aspect of the right lung base is noted. No new focal abnormality is seen. Cardiac shadow is stable. The lungs are otherwise clear. Bony structures are within normal limits. IMPRESSION: Stable opacity in the medial aspect of the right lung base similar to that seen on prior exam. Electronically Signed   By: Inez Catalina M.D.   On: 09/10/2019 10:52   Recent Labs    09/09/19 0914  WBC 3.6*  HGB 9.6*  HCT 28.4*  PLT 344   Recent Labs    09/09/19 0914  NA 133*  K 3.5  CL 98  CO2 24  GLUCOSE 154*  BUN 12  CREATININE 0.94  CALCIUM 9.7    Physical Exam: BP (!) 142/82   Pulse (!) 59   Temp 99.8 F (37.7 C)   Resp 18   Ht 5\' 7"  (1.702 m)   Wt 73.8 kg   SpO2 99%   BMI 25.48 kg/m   Constitutional: No distress . Vital signs reviewed. HENT: Normocephalic.  Atraumatic. Eyes: EOMI. No discharge. Cardiovascular: No JVD. Respiratory: Normal effort.  No stridor. GI: Non-distended. Skin: Warm and dry.  Intact. Psych: Normal mood.  Normal behavior. Musc: No edema in extremities.  No tenderness in extremities. Neuro: Alert Motor:  LUE: shoulder abduction 1+/5, 0/5 distally  LLE: Hip flexion, knee extension 1+/5, ankle dorsiflexion 0/5, unchanged  Assessment/Plan: 1. Functional deficits secondary to right paramedian pontine infarct which require 3+ hours per day of interdisciplinary therapy in  a comprehensive inpatient rehab setting.  Physiatrist is providing close team supervision and 24 hour management of active medical problems listed below.  Physiatrist and rehab team continue to assess barriers to discharge/monitor patient progress toward functional and medical goals  Care Tool:  Bathing    Body parts bathed by patient: Left arm, Chest, Abdomen, Front perineal area, Right upper leg, Left upper leg, Right lower leg, Left lower leg, Face   Body parts bathed by helper: Right arm, Buttocks     Bathing assist Assist Level: Minimal Assistance - Patient > 75%     Upper Body Dressing/Undressing Upper body dressing   What is the patient wearing?: Pull over shirt    Upper body assist Assist Level: Moderate Assistance - Patient 50 - 74%    Lower Body Dressing/Undressing Lower body dressing      What is the patient wearing?: Incontinence brief, Pants     Lower body assist Assist for lower body dressing: Moderate Assistance - Patient 50 - 74%     Toileting Toileting Toileting Activity did not occur (Clothing management and hygiene only): N/A (no void or bm)  Toileting assist Assist for toileting: Moderate Assistance - Patient 50 - 74% Assistive Device Comment: urinal   Transfers Chair/bed transfer  Transfers assist  Chair/bed transfer activity did not occur: Safety/medical concerns  Chair/bed transfer assist level: Minimal Assistance - Patient > 75%     Locomotion  Ambulation   Ambulation assist      Assist level: Minimal Assistance - Patient > 75% Assistive device: Walker-rolling Max distance: 6ft   Walk 10 feet activity   Assist  Walk 10 feet activity did not occur: Safety/medical concerns(decreased strength/activity tolerance)  Assist level: Minimal Assistance - Patient > 75% Assistive device: Walker-rolling   Walk 50 feet activity   Assist Walk 50 feet with 2 turns activity did not occur: Safety/medical concerns(decreased  strength/activity tolerance)         Walk 150 feet activity   Assist Walk 150 feet activity did not occur: Safety/medical concerns(decreased strength/activity tolerance)         Walk 10 feet on uneven surface  activity   Assist Walk 10 feet on uneven surfaces activity did not occur: Safety/medical concerns(decreased strength/activity tolerance)         Wheelchair     Assist Will patient use wheelchair at discharge?: Yes Type of Wheelchair: Manual Wheelchair activity did not occur: Safety/medical concerns(Unable without skilled intervention, L hemi)  Wheelchair assist level: Supervision/Verbal cueing Max wheelchair distance: 170ft    Wheelchair 50 feet with 2 turns activity    Assist    Wheelchair 50 feet with 2 turns activity did not occur: (Unable without skilled intervention, L hemi)   Assist Level: Supervision/Verbal cueing   Wheelchair 150 feet activity     Assist Wheelchair 150 feet activity did not occur: (Unable without skilled intervention, L hemi)   Assist Level: Supervision/Verbal cueing      Medical Problem List and Plan: 1.  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  2.  Antithrombotics: -DVT/anticoagulation: Lovenox             -antiplatelet therapy: Plavix 75 mg daily patient placed in [employer] factor XI inhibitor stroke prevention trial 3. Pain Management: Tylenol as needed  Voltaren gel changed to muscle rub right shoulder after discussion with neurology 4. Mood: Provide emotional support             -antipsychotic agents: N/A 5. Neuropsych: This patient is capable of making decisions on his own behalf. 6. Skin/Wound Care: Routine skin checks 7.  Fluids/Electrolytes/Nutrition: Routine in and outs 8.  Chronic hyponatremia.  Low sodium in September through November 2020.    IVFs 100cc/hr d/ced  Na 133 on 1/18 9.  CLL/small B-cell lymphoma.  Follow-up Dr.Kale.  Patient last received chemotherapy October 2020. 10.  Type 2 diabetes mellitus with hyperglycemia.  Hemoglobin A1c 5.9.  Glucophage currently held.  Continue sliding scale  Relatively controlled on 1/20  Monitor with increased mobility 11.  Hyperlipidemia.  Lipitor 12.  Essential hypertension.    Relatively controlled on 1/20  Monitor with increased mobility 13. Mild Spasticity:  Cont to monitor 14. OSA: refuses to wear CPAP 15. Hypoalbuminemia  Supplement initiated on 1/13 16. Leukopenia  WBCs 3.6 on 1/18  See #9  Cont to monitor 17. Acute on chronic anemia  Hb 9.6 on 1/18 18. Fever:   Afebrile x24 hours  UA negative, urine culture negative  Blood cultures no growth to date on 1/20  Chest x-ray on 1/19 reviewed, showing stable opacity, will discuss with Heme/Onc given intermittent feveres  LOS: 8 days A FACE TO FACE EVALUATION WAS PERFORMED  [name] [name] Lorie Phenix  09/11/2019, 8:27 AM

## 2019-09-11 NOTE — Progress Notes (Signed)
Physical Therapy Session Note  Patient Details  Name: [name] [name] MRN: [id number] Date of Birth: 10/21/1955  Today's Date: 09/11/2019 PT Individual Time: 1300-1400 PT Individual Time Calculation (min): 60 min   Short Term Goals: Week 1:  PT Short Term Goal 1 (Week 1): Patient will perform bed mobility with min A. PT Short Term Goal 1 - Progress (Week 1): Met PT Short Term Goal 2 (Week 1): Patient will perform basic transfers with min A using LRAD. PT Short Term Goal 2 - Progress (Week 1): Met PT Short Term Goal 3 (Week 1): Patient will ambulate >20 feet using LRAD. PT Short Term Goal 3 - Progress (Week 1): Met PT Short Term Goal 4 (Week 1): Patient will propel w/c >50 feet using R hemi technique. PT Short Term Goal 4 - Progress (Week 1): Met Week 2:  PT Short Term Goal 1 (Week 2): Pt will perform bed<>chair transfer with LRAD CGA PT Short Term Goal 2 (Week 2): Pt will ambulate 77f with LRAD CGA PT Short Term Goal 3 (Week 2): Pt will perform car transfer with LRAD CGA  Skilled Therapeutic Interventions/Progress Updates:   Received pt sitting in WC, pt agreeable to therapy, and denied any pain throughout session. [facility] AFO fitting during session. Session focused on functional mobility/transfers, LE strength, ambulation, balance/coordination/motor planning, NMR, and improved activity tolerance. Pt reported urge to use restroom and transferred stand<>pivot WC<>toilet mod A with grab bars. Pt required min A for LE clothing management. Pt washed R UE seated at sink with supervision. Pt transported to therapy gym for time management purposes. Pt ambulated 246fwith RW mod A without AFO. Pt demonstrated decreased L LE foot clearance, L toe drag, and mild L knee hyperextension. Therapist [facility] ambulated 5760fith RW min/mod A with increased L LE foot clearance, heel strike, and decreased hyperextension. Pt stated "this brace feels so much  better." Pt transferred stand<>pivot with RW x 2 trials throughout session. Pt performed standing mini squats with RW 2x5 with verbal cues for technique and manual stabilization to maintain L UE on RW. Pt performed LLE toe taps on 1.5in step 2x12 and RLE toe taps on 3in step 2x12 with emphasis on L LE motor control and weight acceptance. Pt with increased fatigue after activity. Concluded session with pt sitting in WC, needs within reach, and seatbelt alarm on.   Therapy Documentation Precautions:  Precautions Precautions: Fall Precaution Comments: L hemiparesis Restrictions Weight Bearing Restrictions: No   Therapy/Group: Individual Therapy AnnAlfonse Alpers, DPT   09/11/2019, 7:35 AM
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
## 2019-09-11 NOTE — Progress Notes (Signed)
Occupational Therapy Session Note  Patient Details  Name: [name] [name] MRN: [id number] Date of Birth: 1956-04-10  Today's Date: 09/11/2019 OT Individual Time: [id number] OT Individual Time Calculation (min): 45 min    Short Term Goals: Week 1:  OT Short Term Goal 1 (Week 1): Pt will recall hemi dressing techniques wiht no VC to don shirt OT Short Term Goal 2 (Week 1): Pt will transfer to toilet with MIN A and LRAD OT Short Term Goal 3 (Week 1): Pt will manage LUE prior o transfers to demo improved L attention  Skilled Therapeutic Interventions/Progress Updates:    Treatment session with focus on functional transfers, awareness of and positioning of LUE, and LUE NMR.  Pt received supine in bed expressing desire to eat first.  Completed bed mobility with min-mod assist and stand pivot transfer min assist to w/c.  Therapist opened containers and pt able to open seasoning packets with increased time, with single hand.  Educated on improved positioning of LUE, even when not in use to increase body awareness and decrease UE "hanging" as pt often with decreased awareness of LUE position.  Utilized UE Ranger with focus on increased shoulder and elbow activation.  Pt able to initiate movement against gravity, requiring manual facilitation from therapist when moving through slightly increased plane.  Pt remained upright in w/c with seat belt alarm on, lap tray for UE positioning on, and all needs in reach.  Therapy Documentation Precautions:  Precautions Precautions: Fall Precaution Comments: L hemiparesis Restrictions Weight Bearing Restrictions: No Pain:  Pt with c/o pain in Lt shoulder/traps and Lt knee.  RN notified.  Applied muscle rub to both locations.   Therapy/Group: Individual Therapy  Simonne Come 09/11/2019, 12:11 PM

## 2019-09-11 NOTE — Progress Notes (Signed)
Team Conference Report to Patient/Family  Team Conference discussion was reviewed with the patient, including goals, any changes in plan of care and target discharge date 09/21/19.  Patient expressed an understanding and is  in agreement.  The patient understands his son needs to come in before discharge for education and will discuss this with his son and let us know what date will work best for family education.Recommended HH follow up and DME order placed.  Dorien Chihuahua B 09/11/2019, 3:52 PM

## 2019-09-11 NOTE — Progress Notes (Signed)
Speech Language Pathology Daily Session Note  Patient Details  Name: [name] [name] MRN: [id number] Date of Birth: [birth year]  Today's Date: 09/11/2019 SLP Individual Time: [id number] SLP Individual Time Calculation (min): 42 min  Short Term Goals: Week 2: SLP Short Term Goal 1 (Week 2): Patient will demonstrate complex problem solving for functional and familiar tasks with supervision level verbal cues. SLP Short Term Goal 2 (Week 2): Patient will recall new, daily information with Mod I for use of memory compensatory strategies.  Skilled Therapeutic Interventions: Skilled ST services focused on cognitive skills. Pt demonstrated recall of yesterdays SLP events with supervision A verbal cues. SLP instructed pt in thought organization strategies, such as note taking and breaking down sentences. SLP facilitated complex problem solving and organizational thought skills in familiar deductive reasoning tasks, pt required supervision A verbal cues for problem solving and mod A fade to min A verbal cues (3/4th way through task) for use of strategies to aid in thought organization. Pt was left in room with call bell within reach and chair alarm set. ST recommends to continue skilled ST services.      Pain Pain Assessment Pain Scale: 0-10 Pain Score: 0-No pain  Therapy/Group: Individual Therapy  Shaan Rhoads  [facility] 09/11/2019, 9:49 AM

## 2019-09-12 ENCOUNTER — Inpatient Hospital Stay (HOSPITAL_COMMUNITY): Payer: Medicare Other | Admitting: *Deleted

## 2019-09-12 ENCOUNTER — Inpatient Hospital Stay (HOSPITAL_COMMUNITY): Payer: Medicare Other

## 2019-09-12 ENCOUNTER — Inpatient Hospital Stay (HOSPITAL_COMMUNITY): Payer: Medicare Other | Admitting: Occupational Therapy

## 2019-09-12 ENCOUNTER — Inpatient Hospital Stay (HOSPITAL_COMMUNITY): Payer: Medicare Other | Admitting: Speech Pathology

## 2019-09-12 DIAGNOSIS — Z8679 Personal history of other diseases of the circulatory system: Secondary | ICD-10-CM

## 2019-09-12 LAB — GLUCOSE, CAPILLARY: Glucose-Capillary: 107 mg/dL — ABNORMAL HIGH (ref 70–99)

## 2019-09-12 NOTE — Evaluation (Signed)
Recreational Therapy Assessment and Plan  Patient Details  Name: [name] [name] MRN: [id number] Date of Birth: Feb 28, 1956 Today's Date: 09/12/2019  Rehab Potential:   Good ELOS:   d/c 1/30  Assessment  Problem List:      Patient Active Problem List   Diagnosis Date Noted  . Acute on chronic anemia   . Lymphopenia   . Hypoalbuminemia due to protein-calorie malnutrition ([place])   . Controlled type 2 diabetes mellitus with hyperglycemia, without long-term current use of insulin ([place])   . Right pontine CVA ([place]) 09/03/2019  . Acute ischemic stroke ([place]) 08/29/2019  . Hyponatremia 08/29/2019  . HTN (hypertension) 08/29/2019  . DM2 (diabetes mellitus, type 2) ([place]) 08/29/2019  . CLL (chronic lymphocytic leukemia) ([place]) 04/10/2019  . Counseling regarding advance care planning and goals of care 04/10/2019  . Deviated septum 05/18/2018  . Iron deficiency anemia 02/26/2018    Past Medical History:      Past Medical History:  Diagnosis Date  . Allergic rhinitis, cause unspecified   . Backache, unspecified   . Body mass index 33.0-33.9, adult   . Diabetes mellitus without complication ([place])    Type II  . Elevated blood pressure reading without diagnosis of hypertension   . Esophageal reflux   . Generalized pain   . Heartburn   . Insomnia, unspecified   . 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 Other abnormal blood chemistry   . Other and unspecified hyperlipidemia   . Other dyspnea and respiratory abnormality   . Other nonspecific findings on examination of blood(790.99)   . Pain in joint, lower leg   . Pneumonia    1968  . Sleep apnea    Does not use or own a CPAP  . Tobacco use disorder   . Unspecified essential hypertension    Past Surgical History:       Past Surgical History:  Procedure Laterality Date  . bil wrist surgery    . ENDOSCOPIC CONCHA BULLOSA RESECTION Bilateral 05/18/2018    Procedure: ENDOSCOPIC CONCHA BULLOSA RESECTION;  Surgeon: Jerrell Belfast, MD;  Location: [facility];  Service: ENT;  Laterality: Bilateral;  . NASAL SEPTOPLASTY W/ TURBINOPLASTY Bilateral 05/18/2018   Procedure: NASAL SEPTOPLASTY WITH TURBINATE REDUCTION;  Surgeon: Jerrell Belfast, MD;  Location: [facility];  Service: ENT;  Laterality: Bilateral;  . ORIF FOREARM FRACTURE     right  . plastic surgery to face    . SINUS ENDO W/FUSION Bilateral 05/18/2018   Procedure: ENDOSCOPIC SINUS SURGERY WITH NAVIGATION;  Surgeon: Jerrell Belfast, MD;  Location: [facility];  Service: ENT;  Laterality: Bilateral;    Assessment & Plan Clinical Impression: Patient is a 64 y.o. year old right-handed male with history of diabetes mellitus (a1c is 5.9), chronic hyponatremia, hypertension, remote tobacco use, CLL/small B-cell lymphoma followed by Dr.Kale with last chemotherapy October 2020. Per chart review patient lives with a 28 year old mother. Independent prior to admission. 1 level home with ramped entrance. Mother has an aide that assists her for a couple of hours daily Patient reports being on disability due to [place] in early 2000s.. Presented 1/7/2021with left-sided weakness and mild dysarthria. Admission labs with hemoglobin 9.8, sodium 128, urine drug screen negative, SARS coronavirus negative, hemoglobin 9.8, hematocrit 28.8, platelet 310,000, WBC 4.3. Cranial CT scan unremarkable. Patient did not receive TPA. 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 CT angiogram of head and neck with no emergent large vessel occlusion or high-grade stenosis. CT  of chest abdomen pelvis showed improving lymphadenopathy in the chest abdomen pelvis. Echocardiogram with ejection fraction 65% without emboli. Neurology follow-up currently maintained on Plavix for CVA prophylaxis. Patient was placed in [employer] XIinhibitor stroke prevention trial.Subcutaneous Lovenox for DVT prophylaxis.  Persistent hyponatremia 126-130 and initially maintained on normal saline. Tolerating a regular consistency diet. Therapy evaluations completed and patient was admitted for a comprehensive rehab program.  Patient transferred to CIR on 09/03/2019.   Pt presents with decreased activity tolerance, decreased functional mobility, decreased balance, decreased coordination, decreased vision, left inattention Limiting pt's independence with leisure/community pursuits.   Plan Min 1 TR session >20 minutes per week   Recommendations for other services: None   Discharge Criteria: Patient will be discharged from TR if patient refuses treatment 3 consecutive times without medical reason.  If treatment goals not met, if there is a change in medical status, if patient makes no progress towards goals or if patient is discharged from hospital.  The above assessment, treatment plan, treatment alternatives and goals were discussed and mutually agreed upon: by patient  [name] [name] 09/12/2019, 12:02 PM

## 2019-09-12 NOTE — Progress Notes (Signed)
Speech Language Pathology Daily Session Note  Patient Details  Name: [name] [name] MRN: [id number] Date of Birth: Jul 03, 1956  Today's Date: 09/12/2019 SLP Individual Time: 0720-0820 SLP Individual Time Calculation (min): 60 min  Short Term Goals: Week 2: SLP Short Term Goal 1 (Week 2): Patient will demonstrate complex problem solving for functional and familiar tasks with supervision level verbal cues. SLP Short Term Goal 2 (Week 2): Patient will recall new, daily information with Mod I for use of memory compensatory strategies.  Skilled Therapeutic Interventions:  Skilled treatment session targeted cognition goals. Pt was sleeping when SLP entered room. Pt required extra time to arouse d/t report of increase fatigue from PT sessions on previous day. Pt very agreeable to therapy session and able to engage after time given to arouse. SLP facilitated session by engaging pt in game of checkers to provide multi-modal alertness. Rules reviewed prior to starting game, however pt very frustrated in game he felt "unspoken rules' had not been followed. Pt easily frustrated, however once rules were researched on [employer], pt agreeable to continuing to play. He had not completed the activities left for him by SLP on previous date d/t to fatigue, so pt was left with that activity in hand. Pt very appreciative of services at end of session.      Pain    Therapy/Group: Individual Therapy  Onix Jumper 09/12/2019, 8:31 AM

## 2019-09-12 NOTE — Progress Notes (Signed)
Physical Therapy Session Note  Patient Details  Name: [name] [name] MRN: [id number] Date of Birth: November 16, 1955  Today's Date: 09/12/2019 PT Individual Time: 0900-1000 PT Individual Time Calculation (min): 60 min   Short Term Goals: Week 1:  PT Short Term Goal 1 (Week 1): Patient will perform bed mobility with min A. PT Short Term Goal 1 - Progress (Week 1): Met PT Short Term Goal 2 (Week 1): Patient will perform basic transfers with min A using LRAD. PT Short Term Goal 2 - Progress (Week 1): Met PT Short Term Goal 3 (Week 1): Patient will ambulate >20 feet using LRAD. PT Short Term Goal 3 - Progress (Week 1): Met PT Short Term Goal 4 (Week 1): Patient will propel w/c >50 feet using R hemi technique. PT Short Term Goal 4 - Progress (Week 1): Met Week 2:  PT Short Term Goal 1 (Week 2): Pt will perform bed<>chair transfer with LRAD CGA PT Short Term Goal 2 (Week 2): Pt will ambulate 62f with LRAD CGA PT Short Term Goal 3 (Week 2): Pt will perform car transfer with LRAD CGA  Skilled Therapeutic Interventions/Progress Updates:   Received pt supine in bed, pt agreeable to therapy, and denied any pain during session, but stated he had a headache; RN made aware. Session focused on functional mobility/transfers, dressing, toileting, ambulation, LE strength, balance/coordination/motor planning, NMR, and improved endurance with activity. Pt performed bed mobility with supervision and increased time. Pt transferred stand<>pivot bed<>WC min A without AD. Pt performed stand<>pivot WC<>toilet mod A without AD. Pt continent of bowel and therapist donned incontinence brief and pants mod A and pt required mod A to pull pants over hips. Pt required total assist for hygiene management while standing. Therapist donned L AFO and shoes max A for time management purposes. [facility] Pt ambulated 255fx 3 trials mod A with RW. Pt required tactile cues to maintain L UE on  RW and verbal cues for L hip flexion, turning sequence, and RW safety. Pt with decreased L hip flexion during today's session resulting in increased L toe drag. Pt reported feeling weaker today despite having slept well last night. Therapist reassured pt that some days are better than others however, pt determined to continue walking. Therapist encouraged multiple rest breaks throughout session and educated pt on importance of not "overdoing it". Concluded session with pt sitting in WC, needs within reach, and seatbelt alarm on. Therapist provided drinks for patient.   Therapy Documentation Precautions:  Precautions Precautions: Fall Precaution Comments: L hemiparesis Restrictions Weight Bearing Restrictions: No  Therapy/Group: Individual Therapy AnAlfonse AlpersT, DPT   09/12/2019, 7:34 AM

## 2019-09-12 NOTE — Progress Notes (Signed)
Physical Therapy Session Note  Patient Details  Name: [name] [name] MRN: [id number] Date of Birth: 08-03-1956  Today's Date: 09/12/2019 PT Individual Time: [id number] PT Individual Time Calculation (min): 38 min   Short Term Goals: Week 2:  PT Short Term Goal 1 (Week 2): Pt will perform bed<>chair transfer with LRAD CGA PT Short Term Goal 2 (Week 2): Pt will ambulate 39ft with LRAD CGA PT Short Term Goal 3 (Week 2): Pt will perform car transfer with LRAD CGA  Skilled Therapeutic Interventions/Progress Updates:   Pt very lethargic upon arrival and first part of session. Difficult to arouse. Declines any pain or feeling unwell, just fatigue. Utilized [employer] for [employer] to address balance, attention, and postural control re-training. Pt initially performed dynavision in seated position and then progressed to 2 trials in standing to increase challenge. Pt requires mod assist for sit <> stands with facilitation for anterior weightshift and cues for hand placement and technique. Total assist for management of LUE on orthosis, and up to heavy mod assist for dynamic balance due to strong L lateral lean and poor awareness of self in space. Pt request to use bathroom performed toileting in standing for urination with mod assist overall and assist needed for clothing management. Mod assist needed again for balance without BUE support due to L lateral lean and impaired postural control. Performed hand hygiene at sink from w/c level.   Therapy Documentation Precautions:  Precautions Precautions: Fall Precaution Comments: L hemiparesis Restrictions Weight Bearing Restrictions: No   Pain:  Denies pain. Just reports significant fatigue.    Therapy/Group: Individual Therapy  Canary Brim Ivory Broad, PT, DPT, CBIS  09/12/2019, 12:13 PM

## 2019-09-12 NOTE — Progress Notes (Signed)
Occupational Therapy Session Note  Patient Details  Name: [name] [name] MRN: [id number] Date of Birth: 03-Sep-1955  Today's Date: 09/12/2019 OT Individual Time: [id number] OT Individual Time Calculation (min): 42 min    Short Term Goals: Week 2:  OT Short Term Goal 1 (Week 2): Pt will recall hemi dressing techniques wiht no VC to don shirt OT Short Term Goal 2 (Week 2): Pt will manage LUE prior to transfers to demo improved L attention OT Short Term Goal 3 (Week 2): Pt will complete toilet transfer with Min assist, ambulating with RW OT Short Term Goal 4 (Week 2): Pt will complete LB dressing with min assist (minus Lt shoe/AFO)  Skilled Therapeutic Interventions/Progress Updates:    Pt completed transfer from the wheelchair to the therapy mat with min assist stand pivot.  He was able to work on lateral weight shifts in sitting with the LUE in weightbearing.  He needed max instructional cueing with mod facilitation to maintain midline orientation, although he maintains sitting balance statically with supervision.  He tends to exhibit left trunk passive elongation with head flexion and cervical lateral tilt to the right.  Began with trunk mobilizations to promote left trunk shortening and activation.  Progressed to having him sit in front of a mirror and reach to place bean bags with the RUE laterally to the right to promote active left trunk shortening.  Max facilitation needed at the left elbow to maintain LUE in weightbearing while reaching with the right.  Finished session with transfer back to the wheelchair with min assist.  Returned to the room at the end of the session with pt choosing to stay up in the wheelchair with half lap tray in place and safety belt on.  Call button and phone in reach as well.    Therapy Documentation Precautions:  Precautions Precautions: Fall Precaution Comments: L hemiparesis Restrictions Weight Bearing Restrictions: No   Pain: Pain Assessment Pain Scale:  Faces Pain Score: 0-No pain ADL: See Care Tool Section for some details of mobility and selfcare  Therapy/Group: Individual Therapy  Andraya Frigon OTR/L 09/12/2019, 4:28 PM

## 2019-09-12 NOTE — Progress Notes (Signed)
Pt c/o headache, MD request Tylenol to be held to see if temp holds off. Will see if something else possibly available.

## 2019-09-12 NOTE — Progress Notes (Signed)
[place] PHYSICAL MEDICINE & REHABILITATION PROGRESS NOTE  Subjective/Complaints: Patient seen laying in bed this morning.  He states he slept very well overnight.  He denies shoulder or knee discomfort.  He denies any respiratory issues.  He is eager for therapies, and asking for extra sessions if available.  Discussed findings of opacity on chest x-ray with patient.  ROS: Denies cough, drainage, CP, shortness of breath, nausea, vomiting, diarrhea.  Objective: Vital Signs: Blood pressure 134/75, pulse 62, temperature 99.1 F (37.3 C), temperature source Oral, resp. rate 19, height 5\' 7"  (1.702 m), weight 73.8 kg, SpO2 98 %. DG Chest 2 View  Result Date: 09/10/2019 CLINICAL DATA:  Fevers EXAM: CHEST - 2 VIEW COMPARISON:  08/29/2019 FINDINGS: Persistent opacification of the medial aspect of the right lung base is noted. No new focal abnormality is seen. Cardiac shadow is stable. The lungs are otherwise clear. Bony structures are within normal limits. IMPRESSION: Stable opacity in the medial aspect of the right lung base similar to that seen on prior exam. Electronically Signed   By: Inez Catalina M.D.   On: 09/10/2019 10:52   Recent Labs    09/09/19 0914  WBC 3.6*  HGB 9.6*  HCT 28.4*  PLT 344   Recent Labs    09/09/19 0914  NA 133*  K 3.5  CL 98  CO2 24  GLUCOSE 154*  BUN 12  CREATININE 0.94  CALCIUM 9.7    Physical Exam: BP 134/75   Pulse 62   Temp 99.1 F (37.3 C) (Oral)   Resp 19   Ht 5\' 7"  (1.702 m)   Wt 73.8 kg   SpO2 98%   BMI 25.48 kg/m   Constitutional: No distress . Vital signs reviewed. HENT: Normocephalic.  Atraumatic. Eyes: EOMI. No discharge. Cardiovascular: No JVD. Respiratory: Normal effort.  No stridor. GI: Non-distended. Skin: Warm and dry.  Intact. Psych: Normal mood.  Normal behavior. Musc: No edema in extremities.  No tenderness in extremities. Neuro: Alert Motor:  LUE: shoulder abduction 1+/5, 0/5 distally, unchanged LLE: Hip flexion,  knee extension 1+/5, ankle dorsiflexion 0/5, unchanged  Assessment/Plan: 1. Functional deficits secondary to right paramedian pontine infarct which require 3+ hours per day of interdisciplinary therapy in a comprehensive inpatient rehab setting.  Physiatrist is providing close team supervision and 24 hour management of active medical problems listed below.  Physiatrist and rehab team continue to assess barriers to discharge/monitor patient progress toward functional and medical goals  Care Tool:  Bathing    Body parts bathed by patient: Left arm, Chest, Abdomen, Front perineal area, Right upper leg, Left upper leg, Right lower leg, Left lower leg, Face   Body parts bathed by helper: Right arm, Buttocks     Bathing assist Assist Level: Minimal Assistance - Patient > 75%     Upper Body Dressing/Undressing Upper body dressing   What is the patient wearing?: Pull over shirt    Upper body assist Assist Level: Moderate Assistance - Patient 50 - 74%    Lower Body Dressing/Undressing Lower body dressing      What is the patient wearing?: Incontinence brief, Pants     Lower body assist Assist for lower body dressing: Moderate Assistance - Patient 50 - 74%     Toileting Toileting Toileting Activity did not occur (Clothing management and hygiene only): N/A (no void or bm)  Toileting assist Assist for toileting: Moderate Assistance - Patient 50 - 74% Assistive Device Comment: urinal   Transfers Chair/bed transfer  Transfers assist  Chair/bed transfer activity did not occur: Safety/medical concerns  Chair/bed transfer assist level: Minimal Assistance - Patient > 75%     Locomotion Ambulation   Ambulation assist      Assist level: Moderate Assistance - Patient 50 - 74% Assistive device: Walker-rolling Max distance: 24ft   Walk 10 feet activity   Assist  Walk 10 feet activity did not occur: Safety/medical concerns(decreased strength/activity tolerance)  Assist  level: Moderate Assistance - Patient - 50 - 74% Assistive device: Walker-rolling, Orthosis   Walk 50 feet activity   Assist Walk 50 feet with 2 turns activity did not occur: Safety/medical concerns(decreased strength/activity tolerance)  Assist level: Moderate Assistance - Patient - 50 - 74% Assistive device: Walker-rolling, Orthosis    Walk 150 feet activity   Assist Walk 150 feet activity did not occur: Safety/medical concerns(decreased strength/activity tolerance)         Walk 10 feet on uneven surface  activity   Assist Walk 10 feet on uneven surfaces activity did not occur: Safety/medical concerns(decreased strength/activity tolerance)         Wheelchair     Assist Will patient use wheelchair at discharge?: Yes Type of Wheelchair: Manual Wheelchair activity did not occur: Safety/medical concerns(Unable without skilled intervention, L hemi)  Wheelchair assist level: Supervision/Verbal cueing Max wheelchair distance: 125ft    Wheelchair 50 feet with 2 turns activity    Assist    Wheelchair 50 feet with 2 turns activity did not occur: (Unable without skilled intervention, L hemi)   Assist Level: Supervision/Verbal cueing   Wheelchair 150 feet activity     Assist Wheelchair 150 feet activity did not occur: (Unable without skilled intervention, L hemi)   Assist Level: Supervision/Verbal cueing      Medical Problem List and Plan: 1.  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  Antithrombotics: -DVT/anticoagulation: Lovenox             -antiplatelet therapy: Plavix 75 mg daily patient placed in [employer] factor XI inhibitor stroke prevention trial 3. Pain Management: Tylenol as needed  Voltaren gel changed to muscle rub right shoulder after discussion with neurology 4. Mood: Provide emotional support             -antipsychotic agents:  N/A 5. Neuropsych: This patient is capable of making decisions on his own behalf. 6. Skin/Wound Care: Routine skin checks 7. Fluids/Electrolytes/Nutrition: Routine in and outs 8.  Chronic hyponatremia.  Low sodium in September through November 2020.    IVFs 100cc/hr d/ced  Na 132 on 1/18 9.  CLL/small B-cell lymphoma.  Follow-up Dr.Kale.  Patient last received chemotherapy October 2020. 10.  Type 2 diabetes mellitus with hyperglycemia.  Hemoglobin A1c 5.9.  Glucophage currently held.  Continue sliding scale  Controlled on 1/21  Monitor with increased mobility 11.  Hyperlipidemia.  Lipitor 12.  History of hypertension.    On no medications at present  Controlled on 1/21  Monitor with increased mobility 13. Mild Spasticity:  Cont to monitor  No need for medications at present 14. OSA: refuses to wear CPAP 15. Hypoalbuminemia  Supplement initiated on 1/13 16. Leukopenia  WBCs 3.6 on 1/18  See #9  Cont to monitor 17. Acute on chronic anemia  Hb 9.6 on 1/18 18. Fever:   Afebrile x 48 hours, however has been receiving Tylenol-we will hold  UA negative, urine culture negative  Blood cultures no growth to  date on 1/21  Chest x-ray on 1/19 reviewed, showing stable opacity, prolonged discussion with Heme/Onc regarding lymphoma and opacity-suggesting central origin.  Will continue to monitor for time being.  Patient has persistent fevers will consider work-up for atypical pneumonia. 19.  Sleep disturbance  Ambien 10  LOS: 9 days A FACE TO FACE EVALUATION WAS PERFORMED  Coti Burd Lorie Phenix 09/12/2019, 8:17 AM

## 2019-09-13 ENCOUNTER — Inpatient Hospital Stay (HOSPITAL_COMMUNITY): Payer: Medicare Other | Admitting: Speech Pathology

## 2019-09-13 ENCOUNTER — Inpatient Hospital Stay (HOSPITAL_COMMUNITY): Payer: Medicare Other | Admitting: Occupational Therapy

## 2019-09-13 ENCOUNTER — Inpatient Hospital Stay (HOSPITAL_COMMUNITY): Payer: Medicare Other

## 2019-09-13 DIAGNOSIS — G479 Sleep disorder, unspecified: Secondary | ICD-10-CM

## 2019-09-13 LAB — GLUCOSE, CAPILLARY: Glucose-Capillary: 121 mg/dL — ABNORMAL HIGH (ref 70–99)

## 2019-09-13 NOTE — Progress Notes (Signed)
Therapy reports increased weakness on left side as well as increased lean.  Orders to check CT of head

## 2019-09-13 NOTE — Progress Notes (Signed)
[place] PHYSICAL MEDICINE & REHABILITATION PROGRESS NOTE  Subjective/Complaints: Patient seen laying in bed this morning.  He states he slept well overnight.  He again denies any respiratory symptoms.  ROS: Denies cough, drainage, CP, shortness of breath, nausea, vomiting, diarrhea.  Objective: Vital Signs: Blood pressure (!) 144/73, pulse 71, temperature 99.6 F (37.6 C), resp. rate 17, height 5\' 7"  (1.702 m), weight 73.8 kg, SpO2 97 %. No results found. No results for input(s): WBC, HGB, HCT, PLT in the last 72 hours. No results for input(s): NA, K, CL, CO2, GLUCOSE, BUN, CREATININE, CALCIUM in the last 72 hours.  Physical Exam: BP (!) 144/73   Pulse 71   Temp 99.6 F (37.6 C)   Resp 17   Ht 5\' 7"  (1.702 m)   Wt 73.8 kg   SpO2 97%   BMI 25.48 kg/m   Constitutional: No distress . Vital signs reviewed. HENT: Normocephalic.  Atraumatic. Eyes: EOMI. No discharge. Cardiovascular: No JVD. Respiratory: Normal effort.  No stridor. GI: Non-distended. Skin: Warm and dry.  Intact. Psych: Normal mood.  Normal behavior. Musc: No edema in extremities.  No tenderness in extremities. Neuro: Alert Motor:  LUE: shoulder abduction 1+/5, 0/5 distally, stable LLE: Hip flexion, knee extension 1+/5, ankle dorsiflexion 0/5,?  Slight improvement No increase in tone appreciated  Assessment/Plan: 1. Functional deficits secondary to right paramedian pontine infarct which require 3+ hours per day of interdisciplinary therapy in a comprehensive inpatient rehab setting.  Physiatrist is providing close team supervision and 24 hour management of active medical problems listed below.  Physiatrist and rehab team continue to assess barriers to discharge/monitor patient progress toward functional and medical goals  Care Tool:  Bathing    Body parts bathed by patient: Left arm, Chest, Abdomen, Front perineal area, Right upper leg, Left upper leg, Right lower leg, Left lower leg, Face   Body  parts bathed by helper: Right arm, Buttocks     Bathing assist Assist Level: Minimal Assistance - Patient > 75%     Upper Body Dressing/Undressing Upper body dressing   What is the patient wearing?: Pull over shirt    Upper body assist Assist Level: Moderate Assistance - Patient 50 - 74%    Lower Body Dressing/Undressing Lower body dressing      What is the patient wearing?: Incontinence brief, Pants     Lower body assist Assist for lower body dressing: Moderate Assistance - Patient 50 - 74%     Toileting Toileting Toileting Activity did not occur Landscape architect and hygiene only): N/A (no void or bm)  Toileting assist Assist for toileting: Maximal Assistance - Patient 25 - 49% Assistive Device Comment: urinal   Transfers Chair/bed transfer  Transfers assist  Chair/bed transfer activity did not occur: Safety/medical concerns  Chair/bed transfer assist level: Minimal Assistance - Patient > 75%     Locomotion Ambulation   Ambulation assist      Assist level: Moderate Assistance - Patient 50 - 74% Assistive device: Walker-rolling Max distance: 68ft   Walk 10 feet activity   Assist  Walk 10 feet activity did not occur: Safety/medical concerns(decreased strength/activity tolerance)  Assist level: Moderate Assistance - Patient - 50 - 74% Assistive device: Walker-rolling, Orthosis   Walk 50 feet activity   Assist Walk 50 feet with 2 turns activity did not occur: Safety/medical concerns(decreased strength/activity tolerance)  Assist level: Moderate Assistance - Patient - 50 - 74% Assistive device: Walker-rolling, Orthosis    Walk 150 feet activity   Assist  Walk 150 feet activity did not occur: Safety/medical concerns(decreased strength/activity tolerance)         Walk 10 feet on uneven surface  activity   Assist Walk 10 feet on uneven surfaces activity did not occur: Safety/medical concerns(decreased strength/activity tolerance)          Wheelchair     Assist Will patient use wheelchair at discharge?: Yes Type of Wheelchair: Manual Wheelchair activity did not occur: Safety/medical concerns(Unable without skilled intervention, L hemi)  Wheelchair assist level: Supervision/Verbal cueing Max wheelchair distance: 126ft    Wheelchair 50 feet with 2 turns activity    Assist    Wheelchair 50 feet with 2 turns activity did not occur: (Unable without skilled intervention, L hemi)   Assist Level: Supervision/Verbal cueing   Wheelchair 150 feet activity     Assist Wheelchair 150 feet activity did not occur: (Unable without skilled intervention, L hemi)   Assist Level: Supervision/Verbal cueing      Medical Problem List and Plan: 1.  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  Antithrombotics: -DVT/anticoagulation: Lovenox             -antiplatelet therapy: Plavix 75 mg daily patient placed in [employer] factor XI inhibitor stroke prevention trial 3. Pain Management: Tylenol as needed  Voltaren gel changed to muscle rub right shoulder after discussion with neurology 4. Mood: Provide emotional support             -antipsychotic agents: N/A 5. Neuropsych: This patient is capable of making decisions on his own behalf. 6. Skin/Wound Care: Routine skin checks 7. Fluids/Electrolytes/Nutrition: Routine in and outs 8.  Chronic hyponatremia.  Low sodium in September through November 2020.    IVFs 100cc/hr d/ced  Na 132 on 1/18, labs ordered for Monday 9.  CLL/small B-cell lymphoma.  Follow-up Dr.Kale.  Patient last received chemotherapy October 2020. 10.  Type 2 diabetes mellitus with hyperglycemia.  Hemoglobin A1c 5.9.  Glucophage currently held.  Continue sliding scale  Relatively controlled on 1/22  Monitor with increased mobility 11.  Hyperlipidemia.  Lipitor 12.  History of hypertension.    On no  medications at present  Relatively controlled on 1/22  Monitor with increased mobility 13. Mild Spasticity:  Cont to monitor  Controlled at present without medication-continue range of motion 14. OSA: refuses to wear CPAP 15. Hypoalbuminemia  Supplement initiated on 1/13 16. Leukopenia  WBCs 3.6 on 1/18, labs ordered for Monday  See #9  Cont to monitor 17. Acute on chronic anemia  Hb 9.6 on 1/18, labs ordered for Monday 18. Fever:   Afebrile x 72 hours, remains afebrile after Tylenol x24 hours  UA negative, urine culture negative  Blood cultures no growth to date on 1/22  Chest x-ray on 1/19 reviewed, showing stable opacity, prolonged discussion with Heme/Onc regarding lymphoma and opacity-suggesting central origin.  Will continue to monitor for time being.  If patient has persistent fevers will consider ID consult/work-up for atypical pneumonia. 19.  Sleep disturbance  Ambien 10  Improving  LOS: 10 days A FACE TO FACE EVALUATION WAS PERFORMED  [name] [name] Lorie Phenix 09/13/2019, 9:04 AM

## 2019-09-13 NOTE — Progress Notes (Signed)
Occupational Therapy Session Note  Patient Details  Name: [name] [name] MRN: [id number] Date of Birth: [birth year]  Today's Date: 09/13/2019 OT Individual Time: 1115-1200 OT Individual Time Calculation (min): 45 min    Short Term Goals: Week 2:  OT Short Term Goal 1 (Week 2): Pt will recall hemi dressing techniques wiht no VC to don shirt OT Short Term Goal 2 (Week 2): Pt will manage LUE prior to transfers to demo improved L attention OT Short Term Goal 3 (Week 2): Pt will complete toilet transfer with Min assist, ambulating with RW OT Short Term Goal 4 (Week 2): Pt will complete LB dressing with min assist (minus Lt shoe/AFO)  Skilled Therapeutic Interventions/Progress Updates:    Treatment session with focus on functional transfers, static and dynamic sitting balance, and awareness of LUE.  Pt received supine in bed asleep. Pt required increased time to fully arouse, stating "I'm just tired".  Completed bed mobility with increased assist, ?fatigue or still effects from sleeping medicine.  Completed stand pivot transfer bed > w/c with mod assist, pt requiring increased assistance for trunk control and to advance LLE.  Transferred in to shower in similar manner requiring increased assist for trunk control.  Noted significant Lt lean in sitting on shower chair, requiring mod -max facilitation to maintain sitting balance.  Pt unable to complete any bathing without losing balance to Lt therefore therapist washed UB and then terminated shower due to concerns about balance.  Pt completed oral care with severe Lt lean in sitting requiring max assist to maintain upright.  Notified RN and PA of concerns.  Returned to bed and completed dressing to don clean brief and shirt while seated EOB with therapist on Lt and pt maintaining grasp on bed rail with RUE.  Returned to supine and left with all needs in reach.  Therapy Documentation Precautions:  Precautions Precautions: Fall Precaution Comments: L  hemiparesis Restrictions Weight Bearing Restrictions: No Pain: Pain Assessment Pain Scale: 0-10 Pain Score: 0-No pain   Therapy/Group: Individual Therapy  Simonne Come 09/13/2019, 12:20 PM

## 2019-09-13 NOTE — Progress Notes (Signed)
Physical Therapy Session Note  Patient Details  Name: [name] [name] MRN: [id number] Date of Birth: 1955-12-02  Today's Date: 09/13/2019 PT Individual Time: 1300-1330 PT Individual Time Calculation (min): 30 min   Short Term Goals: Week 1:  PT Short Term Goal 1 (Week 1): Patient will perform bed mobility with min A. PT Short Term Goal 1 - Progress (Week 1): Met PT Short Term Goal 2 (Week 1): Patient will perform basic transfers with min A using LRAD. PT Short Term Goal 2 - Progress (Week 1): Met PT Short Term Goal 3 (Week 1): Patient will ambulate >20 feet using LRAD. PT Short Term Goal 3 - Progress (Week 1): Met PT Short Term Goal 4 (Week 1): Patient will propel w/c >50 feet using R hemi technique. PT Short Term Goal 4 - Progress (Week 1): Met Week 2:  PT Short Term Goal 1 (Week 2): Pt will perform bed<>chair transfer with LRAD CGA PT Short Term Goal 2 (Week 2): Pt will ambulate 53f with LRAD CGA PT Short Term Goal 3 (Week 2): Pt will perform car transfer with LRAD CGA  Skilled Therapeutic Interventions/Progress Updates:   Received pt supine in bed, pt agreeable to therapy, and denied any pain during session. Pt extremely fatigued during session and stated "I don't feel well today". Session focused on functional mobility/transfers, ambulation, LE strength, and improved endurance with activity. Pt donned pants in supine with max A. Pt rolled to L and R with min A and transferred supine<>sitting EOB min A. Pt with strong L lateral lean today and requiring min/CGA to maintain sitting balance. Pt had eyes closed throughout most of session, and required max verbal cues to maintain eyes open. [place] [facility] [place] however, session limited by fatigue. Therapist donned socks, shoes, and L AFO max A. Pt transferred sit<>stand with RW mod A with continued strong L lateral lean. Pt temporarily able to correct lean but demonstrated poor carry over. Pt attempted to ambulate however was  only able to made it 3 steps demonstrating poor LLE foot clearance and poor motor control/motor planning. Pt returned to bed sit<>supine min A and therapist doffed pants max A. Concluded session with pt supine in bed, needs within reach, and bed alarm on. Pt preparing to leave for CT scan.   Therapy Documentation Precautions:  Precautions Precautions: Fall Precaution Comments: L hemiparesis Restrictions Weight Bearing Restrictions: No  Therapy/Group: Individual Therapy AAlfonse AlpersPT, DPT   09/13/2019, 7:33 AM

## 2019-09-13 NOTE — Progress Notes (Signed)
Occupational Therapy Session Note  Patient Details  Name: [name] [name] MRN: [id number] Date of Birth: 16-Mar-1956  Today's Date: 09/13/2019 OT Individual Time: 1400-1430 OT Individual Time Calculation (min): 30 min  and Today's Date: 09/13/2019 OT Missed Time: 30 Minutes Missed Time Reason: Patient fatigue   Short Term Goals: Week 2:  OT Short Term Goal 1 (Week 2): Pt will recall hemi dressing techniques wiht no VC to don shirt OT Short Term Goal 2 (Week 2): Pt will manage LUE prior to transfers to demo improved L attention OT Short Term Goal 3 (Week 2): Pt will complete toilet transfer with Min assist, ambulating with RW OT Short Term Goal 4 (Week 2): Pt will complete LB dressing with min assist (minus Lt shoe/AFO)  Skilled Therapeutic Interventions/Progress Updates:    Treatment session with focus on functional mobility and arousal.  Pt received in bed having just returned from CT.  As no results in chart yet, completed bed mobility and up to sitting at EOB as a precaution.  Pt requiring frequent cues and stimulus to open eyes and attempt to maintain eyes open.  Pt keeping Lt eye closed mostly and often closing Rt eye as well.  Required mod-max assist for bed mobility to roll into sidelying.  Mod assist sidelying to sitting.  Once upright pt able to maintain midline sitting balance with RUE stabilized on bed rail.  Without UE support, pt with LOB to Lt with inability to correct.  Pt continues to demonstrate severe Lt lean requiring mod-max assist to maintain upright sitting posture.  Due to decreased ability to follow directions to maintain arousal, returned to supine.  Pt left in supine with all needs in reach.  Therapy Documentation Precautions:  Precautions Precautions: Fall Precaution Comments: L hemiparesis Restrictions Weight Bearing Restrictions: No General: General OT Amount of Missed Time: 30 Minutes Vital Signs: Therapy Vitals Temp: 99.7 F (37.6 C) Pulse Rate: 72 Resp:  18 BP: 140/84 Patient Position (if appropriate): Lying Oxygen Therapy SpO2: 100 % O2 Device: Room Air Pain:  Pt with no c/o pain   Therapy/Group: Individual Therapy  Simonne Come 09/13/2019, 2:59 PM

## 2019-09-13 NOTE — Progress Notes (Signed)
Speech Language Pathology Daily Session Note  Patient Details  Name: [name] [name] MRN: [id number] Date of Birth: [birth year]  Today's Date: 09/13/2019 SLP Individual Time: [id number] SLP Individual Time Calculation (min): 10 min  Short Term Goals: Week 2: SLP Short Term Goal 1 (Week 2): Patient will demonstrate complex problem solving for functional and familiar tasks with supervision level verbal cues. SLP Short Term Goal 2 (Week 2): Patient will recall new, daily information with Mod I for use of memory compensatory strategies.  Skilled Therapeutic Interventions:     Skilled treatment session was limited by pt fatigue. Pt able to arouse for brief periods to recall activities in previous day's therapy session. Pt able to recall with Min A cues. Pt missed 50 minutes of therapy as a result of sleepiness.   Pain Pain Assessment Pain Scale: 0-10 Pain Score: 0-No pain  Therapy/Group: Individual Therapy  Trajon Rosete 09/13/2019, 12:52 PM

## 2019-09-14 LAB — CULTURE, BLOOD (ROUTINE X 2)
Culture: NO GROWTH
Culture: NO GROWTH

## 2019-09-14 LAB — GLUCOSE, CAPILLARY: Glucose-Capillary: 109 mg/dL — ABNORMAL HIGH (ref 70–99)

## 2019-09-14 NOTE — Progress Notes (Signed)
  Patient ID: [name] [name], male   DOB: 1955-09-07, 64 y.o.   MRN: [id number]     Diagnosis codes:  I69.354  Height:  5'7"        Weight:  171 lbs          Patient suffers from hemiplegia following a stroke which impairs their ability to perform daily activities like bathing, dressing and toileting  in the home.  A walker will not resolve issue with performing activities of daily living.  A wheelchair will allow patient to safely perform daily activities.  Patient is not able to propel themselves in the home using a standard weight wheelchair due to hemiplegia and weakness.  Patient can self propel in the lightweight wheelchair.  Lauraine Rinne, PA-C

## 2019-09-14 NOTE — Progress Notes (Signed)
Pt has been extremely fatigued today and slept a lot of the day. Pt has had intermittent bouts of disorientation as well. Pt did not remember completing therapies. Pt did not remember having CT scan yesterday. Pt running low grade temp still and was shivering this afternoon. Will continue to monitor.

## 2019-09-14 NOTE — Progress Notes (Signed)
Patient awake during nursing rounds. Able to answer questions. Alert and oriented x4 but thinks its daytime. Current temperature 100.3, no complaints at this time. No shivering observed. Will continue to monitor.

## 2019-09-14 NOTE — Progress Notes (Signed)
[place] PHYSICAL MEDICINE & REHABILITATION PROGRESS NOTE  Subjective/Complaints: No new issues. States he slept well. Had no new questions or complaints  ROS: Patient denies fever, rash, sore throat, blurred vision, nausea, vomiting, diarrhea, cough, shortness of breath or chest pain, joint or back pain, headache, or mood change.    Objective: Vital Signs: Blood pressure 132/78, pulse 78, temperature 99.6 F (37.6 C), resp. rate 20, height 5\' 7"  (1.702 m), weight 73.8 kg, SpO2 96 %. CT HEAD WO CONTRAST  Result Date: 09/13/2019 CLINICAL DATA:  Stroke, follow-up, type II diabetes mellitus, hypertension EXAM: CT HEAD WITHOUT CONTRAST TECHNIQUE: Contiguous axial images were obtained from the base of the skull through the vertex without intravenous contrast. Sagittal and coronal MPR images reconstructed from axial data set. COMPARISON:  08/29/2019, MR brain 08/30/2019 FINDINGS: Brain: Normal ventricular morphology. No midline shift or mass effect. Normal appearance of brain parenchyma. No intracranial hemorrhage, mass lesion, evidence of acute infarction, or extra-axial fluid collection. RIGHT pontine infarct identified by prior MR not identified on current exam. Vascular: No hyperdense vessels Skull: Intact Sinuses/Orbits: Clear Other: N/A IMPRESSION: No acute intracranial abnormalities. Electronically Signed   By: Lavonia Dana M.D.   On: 09/13/2019 14:26   No results for input(s): WBC, HGB, HCT, PLT in the last 72 hours. No results for input(s): NA, K, CL, CO2, GLUCOSE, BUN, CREATININE, CALCIUM in the last 72 hours.  Physical Exam: BP 132/78 (BP Location: Right Arm)   Pulse 78   Temp 99.6 F (37.6 C)   Resp 20   Ht 5\' 7"  (1.702 m)   Wt 73.8 kg   SpO2 96%   BMI 25.48 kg/m   Constitutional: No distress . Vital signs reviewed. HEENT: EOMI, oral membranes moist Neck: supple Cardiovascular: RRR without murmur. No JVD    Respiratory: CTA Bilaterally without wheezes or rales. Normal effort     GI: BS +, non-tender, non-distended  Skin: Warm and dry.  Intact. Psych: Normal mood.  Normal behavior. Musc: No edema in extremities.  No tenderness in extremities. Neuro: Alert Motor:  LUE: shoulder abduction 1+/5, 0/5 distally, stable LLE: Hip flexion, knee extension 1+/5, ankle dorsiflexion 0/5,? Essentially the same No tone increase  Assessment/Plan: 1. Functional deficits secondary to right paramedian pontine infarct which require 3+ hours per day of interdisciplinary therapy in a comprehensive inpatient rehab setting.  Physiatrist is providing close team supervision and 24 hour management of active medical problems listed below.  Physiatrist and rehab team continue to assess barriers to discharge/monitor patient progress toward functional and medical goals  Care Tool:  Bathing    Body parts bathed by patient: Left arm, Chest, Abdomen, Front perineal area, Right upper leg, Left upper leg, Right lower leg, Left lower leg, Face   Body parts bathed by helper: Right arm, Left arm, Chest, Abdomen, Front perineal area, Buttocks     Bathing assist Assist Level: Maximal Assistance - Patient 24 - 49%     Upper Body Dressing/Undressing Upper body dressing   What is the patient wearing?: Pull over shirt    Upper body assist Assist Level: Maximal Assistance - Patient 25 - 49%    Lower Body Dressing/Undressing Lower body dressing      What is the patient wearing?: Pants     Lower body assist Assist for lower body dressing: Maximal Assistance - Patient 25 - 49%     Toileting Toileting Toileting Activity did not occur (Clothing management and hygiene only): N/A (no void or bm)  Toileting  assist Assist for toileting: Maximal Assistance - Patient 25 - 49% Assistive Device Comment: urinal   Transfers Chair/bed transfer  Transfers assist  Chair/bed transfer activity did not occur: Safety/medical concerns  Chair/bed transfer assist level: Moderate Assistance - Patient  50 - 74%     Locomotion Ambulation   Ambulation assist      Assist level: Moderate Assistance - Patient 50 - 74% Assistive device: Walker-rolling Max distance: 16ft   Walk 10 feet activity   Assist  Walk 10 feet activity did not occur: Safety/medical concerns(decreased strength/activity tolerance)  Assist level: Moderate Assistance - Patient - 50 - 74% Assistive device: Walker-rolling, Orthosis   Walk 50 feet activity   Assist Walk 50 feet with 2 turns activity did not occur: Safety/medical concerns(decreased strength/activity tolerance)  Assist level: Moderate Assistance - Patient - 50 - 74% Assistive device: Walker-rolling, Orthosis    Walk 150 feet activity   Assist Walk 150 feet activity did not occur: Safety/medical concerns(decreased strength/activity tolerance)         Walk 10 feet on uneven surface  activity   Assist Walk 10 feet on uneven surfaces activity did not occur: Safety/medical concerns(decreased strength/activity tolerance)         Wheelchair     Assist Will patient use wheelchair at discharge?: Yes Type of Wheelchair: Manual Wheelchair activity did not occur: Safety/medical concerns(Unable without skilled intervention, L hemi)  Wheelchair assist level: Supervision/Verbal cueing Max wheelchair distance: 190ft    Wheelchair 50 feet with 2 turns activity    Assist    Wheelchair 50 feet with 2 turns activity did not occur: (Unable without skilled intervention, L hemi)   Assist Level: Supervision/Verbal cueing   Wheelchair 150 feet activity     Assist Wheelchair 150 feet activity did not occur: (Unable without skilled intervention, L hemi)   Assist Level: Supervision/Verbal cueing      Medical Problem List and Plan: 1.  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   Antithrombotics: -DVT/anticoagulation: Lovenox             -antiplatelet therapy: Plavix 75 mg daily patient placed in [employer] factor XI inhibitor stroke prevention trial 3. Pain Management: Tylenol as needed  Voltaren gel changed to muscle rub right shoulder after discussion with neurology 4. Mood: Provide emotional support             -antipsychotic agents: N/A 5. Neuropsych: This patient is capable of making decisions on his own behalf. 6. Skin/Wound Care: Routine skin checks 7. Fluids/Electrolytes/Nutrition: Routine in and outs 8.  Chronic hyponatremia.  Low sodium in September through November 2020.    IVFs 100cc/hr d/ced  Na 132 on 1/18, labs ordered for Monday 9.  CLL/small B-cell lymphoma.  Follow-up Dr.Kale.  Patient last received chemotherapy October 2020. 10.  Type 2 diabetes mellitus with hyperglycemia.  Hemoglobin A1c 5.9.  Glucophage currently held.  Continue sliding scale  Relatively controlled on 1/23  Monitor with increased mobility 11.  Hyperlipidemia.  Lipitor 12.  History of hypertension.    On no medications at present  Relatively controlled on 1/23  Monitor with increased mobility 13. Mild Spasticity:  Cont to monitor  Controlled at present without medication-continue range of motion 14. OSA: refuses to wear CPAP 15. Hypoalbuminemia  Supplement initiated on 1/13 16. Leukopenia  WBCs 3.6 on 1/18, labs ordered for Monday  See #9  Cont to monitor  17. Acute on chronic anemia  Hb 9.6 on 1/18, labs ordered for Monday 18. Fever:   Afebrile x 72 hours, remains afebrile after Tylenol x24 hours  UA negative, urine culture negative  Blood cultures no growth FINAL  Chest x-ray on 1/19 reviewed, showing stable opacity, prolonged discussion with Heme/Onc regarding lymphoma and opacity-suggesting central origin.  Will continue to monitor for time being.  If patient has persistent fevers will consider ID consult/work-up for atypical pneumonia.  1/23: still with low  grade temp. Continue to monitor for now 19.  Sleep disturbance  Ambien 10  Improving  LOS: 11 days A FACE TO FACE EVALUATION WAS PERFORMED  Meredith Staggers 09/14/2019, 11:54 AM

## 2019-09-15 ENCOUNTER — Inpatient Hospital Stay (HOSPITAL_COMMUNITY): Payer: Medicare Other

## 2019-09-15 LAB — GLUCOSE, CAPILLARY: Glucose-Capillary: 121 mg/dL — ABNORMAL HIGH (ref 70–99)

## 2019-09-15 NOTE — Progress Notes (Signed)
Occupational Therapy Session Note  Patient Details  Name: [name] [name] MRN: [id number] Date of Birth: 1956/01/18  Today's Date: 09/15/2019 OT Individual Time: 1105-1205 OT Individual Time Calculation (min): 60 min    Short Term Goals: Week 2:  OT Short Term Goal 1 (Week 2): Pt will recall hemi dressing techniques wiht no VC to don shirt OT Short Term Goal 2 (Week 2): Pt will manage LUE prior to transfers to demo improved L attention OT Short Term Goal 3 (Week 2): Pt will complete toilet transfer with Min assist, ambulating with RW OT Short Term Goal 4 (Week 2): Pt will complete LB dressing with min assist (minus Lt shoe/AFO)  Skilled Therapeutic Interventions/Progress Updates:    Pt received supine, initially very lethargic with eyes closed but answering questions. With tactile cueing and facilitation pt able to initiate bed mobility to EOB, requiring max A overall. EOB pt with posterior lean requiring max A to maintain sitting balance statically. Pt able to complete sit <> stand from EOB with mod A. Pt reported urgent need to urinate upon stand but when he sat to use urinal had no void. Pt completed stand pivot transfer to the w/c with mod A, toward the L. No active movement in the LUE this session with cueing. Pt required cueing for hemi dressing and bathing techniques at the sink. Mod A overall for UB bathing and dressing. Pt became more alert as session progressed and was fully oriented. Pt still with difficulty sequencing/following commands at times. Pt stood for peri hygiene and had incontinent urination on the floor. He was able to return to sitting with mod A and use urinal with min A for positioning, voiding another 100 cc. Pt required max A overall for LB dressing. Max A for standing balance with pushing/lean toward the L when removing UE support from the sink to wash buttocks. Pt completed oral care with set up assist. Pt was left sitting up in the w/c with chair alarm belt fastened and  half lap tray to support LUE. All needs within reach.   Therapy Documentation Precautions:  Precautions Precautions: Fall Precaution Comments: L hemiparesis Restrictions Weight Bearing Restrictions: No   Therapy/Group: Individual Therapy  [name] [name] 09/15/2019, 12:22 PM

## 2019-09-15 NOTE — Progress Notes (Signed)
Physical Therapy Session Note  Patient Details  Name: [name] [name] MRN: [id number] Date of Birth: [birth year]  Today's Date: 09/15/2019 PT Individual Time: 0900-0915 PT Individual Time Calculation (min): 15 min  and Today's Date: 09/15/2019 PT Missed Time: 27 Minutes Missed Time Reason: Patient ill (Comment);Patient fatigue(low grade fever)  Short Term Goals: Week 2:  PT Short Term Goal 1 (Week 2): Pt will perform bed<>chair transfer with LRAD CGA PT Short Term Goal 2 (Week 2): Pt will ambulate 32ft with LRAD CGA PT Short Term Goal 3 (Week 2): Pt will perform car transfer with LRAD CGA  Skilled Therapeutic Interventions/Progress Updates:    Pt supine in bed upon PT arrival, pt asleep and difficult to arouse. Therapist attempted to wake patient with verbal/tactile stimuli. Pt agreeable to try sitting EOB. Max assist for supine>sitting, upon sitting pt requiring mod-max assist for sitting balance to limit posterior lean/LOB with cues to correct. Pt still only briefly opening eyes. Attempted to perform sit>stand within stedy for OOB transfer, pt unable to stand despite total assist from therapist and continues to lean backwards/fall back into the bed. Pt returned to supine and left with needs in reach and bed alarm set. Pt reports feeling tired and sick, previously running low grade fever. Missed 45 minutes of skilled therapy tx.   Therapy Documentation Precautions:  Precautions Precautions: Fall Precaution Comments: L hemiparesis Restrictions Weight Bearing Restrictions: No General: PT Amount of Missed Time (min): 45 Minutes PT Missed Treatment Reason: Patient ill (Comment);Patient fatigue(low grade fever)    Therapy/Group: Individual Therapy  Netta Corrigan, PT, DPT, CSRS 09/15/2019, 10:57 AM

## 2019-09-15 NOTE — Progress Notes (Signed)
[facility] PHYSICAL MEDICINE & REHABILITATION PROGRESS NOTE  Subjective/Complaints: Pt slept well. Had no complaints or questions this morning  ROS: Patient denies fever, rash, sore throat, blurred vision, nausea, vomiting, diarrhea, cough, shortness of breath or chest pain, joint or back pain, headache, or mood change.    Objective: Vital Signs: Blood pressure 138/77, pulse 79, temperature 97.9 F (36.6 C), temperature source Oral, resp. rate 16, height 5\' 7"  (1.702 m), weight 73.8 kg, SpO2 95 %. CT HEAD WO CONTRAST  Result Date: 09/13/2019 CLINICAL DATA:  Stroke, follow-up, type II diabetes mellitus, hypertension EXAM: CT HEAD WITHOUT CONTRAST TECHNIQUE: Contiguous axial images were obtained from the base of the skull through the vertex without intravenous contrast. Sagittal and coronal MPR images reconstructed from axial data set. COMPARISON:  08/29/2019, MR brain 08/30/2019 FINDINGS: Brain: Normal ventricular morphology. No midline shift or mass effect. Normal appearance of brain parenchyma. No intracranial hemorrhage, mass lesion, evidence of acute infarction, or extra-axial fluid collection. RIGHT pontine infarct identified by prior MR not identified on current exam. Vascular: No hyperdense vessels Skull: Intact Sinuses/Orbits: Clear Other: N/A IMPRESSION: No acute intracranial abnormalities. Electronically Signed   By: Lavonia Dana M.D.   On: 09/13/2019 14:26   No results for input(s): WBC, HGB, HCT, PLT in the last 72 hours. No results for input(s): NA, K, CL, CO2, GLUCOSE, BUN, CREATININE, CALCIUM in the last 72 hours.  Physical Exam: BP 138/77 (BP Location: Right Arm)   Pulse 79   Temp 97.9 F (36.6 C) (Oral)   Resp 16   Ht 5\' 7"  (1.702 m)   Wt 73.8 kg   SpO2 95%   BMI 25.48 kg/m   Constitutional: No distress . Vital signs reviewed. HEENT: EOMI, oral membranes moist Neck: supple Cardiovascular: RRR without murmur. No JVD    Respiratory: CTA Bilaterally without wheezes or  rales. Normal effort    GI: BS +, non-tender, non-distended  Skin: Warm and dry.  Intact. Psych: Normal mood.  Normal behavior. Musc: No edema in extremities.  No tenderness in extremities. Neuro: Alert Motor:  LUE: shoulder abduction 1+/5, 0/5 distally, stable LLE: Hip flexion, knee extension 1+/5, ankle dorsiflexion 0/5,? Essentially the same No tone increase  Assessment/Plan: 1. Functional deficits secondary to right paramedian pontine infarct which require 3+ hours per day of interdisciplinary therapy in a comprehensive inpatient rehab setting.  Physiatrist is providing close team supervision and 24 hour management of active medical problems listed below.  Physiatrist and rehab team continue to assess barriers to discharge/monitor patient progress toward functional and medical goals  Care Tool:  Bathing    Body parts bathed by patient: Left arm, Chest, Abdomen, Front perineal area, Right upper leg, Left upper leg, Right lower leg, Left lower leg, Face   Body parts bathed by helper: Right arm, Left arm, Chest, Abdomen, Front perineal area, Buttocks     Bathing assist Assist Level: Maximal Assistance - Patient 24 - 49%     Upper Body Dressing/Undressing Upper body dressing   What is the patient wearing?: Pull over shirt    Upper body assist Assist Level: Maximal Assistance - Patient 25 - 49%    Lower Body Dressing/Undressing Lower body dressing      What is the patient wearing?: Pants     Lower body assist Assist for lower body dressing: Maximal Assistance - Patient 25 - 49%     Toileting Toileting Toileting Activity did not occur (Clothing management and hygiene only): N/A (no void or bm)  Toileting assist Assist for toileting: Maximal Assistance - Patient 25 - 49% Assistive Device Comment: urinal   Transfers Chair/bed transfer  Transfers assist  Chair/bed transfer activity did not occur: Safety/medical concerns  Chair/bed transfer assist level: Moderate  Assistance - Patient 50 - 74%     Locomotion Ambulation   Ambulation assist      Assist level: Moderate Assistance - Patient 50 - 74% Assistive device: Walker-rolling Max distance: 49ft   Walk 10 feet activity   Assist  Walk 10 feet activity did not occur: Safety/medical concerns(decreased strength/activity tolerance)  Assist level: Moderate Assistance - Patient - 50 - 74% Assistive device: Walker-rolling, Orthosis   Walk 50 feet activity   Assist Walk 50 feet with 2 turns activity did not occur: Safety/medical concerns(decreased strength/activity tolerance)  Assist level: Moderate Assistance - Patient - 50 - 74% Assistive device: Walker-rolling, Orthosis    Walk 150 feet activity   Assist Walk 150 feet activity did not occur: Safety/medical concerns(decreased strength/activity tolerance)         Walk 10 feet on uneven surface  activity   Assist Walk 10 feet on uneven surfaces activity did not occur: Safety/medical concerns(decreased strength/activity tolerance)         Wheelchair     Assist Will patient use wheelchair at discharge?: Yes Type of Wheelchair: Manual Wheelchair activity did not occur: Safety/medical concerns(Unable without skilled intervention, L hemi)  Wheelchair assist level: Supervision/Verbal cueing Max wheelchair distance: 172ft    Wheelchair 50 feet with 2 turns activity    Assist    Wheelchair 50 feet with 2 turns activity did not occur: (Unable without skilled intervention, L hemi)   Assist Level: Supervision/Verbal cueing   Wheelchair 150 feet activity     Assist Wheelchair 150 feet activity did not occur: (Unable without skilled intervention, L hemi)   Assist Level: Supervision/Verbal cueing      Medical Problem List and Plan: 1.  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   Antithrombotics: -DVT/anticoagulation: Lovenox             -antiplatelet therapy: Plavix 75 mg daily patient placed in [employer] factor XI inhibitor stroke prevention trial 3. Pain Management: Tylenol as needed  Voltaren gel changed to muscle rub right shoulder after discussion with neurology 4. Mood: Provide emotional support             -antipsychotic agents: N/A 5. Neuropsych: This patient is capable of making decisions on his own behalf. 6. Skin/Wound Care: Routine skin checks 7. Fluids/Electrolytes/Nutrition: Routine in and outs 8.  Chronic hyponatremia.  Low sodium in September through November 2020.    IVFs 100cc/hr d/ced  Na 132 on 1/18, labs ordered for Monday 9.  CLL/small B-cell lymphoma.  Follow-up Dr.Kale.  Patient last received chemotherapy October 2020. 10.  Type 2 diabetes mellitus with hyperglycemia.  Hemoglobin A1c 5.9.  Glucophage currently held.  Continue sliding scale  Well controlled on 1/24  Monitor with increased mobility 11.  Hyperlipidemia.  Lipitor 12.  History of hypertension.    On no medications at present  Relatively controlled on 1/24  Monitor with increased mobility 13. Mild Spasticity:  Cont to monitor  Controlled at present without medication-continue range of motion 14. OSA: refuses to wear CPAP 15. Hypoalbuminemia  Supplement initiated on 1/13 16. Leukopenia  WBCs 3.6 on 1/18, labs ordered for Monday  See #9  Cont to  monitor 17. Acute on chronic anemia  Hb 9.6 on 1/18, labs ordered for Monday 18. Fever:   Afebrile x 72 hours, remains afebrile after Tylenol x24 hours  UA negative, urine culture negative  Blood cultures no growth FINAL  Chest x-ray on 1/19 reviewed, showing stable opacity, prolonged discussion with Heme/Onc regarding lymphoma and opacity-suggesting central origin.  Will continue to monitor for time being.  If patient has persistent fevers will consider ID consult/work-up for atypical pneumonia.  1/24 low grade temp  yesterday (Tm 100.5 at 15:21). Consider ID consult tomorrow  19.  Sleep disturbance  Ambien 10  Improving  LOS: 12 days A FACE TO FACE EVALUATION WAS PERFORMED  Meredith Staggers 09/15/2019, 10:37 AM

## 2019-09-16 ENCOUNTER — Inpatient Hospital Stay (HOSPITAL_COMMUNITY): Payer: Medicare Other | Admitting: Occupational Therapy

## 2019-09-16 ENCOUNTER — Inpatient Hospital Stay (HOSPITAL_COMMUNITY): Payer: Medicare Other

## 2019-09-16 ENCOUNTER — Inpatient Hospital Stay (HOSPITAL_COMMUNITY): Payer: Medicare Other | Admitting: Physical Therapy

## 2019-09-16 DIAGNOSIS — I69322 Dysarthria following cerebral infarction: Secondary | ICD-10-CM

## 2019-09-16 DIAGNOSIS — Z8572 Personal history of non-Hodgkin lymphomas: Secondary | ICD-10-CM

## 2019-09-16 DIAGNOSIS — Z87891 Personal history of nicotine dependence: Secondary | ICD-10-CM

## 2019-09-16 DIAGNOSIS — E119 Type 2 diabetes mellitus without complications: Secondary | ICD-10-CM

## 2019-09-16 DIAGNOSIS — Z887 Allergy status to serum and vaccine status: Secondary | ICD-10-CM

## 2019-09-16 DIAGNOSIS — I69354 Hemiplegia and hemiparesis following cerebral infarction affecting left non-dominant side: Principal | ICD-10-CM

## 2019-09-16 DIAGNOSIS — Z9221 Personal history of antineoplastic chemotherapy: Secondary | ICD-10-CM

## 2019-09-16 DIAGNOSIS — Z888 Allergy status to other drugs, medicaments and biological substances status: Secondary | ICD-10-CM

## 2019-09-16 DIAGNOSIS — Z856 Personal history of leukemia: Secondary | ICD-10-CM

## 2019-09-16 LAB — CBC WITH DIFFERENTIAL/PLATELET
Abs Immature Granulocytes: 0.19 10*3/uL — ABNORMAL HIGH (ref 0.00–0.07)
Basophils Absolute: 0.1 10*3/uL (ref 0.0–0.1)
Basophils Relative: 2 %
Eosinophils Absolute: 0.2 10*3/uL (ref 0.0–0.5)
Eosinophils Relative: 5 %
HCT: 31.5 % — ABNORMAL LOW (ref 39.0–52.0)
Hemoglobin: 10.6 g/dL — ABNORMAL LOW (ref 13.0–17.0)
Immature Granulocytes: 7 %
Lymphocytes Relative: 8 %
Lymphs Abs: 0.2 10*3/uL — ABNORMAL LOW (ref 0.7–4.0)
MCH: 24.3 pg — ABNORMAL LOW (ref 26.0–34.0)
MCHC: 33.7 g/dL (ref 30.0–36.0)
MCV: 72.1 fL — ABNORMAL LOW (ref 80.0–100.0)
Monocytes Absolute: 0.6 10*3/uL (ref 0.1–1.0)
Monocytes Relative: 21 %
Neutro Abs: 1.7 10*3/uL (ref 1.7–7.7)
Neutrophils Relative %: 57 %
Platelets: 314 10*3/uL (ref 150–400)
RBC: 4.37 MIL/uL (ref 4.22–5.81)
RDW: 16.7 % — ABNORMAL HIGH (ref 11.5–15.5)
WBC: 2.9 10*3/uL — ABNORMAL LOW (ref 4.0–10.5)
nRBC: 0 % (ref 0.0–0.2)

## 2019-09-16 LAB — BASIC METABOLIC PANEL
Anion gap: 11 (ref 5–15)
BUN: 13 mg/dL (ref 8–23)
CO2: 26 mmol/L (ref 22–32)
Calcium: 10 mg/dL (ref 8.9–10.3)
Chloride: 95 mmol/L — ABNORMAL LOW (ref 98–111)
Creatinine, Ser: 1.09 mg/dL (ref 0.61–1.24)
GFR calc Af Amer: >60 mL/min (ref >60)
GFR calc non Af Amer: >60 mL/min (ref >60)
Glucose, Bld: 178 mg/dL — ABNORMAL HIGH (ref 70–99)
Potassium: 4 mmol/L (ref 3.5–5.1)
Sodium: 132 mmol/L — ABNORMAL LOW (ref 135–145)

## 2019-09-16 LAB — GLUCOSE, CAPILLARY: Glucose-Capillary: 107 mg/dL — ABNORMAL HIGH (ref 70–99)

## 2019-09-16 NOTE — Progress Notes (Addendum)
Speech Language Pathology Weekly Progress and Session Note  Patient Details  Name: Marquist R Eccleston MRN: [id number] Date of Birth: 04/15/1956  Beginning of progress report period: September 10, 2019 End of progress report period: September 16, 2019  Today's Date: 09/16/2019 SLP Individual Time: 1300-1344 SLP Individual Time Calculation (min): 44 min  Short Term Goals: Week 2: SLP Short Term Goal 1 (Week 2): Patient will demonstrate complex problem solving for functional and familiar tasks with supervision level verbal cues. SLP Short Term Goal 1 - Progress (Week 2): Not met SLP Short Term Goal 2 (Week 2): Patient will recall new, daily information with Mod I for use of memory compensatory strategies. SLP Short Term Goal 2 - Progress (Week 2): Not met    New Short Term Goals: Week 3: SLP Short Term Goal 1 (Week 3): STG=LTG due to short ELOS  Weekly Progress Updates:Pt made inconsistent progress this reporting period impacted by reduced arousal and lethargy. In the beginning of the reporting period demonstrated progress with complex problem solving, recall within complex task and use of executive function strategies, however by the end of the reporting period pt demonstrated a drastic change in ability. CT was conducted on 1/22 with no indication of changes and medical team is conducting further investigation of persistent increase temperature. SLP increased therapy frequency, and downgraded LTG for problem solving, memory and added attention goal. Pt would benefit from skilled ST services in order to maximize functional independence and reduce burden of care, requiring 24/hour supervision and continue ST services.     Intensity: Minumum of 1-2 x/day, 30 to 90 minutes Frequency: 3 to 5 out of 7 days Duration/Length of Stay: 1/30 Treatment/Interventions: Cognitive remediation/compensation;Internal/external aids;Cueing hierarchy;Environmental controls;Therapeutic Activities;Patient/family  education;Functional tasks   Daily Session  Skilled Therapeutic Interventions:  Skilled ST services focused on cognitive skills. Pt was severely impacted by reduced arousal, in 30 seconds to 1 minute interval and overall lethargy during this treatment session. Pt complained of "I feel like a block of ice." SLP provided blanket and increase room tempeture. Pt was attempting to consuming lunch tray, however eyes remained closed most of the session. Pt was able to consume 10% of tray with max A verbal cues for sustained attention in the above listed intervals. SLP attempted to address basic problem solving and attention in simple coin sorting task, however pt was unable to remain alert. Pt demonstrated recall of x1 event from today's therapy sessions and demonstrated little awareness of reduced alertness when questions. SLP provided education of need to downgrade goals due to current display of deficits and continued medical investigation into causes for changes in alertness/cognition. Pt was left in room with call bell within reach and chair alarm set. ST recommends to continue skilled ST services.     General    Pain Pain Assessment Pain Score: 0-No pain  Therapy/Group: Individual Therapy  MADISON  CRATCH 09/16/2019, 3:23 PM         

## 2019-09-16 NOTE — Progress Notes (Addendum)
[facility] PHYSICAL MEDICINE & REHABILITATION PROGRESS NOTE  Subjective/Complaints: Pt slept well. Had no complaints or questions this morning  ROS: Patient denies fever, rash, sore throat, blurred vision, nausea, vomiting, diarrhea, cough, shortness of breath or chest pain, joint or back pain, headache, or mood change.    Objective: Vital Signs: Blood pressure 134/79, pulse 72, temperature 100 F (37.8 C), temperature source Oral, resp. rate 16, height 5\' 7"  (1.702 m), weight 73.8 kg, SpO2 96 %. No results found. Recent Labs    09/16/19 0840  WBC 2.9*  HGB 10.6*  HCT 31.5*  PLT 314   Recent Labs    09/16/19 0840  NA 132*  K 4.0  CL 95*  CO2 26  GLUCOSE 178*  BUN 13  CREATININE 1.09  CALCIUM 10.0    Physical Exam: BP 134/79 (BP Location: Left Arm)   Pulse 72   Temp 100 F (37.8 C) (Oral)   Resp 16   Ht 5\' 7"  (1.702 m)   Wt 73.8 kg   SpO2 96%   BMI 25.48 kg/m   Constitutional: No distress . Vital signs reviewed. HEENT: EOMI, oral membranes moist Neck: supple Cardiovascular: RRR without murmur. No JVD    Respiratory: CTA Bilaterally without wheezes or rales. Normal effort    GI: BS +, non-tender, non-distended  Skin: Warm and dry.  Intact. Psych: Normal mood.  Normal behavior. Musc: No edema in extremities.  No tenderness in extremities. Neuro: Alert Motor:  LUE: shoulder abduction 1+/5, 0/5 distally, stable LLE: Hip flexion, knee extension 1+/5, ankle dorsiflexion 0/5,? Essentially the same No tone increase  Assessment/Plan: 1. Functional deficits secondary to right paramedian pontine infarct which require 3+ hours per day of interdisciplinary therapy in a comprehensive inpatient rehab setting.  Physiatrist is providing close team supervision and 24 hour management of active medical problems listed below.  Physiatrist and rehab team continue to assess barriers to discharge/monitor patient progress toward functional and medical goals  Care  Tool:  Bathing    Body parts bathed by patient: Left arm, Chest, Abdomen, Front perineal area, Right upper leg, Left upper leg, Right lower leg, Left lower leg, Face   Body parts bathed by helper: Right arm, Left arm, Chest, Abdomen, Front perineal area, Buttocks     Bathing assist Assist Level: Maximal Assistance - Patient 24 - 49%     Upper Body Dressing/Undressing Upper body dressing   What is the patient wearing?: Pull over shirt    Upper body assist Assist Level: Maximal Assistance - Patient 25 - 49%    Lower Body Dressing/Undressing Lower body dressing      What is the patient wearing?: Pants     Lower body assist Assist for lower body dressing: Maximal Assistance - Patient 25 - 49%     Toileting Toileting Toileting Activity did not occur (Clothing management and hygiene only): N/A (no void or bm)  Toileting assist Assist for toileting: Maximal Assistance - Patient 25 - 49% Assistive Device Comment: urinal   Transfers Chair/bed transfer  Transfers assist  Chair/bed transfer activity did not occur: Safety/medical concerns  Chair/bed transfer assist level: Moderate Assistance - Patient 50 - 74%     Locomotion Ambulation   Ambulation assist      Assist level: Moderate Assistance - Patient 50 - 74% Assistive device: Walker-rolling Max distance: 5ft   Walk 10 feet activity   Assist  Walk 10 feet activity did not occur: Safety/medical concerns(decreased strength/activity tolerance)  Assist level: Moderate Assistance -  Patient - 50 - 74% Assistive device: Walker-rolling, Orthosis   Walk 50 feet activity   Assist Walk 50 feet with 2 turns activity did not occur: Safety/medical concerns(decreased strength/activity tolerance)  Assist level: Moderate Assistance - Patient - 50 - 74% Assistive device: Walker-rolling, Orthosis    Walk 150 feet activity   Assist Walk 150 feet activity did not occur: Safety/medical concerns(decreased  strength/activity tolerance)         Walk 10 feet on uneven surface  activity   Assist Walk 10 feet on uneven surfaces activity did not occur: Safety/medical concerns(decreased strength/activity tolerance)         Wheelchair     Assist Will patient use wheelchair at discharge?: Yes Type of Wheelchair: Manual Wheelchair activity did not occur: Safety/medical concerns(Unable without skilled intervention, L hemi)  Wheelchair assist level: Supervision/Verbal cueing Max wheelchair distance: 134ft    Wheelchair 50 feet with 2 turns activity    Assist    Wheelchair 50 feet with 2 turns activity did not occur: (Unable without skilled intervention, L hemi)   Assist Level: Supervision/Verbal cueing   Wheelchair 150 feet activity     Assist Wheelchair 150 feet activity did not occur: (Unable without skilled intervention, L hemi)   Assist Level: Supervision/Verbal cueing      Medical Problem List and Plan: 1.  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  Antithrombotics: -DVT/anticoagulation: Lovenox             -antiplatelet therapy: Plavix 75 mg daily patient placed in [employer] factor XI inhibitor stroke prevention trial 3. Pain Management: Tylenol as needed  Voltaren gel changed to muscle rub right shoulder after discussion with neurology 4. Mood: Provide emotional support             -antipsychotic agents: N/A 5. Neuropsych: This patient is capable of making decisions on his own behalf. 6. Skin/Wound Care: Routine skin checks 7. Fluids/Electrolytes/Nutrition: Routine in and outs 8.  Chronic hyponatremia.  Low sodium in September through November 2020.    IVFs 100cc/hr d/ced  Na 132 on 1/18 and 1/25 9.  CLL/small B-cell lymphoma.  Follow-up Dr.Kale.  Patient last received chemotherapy October 2020. 10.  Type 2 diabetes mellitus with  hyperglycemia.  Hemoglobin A1c 5.9.  Glucophage currently held.  Continue sliding scale  Well controlled on 1/24, 1/25  Monitor with increased mobility 11.  Hyperlipidemia.  Lipitor 12.  History of hypertension.    On no medications at present  Relatively controlled on 1/24, 1/25  Monitor with increased mobility 13. Mild Spasticity:  Cont to monitor  Controlled at present without medication-continue range of motion 14. OSA: refuses to wear CPAP 15. Hypoalbuminemia  Supplement initiated on 1/13 16. Leukopenia  WBCs 3.6 on 1/18, 2.9 on 1/25  See #9  Cont to monitor 17. Acute on chronic anemia  Hb 9.6 on 1/18, 10.6 on 1/25 18. Fever:   Afebrile x 72 hours, remains afebrile after Tylenol x24 hours  UA negative, urine culture negative  Blood cultures no growth FINAL  Chest x-ray on 1/19 reviewed, showing stable opacity, prolonged discussion with Heme/Onc regarding lymphoma and opacity-suggesting central origin.  Will continue to monitor for time being.  If patient has persistent fevers will consider ID consult/work-up for atypical pneumonia.  1/25 Temp continues to be elevated; will consult ID.  19.  Sleep disturbance  Ambien 10  Improving  LOS: 13  days A FACE TO FACE EVALUATION WAS PERFORMED  Izora Ribas 09/16/2019, 9:53 AM

## 2019-09-16 NOTE — Progress Notes (Signed)
Occupational Therapy Session Note  Patient Details  Name: [name] [name] MRN: [id number] Date of Birth: Dec 31, 1955  Today's Date: 09/16/2019 OT Individual Time: [id number] OT Individual Time Calculation (min): 44 min  and Today's Date: 09/16/2019 OT Missed Time: 31 Minutes Missed Time Reason: Patient fatigue;Other (comment)(breakfast)   Short Term Goals: Week 2:  OT Short Term Goal 1 (Week 2): Pt will recall hemi dressing techniques wiht no VC to don shirt OT Short Term Goal 2 (Week 2): Pt will manage LUE prior to transfers to demo improved L attention OT Short Term Goal 3 (Week 2): Pt will complete toilet transfer with Min assist, ambulating with RW OT Short Term Goal 4 (Week 2): Pt will complete LB dressing with min assist (minus Lt shoe/AFO)  Skilled Therapeutic Interventions/Progress Updates:    Treatment session with focus on arousal and participation in treatment session.  Pt received in bed asleep, requiring increased time to arouse.  RN in room attempting to ask pt orientation questions.  Pt required more than reasonable time and max cues to state name and DOB.  Completed bed mobility with max assist to come to sitting EOB.  Pt with posterior and Lt lean in sitting at EOB requiring mod-max assist to maintain sitting balance.  Pt with minimal activation to lift LLE against gravity to allow for LB dressing.  Required total assist for LB dressing and +2 to pull pants over hips while pt in standing with max assist for standing balance.  Pt frequently closing eyes throughout session.  Returned to upright in chair position in bed to engage in self-feeding.  Therapist provided assist for setup and initial supervision to maintain arousal.  Pt requiring increased time for initiation throughout all tasks this session.  Pt demonstrating difficulty following commands this session.   Remained upright in chair position to increase arousal, however pt still unable to maintain arousal - therefore missed  remaining 31 mins of session.    RN aware of therapist concerns in pt change in status.  RN notified PA.  Therapy Documentation Precautions:  Precautions Precautions: Fall Precaution Comments: L hemiparesis Restrictions Weight Bearing Restrictions: No General: General OT Amount of Missed Time: 31 Minutes Pain:  Pt with c/o pain in LUE.  Repositioned.   Therapy/Group: Individual Therapy  Simonne Come 09/16/2019, 12:01 PM

## 2019-09-16 NOTE — Progress Notes (Signed)
Physical Therapy Session Note  Patient Details  Name: [name] [name] MRN: [id number] Date of Birth: 05-22-1956  Today's Date: 09/16/2019 PT Individual Time: 1005-1100 PT Individual Time Calculation (min): 55 min   Short Term Goals: Week 2:  PT Short Term Goal 1 (Week 2): Pt will perform bed<>chair transfer with LRAD CGA PT Short Term Goal 2 (Week 2): Pt will ambulate 27ft with LRAD CGA PT Short Term Goal 3 (Week 2): Pt will perform car transfer with LRAD CGA  Skilled Therapeutic Interventions/Progress Updates:  Pt received in bed, verbalizing hello to therapist.  Pt transfers supine>sitting EOB with mod assist with use of hospital bed features. Once sitting EOB pt requires up to max assist for static sitting balance as pt pushing L with RUE and when RUE placed in lap pt falling to L with pt unable to correct despite multimodal cuing & facilitation. Therapist had w/c set up for squat pivot transfer and educated pt on head/hips relationship but pt unable to safely demonstrate this even with manual facilitation so pt returned to bed with mod assist. Therapist obtained steady & pt returned to sitting EOB & pt transferred sit<>stand with use of stedy +2 with pt able to pull himself to standing but pt with strong push to L with RUE. Pt washed face with cloth with supervision. Transported pt to/from gym via w/c dependent assist. Pt transferred to standing in stedy with min assist for sit<>stand and engaged in dynavision 2 minutes x 2 with mod assist for sitting balance 2/2 LOB to L and pt performing sit<>stand with min assist with ongoing L LOB and min cuing to locate lights on L side of board. Pt returned to room & assisted with using urinal from w/c level & pt with continent void. Pt left in w/c with chair alarm donned & call bell in lap, set up with tray & consuming snack (per safety plan, ok to leave tray in front of pt).  Pt able to verbalize when asked orientation questions & pt oriented x 4 but very  lethargic during first half of session with improvement in 2nd half.  Pt denies c/o pain but grimaced with LUE movement & therapist repositioned UE for increased comfort.  Therapist observed small/slight tremor to R side with 2nd initiation of supine>sitting EOB & while engaging in dynavision & RN made aware.  Therapy Documentation Precautions:  Precautions Precautions: Fall Precaution Comments: L hemiparesis Restrictions Weight Bearing Restrictions: No   Therapy/Group: Individual Therapy  Waunita Schooner 09/16/2019, 11:05 AM

## 2019-09-16 NOTE — Consult Note (Signed)
[facility]    Date of Admission:  09/03/2019      Total days of antibiotics 0                Reason for Consult: Fever of unknown origin    Referring Provider: Posey Pronto  Primary Care Provider: Bernerd Limbo, MD    Assessment: [name] [name] is a 64 y.o. male with history of CLL/B-cell lymphoma whom was undergoing chemotherapy with  Bendamustine/Rituxan through October 2020 under Dr. Grier Mitts care; it was decided at that time to take a break from chemo and resume at the beginning of the year 2021. 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 During his stay in rehab he has had intermittent low grade fevers of 100.3 - 100.7 F. Blood and urine cultures are negative. He has not had any antibiotics. He has also developed leucopenia without   At this time there are no definitive findings concerning for bacterial infection. He does have a new nodular opacity to the right middle lung field and a previously cleared nodular finding in the left upper comparing previous scans.  He has had 1 month history of night sweats and sounds like he has had chills consistently since November/December. He also describes weight loss but uncertain as to how much (in the hospital 3 weeks now).   Would hold off on antibiotics for now. Concerned that his fevers/leucopenia are due to lymphoproliferative process - oncology team is aware per notes from rehab team.   Will discuss with Dr. Linus Salmons for work up regarding any atypical pulmonary pathogens given his immunosuppression and nodular findings.    Plan: 1. No need for antibiotics at this time.  2. Follow for oncology recommendations.      Principal Problem:   Right pontine CVA (HCC) Active Problems:   Acute on chronic anemia   Lymphopenia   Hypoalbuminemia due to protein-calorie malnutrition (HCC)   Controlled type 2 diabetes mellitus with hyperglycemia, without  long-term current use of insulin (HCC)   Benign essential HTN   Labile blood glucose   Fever   Reactive depression   Opacity of lung on imaging study   History of hypertension   Sleep disturbance   . atorvastatin  20 mg Oral Daily  . clopidogrel  75 mg Oral Daily  . enoxaparin (LOVENOX) injection  40 mg Subcutaneous Q24H  . feeding supplement (PRO-STAT SUGAR FREE 64)  30 mL Oral BID  . insulin aspart  0-9 Units Subcutaneous QAC breakfast  . BAY [id number] or placebo ([place])  1 tablet Oral Daily   And  . BAY [id number] or placebo ([place])  1 tablet Oral Daily  . zolpidem  10 mg Oral QHS    HPI: [name] [name] is a 64 y.o. male with pmhx including controlled diabetes (A1C 5.9%), hyponatremia, hypertension, tobacco use, CLL/small B-cell lymphoma followed by Dr. Irene Limbo (last Bendamustine/Rituxan October 2020).   Recently on 08/29/19 he presented with left-sided weakness and mild dysarthria. Cranial CT scan was unremarkable; MRI revealed an acute right paramedian pontine infarction. He did not receive any TPA for this. CTA of the head/neck without any high vessel occlusions. TTE with EF 65%  Since his stroke he has had left sided weakness. Feels he has been doing well in rehab and should be done the 10th of February. He has no complaints in particular. Feels that he has had some weight  loss and does remark some sweating over the last 4 weeks. He falls asleep often during conversation. He denies any lumps/bumps in head/neck or axilla. Does not offer much with regards to history.   Pending infectious work up at this time includes negative urine cultures, negative blood cultures. Chest XRay with persistent opacification of the medial aspect of RLL, otherwise clear.  Prior to admission CT scan revealed improved lymphadenopathy in the chest/abdomen/pelvis. Prior left upper lobe/suprahilar nodular opacity has resolved with new patchy/nodular opacities in the right middle and lower lobes (favored to  be infectious/inflammation).   10-2017: Hep BcAb (-), sAg (-), Hep C Ab (-)   In looking back at onc visits he has primarily complained of nasal congestion unrelieved by saline, dry mouth. Also mention of "black holes" in his eyes. Mentions of Chills in October / November without measured fevers. At the last office visit in November the plan was to take a breat from treatment given improvement in lymphadenopathy and overall improvement in lymphoma symptoms. Plan was to start back "early next year."   Review of Systems: Review of Systems  Constitutional: Positive for chills, diaphoresis, fever and weight loss.  HENT: Negative for sinus pain.   Respiratory: Negative for cough, sputum production and shortness of breath.   Cardiovascular: Negative for chest pain.  Gastrointestinal: Negative for abdominal pain, diarrhea, nausea and vomiting.  Genitourinary: Negative for dysuria.  Musculoskeletal: Negative for back pain and myalgias.  Neurological: Positive for tremors, focal weakness and weakness.    Past Medical History:  Diagnosis Date  . Allergic rhinitis, cause unspecified   . Backache, unspecified   . Body mass index 33.0-33.9, adult   . Diabetes mellitus without complication ([place])    Type II  . Elevated blood pressure reading without diagnosis of hypertension   . Esophageal reflux   . Generalized pain   . Heartburn   . Insomnia, unspecified   . 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 Other abnormal blood chemistry   . Other and unspecified hyperlipidemia   . Other dyspnea and respiratory abnormality   . Other nonspecific findings on examination of blood(790.99)   . Pain in joint, lower leg   . Pneumonia    1968  . Sleep apnea    Does not use or own a CPAP  . Tobacco use disorder   . Unspecified essential hypertension     Social History   Tobacco Use  . Smoking status: Former Smoker    Packs/day: 0.20    Years: 20.00     Pack years: 4.00    Types: Cigars    Quit date: 2014    Years since quitting: 7.0  . Smokeless tobacco: Never Used  Substance Use Topics  . Alcohol use: Yes    Comment: 2 40oz beers a week  . Drug use: No    Family History  Problem Relation Age of Onset  . Diabetes Mother   . Hypertension Mother   . Cancer Paternal Uncle    Allergies  Allergen Reactions  . Lisinopril Itching  . Tuberculin Tests Swelling    OBJECTIVE: Blood pressure 134/79, pulse 72, temperature 100 F (37.8 C), temperature source Oral, resp. rate 16, height 5\' 7"  (1.702 m), weight 73.8 kg, SpO2 96 %.  Physical Exam Vitals reviewed.  Constitutional:      General: He is not in acute distress.    Comments: Sitting up in wheelchair. Falls asleep easily in conversation. Soft spoken.  HENT:     Mouth/Throat:     Mouth: Mucous membranes are moist.     Pharynx: Oropharynx is clear.  Eyes:     General: No scleral icterus. Cardiovascular:     Rate and Rhythm: Normal rate and regular rhythm.     Heart sounds: No murmur.  Pulmonary:     Effort: Pulmonary effort is normal.     Comments: Slightly coarse over anterior mid chest.  Abdominal:     General: Abdomen is flat. There is no distension.     Tenderness: There is no abdominal tenderness.  Musculoskeletal:     Comments: L hemiparesis   Lymphadenopathy:     Cervical: No cervical adenopathy.     Upper Body:     Right upper body: No axillary adenopathy.     Left upper body: No axillary adenopathy.  Skin:    General: Skin is warm and dry.     Capillary Refill: Capillary refill takes less than 2 seconds.  Neurological:     Comments: Answers questions appropriately.      Lab Results Lab Results  Component Value Date   WBC 2.9 (L) 09/16/2019   HGB 10.6 (L) 09/16/2019   HCT 31.5 (L) 09/16/2019   MCV 72.1 (L) 09/16/2019   PLT 314 09/16/2019    Lab Results  Component Value Date   CREATININE 1.09 09/16/2019   BUN 13 09/16/2019   NA 132 (L)  09/16/2019   K 4.0 09/16/2019   CL 95 (L) 09/16/2019   CO2 26 09/16/2019    Lab Results  Component Value Date   ALT 8 09/04/2019   AST 16 09/04/2019   ALKPHOS 49 09/04/2019   BILITOT 0.7 09/04/2019      Microbiology: Recent Results (from the past 240 hour(s))  Culture, blood (routine x 2)     Status: None   Collection Time: 09/09/19 11:35 AM   Specimen: BLOOD  Result Value Ref Range Status   Specimen Description BLOOD RIGHT ANTECUBITAL  Final   Special Requests   Final    BOTTLES DRAWN AEROBIC AND ANAEROBIC Blood Culture results may not be optimal due to an inadequate volume of blood received in culture bottles   Culture   Final    NO GROWTH 5 DAYS Performed at [facility], [place] [address] [place], [place] [address]    Report Status 09/14/2019 FINAL  Final  Culture, blood (routine x 2)     Status: None   Collection Time: 09/09/19 11:43 AM   Specimen: BLOOD LEFT HAND  Result Value Ref Range Status   Specimen Description BLOOD LEFT HAND  Final   Special Requests   Final    BOTTLES DRAWN AEROBIC ONLY Blood Culture results may not be optimal due to an inadequate volume of blood received in culture bottles   Culture   Final    NO GROWTH 5 DAYS Performed at [facility], [place] [address] [place], [place] [address]    Report Status 09/14/2019 FINAL  Final  Culture, Urine     Status: None   Collection Time: 09/09/19  3:23 PM   Specimen: Urine, Clean Catch  Result Value Ref Range Status   Specimen Description URINE, CLEAN CATCH  Final   Special Requests Immunocompromised  Final   Culture   Final    NO GROWTH Performed at [facility], [place] [address] [place], [place] [address]    Report Status 09/10/2019 FINAL  Final  Janene Madeira, MSN, NP-C [facility] Pager: [phone]  09/16/2019 1:58 PM

## 2019-09-16 NOTE — Progress Notes (Signed)
Pt seen this am in bed. Pt alert to voice and would open eyes and then close again. Asked pt several times for name and DOB without a response. Rubbed shoulder and pt opened eyes and was able to tell me his name and DOB. Pt required max assist and cuing to sit on side of bed and was noted to have heavy left lean that was not noted with prior assessments. Pt also noted with facial droop and difficulty moving left leg now where was able to move and perform min assist stand pivots with AFO.  Dan aware of changes, CT negative from 01/22. No changes to plan of care at this time. Pt is concerned about discharge date due to these changes.

## 2019-09-16 NOTE — Plan of Care (Signed)
  Problem: RH Problem Solving Goal: LTG Patient will demonstrate problem solving for (SLP) Description: LTG:  Patient will demonstrate problem solving for basic/complex daily situations with cues  (SLP) 09/16/2019 1534 by Charolett Bumpers, [place] (Taken 09/16/2019 1534) LTG: Patient will demonstrate problem solving for (SLP): Basic daily situations LTG Patient will demonstrate problem solving for: (downgraded due to changes in alertness) Maximal Assistance - Patient 25 - 49% Note: Downgraded due to changes in alertness  09/16/2019 0755 by Charolett Bumpers, [place] (Taken 09/16/2019 [id number]) LTG Patient will demonstrate problem solving for: (downgraded due to lack of progress and reduced participation from fatigue) Supervision Note: Downgraded due to lack of progress and reduced participation from fatigue    Problem: RH Memory Goal: LTG Patient will demonstrate ability for day to day (SLP) Description: LTG:   Patient will demonstrate ability for day to day recall/carryover during cognitive/linguistic activities with assist  (SLP) Flowsheets Taken 09/16/2019 1534 by Charolett Bumpers, CCC-SLP LTG: Patient will demonstrate ability for day to day recall/carryover during cognitive/linguistic activities with assist (SLP): (downgraded due to changes in alertness) Maximal Assistance - Patient 25 - 49% Taken 09/04/2019 1600 by Buzzy Han, CCC-SLP LTG: Patient will demonstrate ability for day to day recall: New information Note: Downgraded due to changes in alertness

## 2019-09-16 NOTE — Plan of Care (Signed)
  Problem: RH Problem Solving Goal: LTG Patient will demonstrate problem solving for (SLP) Description: LTG:  Patient will demonstrate problem solving for basic/complex daily situations with cues  (SLP) Flowsheets (Taken 09/16/2019 0755) LTG Patient will demonstrate problem solving for: (downgraded due to lack of progress and reduced participation from fatigue) Supervision Note: Downgraded due to lack of progress and reduced participation from fatigue

## 2019-09-17 ENCOUNTER — Inpatient Hospital Stay (HOSPITAL_COMMUNITY): Payer: Medicare Other

## 2019-09-17 ENCOUNTER — Inpatient Hospital Stay (HOSPITAL_COMMUNITY): Payer: Medicare Other | Admitting: Occupational Therapy

## 2019-09-17 LAB — GLUCOSE, CAPILLARY: Glucose-Capillary: 123 mg/dL — ABNORMAL HIGH (ref 70–99)

## 2019-09-17 NOTE — Progress Notes (Signed)
Physical Therapy Weekly Progress Note  Patient Details  Name: [name] [name] MRN: [id number] Date of Birth: Oct 08, 1955  Beginning of progress report period: September 04, 2019 End of progress report period: September 17, 2019  Today's Date: 09/17/2019 PT Individual Time: 1300-1400 PT Individual Time Calculation (min): 60 min  and Today's Date: 09/17/2019 PT Missed Time: 15 Minutes Missed Time Reason: Patient fatigue  Patient has met 0 of 3 short term goals. Pt with recent decline in functional mobility and increased fatigue limiting participation in therapy. Pt currently requires mod A for bed mobility, mod A for sit<>stand transfers with RW, and mod A for stand<>pivot transfers with RW. Pt previously ambulating 74f with RW mod A, however with recent decline in function pt unable to ambulate due to fatigue, L hemiparesis, and poor motor control/planning. Pt does have L AFO.   Patient continues to demonstrate the following deficits muscle weakness, impaired timing and sequencing, unbalanced muscle activation, decreased coordination and decreased motor planning and decreased sitting balance, decreased standing balance, decreased postural control, hemiplegia and decreased balance strategies and therefore will continue to benefit from skilled PT intervention to increase functional independence with mobility.  Patient progressing toward long term goals..  Continue plan of care.  PT Short Term Goals Week 2:  PT Short Term Goal 1 (Week 2): Pt will perform bed<>chair transfer with LRAD CGA PT Short Term Goal 1 - Progress (Week 2): Progressing toward goal PT Short Term Goal 2 (Week 2): Pt will ambulate 53fwith LRAD CGA PT Short Term Goal 2 - Progress (Week 2): Progressing toward goal PT Short Term Goal 3 (Week 2): Pt will perform car transfer with LRAD CGA PT Short Term Goal 3 - Progress (Week 2): Progressing toward goal Week 3:  PT Short Term Goal 1 (Week 3): Pt will perform bed<>chair transfer with  LRAD CGA PT Short Term Goal 2 (Week 3): Pt will ambulate 5026fith LRAD CGA PT Short Term Goal 3 (Week 3): Pt will perform car transfer with LRAD CGA  Skilled Therapeutic Interventions/Progress Updates:  Ambulation/gait training;Discharge planning;Functional mobility training;Psychosocial support;Therapeutic Activities;Visual/perceptual remediation/compensation;Balance/vestibular training;Disease management/prevention;Neuromuscular re-education;Skin care/wound management;Therapeutic Exercise;Wheelchair propulsion/positioning;Cognitive remediation/compensation;DME/adaptive equipment instruction;Pain management;Splinting/orthotics;UE/LE Strength taining/ROM;Community reintegration;Functional electrical stimulation;Patient/family education;Stair training;UE/LE Coordination activities   Today's Interventions: Received pt sitting in WC, pt agreeable to therapy, and denied any pain during today's session. Pt very lethargic throughout session closing eyes frequently and requiring max verbal cues to remain awake. Session focused on functional mobility/transfers, dynamic sitting/standing balance, LE strength, and improved endurance with activity. Therapist donned shoes and L AFO max A. [facility]. Pt attempted stand<>pivot transfer with RW and able to stand with mod A, however once standing pt with poor motor planning and unable to initiate turning despite max verbal cues. Pt transferred WC<>mat with Stedy total assist. Pt with strong L lateral lean requiring mod A to correct. Pt able to hold self up with R UE for approximately 30 seconds before requiring mod A to prevent falling to L. Pt transferred sit<>stand with RW x 2 trials mod A. In standing pt with strong L lateral lean requiring max verbal cues to correct. Pt with eyes closed constantly throughout session, requiring max verbal cues to open and focus on task. Pt requested to return to room and transferred mat<>WC  with Stedy total assist. [facility] Pt transferred WC<>bed stand<>pivot without AD mod A and sit<>supine mod A. Pt requested pants  off and therapist doffed pants mod A. Pt donned L hand splint and L PRAFO boot. Concluded session with pt supine in bed, needs within reach, and bed alarm on. Missed 15 minutes of skilled physical therapy due to fatigue.   Therapy Documentation Precautions:  Precautions Precautions: Fall Precaution Comments: L hemiparesis Restrictions Weight Bearing Restrictions: No  Therapy/Group: Individual Therapy Alfonse Alpers PT, DPT   09/17/2019, 7:44 AM

## 2019-09-17 NOTE — Progress Notes (Addendum)
Pt c/o lower back pain. Will verify with pharmacy that okay to give Tylenol as believe opioids and NSAIDS affect study drug.  Called pharmacy, will confirm that Tylenol okay to give and call me back. Main pharmacy called back confirmed Tylenol okay, NSAIDS are not okay and some stronger inhibitors but based on how metabolized.

## 2019-09-17 NOTE — Progress Notes (Signed)
Speech Language Pathology Daily Session Note  Patient Details  Name: [name] [name] MRN: [id number] Date of Birth: August 04, 1956  Today's Date: 09/17/2019 SLP Individual Time: 1100-1128 SLP Individual Time Calculation (min): 28 min  Short Term Goals: Week 3: SLP Short Term Goal 1 (Week 3): STG=LTG due to short ELOS  Skilled Therapeutic Interventions: Skilled ST services focused on cognitive skills. Pt demonstrated overall improvement of alertness, sustained attention and basic problem solving compared to yesterday's session, however continued drastic difference in skills level from the beginning of last week. SLP facilitated sustained attention, recall and basic problem solving in card sorting tasks by 6 colors and 6 shapes (with various colors/shapes/numbers of shapes.) Pt demonstrated sustain attention in 3 minute intervals increasing to 5 minute intervals with min A verbal redirection cues, mod I recall of requested sorting types and basic problem solving. Pt continued to demonstrate reduced alertness with eye closing intermittently, but deines lethargy. SLP will wait to adjust goals once demonstration of skills are consistent. Pt was left in room with call bell within reach and chair alarm set. ST recommends to continue skilled ST services.   Pain Pain Assessment Pain Score: 0-No pain  Therapy/Group: Individual Therapy  [name]  [facility] 09/17/2019, 11:52 AM

## 2019-09-17 NOTE — Progress Notes (Signed)
[facility] PHYSICAL MEDICINE & REHABILITATION PROGRESS NOTE  Subjective/Complaints: Patient seen sitting up in bed this morning eating breakfast.  He states he slept well overnight.  Interactions are limited this morning, patient states that he is doing "fine".  He was seen by ID yesterday, appreciate recs.  ROS: Denies CP, SOB, N/V/D  Objective: Vital Signs: Blood pressure 136/79, pulse 73, temperature 99.3 F (37.4 C), resp. rate 16, height 5\' 7"  (1.702 m), weight 73.8 kg, SpO2 99 %. No results found. Recent Labs    09/16/19 0840  WBC 2.9*  HGB 10.6*  HCT 31.5*  PLT 314   Recent Labs    09/16/19 0840  NA 132*  K 4.0  CL 95*  CO2 26  GLUCOSE 178*  BUN 13  CREATININE 1.09  CALCIUM 10.0    Physical Exam: BP 136/79 (BP Location: Right Arm)   Pulse 73   Temp 99.3 F (37.4 C)   Resp 16   Ht 5\' 7"  (1.702 m)   Wt 73.8 kg   SpO2 99%   BMI 25.48 kg/m   Constitutional: No distress . Vital signs reviewed. HENT: Normocephalic.  Atraumatic. Eyes: EOMI. No discharge. Cardiovascular: No JVD. Respiratory: Normal effort.  No stridor. GI: Non-distended. Skin: Warm and dry.  Intact. Psych: Normal mood.  Normal behavior. Musc: No edema in extremities.  No tenderness in extremities. Neuro: Alert Motor:  LUE: shoulder abduction 1+/5, 0/5 distally, unchanged LLE: Hip flexion, knee extension 1+/5, ankle dorsiflexion 0/5, unchanged No tone increase  Assessment/Plan: 1. Functional deficits secondary to right paramedian pontine infarct which require 3+ hours per day of interdisciplinary therapy in a comprehensive inpatient rehab setting.  Physiatrist is providing close team supervision and 24 hour management of active medical problems listed below.  Physiatrist and rehab team continue to assess barriers to discharge/monitor patient progress toward functional and medical goals  Care Tool:  Bathing    Body parts bathed by patient: Left arm, Chest, Abdomen, Front perineal  area, Right upper leg, Left upper leg, Right lower leg, Left lower leg, Face   Body parts bathed by helper: Right arm, Left arm, Chest, Abdomen, Front perineal area, Buttocks     Bathing assist Assist Level: Maximal Assistance - Patient 24 - 49%     Upper Body Dressing/Undressing Upper body dressing   What is the patient wearing?: Pull over shirt    Upper body assist Assist Level: Maximal Assistance - Patient 25 - 49%    Lower Body Dressing/Undressing Lower body dressing      What is the patient wearing?: Pants     Lower body assist Assist for lower body dressing: 2 Helpers     Toileting Toileting Toileting Activity did not occur (Clothing management and hygiene only): N/A (no void or bm)  Toileting assist Assist for toileting: Maximal Assistance - Patient 25 - 49% Assistive Device Comment: urinal   Transfers Chair/bed transfer  Transfers assist  Chair/bed transfer activity did not occur: Safety/medical concerns  Chair/bed transfer assist level: Dependent - mechanical lift     Locomotion Ambulation   Ambulation assist      Assist level: Moderate Assistance - Patient 50 - 74% Assistive device: Walker-rolling Max distance: 94ft   Walk 10 feet activity   Assist  Walk 10 feet activity did not occur: Safety/medical concerns(decreased strength/activity tolerance)  Assist level: Moderate Assistance - Patient - 50 - 74% Assistive device: Walker-rolling, Orthosis   Walk 50 feet activity   Assist Walk 50 feet with 2 turns  activity did not occur: Safety/medical concerns(decreased strength/activity tolerance)  Assist level: Moderate Assistance - Patient - 50 - 74% Assistive device: Walker-rolling, Orthosis    Walk 150 feet activity   Assist Walk 150 feet activity did not occur: Safety/medical concerns(decreased strength/activity tolerance)         Walk 10 feet on uneven surface  activity   Assist Walk 10 feet on uneven surfaces activity did not  occur: Safety/medical concerns(decreased strength/activity tolerance)         Wheelchair     Assist Will patient use wheelchair at discharge?: Yes Type of Wheelchair: Manual Wheelchair activity did not occur: Safety/medical concerns(Unable without skilled intervention, L hemi)  Wheelchair assist level: Supervision/Verbal cueing Max wheelchair distance: 144ft    Wheelchair 50 feet with 2 turns activity    Assist    Wheelchair 50 feet with 2 turns activity did not occur: (Unable without skilled intervention, L hemi)   Assist Level: Supervision/Verbal cueing   Wheelchair 150 feet activity     Assist Wheelchair 150 feet activity did not occur: (Unable without skilled intervention, L hemi)   Assist Level: Supervision/Verbal cueing      Medical Problem List and Plan: 1.  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  Antithrombotics: -DVT/anticoagulation: Lovenox             -antiplatelet therapy: Plavix 75 mg daily patient placed in [employer] factor XI inhibitor stroke prevention trial 3. Pain Management: Tylenol as needed  Voltaren gel changed to muscle rub right shoulder after discussion with neurology 4. Mood: Provide emotional support             -antipsychotic agents: N/A 5. Neuropsych: This patient is capable of making decisions on his own behalf. 6. Skin/Wound Care: Routine skin checks 7. Fluids/Electrolytes/Nutrition: Routine in and outs 8.  Chronic hyponatremia.  Low sodium in September through November 2020.    IVFs 100cc/hr d/ced  Na 132 on 1/25 9.  CLL/small B-cell lymphoma.  Follow-up Dr.Kale.  Patient last received chemotherapy October 2020. 10.  Type 2 diabetes mellitus with hyperglycemia.  Hemoglobin A1c 5.9.  Glucophage currently held.  Continue sliding scale  Relatively controlled on 1/26  Monitor with increased mobility 11.   Hyperlipidemia.  Lipitor 12.  History of hypertension.    On no medications at present  Controlled on 1/26  Monitor with increased mobility 13. Mild Spasticity:  Cont to monitor  Controlled at present without medication-continue range of motion 14. OSA: refuses to wear CPAP 15. Hypoalbuminemia  Supplement initiated on 1/13 16. Leukopenia  WBCs 2.9 on 1/25  See #9  Cont to monitor 17. Acute on chronic anemia  Hb 10.6 on 1/25 18. Fever:   Persistent intermittent low-grade fevers  UA negative, urine culture negative  Blood cultures no growth FINAL  Chest x-ray on 1/19 reviewed, showing stable opacity, prolonged discussion with Heme/Onc regarding lymphoma and opacity-suggesting central origin.  Appreciate ID recs [id number].  Sleep disturbance  Ambien 10  Improving  LOS: 14 days A FACE TO FACE EVALUATION WAS PERFORMED  Srihith Aquilino Lorie Phenix 09/17/2019, 8:08 AM

## 2019-09-17 NOTE — Progress Notes (Signed)
Occupational Therapy Session Note  Patient Details  Name: [name] [name] MRN: [id number] Date of Birth: 1956-03-09  Today's Date: 09/17/2019 OT Individual Time: [id number] OT Individual Time Calculation (min): 72 min    Short Term Goals: Week 2:  OT Short Term Goal 1 (Week 2): Pt will recall hemi dressing techniques wiht no VC to don shirt OT Short Term Goal 2 (Week 2): Pt will manage LUE prior to transfers to demo improved L attention OT Short Term Goal 3 (Week 2): Pt will complete toilet transfer with Min assist, ambulating with RW OT Short Term Goal 4 (Week 2): Pt will complete LB dressing with min assist (minus Lt shoe/AFO)  Skilled Therapeutic Interventions/Progress Updates:    Treatment session with focus on arousal, sitting balance, and participation in self-care tasks.  Pt received supine in bed, asleep.  Pt required increased time, tactile cues, and cold cloth to face to arouse.  Max assist bed mobility to come to sitting at EOB.  When pt maintaining RUE on bed rail, pt pushing to Lt.  When directed to place Rt hand in lap to attempt to decrease pushing, pt with lateral and posterior lean to Lt with inability to correct.  Engaged in [place] at sit > stand level at EOB, requiring total assist and mod-max assist to maintain sitting balance.  Completed sit > stand at EOB with mod assist for therapist to pull pants over hips.  Required use of Stedy for transfer OOB due to pushing and decreased motor planning with attempts at transfers.  Min-mod assist sit> stand in [place].  Engaged in oral care and UB bathing/dressing seated at sink with mod assist for sitting balance and positioning of LUE.  Pt remained upright in w/c with seat belt alarm on, half lap tray on for positioning, and all needs in reach.  Therapy Documentation Precautions:  Precautions Precautions: Fall Precaution Comments: L hemiparesis Restrictions Weight Bearing Restrictions: No Pain: Pain Assessment Pain Score: 0-No  pain   Therapy/Group: Individual Therapy  Simonne Come 09/17/2019, 12:31 PM

## 2019-09-18 ENCOUNTER — Inpatient Hospital Stay (HOSPITAL_COMMUNITY): Payer: Medicare Other | Admitting: Occupational Therapy

## 2019-09-18 ENCOUNTER — Inpatient Hospital Stay (HOSPITAL_COMMUNITY): Payer: Medicare Other

## 2019-09-18 DIAGNOSIS — R05 Cough: Secondary | ICD-10-CM

## 2019-09-18 DIAGNOSIS — D72819 Decreased white blood cell count, unspecified: Secondary | ICD-10-CM

## 2019-09-18 LAB — GLUCOSE, CAPILLARY: Glucose-Capillary: 128 mg/dL — ABNORMAL HIGH (ref 70–99)

## 2019-09-18 NOTE — Progress Notes (Signed)
Physical Therapy Session Note  Patient Details  Name: [name] [name] MRN: [id number] Date of Birth: 03-28-1956  Today's Date: 09/18/2019 PT Individual Time: [id number] PT Individual Time Calculation (min): 65 min  and Today's Date: 09/18/2019 PT Missed Time: 10 Minutes Missed Time Reason: Patient fatigue  Short Term Goals: Week 2:  PT Short Term Goal 1 (Week 2): Pt will perform bed<>chair transfer with LRAD CGA PT Short Term Goal 1 - Progress (Week 2): Progressing toward goal PT Short Term Goal 2 (Week 2): Pt will ambulate 68ft with LRAD CGA PT Short Term Goal 2 - Progress (Week 2): Progressing toward goal PT Short Term Goal 3 (Week 2): Pt will perform car transfer with LRAD CGA PT Short Term Goal 3 - Progress (Week 2): Progressing toward goal Week 3:  PT Short Term Goal 1 (Week 3): Pt will perform bed<>chair transfer with LRAD CGA PT Short Term Goal 2 (Week 3): Pt will ambulate 47ft with LRAD CGA PT Short Term Goal 3 (Week 3): Pt will perform car transfer with LRAD CGA  Skilled Therapy Received pt sitting on toilet with NT present, therapist took over with care. Pt agreeable to therapy, and denied any pain throughout session. Pt more alert today; however demonstrated increased fatigue with activity. Session focused on functional mobility/transfers, dynamic sitting/standing balance, L NMR, and improved activity tolerance. Therapist doffed soiled brief and donned clean biref and pants with pt sitting on toilet total assist. Pt with strong L lateral lean when sitting on toilet requiring max A and max verbal cues to correct. Pt transferred sit<>stand on Stedy mod A. Pt required total assist for peri-care. Pt required total assist to pull pants over hips standing in [place]. Pt then reported he was not finished using the bathroom and transferred stand<>sit mod A. Therapy tech arrived and assisted with toileting. Therapist donned another clean brief total assist. Pt transferred sit<>stand mod A on Stedy  and required +2 assistance to bring brief and pants over hips while preventing L lateral lean. Therapist donned L AFO total assist. Pt transported to gym in [facility] for time management purposes. Pt transferred stand<>pivot WC<>mat with RW mod/max A x 2 trials throughout session. Pt worked on dynamic sitting balance and midline orientation with min A/CGA using mirror for visual feedback. Pt unable to find midline, despite therapist placing tape along the mirror for cues. Pt stated "where is the tape?" and was unable to identify tape and color of tape on mirror. Pt transferred sit<>stand with RW mod A x 3 trials during session. Pt required +2 assistance to correct L lateral lean and manual facilitation to extend L knee and weight shift to R. Pt with increased fatigue evidenced by muscle shaking and requesting to sit down. Pt with increased fatigue and difficulty keeping eyes open towards end of session. Pt requested to remain sitting up despite fatigue. Concluded session with pt sitting in WC, needs within reach, L UE lap tray on, and seatbelt alarm on. Therapist provided drink to patient. 10 minutes missed of skilled physical therapy due to patient fatigue.   Therapy Documentation Precautions:  Precautions Precautions: Fall Precaution Comments: L hemiparesis Restrictions Weight Bearing Restrictions: No   Therapy/Group: Individual Therapy Alfonse Alpers PT, DPT   09/18/2019, 7:34 AM

## 2019-09-18 NOTE — Progress Notes (Signed)
[facility] for Infectious Disease  Date of Admission:  09/03/2019      Total days of antibiotics 0           ASSESSMENT: [name] [name] is a 64 y.o. male currently admitted to rehab department following a recent acute CVA. 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   He has had chills for a month and has had low grade fevers 100-101 F with leukopenia throughout present admission. Right middle lobe nodular opacity identified on chest imaging; previously similar finding in the left lung has resolved without intervention.   With pulmonary findings, cough history and likely impending chemotherapy would like to check sputum for routine culture, fungal and AFB however he is is unable to produce a sample.  Given the limited outpatient elective pulmonology procedures due to [facility] inpatient needs, would ask Pulmonology if we can perform bronchoscopy for BAL during inpatient stay.  With this being a second nodular opacity identified would like to ensure no atypical infectious process is going on before he is to resume chemotherapy.     PLAN: 1. Please consult pulmonology for bronch/BAL during current admission.     Principal Problem:   Right pontine CVA (HCC) Active Problems:   Acute on chronic anemia   Lymphopenia   Hypoalbuminemia due to protein-calorie malnutrition (HCC)   Controlled type 2 diabetes mellitus with hyperglycemia, without long-term current use of insulin (HCC)   Benign essential HTN   Labile blood glucose   Fever   Reactive depression   Opacity of lung on imaging study   History of hypertension   Sleep disturbance   . atorvastatin  20 mg Oral Daily  . clopidogrel  75 mg Oral Daily  . enoxaparin (LOVENOX) injection  40 mg Subcutaneous Q24H  . feeding supplement (PRO-STAT SUGAR FREE 64)  30 mL Oral BID  . insulin aspart  0-9 Units  Subcutaneous QAC breakfast  . BAY [id number] or placebo ([place])  1 tablet Oral Daily   And  . BAY [id number] or placebo ([place])  1 tablet Oral Daily    SUBJECTIVE: More awake today - does report a non-productive cough and ongoing chills.    Review of Systems: Review of Systems  Constitutional: Positive for chills and fever. Negative for malaise/fatigue.  Respiratory: Positive for cough. Negative for sputum production and shortness of breath.     Allergies  Allergen Reactions  . Lisinopril Itching  . Tuberculin Tests Swelling    OBJECTIVE: Vitals:   09/17/19 0544 09/17/19 1405 09/17/19 2017 09/18/19 0535  BP: 136/79 127/72 123/79 (!) 141/76  Pulse: 73 69 73 70  Resp: _0 Temp: 99.3 F (37.4 C) 99.2 F (37.3 C) 99.8 F (37.7 C) 100 F (37.8 C)  TempSrc:    Oral  SpO2: 99% 100% 98% 99%  Weight:      Height:       Body mass index is 25.48 kg/m.  Physical Exam Constitutional:      Appearance: He is well-developed.     Comments: Seated in wheelchair. No distress.   HENT:     Mouth/Throat:     Dentition: Normal dentition. No dental abscesses.  Cardiovascular:     Rate and Rhythm: Normal rate and regular rhythm.     Heart sounds: Normal heart sounds.  Pulmonary:  Effort: Pulmonary effort is normal.     Breath sounds: Normal breath sounds.  Abdominal:     General: There is no distension.     Palpations: Abdomen is soft.     Tenderness: There is no abdominal tenderness.  Musculoskeletal:     Comments: L hemiparalysis   Lymphadenopathy:     Cervical: No cervical adenopathy.  Skin:    General: Skin is warm and dry.     Findings: No rash.  Neurological:     Mental Status: He is alert and oriented to person, place, and time.  Psychiatric:        Judgment: Judgment normal.     Comments: More awake and interactive     Lab Results Lab Results  Component Value Date   WBC 2.9 (L) 09/16/2019   HGB 10.6 (L) 09/16/2019   HCT 31.5 (L)  09/16/2019   MCV 72.1 (L) 09/16/2019   PLT 314 09/16/2019    Lab Results  Component Value Date   CREATININE 1.09 09/16/2019   BUN 13 09/16/2019   NA 132 (L) 09/16/2019   K 4.0 09/16/2019   CL 95 (L) 09/16/2019   CO2 26 09/16/2019    Lab Results  Component Value Date   ALT 8 09/04/2019   AST 16 09/04/2019   ALKPHOS 49 09/04/2019   BILITOT 0.7 09/04/2019     Microbiology: Recent Results (from the past 240 hour(s))  Culture, blood (routine x 2)     Status: None   Collection Time: 09/09/19 11:35 AM   Specimen: BLOOD  Result Value Ref Range Status   Specimen Description BLOOD RIGHT ANTECUBITAL  Final   Special Requests   Final    BOTTLES DRAWN AEROBIC AND ANAEROBIC Blood Culture results may not be optimal due to an inadequate volume of blood received in culture bottles   Culture   Final    NO GROWTH 5 DAYS Performed at [facility], [place] [address], [place], [place] [address]    Report Status 09/14/2019 FINAL  Final  Culture, blood (routine x 2)     Status: None   Collection Time: 09/09/19 11:43 AM   Specimen: BLOOD LEFT HAND  Result Value Ref Range Status   Specimen Description BLOOD LEFT HAND  Final   Special Requests   Final    BOTTLES DRAWN AEROBIC ONLY Blood Culture results may not be optimal due to an inadequate volume of blood received in culture bottles   Culture   Final    NO GROWTH 5 DAYS Performed at [facility], [place] [address], [place], [place] [address]    Report Status 09/14/2019 FINAL  Final  Culture, Urine     Status: None   Collection Time: 09/09/19  3:23 PM   Specimen: Urine, Clean Catch  Result Value Ref Range Status   Specimen Description URINE, CLEAN CATCH  Final   Special Requests Immunocompromised  Final   Culture   Final    NO GROWTH Performed at [facility], [place] [address], [place], [place] [address]    Report Status 09/10/2019 FINAL  Final    Janene Madeira, MSN, NP-C [facility] [email or web address] Pager: [phone] Office: [phone] [place]: [phone]

## 2019-09-18 NOTE — Patient Care Conference (Signed)
Inpatient RehabilitationTeam Conference and Plan of Care Update Date: 09/18/2019   Time: 11:35 AM    Patient Name: [name] [name]      Medical Record Number: [id number]  Date of Birth: Dec 30, 1955 Sex: Male         Room/Bed: 4W07C/4W07C-01 Payor Info: Payor: [employer] MEDICARE / Plan: [facility] MEDICARE / Product Type: *No Product type* /    Admit Date/Time:  09/03/2019  3:09 PM  Primary Diagnosis:  Right pontine CVA [facility])  Patient Active Problem List   Diagnosis Date Noted  . Sleep disturbance   . History of hypertension   . Opacity of lung on imaging study   . Fever   . Reactive depression   . Benign essential HTN   . Labile blood glucose   . Acute on chronic anemia   . Lymphopenia   . Hypoalbuminemia due to protein-calorie malnutrition ([place])   . Controlled type 2 diabetes mellitus with hyperglycemia, without long-term current use of insulin ([place])   . Right pontine CVA ([place]) 09/03/2019  . Acute ischemic stroke ([place]) 08/29/2019  . Hyponatremia 08/29/2019  . HTN (hypertension) 08/29/2019  . DM2 (diabetes mellitus, type 2) ([place]) 08/29/2019  . CLL (chronic lymphocytic leukemia) ([place]) 04/10/2019  . Counseling regarding advance care planning and goals of care 04/10/2019  . Deviated septum 05/18/2018  . Iron deficiency anemia 02/26/2018    Expected Discharge Date: Expected Discharge Date: 09/28/19  Team Members Present: Physician leading conference: Dr. Delice Lesch Social Worker Present: Lennart Pall, LCSW Nurse Present: Dorien Chihuahua, RN;Karen Lovena Neighbours, RN Case Manager: Karene Fry, RN PT Present: Becky Sax, PT OT Present: Simonne Come, OT SLP Present: Jettie Booze, CF-SLP PPS Coordinator present : Ileana Ladd, PT     Current Status/Progress Goal Weekly Team Focus  Bowel/Bladder   Past few days pt has become incontinent of bowel and bladder due to increased drowsiness  Decrease incontinent episodes  Toilet every 2 hours/PRN   Swallow/Nutrition/ Hydration              ADL's   Max assist transfers (using [facility] for safety), Mod-max assist bathing, Max -total assist LB dressing, increased Lt lean and pushing with RUE  Supervision overall - may have to downgrade goals as pt with increased Lt weakness and decreased arousal/participation  ADL retraining, LUE NMR, transfers, dynamic sitting and standing balance, pt/family education   Mobility   bed mobility mod A, transfers mod A with/without AD, no ambulation recently due to fatigue, poor motor planning, and L LE weakness  supervision with LRAD, min A 4 steps  functional mobility/transfers, attention/awareness, LE strength, dynamic sitting/standing balance, improved endurance   Communication             Safety/Cognition/ Behavioral Observations  total-max A  Max A - downgraded on 1/25 due to change in ability/medical change?  basic problem solving, recall and sustained attention   Pain   Patient denies pain at this time  Remain pain free  Assess pain every shift/PRN   Skin   Skin intact  Prevent skin breakdown  Assess skin every shift    Rehab Goals Patient on target to meet rehab goals: No Rehab Goals Revised: Downgraded goals 2/2 medical issues/fatigue *See Care Plan and progress notes for long and short-term goals.     Barriers to Discharge  Current Status/Progress Possible Resolutions Date Resolved   Nursing                  PT  Medical stability  Pt with increased fatigue limiting participation in therapy              OT                  SLP                SW Decreased caregiver support;Lack of/limited family support Son works 6p-6a, mother at patient's home has aide assistance a couple of hours a day Set to discharge home with son, ramped entrance to home, son works 6p-6a          Discharge Planning/Teaching Needs:  Discharging home with son who works 6p-6a  Transfers, medications, DM monitoring, B+D assistance   Team Discussion: Intermittent fevers, consulted ID, CT done Friday.  RN  change over weekend, cont/inc, timed toileting, BM 1/24, concerned about DC, back pain, given tylenol, BS trending up.  OT significant change since last Wednesday, L lean, mod/max A, less awake, using steady, stand mod A, max A self care.  PT mod bed, transfers mod/max, BM today, cannot motor plan, S goals, min A goals stairs, fatigue, visual deficits.  SLP downgrade goals, max A basic tasks.   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 Weekly Focus/Goal: Improve mobility, leucopenia, fevers, ?dysphagia  Barriers to Discharge: Medical stability;Other (comments)  Barriers to Discharge Comments: Intermittent fever Possible Resolutions to Barriers: Therapies, follow labs, cont follow up with ID regarding fevers   Continued Need for Acute Rehabilitation Level of Care: The patient requires daily medical management by a physician with specialized training in physical medicine and rehabilitation for the following reasons: Direction of a multidisciplinary physical rehabilitation program to maximize functional independence : Yes Medical management of patient stability for increased activity during participation in an intensive rehabilitation regime.: Yes Analysis of laboratory values and/or radiology reports with any subsequent need for medication adjustment and/or medical intervention. : Yes   I attest that I was present, lead the team conference, and concur with the assessment and plan of the team.   Retta Diones 09/18/2019, 9:50 PM   Team conference was held via web/ teleconference due to [place] - 19

## 2019-09-18 NOTE — Progress Notes (Signed)
Occupational Therapy Weekly Progress Note  Patient Details  Name: [name] [name] MRN: [id number] Date of Birth: 1956-07-16  Beginning of progress report period: September 11, 2019 End of progress report period: September 18, 2019  Today's Date: 09/18/2019 OT Individual Time: 1345-1456 OT Individual Time Calculation (min): 71 min    Patient has met 0 of 4 short term goals.  Pt has made little to no progress this reporting period due to decreased participation secondary to increased lethargy.  Pt has demonstrated increased lethargy with increased difficulty maintaining arousal to engage in therapy sessions.  Pt is currently demonstrating increased pushing with RUE and Lt lateral and posterior lean in unsupported sitting.  Pt is more flat and disengaged during therapy sessions.  Pt currently requires mod-max assist for sit > stand and max assist for stand pivot transfers.  Have been utilizing mechanical lift and +2 assist for safety due to decreased initiation and sequencing during mobility.  CT of head on 1/22, negative.    Patient continues to demonstrate the following deficits: muscle weakness,decreased cardiorespiratoy endurance,unbalanced muscle activation, motor apraxia, decreased coordination and decreased motor planning,decreased visual perceptual skills and decreased visual motor skills,decreased attention to left,decreased problem solving, decreased safety awareness and decreased memoryand decreased sitting balance, decreased standing balance, decreased postural control, hemiplegia and decreased balance strategies  and therefore will continue to benefit from skilled OT intervention to enhance overall performance with BADL and Reduce care partner burden.  Patient progressing toward long term goals..  If patient does not improve, demonstrating increased arousal, soon then goals will need to be downgraded.  OT Short Term Goals Week 2:  OT Short Term Goal 1 (Week 2): Pt will recall hemi  dressing techniques wiht no VC to don shirt OT Short Term Goal 1 - Progress (Week 2): Not met OT Short Term Goal 2 (Week 2): Pt will manage LUE prior to transfers to demo improved L attention OT Short Term Goal 2 - Progress (Week 2): Not met OT Short Term Goal 3 (Week 2): Pt will complete toilet transfer with Min assist, ambulating with RW OT Short Term Goal 3 - Progress (Week 2): Not met OT Short Term Goal 4 (Week 2): Pt will complete LB dressing with min assist (minus Lt shoe/AFO) OT Short Term Goal 4 - Progress (Week 2): Not met Week 3:  OT Short Term Goal 1 (Week 3): Pt will recall hemi dressing techniques with no VC to don shirt with mod assist OT Short Term Goal 2 (Week 3): Pt will manage LUE prior to transfers to demo improved L attention OT Short Term Goal 3 (Week 3): Pt will complete toilet transfer with Mod assist OT Short Term Goal 4 (Week 3): Pt will complete LB dressing with mod assist  Skilled Therapeutic Interventions/Progress Updates:    Treatment session with focus on functional transfers, sitting balance, and LUE NMR.  Pt received upright in w/c agreeable to therapy session.  Pt reports need to toilet. Completed stand pivot transfer w/c > toilet with Max assist +2 for safety.  Pt with increased arousal, however required max assist and facilitation to advance LLE and weight shift during transfer.  +2 assist for clothing management in standing.  Engaged in stand pivot transfers w/c <> therapy mat with max assist and +2 for safety.  Engaged in [place] in sitting with 2nd person providing facilitation for trunk control and upright sitting posture, as pt continues to demonstrate Lt lean.  Provided tactile cues for upright posture.  Utilized UE Ranger in sitting with focus on shoulder flexion/extension with minimal activation this session.  Applied kinesiotape to shoulder and traps to provide additional support to Lt shoulder, as pt reports intermittent pain in shoulder and therapist noted  slight sublux.  Pt reports need to toilet again.  Returned to room and utilized Stedy to transfer on/off toilet as no +2 available.  Total assist for sit > stand and clothing management, pt did not void.  Returned to bed at end of session and left semi-reclined with all needs in reach.  Therapy Documentation Precautions:  Precautions Precautions: Fall Precaution Comments: L hemiparesis Restrictions Weight Bearing Restrictions: No General:   Vital Signs: Therapy Vitals Temp: 98.6 F (37 C) Temp Source: Oral Pulse Rate: 77 Resp: 16 BP: 127/64 Patient Position (if appropriate): Sitting Oxygen Therapy SpO2: 100 % O2 Device: Room Air Pain:  Pt with no c/o pain   Therapy/Group: Individual Therapy  Simonne Come 09/18/2019, 3:18 PM

## 2019-09-18 NOTE — Progress Notes (Signed)
Speech Language Pathology Daily Session Note  Patient Details  Name: [name] [name] MRN: [id number] Date of Birth: 12/11/1955  Today's Date: 09/18/2019 SLP Individual Time: 0732-0829 SLP Individual Time Calculation (min): 57 min  Short Term Goals: Week 3: SLP Short Term Goal 1 (Week 3): STG=LTG due to short ELOS  Skilled Therapeutic Interventions: SLP targeted cognitive skills in today's treatment session. SLP facilitated basic problem solving skills in familiar ALFA money management task, pt required continued max A verbal cues for thought organization and reduced mental flexibility in counting change as well as displaying requested amount. SLP also facilitated basic problem solving and sustained attention in novel PEG design task, to highlight error awareness with the ability to reflect on correct model displayed on picture cards. Pt demonstrated ability to complete basic problem solving designs with mod A verbal cues for problem solving and error awareness/correction. Pt continued to demonstrated reduced mental flexibility when attempting task independently, not asking for assistance when unable to stack PEG with use of RUE only or problem solving a different method for stacking. However pt did demonstrate increase insight of reduced ability/impairments from the first targeted task (counting money) to the second task (PEG design) following education. Pt demonstrated sustained attention in 5 minute intervals. Pt was left in room with call bell within reach and bed alarm set. SLP recommends to continue skilled services.      Pain Pain Assessment Pain Score: 0-No pain  Therapy/Group: Individual Therapy  Mahsa Hanser  [facility] 09/18/2019, 9:05 AM

## 2019-09-18 NOTE — Progress Notes (Addendum)
[facility] PHYSICAL MEDICINE & REHABILITATION PROGRESS NOTE  Subjective/Complaints: Patient seen sitting up in bed this AM.  He states he slept "sweet" overnight.  He is working with therapies. He has questions regarding discharge date.   ROS: Denies CP, SOB, N/V/D  Objective: Vital Signs: Blood pressure (!) 141/76, pulse 70, temperature 100 F (37.8 C), temperature source Oral, resp. rate 20, height 5\' 7"  (1.702 m), weight 73.8 kg, SpO2 99 %. No results found. Recent Labs    09/16/19 0840  WBC 2.9*  HGB 10.6*  HCT 31.5*  PLT 314   Recent Labs    09/16/19 0840  NA 132*  K 4.0  CL 95*  CO2 26  GLUCOSE 178*  BUN 13  CREATININE 1.09  CALCIUM 10.0    Physical Exam: BP (!) 141/76 (BP Location: Right Arm)   Pulse 70   Temp 100 F (37.8 C) (Oral)   Resp 20   Ht 5\' 7"  (1.702 m)   Wt 73.8 kg   SpO2 99%   BMI 25.48 kg/m   Constitutional: No distress . Vital signs reviewed. HENT: Normocephalic.  Atraumatic. Eyes: EOMI. No discharge. Cardiovascular: No JVD. Respiratory: Normal effort.  No stridor. GI: Non-distended. Skin: Warm and dry.  Intact. Psych: Normal mood.  Normal behavior. Musc: No edema in extremities.  No tenderness in extremities. Neuro: Alert Motor:  LUE: shoulder abduction 1+/5, 0/5 distally, stable LLE: Hip flexion, knee extension 1+/5, ankle dorsiflexion 0/5, stable No tone increase  Assessment/Plan: 1. Functional deficits secondary to right paramedian pontine infarct which require 3+ hours per day of interdisciplinary therapy in a comprehensive inpatient rehab setting.  Physiatrist is providing close team supervision and 24 hour management of active medical problems listed below.  Physiatrist and rehab team continue to assess barriers to discharge/monitor patient progress toward functional and medical goals  Care Tool:  Bathing    Body parts bathed by patient: Chest, Abdomen, Face   Body parts bathed by helper: Right arm, Left arm, Chest,  Abdomen, Front perineal area, Buttocks     Bathing assist Assist Level: Moderate Assistance - Patient 50 - 74%     Upper Body Dressing/Undressing Upper body dressing   What is the patient wearing?: Pull over shirt    Upper body assist Assist Level: Maximal Assistance - Patient 25 - 49%    Lower Body Dressing/Undressing Lower body dressing      What is the patient wearing?: Pants     Lower body assist Assist for lower body dressing: Total Assistance - Patient < 25%     Toileting Toileting Toileting Activity did not occur Landscape architect and hygiene only): N/A (no void or bm)  Toileting assist Assist for toileting: Maximal Assistance - Patient 25 - 49% Assistive Device Comment: urinal   Transfers Chair/bed transfer  Transfers assist  Chair/bed transfer activity did not occur: Safety/medical concerns  Chair/bed transfer assist level: Moderate Assistance - Patient 50 - 74%     Locomotion Ambulation   Ambulation assist      Assist level: Moderate Assistance - Patient 50 - 74% Assistive device: Walker-rolling Max distance: 98ft   Walk 10 feet activity   Assist  Walk 10 feet activity did not occur: Safety/medical concerns(decreased strength/activity tolerance)  Assist level: Moderate Assistance - Patient - 50 - 74% Assistive device: Walker-rolling, Orthosis   Walk 50 feet activity   Assist Walk 50 feet with 2 turns activity did not occur: Safety/medical concerns(decreased strength/activity tolerance)  Assist level: Moderate Assistance - Patient -  50 - 74% Assistive device: Walker-rolling, Orthosis    Walk 150 feet activity   Assist Walk 150 feet activity did not occur: Safety/medical concerns(decreased strength/activity tolerance)         Walk 10 feet on uneven surface  activity   Assist Walk 10 feet on uneven surfaces activity did not occur: Safety/medical concerns(decreased strength/activity tolerance)          Wheelchair     Assist Will patient use wheelchair at discharge?: Yes Type of Wheelchair: Manual Wheelchair activity did not occur: Safety/medical concerns(Unable without skilled intervention, L hemi)  Wheelchair assist level: Supervision/Verbal cueing Max wheelchair distance: 172ft    Wheelchair 50 feet with 2 turns activity    Assist    Wheelchair 50 feet with 2 turns activity did not occur: (Unable without skilled intervention, L hemi)   Assist Level: Supervision/Verbal cueing   Wheelchair 150 feet activity     Assist Wheelchair 150 feet activity did not occur: (Unable without skilled intervention, L hemi)   Assist Level: Supervision/Verbal cueing      Medical Problem List and Plan: 1.  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  2.  Antithrombotics: -DVT/anticoagulation: Lovenox             -antiplatelet therapy: Plavix 75 mg daily patient placed in [employer] factor XI inhibitor stroke prevention trial 3. Pain Management: Tylenol as needed  Voltaren gel changed to muscle rub right shoulder after discussion with neurology 4. Mood: Provide emotional support             -antipsychotic agents: N/A 5. Neuropsych: This patient is capable of making decisions on his own behalf. 6. Skin/Wound Care: Routine skin checks 7. Fluids/Electrolytes/Nutrition: Routine in and outs 8.  Chronic hyponatremia.  Low sodium in September through November 2020.    IVFs 100cc/hr d/ced  Na 132 on 1/25 9.  CLL/small B-cell lymphoma.  Follow-up Dr.Kale.  Patient last received chemotherapy October 2020. 10.  Type 2 diabetes mellitus with hyperglycemia.  Hemoglobin A1c 5.9.  Glucophage currently held.  Continue  sliding scale  Slightly elevated on 1/27  Monitor with increased mobility 11.  Hyperlipidemia.  Lipitor 12.  History of hypertension.    On no medications at present  Relatively controlled on 1/27  Monitor with increased mobility 13. Mild Spasticity:  Cont to monitor  Controlled at present without medication-continue range of motion 14. OSA: refuses to wear CPAP 15. Hypoalbuminemia  Supplement initiated on 1/13 16. Leukopenia  WBCs 2.9 on 1/25, plan for repeat labs end of this week  See #9  Cont to monitor 17. Acute on chronic anemia  Hb 10.6 on 1/25 18. Fever:   Persistent intermittent low-grade fevers  UA negative, urine culture negative  Blood cultures no growth FINAL  Chest x-ray on 1/19 reviewed, showing stable opacity, prolonged discussion with Heme/Onc regarding lymphoma and opacity-suggesting central origin.  Appreciate ID recs, awaiting further recs 19.  Sleep disturbance  Ambien 10 d/ced due to lethargy  Improved 20. ?Post stroke dysphagia  Await therapy eval for supervision  LOS: 15 days A FACE TO FACE EVALUATION WAS PERFORMED  Ginnie Marich Lorie Phenix 09/18/2019, 8:56 AM

## 2019-09-19 ENCOUNTER — Inpatient Hospital Stay (HOSPITAL_COMMUNITY): Payer: Medicare Other | Admitting: Speech Pathology

## 2019-09-19 ENCOUNTER — Ambulatory Visit (HOSPITAL_COMMUNITY): Payer: Medicare Other | Admitting: *Deleted

## 2019-09-19 ENCOUNTER — Inpatient Hospital Stay (HOSPITAL_COMMUNITY): Payer: Medicare Other

## 2019-09-19 LAB — GLUCOSE, CAPILLARY: Glucose-Capillary: 115 mg/dL — ABNORMAL HIGH (ref 70–99)

## 2019-09-19 NOTE — Progress Notes (Signed)
[place] PHYSICAL MEDICINE & REHABILITATION PROGRESS NOTE  Subjective/Complaints: Patient seen sitting up in bed this morning.  States he slept well overnight.  Discussed pulmonology consult with patient.  He was seen by ID yesterday, notes reviewed-recommending BAL.  ROS: Denies cough, CP, SOB, N/V/D  Objective: Vital Signs: Blood pressure 132/70, pulse 66, temperature 98.7 F (37.1 C), temperature source Oral, resp. rate 16, height _0  (1.702 m), weight 73.8 kg, SpO2 96 %. No results found. No results for input(s): WBC, HGB, HCT, PLT in the last 72 hours. No results for input(s): NA, K, CL, CO2, GLUCOSE, BUN, CREATININE, CALCIUM in the last 72 hours.  Physical Exam: BP 132/70 (BP Location: Right Arm)   Pulse 66   Temp 98.7 F (37.1 C) (Oral)   Resp 16   Ht _1  (1.702 m)   Wt 73.8 kg   SpO2 96%   BMI 25.48 kg/m   Constitutional: No distress . Vital signs reviewed. HENT: Normocephalic.  Atraumatic. Eyes: EOMI. No discharge. Cardiovascular: No JVD. Respiratory: Normal effort.  No stridor. GI: Non-distended. Skin: Warm and dry.  Intact. Psych: Normal mood.  Normal behavior. Musc: No edema in extremities.  No tenderness in extremities. Neuro: Alert Motor:  LUE: shoulder abduction 1+/5, 0/5 distally, unchanged  LLE: Hip flexion, knee extension 1+/5, ankle dorsiflexion 0/5, unchanged No tone increase  Assessment/Plan: 1. Functional deficits secondary to right paramedian pontine infarct which require 3+ hours per day of interdisciplinary therapy in a comprehensive inpatient rehab setting.  Physiatrist is providing close team supervision and 24 hour management of active medical problems listed below.  Physiatrist and rehab team continue to assess barriers to discharge/monitor patient progress toward functional and medical goals  Care Tool:  Bathing    Body parts bathed by patient: Chest, Abdomen, Face   Body parts bathed by helper: Right arm, Left arm, Chest,  Abdomen, Front perineal area, Buttocks     Bathing assist Assist Level: Moderate Assistance - Patient 50 - 74%     Upper Body Dressing/Undressing Upper body dressing   What is the patient wearing?: Pull over shirt    Upper body assist Assist Level: Maximal Assistance - Patient 25 - 49%    Lower Body Dressing/Undressing Lower body dressing      What is the patient wearing?: Pants, Incontinence brief     Lower body assist Assist for lower body dressing: Total Assistance - Patient < 25%     Toileting Toileting Toileting Activity did not occur (Clothing management and hygiene only): N/A (no void or bm)  Toileting assist Assist for toileting: Total Assistance - Patient < 25% Assistive Device Comment: urinal   Transfers Chair/bed transfer  Transfers assist  Chair/bed transfer activity did not occur: Safety/medical concerns  Chair/bed transfer assist level: Dependent - mechanical lift     Locomotion Ambulation   Ambulation assist      Assist level: Moderate Assistance - Patient 50 - 74% Assistive device: Walker-rolling Max distance: 71f   Walk 10 feet activity   Assist  Walk 10 feet activity did not occur: Safety/medical concerns(decreased strength/activity tolerance)  Assist level: Moderate Assistance - Patient - 50 - 74% Assistive device: Walker-rolling, Orthosis   Walk 50 feet activity   Assist Walk 50 feet with 2 turns activity did not occur: Safety/medical concerns(decreased strength/activity tolerance)  Assist level: Moderate Assistance - Patient - 50 - 74% Assistive device: Walker-rolling, Orthosis    Walk 150 feet activity   Assist Walk 150 feet activity did not  occur: Safety/medical concerns(decreased strength/activity tolerance)         Walk 10 feet on uneven surface  activity   Assist Walk 10 feet on uneven surfaces activity did not occur: Safety/medical concerns(decreased strength/activity tolerance)          Wheelchair     Assist Will patient use wheelchair at discharge?: Yes Type of Wheelchair: Manual Wheelchair activity did not occur: Safety/medical concerns(Unable without skilled intervention, L hemi)  Wheelchair assist level: Supervision/Verbal cueing Max wheelchair distance: 150f    Wheelchair 50 feet with 2 turns activity    Assist    Wheelchair 50 feet with 2 turns activity did not occur: (Unable without skilled intervention, L hemi)   Assist Level: Supervision/Verbal cueing   Wheelchair 150 feet activity     Assist Wheelchair 150 feet activity did not occur: (Unable without skilled intervention, L hemi)   Assist Level: Supervision/Verbal cueing      Medical Problem List and Plan: 1.  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 2.  Antithrombotics: -DVT/anticoagulation: Lovenox             -antiplatelet therapy: Plavix 75 mg daily patient placed in [employer] inhibitor stroke prevention trial 3. Pain Management: Tylenol as needed  Voltaren gel changed to muscle rub right shoulder after discussion with neurology 4. Mood: Provide emotional support             -antipsychotic agents: N/A 5. Neuropsych: This patient is capable of making decisions on his own behalf. 6. Skin/Wound Care: Routine skin checks 7. Fluids/Electrolytes/Nutrition: Routine in and outs 8.  Chronic hyponatremia.  Low sodium in September through November 2020.    IVFs 100cc/hr d/ced  Na 132 on 1/25, labs ordered for tomorrow 9.  CLL/small B-cell lymphoma.  Follow-up Dr.Kale.  Patient last received chemotherapy October 2020. 10.  Type 2 diabetes mellitus with hyperglycemia.  Hemoglobin A1c 5.9.  Glucophage currently held.  Continue sliding scale  Relatively controlled on 1/28  Monitor with increased mobility 11.  Hyperlipidemia.   Lipitor 12.  History of hypertension.    On no medications at present  Controlled on 1/28  Monitor with increased mobility 13. Mild Spasticity:  Cont to monitor  Controlled at present without medication-continue range of motion 14. OSA: refuses to wear CPAP 15. Hypoalbuminemia  Supplement initiated on 1/13 16. Leukopenia  WBCs 2.9 on 1/25, labs ordered for tomorrow  See #9  Cont to monitor 17. Acute on chronic anemia  Hb 10.6 on 1/25, labs ordered for tomorrow 18. Fever:   Persistent intermittent low-grade/borderline fevers  UA negative, urine culture negative  Blood cultures no growth FINAL  Chest x-ray on 1/19 reviewed, showing stable opacity, prolonged discussion with Heme/Onc regarding lymphoma and opacity-suggesting central origin.  Appreciate ID recs, recommending BAL.  Will consult pulmonology 19.  Sleep disturbance  Ambien 10 d/ced due to lethargy  Overall improved  LOS: 16 days A FACE TO FACE EVALUATION WAS PERFORMED  Ankit ALorie Phenix1/28/2021, 9:48 AM

## 2019-09-19 NOTE — Progress Notes (Signed)
Occupational Therapy Session Note  Patient Details  Name: [name] [name] MRN: [id number] Date of Birth: 08/03/1956  Today's Date: 09/19/2019 OT Individual Time: 1100-1155 OT Individual Time Calculation (min): 55 min    Short Term Goals: Week 3:  OT Short Term Goal 1 (Week 3): Pt will recall hemi dressing techniques with no VC to don shirt with mod assist OT Short Term Goal 2 (Week 3): Pt will manage LUE prior to transfers to demo improved L attention OT Short Term Goal 3 (Week 3): Pt will complete toilet transfer with Mod assist OT Short Term Goal 4 (Week 3): Pt will complete LB dressing with mod assist  Skilled Therapeutic Interventions/Progress Updates:    Pt resting in w/c upon arrival and agreeable to therapy. OT intervention with focus on sit<>stand with Stedy, sitting balance, LUE NMR (see below), problem solving, attention to L, and activity tolerance to increase independence with BADLs. Pt with significant posterior pelvic tilt with forward head in sitting unsupported for LUE NMR and weight bearing. Pt easily distracted in general gym. Following weight bearing pt engaged in stool work with focus on shoulder activation. Trace scapula protraction noted.  Pt transitioned to Day Room and challenged with assembling simple structure.  Pt required max verbal and demonstration cues to complete task. Pt also engaged in LUE task to initiate closed chain horizontal adduction. Pt not able to initiate horizontal abduction. Pt returned to room and remained in w/c.  All needs within reach, belt alarm activated, and half lap tray in place.  Therapy Documentation Precautions:  Precautions Precautions: Fall Precaution Comments: L hemiparesis Restrictions Weight Bearing Restrictions: No General:   Vital Signs:   Pain: Pt denies pain this morning   Other Treatments: Treatments Neuromuscular Facilitation: Left;Upper Extremity;Activity to increase motor control;Activity to increase  coordination;Activity to increase sustained activation;Activity to increase lateral weight shifting;Activity to increase anterior-posterior weight shifting Weight Bearing Technique Weight Bearing Technique: Yes LUE Weight Bearing Technique: Forearm seated;Extended arm standing   Therapy/Group: Individual Therapy  Leroy Libman 09/19/2019, 12:07 PM

## 2019-09-19 NOTE — Progress Notes (Signed)
Recreational Therapy Session Note  Patient Details  Name: [name] [name] MRN: [id number] Date of Birth: 05/19/1956 Today's Date: 09/19/2019 Time:  [id number] Pain: no c/o Skilled Therapeutic Interventions/Progress Updates: Session focused on activity tolerance, dynamic sitting balance, awareness of midline during co-treat with PT.  Pt transferred stand pivot to mat form w/c using RW with mod assist.  Once seated EOM, pt participated in modified fishing activity with emphasis on awareness of midline, sitting at midline, weight shifting to the right, PT supporting pt from behind on physioball.  Pt laughing and enjoying naming and catching fish.  Pt required max assist and instructional cues to perform stand pivot transfer to w/c at end of the session.     Therapy/Group: Co-Treatment   Coben Godshall 09/19/2019, 3:09 PM

## 2019-09-19 NOTE — Progress Notes (Signed)
Team Conference Report to Patient/Family  Team Conference discussion was reviewed with the patient and son, including goals still @ supervision with min assist for stairs, any changes in plan of care and target discharge date was extended due to progress and medical issues.  Patient is having STMD, fatigue, low grade FUO, left lateral leaning and still keeps eyes closed and is currently mod-max assist overall. Patient and caregiver express understanding and are in agreement.  The patient has a target discharge date of 09/28/19. With family education scheduled on Friday 09/20/19 @ 1300 with the son.  Dorien Chihuahua B 09/19/2019, 2:33 PM

## 2019-09-19 NOTE — Progress Notes (Signed)
Speech Language Pathology Daily Session Note  Patient Details  Name: [name] [name] MRN: [id number] Date of Birth: 21-Mar-1956  Today's Date: 09/19/2019 SLP Individual Time: [id number] SLP Individual Time Calculation (min): 12 min  Short Term Goals: Week 3: SLP Short Term Goal 1 (Week 3): STG=LTG due to short ELOS  Skilled Therapeutic Interventions:  Skilled treatment session targeted cognition goals. SLP recieved pt upright in wheelchair. Pt was asleep and despite Maximal multi-modal cues, pt not able to keep his eyes open. SLP presented to with checkers (game he wanted to re-play from previous week). Pt able to indicate that he wanted to play it during this session but was not able to d/t lethargy. Pt left upright in wheelchair with all needs within reach. Continue per current plan of care.        Pain Pain Assessment Pain Scale: 0-10 Pain Score: 0-No pain  Therapy/Group: Individual Therapy  Charita Lindenberger 09/19/2019, 9:55 AM

## 2019-09-19 NOTE — Progress Notes (Signed)
Physical Therapy Session Note  Patient Details  Name: [name] [name] MRN: [id number] Date of Birth: 02-01-1956  Today's Date: 09/19/2019 PT Individual Time: 0800-0900 and 1300-1345 PT Individual Time Calculation (min): 60 min and 45 min  Short Term Goals: Week 2:  PT Short Term Goal 1 (Week 2): Pt will perform bed<>chair transfer with LRAD CGA PT Short Term Goal 1 - Progress (Week 2): Progressing toward goal PT Short Term Goal 2 (Week 2): Pt will ambulate 23ft with LRAD CGA PT Short Term Goal 2 - Progress (Week 2): Progressing toward goal PT Short Term Goal 3 (Week 2): Pt will perform car transfer with LRAD CGA PT Short Term Goal 3 - Progress (Week 2): Progressing toward goal Week 3:  PT Short Term Goal 1 (Week 3): Pt will perform bed<>chair transfer with LRAD CGA PT Short Term Goal 2 (Week 3): Pt will ambulate 43ft with LRAD CGA PT Short Term Goal 3 (Week 3): Pt will perform car transfer with LRAD CGA  Skilled Therapeutic Interventions/Progress Updates:   Treatment Session 1: 0800-0900 60 min Received pt supine in bed, pt agreeable to therapy, and denied any pain during session. Session focused on functional mobility/transfers, LE strength, dynamic sitting/standing balance, NMR, and improved endurance with activity. Pt rolled to R with min A and use of bedrails and transferred R sidelying<>sitting EOB mod A. Pt required min A to scoot to EOB. Pt transferred stand<>pivot bed<>WC without AD mod A. Pt reported urge to use restroom and transferred stand<>pivot WC<>toilet max A with use of grab bars. Pt stated he felt like he needed to urinate however, unsuccessful despite extensive time. Pt required min A for seated balance on toilet and max verbal cues to correct L lateral lean. Pt donned incontinence brief and pants in standing with total assist +2. Therapist donned socks, shoes, and L AFO total assist. Pt transported to gym in [facility] for time management purposes. Pt transferred stand<>pivot WC<>mat  without AD mod A x 2 trials throughout session. Pt transferred sit<>stand with RW mod/min A with strong L lateral lean. Worked on midline orientation in standing using RW x 3 trials for approximately 2-3 minutes each with tactile cues and manual facilitation of L knee extension and to shift weight to R all mod/max A. Pt with increased fatigue with eyes closing and muscles shaking with increased activity. Pt required multiple rest breaks throughout session. Pt also required verbal cues for standing technique including hand positioning on mat, leaning forward, and scooting to the edge of the mat. Concluded session with pt sitting in WC, needs within reach, and seatbelt alarm on. Therapist provided pt with drink.    Treatment Session 2: [id number] 45 min Received pt sitting in WC, pt agreeable to therapy, and denied any pain during session. Recreational therapy present during session. Session focused on functional mobility/transfers, LE strength, dynamic sitting/standing balance, NMR, and improved endurance with activity. NT present to assess vitals. Pt transported to gym in [facility] for time management purposes. Pt transferred WC<>mat stand<>pivot mod A with RW. Pt with strong L lateral lean requiring mod A to correct. While seated pt worked on dynamic core stability, reaching outside of BOS, and midline orientation by fishing. Pt used R UE to hold fishing pole and reached outside BOS to hook fish from floor onto pole and transfer them to a table on the R. Recreational therapy assisted with placing fish and managing pole while physical therapist sat on L and behind patient emphasizing trunk control, reaching, and  weight bearing through L UE min A/CGA. Pt transferred sit<>stand with RW mod A. Therapist provided tactile cues to weight shift to R and manual facilitation of L knee extension. However, pt with increased fatigue evidenced by closing eyes and stronger lean to L. Stand<>pivot max A back to WC. Pt with poor motor  planning requiring manual facilitation to move L LE. Pt requested to stay sitting up. Concluded session with pt sitting in WC, needs within reach, and seatbelt alarm on.   Therapy Documentation Precautions:  Precautions Precautions: Fall Precaution Comments: L hemiparesis Restrictions Weight Bearing Restrictions: No  Therapy/Group: Individual Therapy [name] [name] PT, DPT   09/19/2019, 7:33 AM

## 2019-09-20 ENCOUNTER — Inpatient Hospital Stay (HOSPITAL_COMMUNITY): Payer: Medicare Other

## 2019-09-20 ENCOUNTER — Inpatient Hospital Stay (HOSPITAL_COMMUNITY): Payer: Medicare Other | Admitting: Occupational Therapy

## 2019-09-20 ENCOUNTER — Encounter (HOSPITAL_COMMUNITY): Payer: Self-pay | Admitting: Physical Medicine & Rehabilitation

## 2019-09-20 ENCOUNTER — Ambulatory Visit (HOSPITAL_COMMUNITY): Payer: Medicare Other

## 2019-09-20 ENCOUNTER — Encounter (HOSPITAL_COMMUNITY): Payer: Medicare Other | Admitting: Occupational Therapy

## 2019-09-20 ENCOUNTER — Encounter (HOSPITAL_COMMUNITY): Payer: Medicare Other | Admitting: Speech Pathology

## 2019-09-20 DIAGNOSIS — C8308 Small cell B-cell lymphoma, lymph nodes of multiple sites: Secondary | ICD-10-CM | POA: Diagnosis not present

## 2019-09-20 LAB — BASIC METABOLIC PANEL
Anion gap: 10 (ref 5–15)
BUN: 10 mg/dL (ref 8–23)
CO2: 23 mmol/L (ref 22–32)
Calcium: 9.8 mg/dL (ref 8.9–10.3)
Chloride: 97 mmol/L — ABNORMAL LOW (ref 98–111)
Creatinine, Ser: 0.97 mg/dL (ref 0.61–1.24)
GFR calc Af Amer: >60 mL/min (ref >60)
GFR calc non Af Amer: >60 mL/min (ref >60)
Glucose, Bld: 109 mg/dL — ABNORMAL HIGH (ref 70–99)
Potassium: 3.6 mmol/L (ref 3.5–5.1)
Sodium: 130 mmol/L — ABNORMAL LOW (ref 135–145)

## 2019-09-20 LAB — CBC WITH DIFFERENTIAL/PLATELET
Abs Immature Granulocytes: 0.1 10*3/uL — ABNORMAL HIGH (ref 0.00–0.07)
Basophils Absolute: 0.1 10*3/uL (ref 0.0–0.1)
Basophils Relative: 1 %
Eosinophils Absolute: 0.2 10*3/uL (ref 0.0–0.5)
Eosinophils Relative: 5 %
HCT: 29.7 % — ABNORMAL LOW (ref 39.0–52.0)
Hemoglobin: 10 g/dL — ABNORMAL LOW (ref 13.0–17.0)
Immature Granulocytes: 2 %
Lymphocytes Relative: 7 %
Lymphs Abs: 0.4 10*3/uL — ABNORMAL LOW (ref 0.7–4.0)
MCH: 24.3 pg — ABNORMAL LOW (ref 26.0–34.0)
MCHC: 33.7 g/dL (ref 30.0–36.0)
MCV: 72.1 fL — ABNORMAL LOW (ref 80.0–100.0)
Monocytes Absolute: 0.9 10*3/uL (ref 0.1–1.0)
Monocytes Relative: 17 %
Neutro Abs: 3.5 10*3/uL (ref 1.7–7.7)
Neutrophils Relative %: 68 %
Platelets: 320 10*3/uL (ref 150–400)
RBC: 4.12 MIL/uL — ABNORMAL LOW (ref 4.22–5.81)
RDW: 16.3 % — ABNORMAL HIGH (ref 11.5–15.5)
WBC: 5.2 10*3/uL (ref 4.0–10.5)
nRBC: 0 % (ref 0.0–0.2)

## 2019-09-20 LAB — GLUCOSE, CAPILLARY: Glucose-Capillary: 108 mg/dL — ABNORMAL HIGH (ref 70–99)

## 2019-09-20 NOTE — Consult Note (Signed)
NAME:  [name] [name], MRN:  [id number], DOB:  1956-02-05, LOS: 42 ADMISSION DATE:  09/03/2019, CONSULTATION DATE:  09/20/2019 REFERRING [id number] Posey Pronto  , CHIEF COMPLAINT:  Needs  Bronch and BAL as inpatient  Brief History   [name] [name] is a 64 y.o. 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 He has had cough, fever, and opacity per  RML with inability to produce sputum specimen since admission. ID recommends bronch with BAL to rule out atypical infection prior to resuming chemo. PCCM have been asked to evaluate for inpatient bronch/ BAL and sputum evaluation.  History of present illness   [name] [name] is a 64 y.o. 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  He has had chills for a month and has had low grade fevers 100-101 F with leukopenia throughout current  admission.He has had 1 month history of night sweats and endorses  chills consistently since November/December. He also describes weight loss but uncertain as to how much (in the hospital 3 weeks now).  CXR/ CT Chest  shows right medial lower  lobe opacity, and right middle lobe inferior opacity. Pt. has previously had similar findings per left lung that self resolved. He has not had antibiotic treatment as there is oncern that his fevers/leucopenia are due to lymphoproliferative process The patient is unable to produce a sputum specimen. ID recommends that with cough history, infiltrate on imaging  and upcoming chemotherapy, that patient have a bronch with BAL to ensure no atypical infection prior to   resuming  chemo treatments.PCCM have been consulted for inpatient bronch and BAL and sputum evaluation.   Past Medical History   Past Medical History:  Diagnosis Date  . Allergic rhinitis, cause unspecified   . Backache, unspecified   . Body mass index 33.0-33.9, adult   . Diabetes mellitus without complication ([place])    Type II  . Elevated blood pressure reading without diagnosis of hypertension   . Esophageal reflux   . Generalized pain   . Heartburn   . Insomnia, unspecified   . 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 Other abnormal blood chemistry   . Other and unspecified hyperlipidemia   . Other dyspnea and respiratory abnormality   . Other nonspecific findings on examination of blood(790.99)   . Pain in joint, lower leg   . Pneumonia    1968  . Sleep apnea    Does not use or own a CPAP  . Tobacco use disorder   . Unspecified essential hypertension     Significant Hospital Events   09/03/2019>> Admission with   Consults:  09/16/2019>> ID 09/20/2019>>PCCM  Procedures:    Significant Diagnostic Tests:  09/10/2019 CXR Stable opacity in the medial aspect of the right lung base similar to that seen on prior exam.   08/28/2019 CT Chest>> Improving lymphadenopathy in the chest, abdomen, and pelvis. Dominant axillary nodes measure up to 10 mm, previously 2.2 cm. Dominant right common iliac node measures 1.8 cm, previously 4.0 cm.  Prior left upper lobe/suprahilar nodular opacity has resolved. New patchy/nodular opacities in the right  middle and lower lobes. While lymphomatous involvement is possible, the appearance favors Infection/inflammation.  Micro Data:  1/18 >>Blood: No Growth Final 1/18>> Urine: No Growth Final  Antimicrobials:  None   Interim history/subjective:  Pt. Is relatively asymptomatic, states he has a non-productive cough. States his breathing is fine  Objective   Blood pressure 125/75, pulse 71,  temperature 98.2 F (36.8 C), resp. rate 18, height _0  (1.702 m), weight 73.8 kg, SpO2 98 %.        Intake/Output Summary (Last 24 hours) at 09/20/2019 0848 Last data filed at 09/19/2019 2045 Gross per 24 hour  Intake 0 ml  Output 400 ml  Net -400 ml   Filed Weights   09/03/19 1511 09/04/19 0414  Weight: 77.8 kg 73.8 kg    Examination: General: Awake and alert male with L sided hemiparesis, supine in bed, NAD HENT: NCAT, slight left facial droop, No LAD, MM Pink and moist Lungs: Bilateral excursion, diminished per RML, and per right bases.  Cardiovascular: S1, S2, RRR, No RMG noted Abdomen: Soft, Non-tender, ND, BS +. Body mass index is 25.48 kg/m. 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  Unable to cough up sputum for micro to R/O atypical infection prior to resuming chemo  Plan Will discuss scheduling inpatient Bronch with BAL and Micro  ( sputum for routine culture, fungal and AFB ) with Dr. Loanne Drilling Consider adding mucinex 1200 mg BID with full glass of water as mucolytic Aggressive pulmonary toilet Flutter Valve and IS OOB as much as patient can tolerate      Best practice:  Diet: Per primary team Pain/Anxiety/Delirium protocol (if indicated): Per Primary Team VAP protocol (if indicated): NA DVT prophylaxis: Lovenox GI prophylaxis:Per Primary Team  Glucose control: Per Primary Team Mobility: OOB/ Rehab Therapy per Rehab Team Code Status: Full Family Communication: Communicated plan with patient Disposition: Rehab Unit  Labs   CBC: Recent Labs  Lab 09/16/19 0840 09/20/19 0549  WBC 2.9* 5.2   NEUTROABS 1.7 3.5  HGB 10.6* 10.0*  HCT 31.5* 29.7*  MCV 72.1* 72.1*  PLT 314 161    Basic Metabolic Panel: Recent Labs  Lab 09/16/19 0840 09/20/19 0549  NA 132* 130*  K 4.0 3.6  CL 95* 97*  CO2 26 23  GLUCOSE 178* 109*  BUN 13 10  CREATININE 1.09 0.97  CALCIUM 10.0 9.8   GFR: Estimated Creatinine Clearance: 72.9 mL/min (by C-G formula based on SCr of 0.97 mg/dL). Recent Labs  Lab 09/16/19 0840 09/20/19 0549  WBC 2.9* 5.2    Liver Function Tests: No results for input(s): AST, ALT, ALKPHOS, BILITOT, PROT, ALBUMIN in the last 168 hours. No results for input(s): LIPASE, AMYLASE in the last 168 hours. No results for input(s): AMMONIA in the last 168 hours.  ABG    Component Value Date/Time   TCO2 28 08/29/2019 2004     Coagulation Profile: No results for input(s): INR, PROTIME in the last 168 hours.  Cardiac Enzymes: No results for input(s): CKTOTAL, CKMB, CKMBINDEX, TROPONINI in the last 168 hours.  HbA1C: Hgb A1c MFr Bld  Date/Time Value Ref Range Status  08/29/2019  07:31 PM 5.9 (H) 4.8 - 5.6 % Final    Comment:    REPEATED TO VERIFY (NOTE) Pre diabetes:          5.7%-6.4% Diabetes:              >6.4% Glycemic control for   <7.0% adults with diabetes   05/16/2018 11:39 AM 6.2 (H) 4.8 - 5.6 % Final    Comment:    (NOTE) Pre diabetes:          5.7%-6.4% Diabetes:              >6.4% Glycemic control for   <7.0% adults with diabetes     CBG: Recent Labs  Lab 09/16/19 0558 09/17/19 0554 09/18/19 0617 09/19/19 0603 09/20/19 0627  GLUCAP 107* 123* 128* 115* 108*    Review of Systems:    Gen: + fever, chills, weight change, fatigue, night sweats HEENT: Denies blurred vision, double vision, hearing loss, tinnitus, sinus congestion, rhinorrhea, sore throat, neck stiffness, dysphagia PULM: Denies shortness of breath, + non-productive cough, No sputum production, hemoptysis, wheezing CV: Denies chest pain, edema, orthopnea, paroxysmal nocturnal  dyspnea, palpitations GI: Denies abdominal pain, nausea, vomiting, diarrhea, hematochezia, melena, constipation, change in bowel habits GU: Denies dysuria, hematuria, polyuria, oliguria, urethral discharge Endocrine: Denies hot or cold intolerance, polyuria, polyphagia or appetite change Derm: Denies rash, dry skin, scaling or peeling skin change Heme: Denies easy bruising, bleeding, bleeding gums Neuro: Denies headache, numbness, weakness, slurred speech, loss of memory or consciousness  Past Medical History  He,  has a past medical history of Allergic rhinitis, cause unspecified, Backache, unspecified, Body mass index 33.0-33.9, adult, Diabetes mellitus without complication ([facility]), Elevated blood pressure reading without diagnosis of hypertension, Esophageal reflux, Generalized pain, Heartburn, Insomnia, unspecified, Nonspecific reaction to tuberculin skin test without active tuberculosis(795.51), Other abnormal blood chemistry, Other and unspecified hyperlipidemia, Other dyspnea and respiratory abnormality, Other nonspecific findings on examination of blood(790.99), Pain in joint, lower leg, Pneumonia, Sleep apnea, Tobacco use disorder, and Unspecified essential hypertension.   Surgical History    Past Surgical History:  Procedure Laterality Date  . bil wrist surgery    . ENDOSCOPIC CONCHA BULLOSA RESECTION Bilateral 05/18/2018   Procedure: ENDOSCOPIC CONCHA BULLOSA RESECTION;  Surgeon: Jerrell Belfast, MD;  Location: [place];  Service: ENT;  Laterality: Bilateral;  . NASAL SEPTOPLASTY W/ TURBINOPLASTY Bilateral 05/18/2018   Procedure: NASAL SEPTOPLASTY WITH TURBINATE REDUCTION;  Surgeon: Jerrell Belfast, MD;  Location: [place];  Service: ENT;  Laterality: Bilateral;  . ORIF FOREARM FRACTURE     right  . plastic surgery to face    . SINUS ENDO W/FUSION Bilateral 05/18/2018   Procedure: ENDOSCOPIC SINUS SURGERY WITH NAVIGATION;  Surgeon: Jerrell Belfast, MD;  Location: [place];  Service: ENT;   Laterality: Bilateral;     Social History   reports that he quit smoking about 7 years ago. His smoking use included cigars. He has a 4.00 pack-year smoking history. He has never used smokeless tobacco. He reports current alcohol use. He reports that he does not use drugs.   Family History   His family history includes Cancer in his paternal uncle; Diabetes in his mother; Hypertension in his mother.   Allergies Allergies  Allergen Reactions  . Lisinopril Itching  . Tuberculin Tests Swelling     Home Medications  Prior to Admission medications   Medication Sig Start Date End Date Taking? Authorizing Provider  amLODipine (NORVASC) 10 MG tablet Take 10 mg by mouth daily.  [provider]  atorvastatin (LIPITOR) 20 MG tablet Take 20 mg by mouth daily.    [provider]  clopidogrel (PLAVIX) 75 MG tablet Take 1 tablet (75 mg total) by mouth daily. 09/04/19   Mikhail, Velta Addison, DO  cyclobenzaprine (FLEXERIL) 10 MG tablet Take 10 mg by mouth at bedtime.    [provider]  losartan (COZAAR) 50 MG tablet Take 50 mg by mouth daily.    [provider]  oxyCODONE-acetaminophen (PERCOCET/ROXICET) 5-325 MG tablet Take 1 tablet by mouth 3 (three) times daily as needed for pain. 04/21/18   [provider]  pantoprazole (PROTONIX) 40 MG tablet Take 40 mg by mouth at bedtime.     [provider]  potassium chloride SA (K-DUR,KLOR-CON) 20 MEQ tablet Take 20 mEq by mouth daily.    [provider]  zolpidem (AMBIEN) 10 MG tablet Take 10 mg by mouth at bedtime.     [provider]     Pulmonary Service Time : 110 minutes    Magdalen Spatz, MSN, AGACNP-BC [place] Pager # [phone] After 4 pm please call [phone] 09/20/2019 9:33 AM

## 2019-09-20 NOTE — Plan of Care (Signed)
  Problem: Consults Goal: RH STROKE PATIENT EDUCATION Description: See Patient Education module for education specifics  Outcome: Progressing   Problem: RH BOWEL ELIMINATION Goal: RH STG MANAGE BOWEL WITH ASSISTANCE Description: STG Manage Bowel with Mod I Assistance. Outcome: Progressing   Problem: RH SAFETY Goal: RH STG ADHERE TO SAFETY PRECAUTIONS W/ASSISTANCE/DEVICE Description: STG Adhere to Safety Precautions With Min Assistance/Device. Outcome: Progressing   Problem: RH COGNITION-NURSING Goal: RH STG ANTICIPATES NEEDS/CALLS FOR ASSIST W/ASSIST/CUES Description: STG Anticipates Needs/Calls for Assist With Min Assistance/Cues. Outcome: Progressing   Problem: RH BLADDER ELIMINATION Goal: RH STG MANAGE BLADDER WITH ASSISTANCE Description: STG Manage Bladder With Mod I Assistance Outcome: Not Progressing

## 2019-09-20 NOTE — Progress Notes (Signed)
Occupational Therapy Session Note  Patient Details  Name: [name] [name] MRN: [id number] Date of Birth: Oct 01, 1955  Today's Date: 09/20/2019 OT Individual Time: 1300-1343 OT Individual Time Calculation (min): 43 min    Short Term Goals: Week 3:  OT Short Term Goal 1 (Week 3): Pt will recall hemi dressing techniques with no VC to don shirt with mod assist OT Short Term Goal 2 (Week 3): Pt will manage LUE prior to transfers to demo improved L attention OT Short Term Goal 3 (Week 3): Pt will complete toilet transfer with Mod assist OT Short Term Goal 4 (Week 3): Pt will complete LB dressing with mod assist  Skilled Therapeutic Interventions/Progress Updates:    Treatment session with focus on functional transfers, standing balance, and awareness of LUE during mobility and self-care tasks.  Pt received upright in w/c agreeable to therapy session.  Therapist donned TEDS and shoes with Lt AFO total assist.  Pt reports need to toilet.  Completed stand pivot transfer to Lt with Mod assist and multimodal cues to advance LLE.  Pt continent of bladder on toilet, reports need to have BM but unsuccessful despite increased time on toilet.  Completed transfer back to w/c mod assist.  PROM to LUE, noted increased tone in elbow.  Positioned upright in w/c with seat belt alarm on and all needs in reach.  [name] was scheduled for family education, however was not present during session.  Social Financial controller notified.  Therapy Documentation Precautions:  Precautions Precautions: Fall Precaution Comments: L hemiparesis Restrictions Weight Bearing Restrictions: No General:   Vital Signs: Therapy Vitals Temp: 98.9 F (37.2 C) Temp Source: Oral Pulse Rate: 75 Resp: 18 BP: 125/77 Patient Position (if appropriate): Sitting Oxygen Therapy SpO2: 98 % O2 Device: Room Air Pain:  Pt with no c/o pain   Therapy/Group: Individual Therapy  Simonne Come 09/20/2019, 1:44 PM

## 2019-09-20 NOTE — Progress Notes (Signed)
Occupational Therapy Session Note  Patient Details  Name: [name] [name] MRN: [id number] Date of Birth: Apr 25, 1956  Today's Date: 09/20/2019 OT Individual Time: [id number] OT Individual Time Calculation (min): 60 min    Short Term Goals: Week 3:  OT Short Term Goal 1 (Week 3): Pt will recall hemi dressing techniques with no VC to don shirt with mod assist OT Short Term Goal 2 (Week 3): Pt will manage LUE prior to transfers to demo improved L attention OT Short Term Goal 3 (Week 3): Pt will complete toilet transfer with Mod assist OT Short Term Goal 4 (Week 3): Pt will complete LB dressing with mod assist  Skilled Therapeutic Interventions/Progress Updates:    Treatment session with focus on arousal, activity tolerance, and sitting balance during self-care tasks. Pt received supine in bed requiring increased time to fully arouse.  Pt incontinent of urine and requesting to shower.  Completed bed mobility with mod assist and sit > stand in to [place] with Mod assist.  Engaged in bathing in room shower from tub bench for increased support in sitting, therapist positioned in front of pt to facilitate sitting balance, as pt continues to demonstrate extreme Lt lean in sitting with inability to correct.  Mod assist sit > stand in shower with +2 for safety for standing while therapist washed buttocks.  Utilized [place] again to exit shower.  Engaged in dressing at sit > stand level at sink.  Pt able to recall hemi-dressing technique, however continues to require total assist for LB dressing and mod assist for UB dressing.  Multimodal cues for weight shifting in standing to facilitate midline standing, utilizing mirror for feedback.  However pt reporting increased double vision when looking up but refusing to use eye patch.  Educated on purpose of eye patch, will continue to require education. Pt remained upright in w/c with seat belt alarm on and all needs in reach.  Therapy Documentation Precautions:   Precautions Precautions: Fall Precaution Comments: L hemiparesis Restrictions Weight Bearing Restrictions: No Pain: Pain Assessment Pain Scale: 0-10 Pain Score: 0-No pain  Therapy/Group: Individual Therapy  Simonne Come 09/20/2019, 12:12 PM

## 2019-09-20 NOTE — Progress Notes (Signed)
[place] PHYSICAL MEDICINE & REHABILITATION PROGRESS NOTE  Subjective/Complaints: Patient seen laying in bed this AM.  He states he slept well overnight. States he did not see Pulm yesterday.   ROS: Denies cough, CP, SOB, N/V/D  Objective: Vital Signs: Blood pressure 125/75, pulse 71, temperature 98.2 F (36.8 C), resp. rate 18, height _0  (1.702 m), weight 73.8 kg, SpO2 98 %. No results found. Recent Labs    09/20/19 0549  WBC 5.2  HGB 10.0*  HCT 29.7*  PLT 320   Recent Labs    09/20/19 0549  NA 130*  K 3.6  CL 97*  CO2 23  GLUCOSE 109*  BUN 10  CREATININE 0.97  CALCIUM 9.8    Physical Exam: BP 125/75 (BP Location: Right Arm)   Pulse 71   Temp 98.2 F (36.8 C)   Resp 18   Ht _1  (1.702 m)   Wt 73.8 kg   SpO2 98%   BMI 25.48 kg/m   Constitutional: No distress . Vital signs reviewed. HENT: Normocephalic.  Atraumatic. Eyes: EOMI. No discharge. Cardiovascular: No JVD. Respiratory: Normal effort.  No stridor. GI: Non-distended. Skin: Warm and dry.  Intact. Psych: Normal mood.  Normal behavior. Musc: No edema in extremities.  No tenderness in extremities. Neuro: Alert Motor:  LUE: shoulder abduction 1+/5, 0/5 distally, stable LLE: Hip flexion, knee extension 1+/5, ankle dorsiflexion 0/5, stable No tone increase  Assessment/Plan: 1. Functional deficits secondary to right paramedian pontine infarct which require 3+ hours per day of interdisciplinary therapy in a comprehensive inpatient rehab setting.  Physiatrist is providing close team supervision and 24 hour management of active medical problems listed below.  Physiatrist and rehab team continue to assess barriers to discharge/monitor patient progress toward functional and medical goals  Care Tool:  Bathing    Body parts bathed by patient: Chest, Abdomen, Face   Body parts bathed by helper: Right arm, Left arm, Chest, Abdomen, Front perineal area, Buttocks     Bathing assist Assist Level:  Moderate Assistance - Patient 50 - 74%     Upper Body Dressing/Undressing Upper body dressing   What is the patient wearing?: Pull over shirt    Upper body assist Assist Level: Maximal Assistance - Patient 25 - 49%    Lower Body Dressing/Undressing Lower body dressing      What is the patient wearing?: Incontinence brief, Orthosis, Pants     Lower body assist Assist for lower body dressing: Total Assistance - Patient < 25%     Toileting Toileting Toileting Activity did not occur Landscape architect and hygiene only): N/A (no void or bm)  Toileting assist Assist for toileting: 2 Helpers Assistive Device Comment: urinal   Transfers Chair/bed transfer  Transfers assist  Chair/bed transfer activity did not occur: Safety/medical concerns  Chair/bed transfer assist level: Maximal Assistance - Patient 25 - 49%     Locomotion Ambulation   Ambulation assist      Assist level: Moderate Assistance - Patient 50 - 74% Assistive device: Walker-rolling Max distance: 76f   Walk 10 feet activity   Assist  Walk 10 feet activity did not occur: Safety/medical concerns(decreased strength/activity tolerance)  Assist level: Moderate Assistance - Patient - 50 - 74% Assistive device: Walker-rolling, Orthosis   Walk 50 feet activity   Assist Walk 50 feet with 2 turns activity did not occur: Safety/medical concerns(decreased strength/activity tolerance)  Assist level: Moderate Assistance - Patient - 50 - 74% Assistive device: Walker-rolling, Orthosis    Walk 150  feet activity   Assist Walk 150 feet activity did not occur: Safety/medical concerns(decreased strength/activity tolerance)         Walk 10 feet on uneven surface  activity   Assist Walk 10 feet on uneven surfaces activity did not occur: Safety/medical concerns(decreased strength/activity tolerance)         Wheelchair     Assist Will patient use wheelchair at discharge?: Yes Type of Wheelchair:  Manual Wheelchair activity did not occur: Safety/medical concerns(Unable without skilled intervention, L hemi)  Wheelchair assist level: Supervision/Verbal cueing Max wheelchair distance: 114f    Wheelchair 50 feet with 2 turns activity    Assist    Wheelchair 50 feet with 2 turns activity did not occur: (Unable without skilled intervention, L hemi)   Assist Level: Supervision/Verbal cueing   Wheelchair 150 feet activity     Assist Wheelchair 150 feet activity did not occur: (Unable without skilled intervention, L hemi)   Assist Level: Supervision/Verbal cueing      Medical Problem List and Plan: 1.  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  Antithrombotics: -DVT/anticoagulation: Lovenox             -antiplatelet therapy: Plavix 75 mg daily patient placed in [employer] XI inhibitor stroke prevention trial 3. Pain Management: Tylenol as needed  Voltaren gel changed to muscle rub right shoulder after discussion with neurology 4. Mood: Provide emotional support             -antipsychotic agents: N/A 5. Neuropsych: This patient is capable of making decisions on his own behalf. 6. Skin/Wound Care: Routine skin checks 7. Fluids/Electrolytes/Nutrition: Routine in and outs 8.  Chronic hyponatremia.  Low sodium in September through November 2020.    IVFs 100cc/hr d/ced  Na 130 on 1/29, labs ordered for Monday 9.  CLL/small B-cell lymphoma.  Follow-up Dr.Kale.  Patient last received chemotherapy October 2020. 10.  Type 2 diabetes mellitus with hyperglycemia.  Hemoglobin A1c 5.9.  Glucophage currently held.  Continue sliding scale  Relatively controlled on 1/29  Monitor with increased mobility 11.  Hyperlipidemia.  Lipitor 12.  History of hypertension.    On no medications at present  Controlled on 1/29  Monitor with increased mobility 13. Mild  Spasticity:  Cont to monitor  Controlled at present without medication-continue range of motion 14. OSA: refuses to wear CPAP 15. Hypoalbuminemia  Supplement initiated on 1/13 16. Leukopenia  WBCs 5.2 on 1/29  See #9  Cont to monitor 17. Acute on chronic anemia  Hb 10.0 on 1/29 18. Fever:   Persistent intermittent low-grade/borderline fevers  UA negative, urine culture negative  Blood cultures no growth FINAL  Chest x-ray on 1/19 reviewed, showing stable opacity, prolonged discussion with Heme/Onc regarding lymphoma and opacity-suggesting central origin.  Appreciate ID recs, recommending BAL.  Discussed with Pulm, to be evaluated today 19.  Sleep disturbance  Ambien 10 d/ced due to lethargy  Overall improved  LOS: 17 days A FACE TO FACE EVALUATION WAS PERFORMED  Livana Yerian ALorie Phenix1/29/2021, 8:42 AM

## 2019-09-20 NOTE — Progress Notes (Signed)
Physical Therapy Session Note  Patient Details  Name: [name] [name] MRN: [id number] Date of Birth: 10/22/1955  Today's Date: 09/20/2019 PT Individual Time: 0900-0930 and 1430-1501 PT Individual Time Calculation (min): 30 min and 31 min  Short Term Goals: Week 2:  PT Short Term Goal 1 (Week 2): Pt will perform bed<>chair transfer with LRAD CGA PT Short Term Goal 1 - Progress (Week 2): Progressing toward goal PT Short Term Goal 2 (Week 2): Pt will ambulate 74ft with LRAD CGA PT Short Term Goal 2 - Progress (Week 2): Progressing toward goal PT Short Term Goal 3 (Week 2): Pt will perform car transfer with LRAD CGA PT Short Term Goal 3 - Progress (Week 2): Progressing toward goal Week 3:  PT Short Term Goal 1 (Week 3): Pt will perform bed<>chair transfer with LRAD CGA PT Short Term Goal 2 (Week 3): Pt will ambulate 45ft with LRAD CGA PT Short Term Goal 3 (Week 3): Pt will perform car transfer with LRAD CGA  Skilled Therapeutic Interventions/Progress Updates:   Treatment Session 1: 0900-0930 30 min Received pt supine in bed finishing breakfast, pt agreeable to therapy, and denied any pain during session. Pt appeared more awake and talkative this morning but did demonstrate slight confusion telling therapist " they took me out of the room in my bed last night while I was sleeping". Therapist assisted pt with finishing breakfast using L UE and hand over hand assist to hold spoon and bring grits to mouth mod A. Pt required increased time and effort to grasp spoon in hands. Pt working hard to use L UE to eat as evidenced by muscle shaking with activation. Pt reported urge to urinate and requested urinal. Therapist provided total assist with urination. Pt's blankets on bed wet and cold, therapist provided pt with clean blankets and repositioned pt in bed for comfort. Concluded session with pt supine in bed, needs within reach, and bed alarm on.     Treatment Session 2: 1430-1501 31 min Received pt  sitting in WC, pt agreeable to therapy, and denied any pain during session. Pt extremely lethargic during today's session with eyes closed for majority of session requiring max verbal cues to remain awake. When asked if patient was tired pt nodded yes, however when therapist asked if pt wanted to return to bed, pt stated no. Pt transported to gym in [facility] total assist. Pt shivering and stated that he felt cold during session. Therapist attempted to don jacket in sitting however, pt unable to initiate to assist therapist. Pt transferred sit<>stand mod A with strong L lateral lean and required +2 assist to don jacket. Pt with eyes closed upon standing and unable to shift weight to R despite max vebal cueing. Pt began shaking and returned to sitting due to increased fatigue. Attempted second sit<>stand however pt unable to initiate scooting hips forward and proceeded to fall asleep in chair. Pt was transported back to room total assist. Pt requested to stay sitting up in chair despite fatigue even when therapist suggested returning to bed. Concluded session with pt sitting in WC, needs within reach, and seatbelt alarm on.   Therapy Documentation Precautions:  Precautions Precautions: Fall Precaution Comments: L hemiparesis Restrictions Weight Bearing Restrictions: No   Therapy/Group: Individual Therapy Alfonse Alpers PT, DPT   09/20/2019, 7:42 AM

## 2019-09-20 NOTE — Progress Notes (Signed)
Speech Language Pathology Daily Session Note  Patient Details  Name: [name] [name] MRN: [id number] Date of Birth: May 24, 1956  Today's Date: 09/20/2019 SLP Individual Time: [id number] SLP Individual Time Calculation (min): 30 min  Short Term Goals: Week 3: SLP Short Term Goal 1 (Week 3): STG=LTG due to short ELOS  Skilled Therapeutic Interventions:  Skilled treatment session focused on cognition goals. SLP facilitated session by providing Maximal assistance to sort money d/t deficits in sustained attention.   Pt continues to have change in medical status and RLL opacity. Additionally there is a MD note on 09/17/18 that reads "?post stroke dysphagia." Per chart review, pt has not have any dysphagia assessments. During this session the [place] was provided and pt passed. Secure chat sent to MD on the above information and currently pt is scheduled for [facility]. If no information is gained from bronc then MBS might be indicated to rule out silent aspiration. ST to follow.       Pain    Therapy/Group: Individual Therapy  Jerilyn Gillaspie 09/20/2019, 3:42 PM

## 2019-09-21 ENCOUNTER — Inpatient Hospital Stay (HOSPITAL_COMMUNITY): Payer: Medicare Other

## 2019-09-21 ENCOUNTER — Inpatient Hospital Stay (HOSPITAL_COMMUNITY): Payer: Medicare Other | Admitting: Speech Pathology

## 2019-09-21 LAB — GLUCOSE, CAPILLARY: Glucose-Capillary: 106 mg/dL — ABNORMAL HIGH (ref 70–99)

## 2019-09-21 MED ORDER — METHYLPHENIDATE HCL 5 MG PO TABS
5.0000 mg | ORAL_TABLET | Freq: Every day | ORAL | Status: DC
  Administered 2019-09-21 – 2019-09-28 (×7): 5 mg via ORAL
  Filled 2019-09-21 (×7): qty 1

## 2019-09-21 NOTE — Plan of Care (Signed)
  Problem: Consults Goal: RH STROKE PATIENT EDUCATION Description: See Patient Education module for education specifics  Outcome: Progressing   Problem: RH BOWEL ELIMINATION Goal: RH STG MANAGE BOWEL WITH ASSISTANCE Description: STG Manage Bowel with Mod I Assistance. Outcome: Progressing   Problem: RH BLADDER ELIMINATION Goal: RH STG MANAGE BLADDER WITH ASSISTANCE Description: STG Manage Bladder With Mod I Assistance Outcome: Progressing   Problem: RH SAFETY Goal: RH STG ADHERE TO SAFETY PRECAUTIONS W/ASSISTANCE/DEVICE Description: STG Adhere to Safety Precautions With Min Assistance/Device. Outcome: Progressing   Problem: RH COGNITION-NURSING Goal: RH STG ANTICIPATES NEEDS/CALLS FOR ASSIST W/ASSIST/CUES Description: STG Anticipates Needs/Calls for Assist With Min Assistance/Cues. Outcome: Progressing

## 2019-09-21 NOTE — Progress Notes (Signed)
Pts last documented BM was 09/18/19. Offered Sorbitol 70% x3. Pt refused. Pt stated, "I had a Bm yesterday". Will continue to monitor. Amanda Cockayne, LPN

## 2019-09-21 NOTE — Progress Notes (Signed)
Speech Language Pathology Daily Session Note  Patient Details  Name: [name] [name] MRN: [id number] Date of Birth: 03-25-1956  Today's Date: 09/21/2019 SLP Individual Time: 1200-1230 SLP Individual Time Calculation (min): 30 min  Short Term Goals: Week 3: SLP Short Term Goal 1 (Week 3): STG=LTG due to short ELOS  Skilled Therapeutic Interventions:  Skilled treatment session targeted cognition goals. Pt's lunch tray present and SLP attempted to arouse pt enough to consume meal. Despite multi-modal efforts, pt not able to demonstrate enough sustained attention d/t lethargy to consume lunch tray. Pt missed 15 minutes of skilled ST.      Pain    Therapy/Group: Individual Therapy  Jun Rightmyer 09/21/2019, 3:05 PM

## 2019-09-21 NOTE — Progress Notes (Signed)
[place] PHYSICAL MEDICINE & REHABILITATION PROGRESS NOTE  Subjective/Complaints: Patient sleeping in bed this morning Opens eyes to verbal stimulation, but falls back asleep immediately.   ROS: Denies cough, CP, SOB, N/V/D  Objective: Vital Signs: Blood pressure 130/75, pulse 70, temperature 99.6 F (37.6 C), resp. rate 18, height _0  (1.702 m), weight 73.8 kg, SpO2 96 %. No results found. Recent Labs    09/20/19 0549  WBC 5.2  HGB 10.0*  HCT 29.7*  PLT 320   Recent Labs    09/20/19 0549  NA 130*  K 3.6  CL 97*  CO2 23  GLUCOSE 109*  BUN 10  CREATININE 0.97  CALCIUM 9.8    Physical Exam: BP 130/75 (BP Location: Right Arm)   Pulse 70   Temp 99.6 F (37.6 C)   Resp 18   Ht _1  (1.702 m)   Wt 73.8 kg   SpO2 96%   BMI 25.48 kg/m   Constitutional: No distress . Vital signs reviewed. HENT: Normocephalic.  Atraumatic. Eyes: EOMI. No discharge. Cardiovascular: No JVD. Respiratory: Normal effort.  No stridor. GI: Non-distended. Skin: Warm and dry.  Intact. Psych: Lethargic.  Musc: No edema in extremities.  No tenderness in extremities. Neuro: Alert Motor:  LUE: shoulder abduction 1+/5, 0/5 distally, stable LLE: Hip flexion, knee extension 1+/5, ankle dorsiflexion 0/5, stable No tone increase  Assessment/Plan: 1. Functional deficits secondary to right paramedian pontine infarct which require 3+ hours per day of interdisciplinary therapy in a comprehensive inpatient rehab setting.  Physiatrist is providing close team supervision and 24 hour management of active medical problems listed below.  Physiatrist and rehab team continue to assess barriers to discharge/monitor patient progress toward functional and medical goals  Care Tool:  Bathing    Body parts bathed by patient: Chest, Abdomen, Face, Front perineal area, Right upper leg, Left upper leg   Body parts bathed by helper: Buttocks, Right arm, Left arm, Right lower leg, Left lower leg      Bathing assist Assist Level: Moderate Assistance - Patient 50 - 74%     Upper Body Dressing/Undressing Upper body dressing   What is the patient wearing?: Pull over shirt    Upper body assist Assist Level: Moderate Assistance - Patient 50 - 74%    Lower Body Dressing/Undressing Lower body dressing      What is the patient wearing?: Incontinence brief, Pants     Lower body assist Assist for lower body dressing: Total Assistance - Patient < 25%     Toileting Toileting Toileting Activity did not occur (Clothing management and hygiene only): N/A (no void or bm)  Toileting assist Assist for toileting: Total Assistance - Patient < 25% Assistive Device Comment: urinal   Transfers Chair/bed transfer  Transfers assist  Chair/bed transfer activity did not occur: Safety/medical concerns  Chair/bed transfer assist level: Dependent - mechanical lift(Stedy)     Locomotion Ambulation   Ambulation assist      Assist level: Moderate Assistance - Patient 50 - 74% Assistive device: Walker-rolling Max distance: 61f   Walk 10 feet activity   Assist  Walk 10 feet activity did not occur: Safety/medical concerns(decreased strength/activity tolerance)  Assist level: Moderate Assistance - Patient - 50 - 74% Assistive device: Walker-rolling, Orthosis   Walk 50 feet activity   Assist Walk 50 feet with 2 turns activity did not occur: Safety/medical concerns(decreased strength/activity tolerance)  Assist level: Moderate Assistance - Patient - 50 - 74% Assistive device: Walker-rolling, Orthosis  Walk 150 feet activity   Assist Walk 150 feet activity did not occur: Safety/medical concerns(decreased strength/activity tolerance)         Walk 10 feet on uneven surface  activity   Assist Walk 10 feet on uneven surfaces activity did not occur: Safety/medical concerns(decreased strength/activity tolerance)         Wheelchair     Assist Will patient use  wheelchair at discharge?: Yes Type of Wheelchair: Manual Wheelchair activity did not occur: Safety/medical concerns(Unable without skilled intervention, L hemi)  Wheelchair assist level: Supervision/Verbal cueing Max wheelchair distance: 172f    Wheelchair 50 feet with 2 turns activity    Assist    Wheelchair 50 feet with 2 turns activity did not occur: (Unable without skilled intervention, L hemi)   Assist Level: Supervision/Verbal cueing   Wheelchair 150 feet activity     Assist Wheelchair 150 feet activity did not occur: (Unable without skilled intervention, L hemi)   Assist Level: Supervision/Verbal cueing      Medical Problem List and Plan: 1.  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 Sessions limited by lethargy. Medications reviewed for sedating agents and none received. Bps have been stable. Will initiate Ritalin 520mdaily to stimulate patient and encourage increased participation in therapy.  2.  Antithrombotics: -DVT/anticoagulation: Lovenox             -antiplatelet therapy: Plavix 75 mg daily patient placed in [employer] inhibitor stroke prevention trial 3. Pain Management: Tylenol as needed  Voltaren gel changed to muscle rub right shoulder after discussion with neurology 4. Mood: Provide emotional support             -antipsychotic agents: N/A 5. Neuropsych: This patient is capable of making decisions on his own behalf. 6. Skin/Wound Care: Routine skin checks 7. Fluids/Electrolytes/Nutrition: Routine in and outs 8.  Chronic hyponatremia.  Low sodium in September through November 2020.    IVFs 100cc/hr d/ced  Na 130 on 1/29, labs ordered for Monday 9.  CLL/small B-cell lymphoma.  Follow-up Dr.Kale.  Patient last received chemotherapy October 2020. 10.  Type 2 diabetes mellitus with hyperglycemia.   Hemoglobin A1c 5.9.  Glucophage currently held.  Continue sliding scale  Relatively controlled on 1/29, 1/30.  Monitor with increased mobility 11.  Hyperlipidemia.  Lipitor 12.  History of hypertension.    On no medications at present  Controlled on 1/29, 1/30  Monitor with increased mobility 13. Mild Spasticity:  Cont to monitor  Controlled at present without medication-continue range of motion 14. OSA: refuses to wear CPAP 15. Hypoalbuminemia  Supplement initiated on 1/13 16. Leukopenia  WBCs 5.2 on 1/29  See #9  Cont to monitor 17. Acute on chronic anemia  Hb 10.0 on 1/29 18. Fever:   Persistent intermittent low-grade/borderline fevers  UA negative, urine culture negative  Blood cultures no growth FINAL  Chest x-ray on 1/19 reviewed, showing stable opacity, prolonged discussion with Heme/Onc regarding lymphoma and opacity-suggesting central origin.  Appreciate ID recs, recommending BAL.  Discussed with Pulm, to be evaluated today 19.  Sleep disturbance  Ambien 10 d/ced due to lethargy  Overall improved  LOS: 18 days A FACE TO FACE EVALUATION WAS PERFORMED  [name] [name] [name] [name] 09/21/2019, 3:09 PM

## 2019-09-21 NOTE — Progress Notes (Signed)
Physical Therapy Session Note  Patient Details  Name: [name] [name] MRN: [id number] Date of Birth: [birth year]  Today's Date: 09/21/2019 PT Individual Time: 1345-1440 PT Individual Time Calculation (min): 55 min   Short Term Goals: Week 2:  PT Short Term Goal 1 (Week 2): Pt will perform bed<>chair transfer with LRAD CGA PT Short Term Goal 1 - Progress (Week 2): Progressing toward goal PT Short Term Goal 2 (Week 2): Pt will ambulate 31ft with LRAD CGA PT Short Term Goal 2 - Progress (Week 2): Progressing toward goal PT Short Term Goal 3 (Week 2): Pt will perform car transfer with LRAD CGA PT Short Term Goal 3 - Progress (Week 2): Progressing toward goal Week 3:  PT Short Term Goal 1 (Week 3): Pt will perform bed<>chair transfer with LRAD CGA PT Short Term Goal 2 (Week 3): Pt will ambulate 40ft with LRAD CGA PT Short Term Goal 3 (Week 3): Pt will perform car transfer with LRAD CGA  Skilled Therapeutic Interventions/Progress Updates:   Received pt supine in bed, pt agreeable to therapy, and denied any pain during session. Session focused on functional mobility/transfers, eating, LE strength, dynamic standing balance, and improved endurance with activity. Pt initially lethargic upon PT arrival, however able to be verbally aroused. Pt had not eaten lunch and therapist suggested sitting EOB to eat. Pt performed bed mobility mod A x 2 trials throughout session with HOB elevated and use of bedrails. Pt with strong L lateral lean requiring mod A to correct. While sitting EOB pt used RUE to eat part of lunch including chicken and dumpling, peas, fruit cup, and milk. Pt required max verbal cues to correct L lateral trunk lean. Therapist donned non-skid socks and pt transferred sit<>stand x 1 with RW mod A. While standing pt's brief fell down and noted it was soiled. Pt transferred stand<>sit and sit<>supine mod A and donned clean incontinence brief mod A. Pt's Fiance called during session and therapist spoke  to her regarding pt's CLOF, ambulation status, and expected discharge date. Fiance expressed concern that pt's son works from [place] to [employer] and will be sleeping during the day when [name] will be needing help. Will follow up with social work on Monday. Concluded session with pt supine in bed, needs within reach, and bed alarm on.   Therapy Documentation Precautions:  Precautions Precautions: Fall Precaution Comments: L hemiparesis Restrictions Weight Bearing Restrictions: No   Therapy/Group: Individual Therapy Alfonse Alpers PT, DPT   09/21/2019, 7:48 AM

## 2019-09-22 ENCOUNTER — Inpatient Hospital Stay (HOSPITAL_COMMUNITY): Payer: Medicare Other

## 2019-09-22 LAB — GLUCOSE, CAPILLARY: Glucose-Capillary: 114 mg/dL — ABNORMAL HIGH (ref 70–99)

## 2019-09-22 NOTE — Progress Notes (Signed)
Physical Therapy Session Note  Patient Details  Name: [name] [name] MRN: [id number] Date of Birth: March 15, 1956  Today's Date: 09/22/2019 PT Individual Time: 1000-1100 PT Individual Time Calculation (min): 60 min   Short Term Goals: Week 1:  PT Short Term Goal 1 (Week 1): Patient will perform bed mobility with min A. PT Short Term Goal 1 - Progress (Week 1): Met PT Short Term Goal 2 (Week 1): Patient will perform basic transfers with min A using LRAD. PT Short Term Goal 2 - Progress (Week 1): Met PT Short Term Goal 3 (Week 1): Patient will ambulate >20 feet using LRAD. PT Short Term Goal 3 - Progress (Week 1): Met PT Short Term Goal 4 (Week 1): Patient will propel w/c >50 feet using R hemi technique. PT Short Term Goal 4 - Progress (Week 1): Met Week 2:  PT Short Term Goal 1 (Week 2): Pt will perform bed<>chair transfer with LRAD CGA PT Short Term Goal 1 - Progress (Week 2): Progressing toward goal PT Short Term Goal 2 (Week 2): Pt will ambulate 16f with LRAD CGA PT Short Term Goal 2 - Progress (Week 2): Progressing toward goal PT Short Term Goal 3 (Week 2): Pt will perform car transfer with LRAD CGA PT Short Term Goal 3 - Progress (Week 2): Progressing toward goal Week 3:  PT Short Term Goal 1 (Week 3): Pt will perform bed<>chair transfer with LRAD CGA PT Short Term Goal 2 (Week 3): Pt will ambulate 535fwith LRAD CGA PT Short Term Goal 3 (Week 3): Pt will perform car transfer with LRAD CGA  Skilled Therapeutic Interventions/Progress Updates:     PAIN denies pain   Pt initially supine and agreeable to treatment session with focus on bathing, toiliting, dressing, midline orientation.  Pt supine to R side to sit w/mod assist and max verbal cues to attend to L exts.  Pt able to sit w/cga using R rail.  Pt requesting to use BR.  STS in [place]/min assist and cues for wt shift to R, leans to L and for attention to position of L limbs.  Transported to commode via Steady, frequent cues to  return to midline due to L tendency in transit.  Semisit to stand w/cues for midline, dependent for removing brief, stand to sit to commode w/max cues for transition, management of LUE.   Pt w/BM and able to partially perform hygiene w/set up/provided toilet paper while seated, additional time.  Sit to stand in Steady and therapist completed hygiene/bathing of rectal area, cues to correct/prevent L lean, asist to stabilize LUE. Pt transported via steady to wc, stand to sit w/cues and min assist for positioning of hips, total assist to manage LUE. Pt requested to bathe prior to dressing.  Bathing performed at sink w/set up assist for face, chest, groin, upper legs and max assist for bathing L hand, RUE and bilat lower legs.  Additional time needed for bathing.   Clean brief donned by therapist in sitting/raised in standing in steady as above.  In sitting pt able to lift feet for therapist to thread thru pant legs and raise to thighs.  Donns clean shirt sitting in wc w/mod assist overall.   Stood in steady as above and pants raised by therapist, cues for midline. Pt requesting breakfast be reheated and nursing notified by this therapist.  Pt left oob in wc w/alarm belt set and needs in reach   Therapy Documentation Precautions:  Precautions Precautions: Fall Precaution Comments: L  hemiparesis Restrictions Weight Bearing Restrictions: No    Therapy/Group: Individual Therapy  Callie Fielding, [place] 09/22/2019, 12:52 PM

## 2019-09-22 NOTE — Progress Notes (Signed)
[name] [name] PHYSICAL MEDICINE & REHABILITATION PROGRESS NOTE  Subjective/Complaints: Patient sitting up in bed eating breakfast.  Denies pain. Says he needs to have a BM now. Sleeping well at night.   ROS: Denies cough, CP, SOB, N/V/D  Objective: Vital Signs: Blood pressure 115/74, pulse 73, temperature 98.8 F (37.1 C), resp. rate 17, height _0  (1.702 m), weight 73.8 kg, SpO2 97 %. No results found. Recent Labs    09/20/19 0549  WBC 5.2  HGB 10.0*  HCT 29.7*  PLT 320   Recent Labs    09/20/19 0549  NA 130*  K 3.6  CL 97*  CO2 23  GLUCOSE 109*  BUN 10  CREATININE 0.97  CALCIUM 9.8    Physical Exam: BP 115/74   Pulse 73   Temp 98.8 F (37.1 C)   Resp 17   Ht _1  (1.702 m)   Wt 73.8 kg   SpO2 97%   BMI 25.48 kg/m   Constitutional: No distress . Vital signs reviewed. Sitting up in bed eating breakfast.  HENT: Normocephalic.  Atraumatic. Eyes: EOMI. No discharge. Cardiovascular: No JVD. Respiratory: Normal effort.  No stridor. GI: Non-distended. Skin: Warm and dry.  Intact. Psych: Lethargic.  Musc: No edema in extremities.  No tenderness in extremities. Neuro: Alert Motor:  LUE: shoulder abduction 1+/5, 0/5 distally, stable LLE: Hip flexion, knee extension 1+/5, ankle dorsiflexion 0/5, stable No tone increase  Assessment/Plan: 1. Functional deficits secondary to right paramedian pontine infarct which require 3+ hours per day of interdisciplinary therapy in a comprehensive inpatient rehab setting.  Physiatrist is providing close team supervision and 24 hour management of active medical problems listed below.  Physiatrist and rehab team continue to assess barriers to discharge/monitor patient progress toward functional and medical goals  Care Tool:  Bathing    Body parts bathed by patient: Chest, Abdomen, Face, Front perineal area, Right upper leg, Left upper leg   Body parts bathed by helper: Buttocks, Right arm, Left arm, Right lower leg,  Left lower leg     Bathing assist Assist Level: Moderate Assistance - Patient 50 - 74%     Upper Body Dressing/Undressing Upper body dressing   What is the patient wearing?: Pull over shirt    Upper body assist Assist Level: Moderate Assistance - Patient 50 - 74%    Lower Body Dressing/Undressing Lower body dressing      What is the patient wearing?: Incontinence brief, Pants     Lower body assist Assist for lower body dressing: Total Assistance - Patient < 25%     Toileting Toileting Toileting Activity did not occur (Clothing management and hygiene only): N/A (no void or bm)  Toileting assist Assist for toileting: Total Assistance - Patient < 25% Assistive Device Comment: urinal   Transfers Chair/bed transfer  Transfers assist  Chair/bed transfer activity did not occur: Safety/medical concerns  Chair/bed transfer assist level: Dependent - mechanical lift(Stedy)     Locomotion Ambulation   Ambulation assist      Assist level: Moderate Assistance - Patient 50 - 74% Assistive device: Walker-rolling Max distance: 18f   Walk 10 feet activity   Assist  Walk 10 feet activity did not occur: Safety/medical concerns(decreased strength/activity tolerance)  Assist level: Moderate Assistance - Patient - 50 - 74% Assistive device: Walker-rolling, Orthosis   Walk 50 feet activity   Assist Walk 50 feet with 2 turns activity did not occur: Safety/medical concerns(decreased strength/activity tolerance)  Assist level: Moderate Assistance - Patient - 515-  74% Assistive device: Walker-rolling, Orthosis    Walk 150 feet activity   Assist Walk 150 feet activity did not occur: Safety/medical concerns(decreased strength/activity tolerance)         Walk 10 feet on uneven surface  activity   Assist Walk 10 feet on uneven surfaces activity did not occur: Safety/medical concerns(decreased strength/activity tolerance)         Wheelchair     Assist Will  patient use wheelchair at discharge?: Yes Type of Wheelchair: Manual Wheelchair activity did not occur: Safety/medical concerns(Unable without skilled intervention, L hemi)  Wheelchair assist level: Supervision/Verbal cueing Max wheelchair distance: 128f    Wheelchair 50 feet with 2 turns activity    Assist    Wheelchair 50 feet with 2 turns activity did not occur: (Unable without skilled intervention, L hemi)   Assist Level: Supervision/Verbal cueing   Wheelchair 150 feet activity     Assist Wheelchair 150 feet activity did not occur: (Unable without skilled intervention, L hemi)   Assist Level: Supervision/Verbal cueing      Medical Problem List and Plan: 1.  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 Sessions limited by lethargy. Medications reviewed for sedating agents and none received. Bps have been stable. Will initiate Ritalin 577mdaily to stimulate patient and encourage increased participation in therapy.   1/31: Care team notes reviewed. Lethargy improved today.  2.  Antithrombotics: -DVT/anticoagulation: Lovenox             -antiplatelet therapy: Plavix 75 mg daily patient placed in [employer] XI inhibitor stroke prevention trial 3. Pain Management: Tylenol as needed  Voltaren gel changed to muscle rub right shoulder after discussion with neurology 4. Mood: Provide emotional support             -antipsychotic agents: N/A 5. Neuropsych: This patient is capable of making decisions on his own behalf. 6. Skin/Wound Care: Routine skin checks 7. Fluids/Electrolytes/Nutrition: Routine in and outs 8.  Chronic hyponatremia.  Low sodium in September through November 2020.    IVFs 100cc/hr d/ced  Na 130 on 1/29, labs ordered for Monday 9.  CLL/small B-cell lymphoma.  Follow-up Dr.Kale.  Patient last received chemotherapy  October 2020. 10.  Type 2 diabetes mellitus with hyperglycemia.  Hemoglobin A1c 5.9.  Glucophage currently held.  Continue sliding scale  Relatively controlled on 1/29, 1/30.  Monitor with increased mobility 11.  Hyperlipidemia.  Lipitor 12.  History of hypertension.    On no medications at present  Controlled on 1/29, 1/30, 1/31  Monitor with increased mobility 13. Mild Spasticity:  Cont to monitor  Controlled at present without medication-continue range of motion 14. OSA: refuses to wear CPAP 15. Hypoalbuminemia  Supplement initiated on 1/13 16. Leukopenia  WBCs 5.2 on 1/29  See #9  Cont to monitor 17. Acute on chronic anemia  Hb 10.0 on 1/29 18. Fever:   Persistent intermittent low-grade/borderline fevers  UA negative, urine culture negative  Blood cultures no growth FINAL  Chest x-ray on 1/19 reviewed, showing stable opacity, prolonged discussion with Heme/Onc regarding lymphoma and opacity-suggesting central origin.  Appreciate ID recs, recommending BAL.  Discussed with Pulm, to be evaluated today 19.  Sleep disturbance  Ambien 10 d/ced due to lethargy  Overall improved  LOS: 19 days A FACE TO FACE EVALUATION WAS PERFORMED  KrMartha Clan Seve Monette 09/22/2019, 11:47 AM

## 2019-09-23 ENCOUNTER — Inpatient Hospital Stay (HOSPITAL_COMMUNITY): Payer: Medicare Other | Admitting: Physical Therapy

## 2019-09-23 ENCOUNTER — Inpatient Hospital Stay (HOSPITAL_COMMUNITY): Payer: Medicare Other | Admitting: Speech Pathology

## 2019-09-23 ENCOUNTER — Inpatient Hospital Stay (HOSPITAL_COMMUNITY): Payer: Medicare Other

## 2019-09-23 ENCOUNTER — Inpatient Hospital Stay (HOSPITAL_COMMUNITY): Payer: Medicare Other | Admitting: Occupational Therapy

## 2019-09-23 LAB — BASIC METABOLIC PANEL
Anion gap: 12 (ref 5–15)
BUN: 10 mg/dL (ref 8–23)
CO2: 23 mmol/L (ref 22–32)
Calcium: 9.7 mg/dL (ref 8.9–10.3)
Chloride: 98 mmol/L (ref 98–111)
Creatinine, Ser: 1.02 mg/dL (ref 0.61–1.24)
GFR calc Af Amer: >60 mL/min (ref >60)
GFR calc non Af Amer: >60 mL/min (ref >60)
Glucose, Bld: 168 mg/dL — ABNORMAL HIGH (ref 70–99)
Potassium: 3.8 mmol/L (ref 3.5–5.1)
Sodium: 133 mmol/L — ABNORMAL LOW (ref 135–145)

## 2019-09-23 LAB — GLUCOSE, CAPILLARY: Glucose-Capillary: 108 mg/dL — ABNORMAL HIGH (ref 70–99)

## 2019-09-23 MED ORDER — ENOXAPARIN SODIUM 40 MG/0.4ML ~~LOC~~ SOLN
40.0000 mg | SUBCUTANEOUS | Status: DC
  Administered 2019-09-25 – 2019-09-27 (×3): 40 mg via SUBCUTANEOUS
  Filled 2019-09-23 (×3): qty 0.4

## 2019-09-23 NOTE — Progress Notes (Signed)
Physical Therapy Session Note  Patient Details  Name: [name] [name] MRN: [id number] Date of Birth: [birth year]  Today's Date: 09/23/2019 PT Individual Time: 1505-1530 PT Individual Time Calculation (min): 25 min   Short Term Goals: Week 4:  PT Short Term Goal 1 (Week 4): Pt will perform bed<>chair transfer with LRAD CGA PT Short Term Goal 2 (Week 4): Pt will ambulate 57ft with LRAD min A PT Short Term Goal 3 (Week 4): Pt will perform car transfer with LRAD CGA  Skilled Therapeutic Interventions/Progress Updates:  Pt received in w/c, on speaker phone with son with pt reporting his son has questions re: pt. Therapist spoke with pt's son who denied questions at this time. Attempted to take pt to dayroom to participate in treatment but pt frustrated he did not get to eat much of his lunch prior to dining services removing tray from the room. Offered pt snacks that we have on the floor or assisting him with calling dining services with pt requesting the latter. Attempted to contact dining services via telephone but no answer after prolonged wait time & pt agreeable to trying to call again in a few minutes. Transported pt to/from dayroom via w/c dependent assist for time management. Pt utilized cybex kinetron from w/c level with task focusing on LLE strengthening & NMR. At end of session pt left in w/c in room, with NT assisting him with calling dining services, call bell in reach & chair alarm donned.  Pt also frustrated re: not knowing time of upcoming procedure. Pt demonstrates impaired awareness asking this therapist why we haven't used kinetron before with therapist educating him that this is only the 2nd time this therapist has worked with him.   Therapy Documentation Precautions:  Precautions Precautions: Fall Precaution Comments: L hemiparesis Restrictions Weight Bearing Restrictions: No   Pain: No behaviors demonstrating pain & no c/o reported.    Therapy/Group: Individual  Therapy  Waunita Schooner 09/23/2019, 3:35 PM

## 2019-09-23 NOTE — Progress Notes (Signed)
Physical Therapy Weekly Progress Note  Patient Details  Name: [name] [name] MRN: [id number] Date of Birth: October 15, 1955  Beginning of progress report period: September 04, 2019 End of progress report period: September 23, 2019  Today's Date: 09/23/2019 PT Individual Time: 0900-0958 PT Individual Time Calculation (min): 58 min   Patient has met 0 of 3 short term goals. Pt with decline in functional mobility. Pt with decreased participation in therapy due to fatigue, LE weakness, decreased postural control, decreased motor planning, and poor endurance with activity. Pt currently requires min A for bed mobility, mod/max A for stand<>pivot transfers, and min A for sit<>stand with RW. Pt has been unable to ambulate due to decreased postural control and alignment, fatigue, LE weakness, and decreased motor planning. Pt demonstrates strong L lateral lean in sitting and standing however, sitting balance fluctuates from mod A-supervision. Pt is constantly having difficulty keeping eyes open through therapy sessions, requiring max verbal cues to remain awake.   Patient continues to demonstrate the following deficits muscle weakness, impaired timing and sequencing, unbalanced muscle activation, decreased coordination and decreased motor planning and decreased sitting balance, decreased standing balance, decreased postural control, hemiplegia and decreased balance strategies and therefore will continue to benefit from skilled PT intervention to increase functional independence with mobility.  Patient progressing toward long term goals..  Continue plan of care.  PT Short Term Goals Week 3:  PT Short Term Goal 1 (Week 3): Pt will perform bed<>chair transfer with LRAD CGA PT Short Term Goal 1 - Progress (Week 3): Progressing toward goal PT Short Term Goal 2 (Week 3): Pt will ambulate 29f with LRAD CGA PT Short Term Goal 2 - Progress (Week 3): Progressing toward goal PT Short Term Goal 3 (Week 3): Pt will perform car  transfer with LRAD CGA PT Short Term Goal 3 - Progress (Week 3): Progressing toward goal Week 4:  PT Short Term Goal 1 (Week 4): Pt will perform bed<>chair transfer with LRAD CGA PT Short Term Goal 2 (Week 4): Pt will ambulate 224fwith LRAD min A PT Short Term Goal 3 (Week 4): Pt will perform car transfer with LRAD CGA  Skilled Therapeutic Interventions/Progress Updates:  Ambulation/gait training;Discharge planning;Functional mobility training;Psychosocial support;Therapeutic Activities;Visual/perceptual remediation/compensation;Balance/vestibular training;Disease management/prevention;Neuromuscular re-education;Skin care/wound management;Therapeutic Exercise;Wheelchair propulsion/positioning;Cognitive remediation/compensation;DME/adaptive equipment instruction;Pain management;Splinting/orthotics;UE/LE Strength taining/ROM;Community reintegration;Functional electrical stimulation;Patient/family education;Stair training;UE/LE Coordination activities    Today's Interventions: Received pt supine in bed, pt agreeable to therapy, and denied any pain during today's session. Session focused on functional mobility/transfers, dynamic sitting/standing balance, LE strength, postural control and midline orientation, NMR, pre-gait stepping, and improved endurance with activity. Pt donned pants in supine with heavy mod A and extensive time (pt wanting to do as much as possible himself). Pt transferred supine<>sitting EOB min A with use of bedrails and HOB elevated. Donned L AFO and shoes with max A. Pt attempted to transfer stand<>pivot with RW to L, however pt demonstrated increased difficulty motor planning and unable to lift L hip when turning; returned to bed as transfer unsafe. Pt transferred squat<>pivot bed<>WC mod A with manual facilitation for L LE foot placement. Pt washed face with R UE seated in WC with washcloth supervision. [facility]. Pt transferred  stand<>pivot with RW WC<>mat mod A x 2 additional trials throughout session with max verbal cueing for technique, step sequence, and foot placement. Pt with improved sitting balance today, requiring only supervision. Pt transferred sit<>stand x3 for approximately 2 minutes with RW  min A. While standing pt worked on Haematologist for visual feedback and weight shifting to the R with manual facilitation to keep L UE on handgrip. Pt worked on pre-gait stepping with R LE x 8 reps min/mod A with emphasis on weightbearing through L LUE. Pt with continued strong L lateral lean. Worked on weight shifting to R to clear L LE to step forward however pt with decreased L hip flexor strength and unable to step foot forward. Pt required multiple rest breaks throughout session due to increased fatigue. Pt with increased muscle shaking with fatigue. Concluded session with pt sitting in WC, needs within reach, seatbelt alarm on, L lap tray on. Therapist provided drink to patient.   Therapy Documentation Precautions:  Precautions Precautions: Fall Precaution Comments: L hemiparesis Restrictions Weight Bearing Restrictions: No  Therapy/Group: Individual Therapy  Alfonse Alpers PT, DPT  09/23/2019, 7:54 AM

## 2019-09-23 NOTE — Progress Notes (Addendum)
Called pt son Cecilie Lowers [phone] informed him of bronchoscopy with diagnostic brushing and washing to insure no PNU before starting chemotherapy. Obtained consent and placed in chart'

## 2019-09-23 NOTE — Progress Notes (Signed)
Occupational Therapy Session Note  Patient Details  Name: [name] [name] MRN: [id number] Date of Birth: 13-Oct-1955  Today's Date: 09/23/2019 OT Individual Time: 1300-1400 OT Individual Time Calculation (min): 60 min    Short Term Goals: Week 3:  OT Short Term Goal 1 (Week 3): Pt will recall hemi dressing techniques with no VC to don shirt with mod assist OT Short Term Goal 2 (Week 3): Pt will manage LUE prior to transfers to demo improved L attention OT Short Term Goal 3 (Week 3): Pt will complete toilet transfer with Mod assist OT Short Term Goal 4 (Week 3): Pt will complete LB dressing with mod assist  Skilled Therapeutic Interventions/Progress Updates:    Treatment session with focus on hemi-technique with UB dressing, sit > stand, standing balance, and LUE NMR. Pt received upright in w/c declining bathing/dressing but expressing desire to don long sleeve shirt for warmth.  Educated and demonstrate hemi-technique as pt demonstrating difficulty when donning shirt, pt still requiring mod assist when donning shirt.  Engaged in sit > stand at high-low table with focus on anterior weight shift.  Pt with improved standing balance this session, intermittent lean to Lt but able to correct with min multimodal cues.  Engaged in [place] in standing with towel glides.  Therapist providing hand over hand facilitation, pt able to elicit horizontal adduction and retraction.  Facilitation at trunk to maintain midline standing posture.  Noted full body tremors in standing, ceases in sitting.  Pt reports no additional stimulus was causing "shaking" in standing.  Returned to room and left upright in w/c with seat belt alarm on and all needs in reach.  Therapy Documentation Precautions:  Precautions Precautions: Fall Precaution Comments: L hemiparesis Restrictions Weight Bearing Restrictions: No General:   Vital Signs: Therapy Vitals Temp: 98.7 F (37.1 C) Pulse Rate: 72 Resp: 17 BP: 128/71 Patient  Position (if appropriate): Sitting Oxygen Therapy SpO2: 94 % O2 Device: Room Air Pain:  Pt with no c/o pain   Therapy/Group: Individual Therapy  Simonne Come 09/23/2019, 2:57 PM

## 2019-09-23 NOTE — Plan of Care (Signed)
  Problem: RH Ambulation Goal: LTG Patient will ambulate in community environment (PT) Description: LTG: Patient will ambulate in community environment, # of feet with assistance (PT). Outcome: Not Applicable Flowsheets (Taken 09/23/2019 1216) LTG: Pt will ambulate in community environ  assist needed:: (D/C) -- Note: D/C   Problem: RH Wheelchair Mobility Goal: LTG Patient will propel w/c in controlled environment (PT) Description: LTG: Patient will propel wheelchair in controlled environment, # of feet with assist (PT) 09/23/2019 1225 by Blenda Nicely, PT Flowsheets (Taken 09/23/2019 1225) LTG: Pt will propel w/c in controlled environ  assist needed:: (downgraded due to R hemi, fatigue, and decreased motor planning) Supervision/Verbal cueing LTG: Propel w/c distance in controlled environment: 71ft Note: downgraded due to R hemi, fatigue, and decreased motor planning 09/23/2019 1225 by Blenda Nicely, PT Reactivated 09/23/2019 1216 by Blenda Nicely, PT Outcome: Not Applicable Flowsheets (Taken 09/23/2019 1216) LTG: Pt will propel w/c in controlled environ  assist needed:: (downgraded due to R hemi, fatigue, decreased motor planning) Supervision/Verbal cueing LTG: Propel w/c distance in controlled environment: 43ft Note: downgraded due to R hemi, fatigue, decreased motor planning Goal: LTG Patient will propel w/c in home environment (PT) Description: LTG: Patient will propel wheelchair in home environment, # of feet with assistance (PT). 09/23/2019 1225 by Blenda Nicely, PT Flowsheets (Taken 09/23/2019 1225) LTG: Pt will propel w/c in home environ  assist needed:: (downgraded due to R hemi, fatigue, and decreased motor planning) Supervision/Verbal cueing LTG: Propel w/c distance in home environment: 33ft Note: downgraded due to R hemi, fatigue, and decreased motor planning 09/23/2019 1225 by Blenda Nicely, PT Reactivated 09/23/2019 1216 by Blenda Nicely, PT Outcome: Not Applicable Flowsheets  (Taken 09/23/2019 1216) LTG: Pt will propel w/c in home environ  assist needed:: (downgraded due to R hemi, fatigue, decreased motor planning) Supervision/Verbal cueing LTG: Propel w/c distance in home environment: 25ft Note: downgraded due to R hemi, fatigue, decreased motor planning Goal: LTG Patient will propel w/c in community environment (PT) Description: LTG: Patient will propel wheelchair in community environment, # of feet with assist (PT) Outcome: Not Applicable Flowsheets (Taken 09/23/2019 1216) LTG: Pt will propel w/c in community environ  assist needed:: (D/C) -- Note: D/C   Problem: RH Stairs Goal: LTG Patient will ambulate up and down stairs w/assist (PT) Description: LTG: Patient will ambulate up and down # of stairs with assistance (PT) Outcome: Not Applicable Flowsheets (Taken 09/23/2019 1216) LTG: Pt will ambulate up/down stairs assist needed:: (D/C) -- Note: D/C

## 2019-09-23 NOTE — Progress Notes (Signed)
Speech Language Pathology Weekly Progress and Session Note  Patient Details  Name: [name] [name] MRN: [id number] Date of Birth: 1956-04-08  Beginning of progress report period: September 15, 2018 End of progress report period: September 22, 2018  Today's Date: 09/23/2019 SLP Individual Time: 0700-0800 SLP Individual Time Calculation (min): 60 min  Short Term Goals: Week 3: SLP Short Term Goal 1 (Week 3): STG=LTG due to short ELOS SLP Short Term Goal 1 - Progress (Week 3): Not met    New Short Term Goals: Week 4: SLP Short Term Goal 1 (Week 4): Pt will complete basic familiar problem solving tasks with Max A cues. SLP Short Term Goal 2 (Week 4): Pt will sustain attention to basic task for 5 minutes with Max A cues. SLP Short Term Goal 3 (Week 4): Pt will recall important medical facts related to self with Max  A cues. SLP Short Term Goal 4 (Week 4): Pt will utilize memory strategies to recall daily information with Max A cues.  Weekly Progress Updates:  Pt has made minimal progress this reporting period d/t continued lethargy. As a result, his LTGs have been downgraded to Maximal assistance. At this time, there isn't any medical explanation for pt's lethargy and he is scheduled for bronc to evaluation for any atypical pneumonia. Pt continues to require Total A to Max A to complete activities, sustain attention, recall information and overall awareness. As such skilled ST is required to increase functional independence and reduce caregiver burden.     Intensity: Minumum of 1-2 x/day, 30 to 90 minutes Frequency: 3 to 5 out of 7 days Duration/Length of Stay: 2/6 Treatment/Interventions: Cognitive remediation/compensation;Internal/external aids;Cueing hierarchy;Environmental controls;Therapeutic Activities;Patient/family education;Functional tasks   Daily Session  Skilled Therapeutic Interventions:   Skilled treatment session targeted cognition goals. Pt sleeping but easily aroused for  session. Pt able to set-up his breakfast tray with supervision level support d/t deficit in sustained attention and visual deficits. Pt declined wearing eye patch. Pt also refused turning TV off and therefore he required Max A verbal cues faded to Mod A cues for sustained attention to self-feeding every 2 to 3 minutes. No overt s/s of aspiration or dysphagia were observed during consumption. Pt leaning to his left and became very frustrated with repositioning to neutral. He stated that he wasn't leaning and once in neutral position he stated that now he was leaning. Pt with no awareness of overall deficits. Pt was Total A for recall of pending bronc procedure. SLP further facilitated session by providing Mod A for problem solving during game of checkers. He also required more than a reasonable amount of time to make each move.  This is decline in ability over previous game of checkers ~ 1 week ago. Pt was continent of urine with physical assistance provided for use of urine. Pt left upright in bed, bed alarm on and all needs within reach.      General    Pain    Therapy/Group: Individual Therapy  Justina Bertini 09/23/2019, 10:26 AM

## 2019-09-23 NOTE — Progress Notes (Signed)
NAME:  [name] [name], MRN:  [id number], DOB:  09/04/1955, LOS: 61 ADMISSION DATE:  09/03/2019, CONSULTATION DATE:  09/20/2019 REFERRING FX:TKWIO Posey Pronto  , CHIEF COMPLAINT:  Needs  Bronch and BAL as inpatient  Brief History   [name] [name] is a 64 y.o. 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 He has had cough, fever, and opacity per  RML with inability to produce sputum specimen since admission. ID recommends bronch with BAL to rule out atypical infection prior to resuming chemo. PCCM have been asked to evaluate for inpatient bronch/ BAL and sputum evaluation.  History of present illness   [name] [name] is a 64 y.o. 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  He has had chills for a month and has had low grade fevers 100-101 F with leukopenia throughout current  admission.He has had 1 month history of night sweats and endorses  chills consistently since November/December. He also describes weight loss but uncertain as to how much (in the hospital 3 weeks now).  CXR/ CT Chest  shows right medial lower  lobe opacity, and right middle lobe inferior opacity. Pt. has previously had similar findings per left lung that self resolved. He has not had antibiotic treatment as there is oncern that his fevers/leucopenia are due to lymphoproliferative process The patient is unable to produce a sputum specimen. ID recommends that with cough history, infiltrate on imaging  and upcoming chemotherapy, that patient have a bronch with BAL to ensure no atypical infection prior to   resuming  chemo treatments.PCCM have been consulted for inpatient bronch and BAL and sputum evaluation.   Past Medical History   Past Medical History:  Diagnosis Date  . Allergic rhinitis, cause unspecified   . Backache, unspecified   . Body mass index 33.0-33.9, adult   . Diabetes mellitus without complication ([place])    Type II  . Elevated blood pressure reading without diagnosis of hypertension   . Esophageal reflux   . Generalized pain   . Heartburn   . Insomnia, unspecified   . 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 Other abnormal blood chemistry   . Other and unspecified hyperlipidemia   . Other dyspnea and respiratory abnormality   . Other nonspecific findings on examination of blood(790.99)   . Pain in joint, lower leg   . Pneumonia    1968  . Sleep apnea    Does not use or own a CPAP  . Tobacco use disorder   . Unspecified essential hypertension     Significant Hospital Events   09/03/2019>> Admission with   Consults:  09/16/2019>> ID 09/20/2019>>PCCM  Procedures:    Significant Diagnostic Tests:  09/10/2019 CXR Stable opacity in the medial aspect of the right lung base similar to that seen on prior exam.   08/28/2019 CT Chest>> Improving lymphadenopathy in the chest, abdomen, and pelvis. Dominant axillary nodes measure up to 10 mm, previously 2.2 cm. Dominant right common iliac node measures 1.8 cm, previously 4.0 cm.  Prior left upper lobe/suprahilar nodular opacity has resolved. New patchy/nodular opacities in the right  middle and lower lobes. While lymphomatous involvement is possible, the appearance favors Infection/inflammation.  Micro Data:  1/18 >>Blood: No Growth Final 1/18>> Urine: No Growth Final  Antimicrobials:  None   Interim history/subjective:  No events overnight.  Breathing well, working with PT.  Objective   Blood pressure 126/72, pulse 77, temperature 100.2 F (37.9 C), resp. rate 17, height  _0  (1.702 m), weight 73.8 kg, SpO2 97 %.        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  Unable to cough up sputum for micro to R/O atypical infection prior to resuming chemo   - NPO MN for BAL/brushing of each of following: RUL posterior subsegment (if we can reach), RML, RLL basilar subsegment.  Will try to do with conscious sedation. - Discussed risks/benefits with patient at time of evaluation, he agrees to proceed  Erskine Emery MD PCCM

## 2019-09-23 NOTE — Progress Notes (Signed)
[place] PHYSICAL MEDICINE & REHABILITATION PROGRESS NOTE  Subjective/Complaints: Patient seen sitting up in bed eating breakfast working with therapies.  He states he slept well overnight.  He states he had a good weekend.  Discussed swallowing with therapies.  Discussed plan for biopsy tomorrow  ROS: Denies cough, CP, SOB, N/V/D  Objective: Vital Signs: Blood pressure 126/72, pulse 77, temperature 100.2 F (37.9 C), resp. rate 17, height _0  (1.702 m), weight 73.8 kg, SpO2 97 %. No results found. No results for input(s): WBC, HGB, HCT, PLT in the last 72 hours. No results for input(s): NA, K, CL, CO2, GLUCOSE, BUN, CREATININE, CALCIUM in the last 72 hours.  Physical Exam: BP 126/72   Pulse 77   Temp 100.2 F (37.9 C)   Resp 17   Ht _1  (1.702 m)   Wt 73.8 kg   SpO2 97%   BMI 25.48 kg/m   Constitutional: No distress . Vital signs reviewed. HENT: Normocephalic.  Atraumatic. Eyes: EOMI. No discharge. Cardiovascular: No JVD. Respiratory: Normal effort.  No stridor. GI: Non-distended. Skin: Warm and dry.  Intact. Psych: Normal mood.  Normal behavior. Musc: No edema in extremities.  No tenderness in extremities. Neuro: Alert Motor:  LUE: shoulder abduction 1+/5, 0/5 distally, unchanged LLE: Hip flexion, knee extension 1+/5, ankle dorsiflexion 0/5, unchanged No increase in tone noted   Assessment/Plan: 1. Functional deficits secondary to right paramedian pontine infarct which require 3+ hours per day of interdisciplinary therapy in a comprehensive inpatient rehab setting.  Physiatrist is providing close team supervision and 24 hour management of active medical problems listed below.  Physiatrist and rehab team continue to assess barriers to discharge/monitor patient progress toward functional and medical goals  Care Tool:  Bathing    Body parts bathed by patient: Chest, Abdomen, Face, Front perineal area, Right upper leg, Left upper leg   Body parts bathed by  helper: Buttocks, Right arm, Left arm, Right lower leg, Left lower leg     Bathing assist Assist Level: Moderate Assistance - Patient 50 - 74%     Upper Body Dressing/Undressing Upper body dressing   What is the patient wearing?: Pull over shirt    Upper body assist Assist Level: Moderate Assistance - Patient 50 - 74%    Lower Body Dressing/Undressing Lower body dressing      What is the patient wearing?: Incontinence brief, Pants     Lower body assist Assist for lower body dressing: Total Assistance - Patient < 25%     Toileting Toileting Toileting Activity did not occur (Clothing management and hygiene only): N/A (no void or bm)  Toileting assist Assist for toileting: Total Assistance - Patient < 25% Assistive Device Comment: urinal   Transfers Chair/bed transfer  Transfers assist  Chair/bed transfer activity did not occur: Safety/medical concerns  Chair/bed transfer assist level: Moderate Assistance - Patient 50 - 74%(steady)     Locomotion Ambulation   Ambulation assist      Assist level: Moderate Assistance - Patient 50 - 74% Assistive device: Walker-rolling Max distance: 23f   Walk 10 feet activity   Assist  Walk 10 feet activity did not occur: Safety/medical concerns(decreased strength/activity tolerance)  Assist level: Moderate Assistance - Patient - 50 - 74% Assistive device: Walker-rolling, Orthosis   Walk 50 feet activity   Assist Walk 50 feet with 2 turns activity did not occur: Safety/medical concerns(decreased strength/activity tolerance)  Assist level: Moderate Assistance - Patient - 50 - 74% Assistive device: Walker-rolling, Orthosis  Walk 150 feet activity   Assist Walk 150 feet activity did not occur: Safety/medical concerns(decreased strength/activity tolerance)         Walk 10 feet on uneven surface  activity   Assist Walk 10 feet on uneven surfaces activity did not occur: Safety/medical concerns(decreased  strength/activity tolerance)         Wheelchair     Assist Will patient use wheelchair at discharge?: Yes Type of Wheelchair: Manual Wheelchair activity did not occur: Safety/medical concerns(Unable without skilled intervention, L hemi)  Wheelchair assist level: Supervision/Verbal cueing Max wheelchair distance: 163f    Wheelchair 50 feet with 2 turns activity    Assist    Wheelchair 50 feet with 2 turns activity did not occur: (Unable without skilled intervention, L hemi)   Assist Level: Supervision/Verbal cueing   Wheelchair 150 feet activity     Assist Wheelchair 150 feet activity did not occur: (Unable without skilled intervention, L hemi)   Assist Level: Supervision/Verbal cueing      Medical Problem List and Plan: 1.  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  Antithrombotics: -DVT/anticoagulation: Lovenox             -antiplatelet therapy: Plavix 75 mg daily patient placed in [employer] XI inhibitor stroke prevention trial 3. Pain Management: Tylenol as needed  Voltaren gel changed to muscle rub right shoulder after discussion with neurology 4. Mood: Provide emotional support  Ritalin started             -antipsychotic agents: N/A 5. Neuropsych: This patient is capable of making decisions on his own behalf. 6. Skin/Wound Care: Routine skin checks 7. Fluids/Electrolytes/Nutrition: Routine in and outs 8.  Chronic hyponatremia.  Low sodium in September through November 2020.    IVFs 100cc/hr d/ced  Na 130 on 1/29, labs pending 9.  CLL/small B-cell lymphoma.  Follow-up Dr.Kale.  Patient last received chemotherapy October 2020. 10.  Type 2 diabetes mellitus with hyperglycemia.  Hemoglobin A1c 5.9.  Glucophage currently held.  Continue sliding scale  Relatively controlled 2/1  Monitor with increased mobility 11.  Hyperlipidemia.   Lipitor 12.  History of hypertension.    On no medications at present  Controlled on 2/1  Monitor with increased mobility 13. Mild Spasticity:  Cont to monitor  Controlled at present without medication-continue range of motion 14. OSA: refuses to wear CPAP 15. Hypoalbuminemia  Supplement initiated on 1/13 16. Leukopenia  WBCs 5.2 on 1/29, labs ordered for tomorrow,  See #9  Cont to monitor 17. Acute on chronic anemia  Hb 10.0 on 1/29, labs ordered for tomorrow 18. Fever:   Persistent intermittent low-grade/borderline fevers  UA negative, urine culture negative  Blood cultures no growth FINAL  Chest x-ray on 1/19 reviewed, showing stable opacity, prolonged discussion with Heme/Onc regarding lymphoma and opacity-suggesting central origin.  Appreciate ID recs, recommending BAL.  Discussed with pulmonary, plans for biopsy tomorrow.  N.p.o. after midnight. 19.  Sleep disturbance  Ambien 10 d/ced due to lethargy  Overall improved  LOS: 20 days A FACE TO FACE EVALUATION WAS PERFORMED  Keshawna Dix ALorie Phenix2/08/2019, 8:43 AM

## 2019-09-23 NOTE — Plan of Care (Signed)
  Problem: RH Balance Goal: LTG Patient will maintain dynamic standing with ADLs (OT) Description: LTG:  Patient will maintain dynamic standing balance with assist during activities of daily living (OT)  Flowsheets (Taken 09/23/2019 1502) LTG: Pt will maintain dynamic standing balance during ADLs with: (downgraded) Minimal Assistance - Patient > 75% Note: Downgraded due to increased Lt lean and decreased arousal, requiring increased assistance with functional mobility/tasks   Problem: RH Bathing Goal: LTG Patient will bathe all body parts with assist levels (OT) Description: LTG: Patient will bathe all body parts with assist levels (OT) Flowsheets (Taken 09/23/2019 1503) LTG: Pt will perform bathing with assistance level/cueing: (downgraded) Minimal Assistance - Patient > 75% Note: Downgraded due to increased Lt lean and decreased arousal, requiring increased assistance with functional mobility/tasks   Problem: RH Dressing Goal: LTG Patient will perform upper body dressing (OT) Description: LTG Patient will perform upper body dressing with assist, with/without cues (OT). Flowsheets (Taken 09/23/2019 1503) LTG: Pt will perform upper body dressing with assistance level of: (downgraded) Minimal Assistance - Patient > 75% Note: Downgraded due to increased Lt lean and decreased arousal, requiring increased assistance with functional mobility/tasks Goal: LTG Patient will perform lower body dressing w/assist (OT) Description: LTG: Patient will perform lower body dressing with assist, with/without cues in positioning using equipment (OT) Flowsheets (Taken 09/23/2019 1503) LTG: Pt will perform lower body dressing with assistance level of: (downgraded) Minimal Assistance - Patient > 75% Note: Downgraded due to increased Lt lean and decreased arousal, requiring increased assistance with functional mobility/tasks   Problem: RH Toileting Goal: LTG Patient will perform toileting task (3/3 steps) with assistance  level (OT) Description: LTG: Patient will perform toileting task (3/3 steps) with assistance level (OT)  Flowsheets (Taken 09/23/2019 1503) LTG: Pt will perform toileting task (3/3 steps) with assistance level: (downgraded) Minimal Assistance - Patient > 75% Note: Downgraded due to increased Lt lean and decreased arousal, requiring increased assistance with functional mobility/tasks   Problem: RH Functional Use of Upper Extremity Goal: LTG Patient will use RT/LT upper extremity as a (OT) Description: LTG: Patient will use right/left upper extremity as a stabilizer/gross assist/diminished/nondominant/dominant level with assist, with/without cues during functional activity (OT) Flowsheets (Taken 09/23/2019 1503) LTG: Use of upper extremity in functional activities: LUE as a stabilizer LTG: Pt will use upper extremity in functional activity with assistance level of: Minimal Assistance - Patient > 75%   Problem: RH Toilet Transfers Goal: LTG Patient will perform toilet transfers w/assist (OT) Description: LTG: Patient will perform toilet transfers with assist, with/without cues using equipment (OT) Flowsheets (Taken 09/23/2019 1503) LTG: Pt will perform toilet transfers with assistance level of: (downgraded) Minimal Assistance - Patient > 75% Note: Downgraded due to increased Lt lean and decreased arousal, requiring increased assistance with functional mobility/tasks   Problem: RH Tub/Shower Transfers Goal: LTG Patient will perform tub/shower transfers w/assist (OT) Description: LTG: Patient will perform tub/shower transfers with assist, with/without cues using equipment (OT) Flowsheets (Taken 09/23/2019 1503) LTG: Pt will perform tub/shower stall transfers with assistance level of: (downgraded) Minimal Assistance - Patient > 75% Note: Downgraded due to increased Lt lean and decreased arousal, requiring increased assistance with functional mobility/tasks

## 2019-09-24 ENCOUNTER — Inpatient Hospital Stay (HOSPITAL_COMMUNITY): Payer: Medicare Other | Admitting: Speech Pathology

## 2019-09-24 ENCOUNTER — Inpatient Hospital Stay (HOSPITAL_COMMUNITY): Payer: Medicare Other | Admitting: Occupational Therapy

## 2019-09-24 ENCOUNTER — Encounter (HOSPITAL_COMMUNITY): Payer: Self-pay | Admitting: Physical Medicine & Rehabilitation

## 2019-09-24 ENCOUNTER — Inpatient Hospital Stay (HOSPITAL_COMMUNITY): Payer: Medicare Other | Admitting: Certified Registered"

## 2019-09-24 ENCOUNTER — Encounter (HOSPITAL_COMMUNITY)
Admission: RE | Disposition: A | Discharge status: Home Health | Payer: Self-pay | Source: Intra-hospital | Attending: Physical Medicine & Rehabilitation

## 2019-09-24 ENCOUNTER — Ambulatory Visit (HOSPITAL_COMMUNITY): Admission: RE | Admit: 2019-09-24 | Payer: Medicare Other | Source: Home / Self Care | Admitting: Internal Medicine

## 2019-09-24 ENCOUNTER — Inpatient Hospital Stay (HOSPITAL_COMMUNITY): Payer: Medicare Other

## 2019-09-24 HISTORY — PX: BRONCHIAL BRUSHINGS: SHX5108

## 2019-09-24 HISTORY — PX: VIDEO BRONCHOSCOPY: SHX5072

## 2019-09-24 HISTORY — PX: BRONCHIAL WASHINGS: SHX5105

## 2019-09-24 LAB — CBC WITH DIFFERENTIAL/PLATELET
Abs Immature Granulocytes: 0.09 10*3/uL — ABNORMAL HIGH (ref 0.00–0.07)
Basophils Absolute: 0.1 10*3/uL (ref 0.0–0.1)
Basophils Relative: 1 %
Eosinophils Absolute: 0.3 10*3/uL (ref 0.0–0.5)
Eosinophils Relative: 5 %
HCT: 28.8 % — ABNORMAL LOW (ref 39.0–52.0)
Hemoglobin: 9.5 g/dL — ABNORMAL LOW (ref 13.0–17.0)
Immature Granulocytes: 1 %
Lymphocytes Relative: 6 %
Lymphs Abs: 0.4 10*3/uL — ABNORMAL LOW (ref 0.7–4.0)
MCH: 23.7 pg — ABNORMAL LOW (ref 26.0–34.0)
MCHC: 33 g/dL (ref 30.0–36.0)
MCV: 71.8 fL — ABNORMAL LOW (ref 80.0–100.0)
Monocytes Absolute: 0.8 10*3/uL (ref 0.1–1.0)
Monocytes Relative: 12 %
Neutro Abs: 4.7 10*3/uL (ref 1.7–7.7)
Neutrophils Relative %: 75 %
Platelets: 318 10*3/uL (ref 150–400)
RBC: 4.01 MIL/uL — ABNORMAL LOW (ref 4.22–5.81)
RDW: 16.3 % — ABNORMAL HIGH (ref 11.5–15.5)
WBC: 6.3 10*3/uL (ref 4.0–10.5)
nRBC: 0 % (ref 0.0–0.2)

## 2019-09-24 LAB — BODY FLUID CELL COUNT WITH DIFFERENTIAL
Eos, Fluid: 1 %
Eos, Fluid: 1 %
Lymphs, Fluid: 1 %
Lymphs, Fluid: 4 %
Monocyte-Macrophage-Serous Fluid: 2 % — ABNORMAL LOW (ref 50–90)
Monocyte-Macrophage-Serous Fluid: 73 % (ref 50–90)
Neutrophil Count, Fluid: 22 % (ref 0–25)
Neutrophil Count, Fluid: 96 % — ABNORMAL HIGH (ref 0–25)
Total Nucleated Cell Count, Fluid: 108 cu mm (ref 0–1000)
Total Nucleated Cell Count, Fluid: 9900 cu mm — ABNORMAL HIGH (ref 0–1000)

## 2019-09-24 LAB — GLUCOSE, CAPILLARY
Glucose-Capillary: 116 mg/dL — ABNORMAL HIGH (ref 70–99)
Glucose-Capillary: 81 mg/dL (ref 70–99)

## 2019-09-24 SURGERY — VIDEO BRONCHOSCOPY WITHOUT FLUORO
Anesthesia: General

## 2019-09-24 MED ORDER — LIDOCAINE HCL (PF) 1 % IJ SOLN
INTRAMUSCULAR | Status: AC
  Filled 2019-09-24: qty 30

## 2019-09-24 MED ORDER — LACTATED RINGERS IV SOLN: INTRAVENOUS | Status: DC | PRN

## 2019-09-24 MED ORDER — MIDAZOLAM HCL 2 MG/2ML IJ SOLN
INTRAMUSCULAR | Status: DC | PRN
  Administered 2019-09-24 (×2): 1 mg via INTRAVENOUS

## 2019-09-24 MED ORDER — PROPOFOL 10 MG/ML IV BOLUS
INTRAVENOUS | Status: DC | PRN
  Administered 2019-09-24: 50 mg via INTRAVENOUS
  Administered 2019-09-24: 100 mg via INTRAVENOUS

## 2019-09-24 MED ORDER — DEXAMETHASONE SODIUM PHOSPHATE 10 MG/ML IJ SOLN
INTRAMUSCULAR | Status: DC | PRN
  Administered 2019-09-24: 10 mg via INTRAVENOUS

## 2019-09-24 MED ORDER — ONDANSETRON HCL 4 MG/2ML IJ SOLN
INTRAMUSCULAR | Status: DC | PRN
  Administered 2019-09-24: 4 mg via INTRAVENOUS

## 2019-09-24 MED ORDER — FENTANYL CITRATE (PF) 100 MCG/2ML IJ SOLN
INTRAMUSCULAR | Status: DC | PRN
  Administered 2019-09-24: 50 ug via INTRAVENOUS

## 2019-09-24 MED ORDER — PROPOFOL 500 MG/50ML IV EMUL
INTRAVENOUS | Status: DC | PRN
  Administered 2019-09-24: 75 ug/kg/min via INTRAVENOUS

## 2019-09-24 MED ORDER — GLYCOPYRROLATE PF 0.2 MG/ML IJ SOSY
PREFILLED_SYRINGE | INTRAMUSCULAR | Status: DC | PRN
  Administered 2019-09-24: .1 mg via INTRAVENOUS

## 2019-09-24 MED ORDER — PHENYLEPHRINE HCL 0.25 % NA SOLN
1.0000 | Freq: Four times a day (QID) | NASAL | Status: DC | PRN
  Filled 2019-09-24: qty 15

## 2019-09-24 MED ORDER — LIDOCAINE HCL URETHRAL/MUCOSAL 2 % EX GEL: 1.0000 "application " | Freq: Once | CUTANEOUS | Status: DC

## 2019-09-24 MED ORDER — BUTAMBEN-TETRACAINE-BENZOCAINE 2-2-14 % EX AERO: 1.0000 | INHALATION_SPRAY | Freq: Once | CUTANEOUS | Status: DC

## 2019-09-24 MED ORDER — LIDOCAINE 2% (20 MG/ML) 5 ML SYRINGE
INTRAMUSCULAR | Status: DC | PRN
  Administered 2019-09-24: 100 mg via INTRAVENOUS

## 2019-09-24 NOTE — Transfer of Care (Signed)
Immediate Anesthesia Transfer of Care Note  Patient: [name] [name]  Procedure(s) Performed: VIDEO BRONCHOSCOPY WITHOUT FLUORO (N/A )  Patient Location: Endoscopy Unit  Anesthesia Type:General  Level of Consciousness: drowsy  Airway & Oxygen Therapy: Patient Spontanous Breathing and Patient connected to face mask oxygen  Post-op Assessment: Report given to RN and Post -op Vital signs reviewed and stable  Post vital signs: Reviewed and stable  Last Vitals:  Vitals Value Taken Time  BP 129/86 09/24/19 1346  Temp    Pulse 72 09/24/19 1346  Resp 13 09/24/19 1346  SpO2 100 % 09/24/19 1346  Vitals shown include unvalidated device data.  Last Pain:  Vitals:   09/24/19 1233  TempSrc: Temporal  PainSc: 0-No pain      Patients Stated Pain Goal: 1 ([id number])  Complications: No apparent anesthesia complications

## 2019-09-24 NOTE — Progress Notes (Signed)
Occupational Therapy Session Note  Patient Details  Name: [name] [name] MRN: [id number] Date of Birth: Nov 05, 1955  Today's Date: 09/24/2019 OT Individual Time: 0700-0800 OT Individual Time Calculation (min): 60 min    Short Term Goals: Week 3:  OT Short Term Goal 1 (Week 3): Pt will recall hemi dressing techniques with no VC to don shirt with mod assist OT Short Term Goal 2 (Week 3): Pt will manage LUE prior to transfers to demo improved L attention OT Short Term Goal 3 (Week 3): Pt will complete toilet transfer with Mod assist OT Short Term Goal 4 (Week 3): Pt will complete LB dressing with mod assist  Skilled Therapeutic Interventions/Progress Updates:    Treatment session with focus on functional transfers, dynamic standing balance, and awareness of LUE during self-care retraining.  Pt received upright in bed agreeable to shower.  Completed bed mobility with mod assist and mod multimodal cues for increased sequence to decrease burden of care.  Completed stand pivot transfer bed > w/c > shower seat with Mod assist and increased time to allow pt to advance LLE.  Increased lean noted in sitting in shower, requiring intermittent multimodal cues for sitting balance.  Engaged in dressing with increased focus on independence as pt expects to be able to complete dressing independently upon d/c.  Educated on figure 4 position with therapist stabilizing leg in position to allow pt to thread Lt pant leg with assist for backward chaining.  Min assist sit > stand and to maintain standing, pt able to pull Rt side of pants over hips but required assistance to pull pants over Lt hip.  Pt able to don shirt this session with min assist and setup to start with LUE.  Therapist donned socks, shoes, and Lt AFO.  Pt remained upright in w/c with seat belt alarm on and all needs in reach.  Therapy Documentation Precautions:  Precautions Precautions: Fall Precaution Comments: L hemiparesis Restrictions Weight  Bearing Restrictions: No Pain:  Pt with no c/o pain   Therapy/Group: Individual Therapy  Simonne Come 09/24/2019, 12:16 PM

## 2019-09-24 NOTE — Progress Notes (Signed)
Occupational Therapy Session Note  Patient Details  Name: [name] [name] MRN: [id number] Date of Birth: 06-12-1956  Today's Date: 09/24/2019 OT Individual Time: 0830-0900 OT Individual Time Calculation (min): 30 min    Skilled Therapeutic Interventions/Progress Updates:    1:1 Pt up and in w/c. Pt reports wanting to workout in the dayroom on the "bike," pointing to the kinetron. Focus on activity tolerance, symmetrical movement to assist with transfers and dynamic standing. Pt perform reps while sitting in the w/c with minimal resistance with full upright posture without back support to engage core/ abs. Pt then transferred to seat on Kinetron and performed in sitting with focus on maintaining left hip at natural to prevent hip adduction and core control. Progressed to standing with more assistance to help maintain upright posture and guidance for weight shifts and left hip and knee extension. Pt transferred back to w/c with min A and left sitting up in the w/c.   Therapy Documentation Precautions:  Precautions Precautions: Fall Precaution Comments: L hemiparesis Restrictions Weight Bearing Restrictions: No Pain:  no c/o pain in session but reports hunger- pt currently NPO in prep for procedure.   Therapy/Group: Individual Therapy  Willeen Cass [facility] 09/24/2019, 11:56 AM

## 2019-09-24 NOTE — Progress Notes (Signed)
Speech Language Pathology Daily Session Note  Patient Details  Name: [name] [name] MRN: [id number] Date of Birth: 1955-11-15  Today's Date: 09/24/2019 SLP Individual Time: 0920-0950 SLP Individual Time Calculation (min): 30 min  Short Term Goals: Week 4: SLP Short Term Goal 1 (Week 4): Pt will complete basic familiar problem solving tasks with Max A cues. SLP Short Term Goal 2 (Week 4): Pt will sustain attention to basic task for 5 minutes with Max A cues. SLP Short Term Goal 3 (Week 4): Pt will recall important medical facts related to self with Max  A cues. SLP Short Term Goal 4 (Week 4): Pt will utilize memory strategies to recall daily information with Max A cues.  Skilled Therapeutic Interventions:  Skilled treatment session targeted cognition goals. SLP facilitated session by providing Mod A verbal cues to maintain arousal/attention to task. Pt with increased arousal when discussing NPO status (he is hungry d/t pending procedure) or when discussing the [employer]. Pt required Max A cues to gain awareness of current situation/ circumstance. Pt left upright in wheelchair with all needs within reach. Continue per current plan of care.      Pain    Therapy/Group: Individual Therapy  Jacklin Zwick 09/24/2019, 10:38 AM

## 2019-09-24 NOTE — Progress Notes (Signed)
NAME:  [name] [name], MRN:  [id number], DOB:  1956/06/26, LOS: 21 ADMISSION DATE:  09/03/2019, CONSULTATION DATE:  09/20/2019 REFERRING GY:KZLDJ Posey Pronto  , CHIEF COMPLAINT:  Needs  Bronch and BAL as inpatient  Brief History   [name] [name] is a 64 y.o. 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 He has had cough, fever, and opacity per  RML with inability to produce sputum specimen since admission. ID recommends bronch with BAL to rule out atypical infection prior to resuming chemo. PCCM have been asked to evaluate for inpatient bronch/ BAL and sputum evaluation.  History of present illness   [name] [name] is a 64 y.o. 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  He has had chills for a month and has had low grade fevers 100-101 F with leukopenia throughout current  admission.He has had 1 month history of night sweats and endorses  chills consistently since November/December. He also describes weight loss but uncertain as to how much (in the hospital 3 weeks now).  CXR/ CT Chest  shows right medial lower  lobe opacity, and right middle lobe inferior opacity. Pt. has previously had similar findings per left lung that self resolved. He has not had antibiotic treatment as there is oncern that his fevers/leucopenia are due to lymphoproliferative process The patient is unable to produce a sputum specimen. ID recommends that with cough history, infiltrate on imaging  and upcoming chemotherapy, that patient have a bronch with BAL to ensure no atypical infection prior to   resuming  chemo treatments.PCCM have been consulted for inpatient bronch and BAL and sputum evaluation.   Past Medical History   Past Medical History:  Diagnosis Date  . Allergic rhinitis, cause unspecified   . Backache, unspecified   . Body mass index 33.0-33.9, adult   . Diabetes mellitus without complication ([place])    Type II  . Elevated blood pressure reading without diagnosis of hypertension   . Esophageal reflux   . Generalized pain   . Heartburn   . Insomnia, unspecified   . 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 Other abnormal blood chemistry   . Other and unspecified hyperlipidemia   . Other dyspnea and respiratory abnormality   . Other nonspecific findings on examination of blood(790.99)   . Pain in joint, lower leg   . Pneumonia    1968  . Sleep apnea    Does not use or own a CPAP  . Tobacco use disorder   . Unspecified essential hypertension     Significant Hospital Events   09/03/2019>> Admission with   Consults:  09/16/2019>> ID 09/20/2019>>PCCM  Procedures:    Significant Diagnostic Tests:  09/10/2019 CXR Stable opacity in the medial aspect of the right lung base similar to that seen on prior exam.   08/28/2019 CT Chest>> Improving lymphadenopathy in the chest, abdomen, and pelvis. Dominant axillary nodes measure up to 10 mm, previously 2.2 cm. Dominant right common iliac node measures 1.8 cm, previously 4.0 cm.  Prior left upper lobe/suprahilar nodular opacity has resolved. New patchy/nodular opacities in the right  middle and lower lobes. While lymphomatous involvement is possible, the appearance favors Infection/inflammation.  Micro Data:  1/18 >>Blood: No Growth Final 1/18>> Urine: No Growth Final  Antimicrobials:  None   Interim history/subjective:  No events overnight.  Breathing well, working with PT.  Objective   Blood pressure 118/75, pulse 66, temperature 98.4 F (36.9 C), temperature source  Oral, resp. rate 17, height _0  (1.702 m), weight 73.8 kg, SpO2 98 %.        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  Unable to cough up sputum for micro to R/O atypical infection prior to resuming chemo   - Bronch today with BAL/brushings of RML and RLL - Discussed risks/benefits with patient at time of evaluation, he agrees to proceed - Further recs pending results of [place] to resume diet  Erskine Emery MD PCCM

## 2019-09-24 NOTE — Anesthesia Procedure Notes (Signed)
Procedure Name: LMA Insertion Date/Time: 09/24/2019 1:19 PM Performed by: Orlie Dakin, CRNA Pre-anesthesia Checklist: Patient identified, Emergency Drugs available, Suction available and Patient being monitored Patient Re-evaluated:Patient Re-evaluated prior to induction Oxygen Delivery Method: Circle system utilized Preoxygenation: Pre-oxygenation with 100% oxygen Ventilation: Mask ventilation without difficulty LMA: LMA inserted LMA Size: 5.0 Tube type: Oral Number of attempts: 1 Placement Confirmation: positive ETCO2 Tube secured with: Tape Dental Injury: Teeth and Oropharynx as per pre-operative assessment

## 2019-09-24 NOTE — Anesthesia Preprocedure Evaluation (Addendum)
Anesthesia Evaluation  Patient identified by MRN, date of birth, ID band Patient awake    Reviewed: Allergy & Precautions, NPO status , Patient's Chart, lab work & pertinent test results  History of Anesthesia Complications Negative for: history of anesthetic complications  Airway Mallampati: II  TM Distance: >3 FB Neck ROM: Full    Dental no notable dental hx. (+) Dental Advisory Given   Pulmonary sleep apnea , pneumonia, former smoker,    Pulmonary exam normal breath sounds clear to auscultation       Cardiovascular hypertension, Pt. on medications Normal cardiovascular exam Rhythm:Regular Rate:Normal     Neuro/Psych PSYCHIATRIC DISORDERS Depression CVA    GI/Hepatic Neg liver ROS, GERD  Medicated,  Endo/Other  diabetes, Type 2  Renal/GU negative Renal ROS  negative genitourinary   Musculoskeletal negative musculoskeletal ROS (+)   Abdominal (+) + obese,   Peds  Hematology  (+) anemia , CLL/small B-cell lymphoma, Hgb 9.5   Anesthesia Other Findings Day of surgery medications reviewed with patient.  Reproductive/Obstetrics                                                             Anesthesia Evaluation  Patient identified by MRN, date of birth, ID band Patient awake    Reviewed: Allergy & Precautions, NPO status , Patient's Chart, lab work & pertinent test results  Airway Mallampati: III  TM Distance: >3 FB Neck ROM: Full    Dental  (+) Teeth Intact, Dental Advisory Given   Pulmonary sleep apnea , former smoker,    Pulmonary exam normal breath sounds clear to auscultation       Cardiovascular hypertension, Pt. on medications Normal cardiovascular exam Rhythm:Regular Rate:Normal     Neuro/Psych negative neurological ROS     GI/Hepatic Neg liver ROS, GERD  Medicated,  Endo/Other  diabetes, Type 2, Oral Hypoglycemic Agents  Renal/GU negative Renal ROS     Musculoskeletal negative musculoskeletal ROS (+)   Abdominal   Peds  Hematology  (+) Blood dyscrasia, anemia ,   Anesthesia Other Findings Day of surgery medications reviewed with the patient.  Nasal turbinate hypertrophy, Nasal obstruction and Deviated septum surgery  Reproductive/Obstetrics                            Anesthesia Physical Anesthesia Plan  ASA: II  Anesthesia Plan: General   Post-op Pain Management:    Induction: Intravenous  PONV Risk Score and Plan: 4 or greater and Midazolam, Dexamethasone, Ondansetron and Diphenhydramine  Airway Management Planned: Oral ETT  Additional Equipment:   Intra-op Plan:   Post-operative Plan: Extubation in OR  Informed Consent: I have reviewed the patients History and Physical, chart, labs and discussed the procedure including the risks, benefits and alternatives for the proposed anesthesia with the patient or authorized representative who has indicated his/her understanding and acceptance.   Dental advisory given  Plan Discussed with: CRNA  Anesthesia Plan Comments:         Anesthesia Quick Evaluation  Anesthesia Physical Anesthesia Plan  ASA: III  Anesthesia Plan: General   Post-op Pain Management:    Induction: Intravenous  PONV Risk Score and Plan: 2 and Propofol infusion, Treatment may vary due to age or medical condition and Ondansetron  Airway Management Planned: Oral ETT  Additional Equipment: None  Intra-op Plan:   Post-operative Plan: Extubation in OR  Informed Consent: I have reviewed the patients History and Physical, chart, labs and discussed the procedure including the risks, benefits and alternatives for the proposed anesthesia with the patient or authorized representative who has indicated his/her understanding and acceptance.     Dental advisory given  Plan Discussed with: CRNA  Anesthesia Plan Comments:        Anesthesia Quick Evaluation

## 2019-09-24 NOTE — Op Note (Signed)
Bronchoscopy  Indication: Abnormal CT  Consent: Signed and in chart  Anesthesia: General  Procedure - Timeout performed - Bronchoscope advanced through LMA and above the vocal cords - Vocal cords anesthestized with 1% topical lidocaine - Bronchoscope advanced through vocal cords and into airway - Airways examined down to subsegmental level - Following airway examination, BAL and brushing x 2 in RML and BAL x brushing x 2 in RLL.  Findings - Small amount of mucopurulent secretions from RLL - Cloudy return on both BALs - No endobronchial lesions or signs of inflammation  Specimen(s):  - BAL RLL - BAL RML - Brushing x 2 RML: culture and cyto - Brushing x 2 RLL: culture and cyto   Complications: None immediate

## 2019-09-24 NOTE — Progress Notes (Signed)
Physical Therapy Session Note  Patient Details  Name: [name] [name] MRN: [id number] Date of Birth: 25-Sep-1955  Today's Date: 09/24/2019 PT Individual Time: [id number] PT Individual Time Calculation (min): 70 min    Short Term Goals: Week 4:  PT Short Term Goal 1 (Week 4): Pt will perform bed<>chair transfer with LRAD CGA PT Short Term Goal 2 (Week 4): Pt will ambulate 10ft with LRAD min A PT Short Term Goal 3 (Week 4): Pt will perform car transfer with LRAD CGA  Skilled Therapeutic Interventions/Progress Updates:    Pt supine in bed upon PT arrival, agreeable to therapy tx and denies pain. Pt transferred to sitting EOB with supervision and use of bedrail, cues to come to midline as pt leans to the R. Sitting EOB with supervision while therapist donned shoes and AFO total assist. Sit>stand from EOB with min assist, in standing min assist needed and cues to correct L lateral lean, mod assist stand pivot to w/c with RW secondary to L lateral lean, difficulty picking up L foot. Pt propelled w/c x 50 ft with min assist, cues for techniques, R hemi technique. Pt transferred to the mat stand pivot with RW and mod assist, poor R lateral weightshift while stepping with L LE, increased L lateral lean. Pt worked on sitting and standing balance this session with use of mirror for visual feedback and RW for UE support, in standing cues to limit L lean and return to midline, cues for R lateral weightshift and cues for increased L knee extension. Throguhout the session pt performed x 6 sit<>stands with RW and min assist, cues for L hand placement on orthosis and cues to use R UE to push up from mat. In standing, L knee flexion is limiting ability to maintain balance and causing increased L lean, pt performed x 15 R LE steps in place while therapist facilitated full L knee extension, pt able to maintain midline during this task, worked on L stance control and weightbearing. Pt also noted to have tight hamstrings,  performed seated L LE hamstring stretching 2 x 1 min. AFO doffed. Pt worked on static standing without AD and midline standing balance while focusing on L quad activation/knee extension and upright posture, facilitation/cues for L knee extension, facilitation for hip extension - x 2 trials. Dynamic standing balance without UE support while performing R UE reaching activity on R side to facilitate R lateral weightshift while pushing through/extending with L LE, tactile cues for L knee extension/quad activation. Pt performed stand pivot to the R without AD and min assist, cues for techniques. Pt transported back to the room and transferred to bed with min assist stand pivot to the R with use of bedrail. Sit>supine mod assist, left with needs in reach and bed alarm set.   Therapy Documentation Precautions:  Precautions Precautions: Fall Precaution Comments: L hemiparesis Restrictions Weight Bearing Restrictions: No    Therapy/Group: Individual Therapy  Netta Corrigan, PT, DPT, CSRS 09/24/2019, 8:31 AM

## 2019-09-24 NOTE — Progress Notes (Addendum)
[facility] PHYSICAL MEDICINE & REHABILITATION PROGRESS NOTE  Subjective/Complaints: Patient seen laying in bed this morning.  He states he slept well overnight.  He is working with therapies.  This morning, patient refused IV access, discussed with nursing.  Then discussed with patient who states that he is?  Upset that it was not performed the day before.  He states "always stay prepared" regarding plan for procedure today.  Attempted to educate patient.  ROS: Denies cough, CP, SOB, N/V/D  Objective: Vital Signs: Blood pressure 127/76, pulse 72, temperature 99.5 F (37.5 C), resp. rate 16, height _0  (1.702 m), weight 73.8 kg, SpO2 97 %. No results found. Recent Labs    09/24/19 0528  WBC 6.3  HGB 9.5*  HCT 28.8*  PLT 318   Recent Labs    09/23/19 0803  NA 133*  K 3.8  CL 98  CO2 23  GLUCOSE 168*  BUN 10  CREATININE 1.02  CALCIUM 9.7    Physical Exam: BP 127/76 (BP Location: Right Arm)   Pulse 72   Temp 99.5 F (37.5 C)   Resp 16   Ht _1  (1.702 m)   Wt 73.8 kg   SpO2 97%   BMI 25.48 kg/m   Constitutional: No distress . Vital signs reviewed. HENT: Normocephalic.  Atraumatic. Eyes: EOMI. No discharge. Cardiovascular: No JVD. Respiratory: Normal effort.  No stridor. GI: Non-distended. Skin: Warm and dry.  Intact. Psych: Normal mood.  Normal behavior. Musc: No edema in extremities.  No tenderness in extremities. Neuro: Alert Motor:  LUE: shoulder abduction 1+/5, 0/5 distally, stable LLE: Hip flexion, knee extension 2+/5, ankle dorsiflexion 0/5 No increase in tone noted   Assessment/Plan: 1. Functional deficits secondary to right paramedian pontine infarct which require 3+ hours per day of interdisciplinary therapy in a comprehensive inpatient rehab setting.  Physiatrist is providing close team supervision and 24 hour management of active medical problems listed below.  Physiatrist and rehab team continue to assess barriers to discharge/monitor  patient progress toward functional and medical goals  Care Tool:  Bathing    Body parts bathed by patient: Chest, Abdomen, Front perineal area, Right upper leg, Left upper leg, Face   Body parts bathed by helper: Right arm, Left arm, Buttocks, Right lower leg, Left lower leg     Bathing assist Assist Level: Moderate Assistance - Patient 50 - 74%     Upper Body Dressing/Undressing Upper body dressing   What is the patient wearing?: Pull over shirt    Upper body assist Assist Level: Minimal Assistance - Patient > 75%    Lower Body Dressing/Undressing Lower body dressing      What is the patient wearing?: Pants, Incontinence brief     Lower body assist Assist for lower body dressing: Moderate Assistance - Patient 50 - 74%     Toileting Toileting Toileting Activity did not occur (Clothing management and hygiene only): N/A (no void or bm)  Toileting assist Assist for toileting: Total Assistance - Patient < 25% Assistive Device Comment: urinal   Transfers Chair/bed transfer  Transfers assist  Chair/bed transfer activity did not occur: Safety/medical concerns  Chair/bed transfer assist level: Moderate Assistance - Patient 50 - 74%     Locomotion Ambulation   Ambulation assist      Assist level: Moderate Assistance - Patient 50 - 74% Assistive device: Walker-rolling Max distance: 71f   Walk 10 feet activity   Assist  Walk 10 feet activity did not occur: Safety/medical concerns(decreased strength/activity  tolerance)  Assist level: Moderate Assistance - Patient - 50 - 74% Assistive device: Walker-rolling, Orthosis   Walk 50 feet activity   Assist Walk 50 feet with 2 turns activity did not occur: Safety/medical concerns(decreased strength/activity tolerance)  Assist level: Moderate Assistance - Patient - 50 - 74% Assistive device: Walker-rolling, Orthosis    Walk 150 feet activity   Assist Walk 150 feet activity did not occur: Safety/medical  concerns(decreased strength/activity tolerance)         Walk 10 feet on uneven surface  activity   Assist Walk 10 feet on uneven surfaces activity did not occur: Safety/medical concerns(decreased strength/activity tolerance)         Wheelchair     Assist Will patient use wheelchair at discharge?: Yes Type of Wheelchair: Manual Wheelchair activity did not occur: Safety/medical concerns(Unable without skilled intervention, L hemi)  Wheelchair assist level: Supervision/Verbal cueing Max wheelchair distance: 179f    Wheelchair 50 feet with 2 turns activity    Assist    Wheelchair 50 feet with 2 turns activity did not occur: (Unable without skilled intervention, L hemi)   Assist Level: Supervision/Verbal cueing   Wheelchair 150 feet activity     Assist Wheelchair 150 feet activity did not occur: (Unable without skilled intervention, L hemi)   Assist Level: Supervision/Verbal cueing      Medical Problem List and Plan: 1.  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  Antithrombotics: -DVT/anticoagulation: Lovenox             -antiplatelet therapy: Plavix 75 mg daily patient placed in [employer] XI inhibitor stroke prevention trial 3. Pain Management: Tylenol as needed  Voltaren gel changed to muscle rub right shoulder after discussion with neurology 4. Mood: Provide emotional support  Ritalin started             -antipsychotic agents: N/A 5. Neuropsych: This patient is capable of making decisions on his own behalf. 6. Skin/Wound Care: Routine skin checks 7. Fluids/Electrolytes/Nutrition: Routine in and outs 8.  Chronic hyponatremia.  Low sodium in September through November 2020.    IVFs 100cc/hr d/ced  Na 133 on 2/1 9.  CLL/small B-cell lymphoma.  Follow-up Dr.Kale.  Patient last received chemotherapy October 2020. 10.  Type 2 diabetes  mellitus with hyperglycemia.  Hemoglobin A1c 5.9.  Glucophage currently held.  Continue sliding scale  Slightly elevated 2/2  Monitor with increased mobility 11.  Hyperlipidemia.  Lipitor 12.  History of hypertension.    On no medications at present  Controlled on 2/2  Monitor with increased mobility 13. Mild Spasticity:  Cont to monitor  Controlled at present without medication-continue range of motion 14. OSA: refuses to wear CPAP 15. Hypoalbuminemia  Supplement initiated on 1/13 16. Leukopenia  WBCs 6.3 on 2/2  See #9  Cont to monitor 17. Acute on chronic anemia  Hb 9.5 on 2/2  Continue to monitor 18. Fever:   Persistent intermittent low-grade/borderline fevers  UA negative, urine culture negative  Blood cultures no growth FINAL  Chest x-ray on 1/19 reviewed, showing stable opacity, prolonged discussion with Heme/Onc regarding lymphoma and opacity-suggesting central origin.  Appreciate ID recs, recommending BAL.  Bronchoscopy today per palm, appreciate recs, reviewed notes. 19.  Sleep disturbance  Ambien 10 d/ced due to lethargy  Overall improved  LOS: 21 days A FACE TO FACE EVALUATION WAS PERFORMED  [name] [name] ALorie Phenix2/09/2019, 8:28 AM

## 2019-09-24 NOTE — Progress Notes (Signed)
Responded to PIV consult. Explained to pt reason for visit (to obtain PIV access). Immediately upon puncturing skin surface, pt stated,"get that needle out". Again discussed need for PIV access; pt refused PIV at this time. RN notified.

## 2019-09-25 ENCOUNTER — Inpatient Hospital Stay (HOSPITAL_COMMUNITY): Payer: Medicare Other | Admitting: Occupational Therapy

## 2019-09-25 ENCOUNTER — Inpatient Hospital Stay (HOSPITAL_COMMUNITY): Payer: Medicare Other

## 2019-09-25 ENCOUNTER — Inpatient Hospital Stay (HOSPITAL_COMMUNITY): Payer: Medicare Other | Admitting: Speech Pathology

## 2019-09-25 ENCOUNTER — Ambulatory Visit (HOSPITAL_COMMUNITY): Payer: Medicare Other | Admitting: *Deleted

## 2019-09-25 DIAGNOSIS — C8330 Diffuse large B-cell lymphoma, unspecified site: Secondary | ICD-10-CM

## 2019-09-25 LAB — BASIC METABOLIC PANEL
Anion gap: 10 (ref 5–15)
BUN: 12 mg/dL (ref 8–23)
CO2: 25 mmol/L (ref 22–32)
Calcium: 9.9 mg/dL (ref 8.9–10.3)
Chloride: 99 mmol/L (ref 98–111)
Creatinine, Ser: 1.11 mg/dL (ref 0.61–1.24)
GFR calc Af Amer: >60 mL/min (ref >60)
GFR calc non Af Amer: >60 mL/min (ref >60)
Glucose, Bld: 121 mg/dL — ABNORMAL HIGH (ref 70–99)
Potassium: 4.1 mmol/L (ref 3.5–5.1)
Sodium: 134 mmol/L — ABNORMAL LOW (ref 135–145)

## 2019-09-25 LAB — CYTOLOGY - NON PAP

## 2019-09-25 LAB — CBC
HCT: 29.7 % — ABNORMAL LOW (ref 39.0–52.0)
Hemoglobin: 9.8 g/dL — ABNORMAL LOW (ref 13.0–17.0)
MCH: 23.7 pg — ABNORMAL LOW (ref 26.0–34.0)
MCHC: 33 g/dL (ref 30.0–36.0)
MCV: 71.9 fL — ABNORMAL LOW (ref 80.0–100.0)
Platelets: 338 10*3/uL (ref 150–400)
RBC: 4.13 MIL/uL — ABNORMAL LOW (ref 4.22–5.81)
RDW: 16.2 % — ABNORMAL HIGH (ref 11.5–15.5)
WBC: 6.9 10*3/uL (ref 4.0–10.5)
nRBC: 0 % (ref 0.0–0.2)

## 2019-09-25 LAB — ACID FAST SMEAR (AFB, MYCOBACTERIA)
Acid Fast Smear: NEGATIVE
Acid Fast Smear: NEGATIVE

## 2019-09-25 LAB — GLUCOSE, CAPILLARY: Glucose-Capillary: 233 mg/dL — ABNORMAL HIGH (ref 70–99)

## 2019-09-25 NOTE — Progress Notes (Signed)
Speech Language Pathology Daily Session Note  Patient Details  Name: [name] [name] MRN: [id number] Date of Birth: [birth year]  Today's Date: 09/25/2019 SLP Individual Time: 1300-1400 SLP Individual Time Calculation (min): 60 min  Short Term Goals: Week 4: SLP Short Term Goal 1 (Week 4): Pt will complete basic familiar problem solving tasks with Max A cues. SLP Short Term Goal 2 (Week 4): Pt will sustain attention to basic task for 5 minutes with Max A cues. SLP Short Term Goal 3 (Week 4): Pt will recall important medical facts related to self with Max  A cues. SLP Short Term Goal 4 (Week 4): Pt will utilize memory strategies to recall daily information with Max A cues.  Skilled Therapeutic Interventions:  Skilled treatment session targeted cognition deficits. SLP facilitated session by administering the [place] (d/t pt's vision deficits). Pt obtained score of 10 out of 22 (n=> 18). Pt with deficits in attention, memory, problem solving and has lengthy response times. Pt with accurate intellectual awareness/assessment of ability as he stated he felt he got "50% right." He was not to transfer this knowledge to emergent or anticipatory awareness. Pt had tangential thought content such as stating that 50% just "keeping your head above water." When asked how this may look when going home if he needs 50% help with tasks he commented he "has a rainy day stash that he has to use on days to keep himself above water." Despite Maximal verbal cues, pt unable to transfer thought of "50%" ability to home environment and need for help at discharge. Pt is resistant to help with bill paying, medication or money management d/t lack of insight into deficits. Pt required repetitive questions to provide appropriate answers (answers close to content of questions). When presented with questions such as "will you need your son to help you get into bed from your wheelchair? or will you need help getting from the wheelchair  to the toilet/shower/bath?" Pt responded "no" to all questions and despite Maximal question cues pt provided alternative rationales for each situation. Alternative rationale was not always cohertant. Pt upright in wheelchair and more alert this session. Pt also more verbal d/t increased alertness. His speech intelligibility is decreased d/t imprecise articulation, phonemic dysfluency and low vocal quality. Pt was ~ 60 to 75% intelligible at the sentence level.        Pain    Therapy/Group: Individual Therapy  Lakiesha Ralphs 09/25/2019, 3:01 PM

## 2019-09-25 NOTE — Progress Notes (Signed)
Awaiting bronch culture results, if anything pops up I will weigh in.  Please call if any questions or concerns.  Erskine Emery MD PCCM

## 2019-09-25 NOTE — Progress Notes (Signed)
Team Conference Report to Patient/Family  Team Conference discussion was reviewed with the patient and caregiver/Son and friend [name] [name], including goals, any changes in plan of care and target discharge date. The patient is currently at a overall Mod Assist level with goals for min A overall.  Patient and caregiver express understanding and are in agreement.  The patient has a target discharge date of 09/28/19. Family education set up with son [name] [name] on Friday @ 1300. Son asked questions about the results of the bronch procedure and treatment. Referred the son to the pulmonology physician for this information. [name] [name] noted she is coming into town and will be here Saturday afternoon to assist the patient for about 10 days.  [name] [name] 09/25/2019, 4:14 PM

## 2019-09-25 NOTE — Progress Notes (Signed)
NAME:  [name] [name], MRN:  [id number], DOB:  Nov 18, 1955, LOS: 88 ADMISSION DATE:  09/03/2019, CONSULTATION DATE:  09/20/2019 REFERRING IO:XBDZH Posey Pronto  , CHIEF COMPLAINT:  Needs  Bronch and BAL as inpatient  Brief History   [name] [name] is a 64 y.o. 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 He has had cough, fever, and opacity per  RML with inability to produce sputum specimen since admission. ID recommends bronch with BAL to rule out atypical infection prior to resuming chemo. PCCM have been asked to evaluate for inpatient bronch/ BAL and sputum evaluation.  History of present illness   [name] [name] is a 64 y.o. 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  He has had chills for a month and has had low grade fevers 100-101 F with leukopenia throughout current  admission.He has had 1 month history of night sweats and endorses  chills consistently since November/December. He also describes weight loss but uncertain as to how much (in the hospital 3 weeks now).  CXR/ CT Chest  shows right medial lower  lobe opacity, and right middle lobe inferior opacity. Pt. has previously had similar findings per left lung that self resolved. He has not had antibiotic treatment as there is oncern that his fevers/leucopenia are due to lymphoproliferative process The patient is unable to produce a sputum specimen. ID recommends that with cough history, infiltrate on imaging  and upcoming chemotherapy, that patient have a bronch with BAL to ensure no atypical infection prior to   resuming  chemo treatments.PCCM have been consulted for inpatient bronch and BAL and sputum evaluation.   Past Medical History   Past Medical History:  Diagnosis Date   Allergic rhinitis, cause unspecified    Backache, unspecified    Body mass index 33.0-33.9, adult    Diabetes mellitus without complication (HCC)    Type II   Elevated blood pressure reading without diagnosis of hypertension    Esophageal reflux    Generalized pain    Heartburn    Insomnia, unspecified    Nonspecific reaction to tuberculin skin test without active tuberculosis(795.51)    Other abnormal blood chemistry    Other and unspecified hyperlipidemia    Other dyspnea and respiratory abnormality    Other nonspecific findings on examination of blood(790.99)    Pain in joint, lower leg    Pneumonia    1968   Sleep apnea    Does not use or own a CPAP   Tobacco use disorder    Unspecified essential hypertension     Significant Hospital Events   09/03/2019>> Admission with   Consults:  09/16/2019>> ID 09/20/2019>>PCCM  Procedures:    Significant Diagnostic Tests:  09/10/2019 CXR Stable opacity in the medial aspect of the right lung base similar to that seen on prior exam.   08/28/2019 CT Chest>> Improving lymphadenopathy in the chest, abdomen, and pelvis. Dominant axillary nodes measure up to 10 mm, previously 2.2 cm. Dominant right common iliac node measures 1.8 cm, previously 4.0 cm.  Prior left upper lobe/suprahilar nodular opacity has resolved. New patchy/nodular opacities in the right  middle and lower lobes. While lymphomatous involvement is possible, the appearance favors Infection/inflammation.  Micro Data:  1/18 >>Blood: No Growth Final 1/18>> Urine: No Growth Final  Antimicrobials:  None   Interim history/subjective:  No acute issues following fiberoptic bronchoscopy await results from brushings  Objective   Blood pressure 117/67, pulse (!) 59, temperature 98.3 F (36.8  C), resp. rate 18, height _0  (1.702 m), weight 73.8 kg, SpO2 98 %.        Intake/Output Summary (Last 24 hours) at 09/25/2019 0853 Last data filed at 09/25/2019 0730 Gross per 24 hour  Intake 420 ml  Output 1251 ml  Net -831 ml   Filed Weights   09/03/19 1511 09/04/19 0414 09/24/19 1233  Weight: 77.8 kg 73.8 kg 73.8 kg    Examination: General: 64 year old male in no acute distress HEENT: MM pink/moist no JVD is appreciated Neuro: Dense left hemiplegia CV: Heart sounds are distant PULM: Diminished in the bases  GI: soft, bsx4 active  GU: Voids Extremities: warm/dry,  edema  Skin: no rashes or lesions    Assessment & Plan:  # Abnormal CT of chest Abnormalities in RML, RLL Cough with chronic low grade  fevers and Leukopenia 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  Unable to cough up sputum for micro to R/O atypical infection prior to resuming chemo  Underwent fiberoptic bronchoscopy on 09/24/2019 without issues.  Follow-up BAL brushings from right middle lobe and right lower lobe Pulmonary critical care available as needed  Tom Bean Acute Care Nurse Practitioner [place] Please consult [place] 09/25/2019, 8:54 AM

## 2019-09-25 NOTE — Progress Notes (Signed)
[facility] PHYSICAL MEDICINE & REHABILITATION PROGRESS NOTE  Subjective/Complaints: Patient seen sitting up in bed this AM.  He states he slept well overnight.  He wants to know the result of his bronch yesterday.  Informed patients results could take days.  He also states he has limited assistance at discharge.   ROS: Denies cough, CP, SOB, N/V/D  Objective: Vital Signs: Blood pressure 117/67, pulse (!) 59, temperature 98.3 F (36.8 C), resp. rate 18, height _0  (1.702 m), weight 73.8 kg, SpO2 98 %. No results found. Recent Labs    09/24/19 0528  WBC 6.3  HGB 9.5*  HCT 28.8*  PLT 318   Recent Labs    09/23/19 0803  NA 133*  K 3.8  CL 98  CO2 23  GLUCOSE 168*  BUN 10  CREATININE 1.02  CALCIUM 9.7    Physical Exam: BP 117/67 (BP Location: Right Arm)   Pulse (!) 59   Temp 98.3 F (36.8 C)   Resp 18   Ht _1  (1.702 m)   Wt 73.8 kg   SpO2 98%   BMI 25.48 kg/m   Constitutional: No distress . Vital signs reviewed. HENT: Normocephalic.  Atraumatic. Eyes: EOMI. No discharge. Cardiovascular: No JVD. Respiratory: Normal effort.  No stridor. GI: Non-distended. Skin: Warm and dry.  Intact. Psych: Normal mood.  Normal behavior. Musc: No edema in extremities.  No tenderness in extremities. Neuro: Alert Motor:  LUE: shoulder abduction 1+/5, 0/5 distally, unchanged. LLE: Hip flexion, knee extension 2+/5, ankle dorsiflexion 0/5, unchanged. No increase in tone noted   Assessment/Plan: 1. Functional deficits secondary to right paramedian pontine infarct which require 3+ hours per day of interdisciplinary therapy in a comprehensive inpatient rehab setting.  Physiatrist is providing close team supervision and 24 hour management of active medical problems listed below.  Physiatrist and rehab team continue to assess barriers to discharge/monitor patient progress toward functional and medical goals  Care Tool:  Bathing    Body parts bathed by patient: Chest,  Abdomen, Front perineal area, Right upper leg, Left upper leg, Face   Body parts bathed by helper: Right arm, Left arm, Buttocks, Right lower leg, Left lower leg     Bathing assist Assist Level: Moderate Assistance - Patient 50 - 74%     Upper Body Dressing/Undressing Upper body dressing   What is the patient wearing?: Pull over shirt    Upper body assist Assist Level: Minimal Assistance - Patient > 75%    Lower Body Dressing/Undressing Lower body dressing      What is the patient wearing?: Pants, Incontinence brief     Lower body assist Assist for lower body dressing: Moderate Assistance - Patient 50 - 74%     Toileting Toileting Toileting Activity did not occur (Clothing management and hygiene only): N/A (no void or bm)  Toileting assist Assist for toileting: Total Assistance - Patient < 25% Assistive Device Comment: urinal   Transfers Chair/bed transfer  Transfers assist  Chair/bed transfer activity did not occur: Safety/medical concerns  Chair/bed transfer assist level: Moderate Assistance - Patient 50 - 74%     Locomotion Ambulation   Ambulation assist      Assist level: Moderate Assistance - Patient 50 - 74% Assistive device: Walker-rolling Max distance: 56f   Walk 10 feet activity   Assist  Walk 10 feet activity did not occur: Safety/medical concerns(decreased strength/activity tolerance)  Assist level: Moderate Assistance - Patient - 50 - 74% Assistive device: Walker-rolling, Orthosis   Walk  50 feet activity   Assist Walk 50 feet with 2 turns activity did not occur: Safety/medical concerns(decreased strength/activity tolerance)  Assist level: Moderate Assistance - Patient - 50 - 74% Assistive device: Walker-rolling, Orthosis    Walk 150 feet activity   Assist Walk 150 feet activity did not occur: Safety/medical concerns(decreased strength/activity tolerance)         Walk 10 feet on uneven surface  activity   Assist Walk 10  feet on uneven surfaces activity did not occur: Safety/medical concerns(decreased strength/activity tolerance)         Wheelchair     Assist Will patient use wheelchair at discharge?: Yes Type of Wheelchair: Manual Wheelchair activity did not occur: Safety/medical concerns(Unable without skilled intervention, L hemi)  Wheelchair assist level: Supervision/Verbal cueing Max wheelchair distance: 149f    Wheelchair 50 feet with 2 turns activity    Assist    Wheelchair 50 feet with 2 turns activity did not occur: (Unable without skilled intervention, L hemi)   Assist Level: Supervision/Verbal cueing   Wheelchair 150 feet activity     Assist Wheelchair 150 feet activity did not occur: (Unable without skilled intervention, L hemi)   Assist Level: Supervision/Verbal cueing      Medical Problem List and Plan: 1.  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  2.  Antithrombotics: -DVT/anticoagulation: Lovenox             -antiplatelet therapy: Plavix 75 mg daily patient placed in [employer] XI inhibitor stroke prevention trial 3. Pain Management: Tylenol as needed  Voltaren gel changed to muscle rub right shoulder after discussion with neurology 4. Mood: Provide emotional support  Ritalin started, will monitor for improvement             -antipsychotic agents: N/A 5. Neuropsych: This patient is capable of making decisions on his own behalf. 6. Skin/Wound Care: Routine skin checks 7. Fluids/Electrolytes/Nutrition: Routine in and outs 8.  Chronic hyponatremia.  Low sodium in September through November 2020.    IVFs 100cc/hr d/ced  Na 133 on 2/1, labs ordered for tomorrow 9.  CLL/small  B-cell lymphoma.  Follow-up Dr.Kale.  Patient last received chemotherapy October 2020. 10.  Type 2 diabetes mellitus with hyperglycemia.  Hemoglobin A1c 5.9.  Glucophage currently held.  Continue sliding scale  Very labile on 2/3  Monitor with increased mobility 11.  Hyperlipidemia.  Lipitor 12.  History of hypertension.    On no medications at present  Controlled on 2/2  Monitor with increased mobility 13. Mild Spasticity:  Cont to monitor  Controlled at present without medication-continue range of motion 14. OSA: refuses to wear CPAP 15. Hypoalbuminemia  Supplement initiated on 1/13 16. Leukopenia  WBCs 6.3 on 2/2, labs ordered for tomorrow  See #9  Cont to monitor 17. Acute on chronic anemia  Hb 9.5 on 2/2  Continue to monitor 18. Fever:   Persistent intermittent low-grade/borderline fevers  UA negative, urine culture negative  Blood cultures no growth FINAL  Chest x-ray on 1/19 reviewed, showing stable opacity, prolonged discussion with Heme/Onc regarding lymphoma and opacity-suggesting central origin.  Appreciate ID recs, recommending BAL.  Bronchoscopy performed on 2/3, path pending 19.  Sleep disturbance  Ambien 10 d/ced due to lethargy  Overall improved  LOS: 22  days A FACE TO FACE EVALUATION WAS PERFORMED  Tahari Clabaugh Lorie Phenix 09/25/2019, 9:10 AM

## 2019-09-25 NOTE — Progress Notes (Signed)
Physical Therapy Session Note  Patient Details  Name: [name] [name] MRN: [id number] Date of Birth: 02-28-1956  Today's Date: 09/25/2019 PT Individual Time: 0800-0900 1400-1430 PT Individual Time Calculation (min): 60 min and 30 min  Short Term Goals: Week 3:  PT Short Term Goal 1 (Week 3): Pt will perform bed<>chair transfer with LRAD CGA PT Short Term Goal 1 - Progress (Week 3): Progressing toward goal PT Short Term Goal 2 (Week 3): Pt will ambulate 73ft with LRAD CGA PT Short Term Goal 2 - Progress (Week 3): Progressing toward goal PT Short Term Goal 3 (Week 3): Pt will perform car transfer with LRAD CGA PT Short Term Goal 3 - Progress (Week 3): Progressing toward goal Week 4:  PT Short Term Goal 1 (Week 4): Pt will perform bed<>chair transfer with LRAD CGA PT Short Term Goal 2 (Week 4): Pt will ambulate 14ft with LRAD min A PT Short Term Goal 3 (Week 4): Pt will perform car transfer with LRAD CGA  Skilled Therapeutic Interventions/Progress Updates:   Treatment Session 1: 0800-0900 60 min Received pt supine in bed, pt agreeable to therapy, and denied any pain throughout session. Session focused on functional mobility/transfers, dressing, LE strength, NMR, dynamic standing balance/coordination, and improved tolerance to activity. Pt stated that syrup from breakfast was all over tray and body; therapist assisted with cleaning up. Pt reported urge to urinate and able to use urinal with supervision and extensive time to undo brief and place urinal. Pt falling asleep while using urinal requiring min cues to awaken. Pt performed bed mobility with supervision with HOB elevated and use of bedrail. Pt donned pants sitting EOB with max A and transferred sit<>stand with RW min A to pull pants over hips with max A. Pt with L lateral lean requiring cues to correct. Pt with syrup all over body from breaksfast, therapist assisted cleaning patient total assist. Pt donned pull over shirt sitting EOB with max A.  Therapist donned shoes and L AFO total assist. Pt attempted stand<>pivot bed<>WC to L without AD, however unable to shift weight to flex L hip; returned to bed. Pt attempted stand<>pivot to R with RW, and able to complete transfer mod A. Pt required multiple rest breaks throughout session and was encouraged to actively participate as much as possible (pt continues to require extensive time when dressing). Pt transported to dayroom in [facility] for time management purposes. Pt transferred sit<>stand min A and stood at table working on reaching with RUE for horseshoes on L and stacking them on the with therapist providing L UE weightbearing through table to emphasize weight shifting to R CGA/min A. Pt transported back to room in [facility] total assist. Concluded session with pt sitting in WC, needs within reach, and seatbelt alarm on. Therapist provided drink for patient.   Treatment Session 2: [id number] 30 min Received pt sitting in WC, pt agreeable to therapy, and denied any pain throughout session. Session focused on functional mobility/transfers, gait training, LE strength, NMR, dynamic standing balance/coordination, and improved tolerance to activity. Recreational therapy present during session. Pt transported to gym in [facility] for time management purposes. Pt transferred stand<>pivot WC<>mat to R without AD max A. Pt with poor motor planning and turning sequence. Pt transferred sit<>stand with RW min A. Pt requested to ambulate. Pt ambulated 63ft x 2 trials with RW mod A +2 for WC follow. Trial 1: without AFO, Trial 2: with L PLS AFO. Pt required verbal cues for increased LLE hip flexion, LLE knee  extension, and increased R LE step through. Pt required multiple rest breaks throughout session. Concluded session with pt sitting in WC, needs within reach, and seatbelt alarm on. Therapist provided drink for patient.   Therapy Documentation Precautions:  Precautions Precautions: Fall Precaution Comments: L  hemiparesis Restrictions Weight Bearing Restrictions: No   Therapy/Group: Individual Therapy Alfonse Alpers PT, DPT   09/25/2019, 7:37 AM

## 2019-09-25 NOTE — Plan of Care (Signed)
  Problem: RH Ambulation Goal: LTG Patient will ambulate in controlled environment (PT) Description: LTG: Patient will ambulate in a controlled environment, # of feet with assistance (PT). Outcome: Not Applicable Flowsheets (Taken 09/25/2019 1024) LTG: Pt will ambulate in controlled environ  assist needed:: (D/C) -- Note: D/C Goal: LTG Patient will ambulate in home environment (PT) Description: LTG: Patient will ambulate in home environment, # of feet with assistance (PT). Outcome: Not Applicable Flowsheets (Taken 09/25/2019 1024) LTG: Pt will ambulate in home environ  assist needed:: (D/C) -- Note: D/C

## 2019-09-25 NOTE — Patient Care Conference (Signed)
Inpatient RehabilitationTeam Conference and Plan of Care Update Date: 09/25/2019   Time: 11:30 AM    Patient Name: [name] [name]      Medical Record Number: [id number]  Date of Birth: Mar 22, 1956 Sex: Male         Room/Bed: 4W07C/4W07C-01 Payor Info: Payor: [employer] MEDICARE / Plan: [facility] MEDICARE / Product Type: *No Product type* /    Admit Date/Time:  09/03/2019  3:09 PM  Primary Diagnosis:  Right pontine CVA [facility])  Patient Active Problem List   Diagnosis Date Noted  . 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 Sleep disturbance   . History of hypertension   . Opacity of lung on imaging study   . Fever   . Reactive depression   . Benign essential HTN   . Labile blood glucose   . Acute on chronic anemia   . Lymphopenia   . Hypoalbuminemia due to protein-calorie malnutrition ([place])   . Controlled type 2 diabetes mellitus with hyperglycemia, without long-term current use of insulin ([place])   . Right pontine CVA ([place]) 09/03/2019  . Acute ischemic stroke ([place]) 08/29/2019  . Hyponatremia 08/29/2019  . HTN (hypertension) 08/29/2019  . DM2 (diabetes mellitus, type 2) ([place]) 08/29/2019  . CLL (chronic lymphocytic leukemia) ([place]) 04/10/2019  . Counseling regarding advance care planning and goals of care 04/10/2019  . Deviated septum 05/18/2018  . Iron deficiency anemia 02/26/2018    Expected Discharge Date: Expected Discharge Date: 09/28/19  Team Members Present: Physician leading conference: Dr. Delice Lesch Social Worker Present: Lennart Pall, LCSW(Auria Barbra Sarks, LCSW) Nurse Present: Dorien Chihuahua, RN; Blair Heys, RN Case manager: Karene Fry, RN PT Present: Becky Sax, PT OT Present: Simonne Come, OT SLP Present: Stormy Fabian, SLP PPS Coordinator present : Ileana Ladd, Burna Mortimer, SLP     Current Status/Progress Goal Weekly Team Focus  Bowel/Bladder   continent with episodes of incontiences; LBM: 02/02 per pt  time toilet while awake; continue PRN laxatives prn   Assist with toileting needs prn   Swallow/Nutrition/ Hydration             ADL's   improvements noted in arousal and mobility.  Currently mod assist for bathing, LB dressing, transfers, Min assist for UB dressing, total assist for donning shoes.  Continues to lean Lt but able to correct with cues  downgraded to min assist overall  ADL retraining, LUE NMR, trunk control, transfers, dynamic sitting and standing balance, pt/family education   Mobility   bed mobility supervision, transfers mod A, WC mobility 23ft, no ambulation recently  min A  functional mobility/transfers, LE strength, posturla control/alignment, dynamic sitting/standing balance, imporved endurance   Communication             Safety/Cognition/ Behavioral Observations  Max A for sustained attention, basic problem solving, awareness  Max A  basic problem solving, recall and sustained attention   Pain   Denies pain  Remain pain free  Assess pain QS and prn   Skin   Skin intact  Maintain skin integrity  Assess skin QS and prn    Rehab Goals Patient on target to meet rehab goals: Yes Rehab Goals Revised: Downgraded goals 2/2 medical issues/fatigue and son is aware of changes and able to provide care with patient's friend *See Care Plan and progress notes for long and short-term goals.     Barriers to Discharge  Current Status/Progress Possible Resolutions Date Resolved   Nursing  PT  Medical stability                 OT                  SLP Medical stability              SW Decreased caregiver support;Lack of/limited family support Son works 6p-6a, mother at patient's home has aide assistance a couple of hours a day Set to discharge home with son, ramped entrance to home; girlfriend coming in to assist          Discharge Planning/Teaching Needs:  Discharging home with son who works 6p-6a; girlfriend coming to assist as well  Transfers, medications, DM monitoring, B+D assistance   Team  Discussion: Pulmonary involved, bronch yesterday, await results, started on ritalin.  RN quiet, slow, cont B/B, double vision.  OT more awake/alert, mod A transfers, bathing min/mod, dressing mod a, cues for sequencing, need to do fam ed, min A goals.  PT min/S bed, transfers mod A, min a goals.  Needs a new AFO, needs fam ed.  SLP max prob solving, goals max A, memory impaired.  Son/GF to assist, schedule for fam ed Fri am.   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 Weekly Focus/Goal: Improve mobility, leucopenia, fevers, lethargy  Barriers to Discharge: Medical stability;Other (comments)  Barriers to Discharge Comments: Intermittent fevers Possible Resolutions to Barriers: Therapies, follow labs, follow up with results of Bronch, cont neurostimulant   Continued Need for Acute Rehabilitation Level of Care: The patient requires daily medical management by a physician with specialized training in physical medicine and rehabilitation for the following reasons: Direction of a multidisciplinary physical rehabilitation program to maximize functional independence : Yes Medical management of patient stability for increased activity during participation in an intensive rehabilitation regime.: Yes Analysis of laboratory values and/or radiology reports with any subsequent need for medication adjustment and/or medical intervention. : Yes   I attest that I was present, lead the team conference, and concur with the assessment and plan of the team.   Retta Diones 09/25/2019, 9:36 PM   Team conference was held via web/ teleconference due to [place] - 19

## 2019-09-25 NOTE — Progress Notes (Signed)
Occupational Therapy Weekly Progress Note  Patient Details  Name: [name] [name] MRN: [id number] Date of Birth: 1955/12/21  Beginning of progress report period: September 18, 2019 End of progress report period: August 25, 2019  Today's Date: 09/25/2019 OT Individual Time: 4098-1191 OT Individual Time Calculation (min): 55 min    Patient has met 4 of 4 short term goals.  Pt is making steady progress towards goals. Pt had had a decline about 7-10 days ago impacting pt participation and progress towards goals. However in last day or 2 pt has demonstrated increased arousal and ability to engage in self-care and other therapeutic activities.  Pt currently completes stand pivot transfers with mod assist, and bathing and dressing at overall mod assist level.  Pt continues to require increased time and cues for sequencing and problem solving during dressing tasks.  [name] has not been present for hands on education to this point and will benefit from education as pt will requires hands on assistance upon d/c home.    Patient continues to demonstrate the following deficits:  muscle weakness,decreased cardiorespiratoy endurance,unbalanced muscle activation, motor apraxia, decreased coordination and decreased motor planning,decreased visual perceptual skills and decreased visual motor skills,decreased attention to left,decreased problem solving, decreased safety awareness and decreased memoryand decreased sitting balance, decreased standing balance, decreased postural control, hemiplegia and decreased balance strategies and therefore will continue to benefit from skilled OT intervention to enhance overall performance with BADL and Reduce care partner burden.  Patient progressing toward long term goals..  Continue plan of care.  OT Short Term Goals Week 3:  OT Short Term Goal 1 (Week 3): Pt will recall hemi dressing techniques with no VC to don shirt with mod assist OT Short Term Goal 1 - Progress (Week  3): Met OT Short Term Goal 2 (Week 3): Pt will manage LUE prior to transfers to demo improved L attention OT Short Term Goal 2 - Progress (Week 3): Met OT Short Term Goal 3 (Week 3): Pt will complete toilet transfer with Mod assist OT Short Term Goal 3 - Progress (Week 3): Met OT Short Term Goal 4 (Week 3): Pt will complete LB dressing with mod assist OT Short Term Goal 4 - Progress (Week 3): Met Week 4:  OT Short Term Goal 1 (Week 4): STG = LTGs due to remaining LOS  Skilled Therapeutic Interventions/Progress Updates:    Treatment session with focus on functional transfers, sit <> stand, and dynamic standing balance.  Pt received upright in w/c on phone with significant other.  Pt required increased cues initially to reorient to time and scheduled therapy session.  Engaged in stand pivot transfers with focus on weight shifting and sequencing of mobility.  Pt currently requires mod assist for stand pivot transfers.  Engaged in dynamic standing activity incorporating sit > stand, weight shifting, and WB through LUE.  Pt able to progress to sit > stand with CGA.  Engaged in trunk rotation and weight shifting to obtain items from table to Rt, followed by reaching to various planes to match cards to "card board".  Min assist and blocking provided at Lt knee during static standing, up to mod assist during dynamic reaching tasks.  As pt fatigues, he demonstrates increased Lt lean but able to correct with multimodal cues and increased time.  Pt returned to room and left upright in w/c with seat belt alarm on, half lap tray for positioning, and all needs in reach.  Of note- removed AFO throughout standing task as  felt that pt had increased difficulty coming into full upright standing with AFO.   Therapy Documentation Precautions:  Precautions Precautions: Fall Precaution Comments: L hemiparesis Restrictions Weight Bearing Restrictions: No General:   Vital Signs: Therapy Vitals Temp: 98.6 F (37  C) Pulse Rate: 64 Resp: 18 BP: 114/72 Patient Position (if appropriate): Sitting Oxygen Therapy SpO2: 100 % O2 Device: Room Air Pain:  Pt with no c/o pain   Therapy/Group: Individual Therapy  Simonne Come 09/25/2019, 3:40 PM

## 2019-09-25 NOTE — Plan of Care (Signed)
  Problem: Consults Goal: RH STROKE PATIENT EDUCATION Description: See Patient Education module for education specifics  Outcome: Progressing   Problem: RH BOWEL ELIMINATION Goal: RH STG MANAGE BOWEL WITH ASSISTANCE Description: STG Manage Bowel with Mod I Assistance. Outcome: Progressing Flowsheets (Taken 09/25/2019 1520) STG: Pt will manage bowels with assistance: 3-Moderate assistance   Problem: RH BLADDER ELIMINATION Goal: RH STG MANAGE BLADDER WITH ASSISTANCE Description: STG Manage Bladder With Mod I Assistance Outcome: Progressing Flowsheets (Taken 09/25/2019 1520) STG: Pt will manage bladder with assistance: 5-Supervision/set up   Problem: RH SAFETY Goal: RH STG ADHERE TO SAFETY PRECAUTIONS W/ASSISTANCE/DEVICE Description: STG Adhere to Safety Precautions With Min Assistance/Device. Outcome: Progressing Flowsheets (Taken 09/25/2019 1520) STG:Pt will adhere to safety precautions with assistance/device: 3-Moderate assistance   Problem: RH COGNITION-NURSING Goal: RH STG ANTICIPATES NEEDS/CALLS FOR ASSIST W/ASSIST/CUES Description: STG Anticipates Needs/Calls for Assist With Min Assistance/Cues. Outcome: Progressing Flowsheets (Taken 09/25/2019 1520) STG: Anticipates needs/calls for assistance with assistance/cues: 4-Minimal assistance

## 2019-09-25 NOTE — Progress Notes (Addendum)
NT made Nurse aware of elevated temperature of 101.7 at 2035. After speaking with Charge Nurse Varney Biles) it was decided to notify Linna Hoff, [place] on a decision for next steps. Linna Hoff, PA gave verbal orders for CXR 2 views, U/A C&S, BMET, Blood Culture x 2, and to administer standing order of Tylenol. Tylenol was administered and temperature rechecked at 2145. T-102.1 orally and T-103 Axillary.

## 2019-09-25 NOTE — Progress Notes (Addendum)
Subjective: No new complaints   Antibiotics:  Anti-infectives (From admission, onward)   None      Medications: Scheduled Meds: . atorvastatin  20 mg Oral Daily  . clopidogrel  75 mg Oral Daily  . enoxaparin (LOVENOX) injection  40 mg Subcutaneous Q24H  . feeding supplement (PRO-STAT SUGAR FREE 64)  30 mL Oral BID  . insulin aspart  0-9 Units Subcutaneous QAC breakfast  . methylphenidate  5 mg Oral Daily  . BAY [id number] or placebo (Green [place])  1 tablet Oral Daily   And  . BAY [id number] or placebo (Blue Bottle)  1 tablet Oral Daily   Continuous Infusions: PRN Meds:.acetaminophen **OR** acetaminophen (TYLENOL) oral liquid 160 mg/5 mL **OR** acetaminophen, Muscle Rub, sorbitol    Objective: Weight change:   Intake/Output Summary (Last 24 hours) at 09/25/2019 1252 Last data filed at 09/25/2019 0730 Gross per 24 hour  Intake 420 ml  Output 951 ml  Net -531 ml   Blood pressure 114/72, pulse 64, temperature 98.6 F (37 C), resp. rate 18, height _0  (1.702 m), weight 73.8 kg, SpO2 100 %. Temp:  [98 F (36.7 C)-98.6 F (37 C)] 98.6 F (37 C) (02/03 1214) Pulse Rate:  [59-118] 64 (02/03 1214) Resp:  [13-28] 18 (02/03 1214) BP: (114-147)/(67-86) 114/72 (02/03 1214) SpO2:  [95 %-100 %] 100 % (02/03 1214)  Physical Exam: General: Alert and awake, oriented x3, with eye patch HEENT: anicteric sclera,  CVS regular rate, normal  Chest: , no wheezing, no respiratory distress Abdomen: soft non-distended,  Skin: no rashes Neuro:  Left hemiparesis CBC:    BMET Recent Labs    09/23/19 0803  NA 133*  K 3.8  CL 98  CO2 23  GLUCOSE 168*  BUN 10  CREATININE 1.02  CALCIUM 9.7     Liver Panel  No results for input(s): PROT, ALBUMIN, AST, ALT, ALKPHOS, BILITOT, BILIDIR, IBILI in the last 72 hours.     Sedimentation Rate No results for input(s): ESRSEDRATE in the last 72 hours. C-Reactive Protein No results for input(s): CRP in the last 72  hours.  Micro Results: Recent Results (from the past 720 hour(s))  SARS CORONAVIRUS 2 (TAT 6-24 HRS) Nasopharyngeal Nasopharyngeal Swab     Status: None   Collection Time: 08/29/19  9:18 PM   Specimen: Nasopharyngeal Swab  Result Value Ref Range Status   SARS Coronavirus 2 NEGATIVE NEGATIVE Final    Comment: (NOTE) SARS-CoV-2 target nucleic acids are NOT DETECTED. The SARS-CoV-2 RNA is generally detectable in upper and lower respiratory specimens during the acute phase of infection. Negative results do not preclude SARS-CoV-2 infection, do not rule out co-infections with other pathogens, and should not be used as the sole basis for treatment or other patient management decisions. Negative results must be combined with clinical observations, patient history, and epidemiological information. The expected result is Negative. Fact Sheet for Patients: SugarRoll.be Fact Sheet for Healthcare Providers: https://www.woods-mathews.com/ This test is not yet approved or cleared by the [place] FDA and  has been authorized for detection and/or diagnosis of SARS-CoV-2 by FDA under an Emergency Use Authorization (EUA). This EUA will remain  in effect (meaning this test can be used) for the duration of the COVID-19 declaration under Section 56 4(b)(1) of the Act, 21 U.S.C. section 360bbb-3(b)(1), unless the authorization is terminated or revoked sooner. Performed at [facility], [place] [address] [place], [place] [address]   Culture, blood (routine x  2)     Status: None   Collection Time: 09/09/19 11:35 AM   Specimen: BLOOD  Result Value Ref Range Status   Specimen Description BLOOD RIGHT ANTECUBITAL  Final   Special Requests   Final    BOTTLES DRAWN AEROBIC AND ANAEROBIC Blood Culture results may not be optimal due to an inadequate volume of blood received in culture bottles   Culture   Final    NO GROWTH 5 DAYS Performed at [facility], [place] [address] [place], [place] [address]    Report Status 09/14/2019 FINAL  Final  Culture, blood (routine x 2)     Status: None   Collection Time: 09/09/19 11:43 AM   Specimen: BLOOD LEFT HAND  Result Value Ref Range Status   Specimen Description BLOOD LEFT HAND  Final   Special Requests   Final    BOTTLES DRAWN AEROBIC ONLY Blood Culture results may not be optimal due to an inadequate volume of blood received in culture bottles   Culture   Final    NO GROWTH 5 DAYS Performed at [facility], [place] [address], [place], [place] [address]    Report Status 09/14/2019 FINAL  Final  Culture, Urine     Status: None   Collection Time: 09/09/19  3:23 PM   Specimen: Urine, Clean Catch  Result Value Ref Range Status   Specimen Description URINE, CLEAN CATCH  Final   Special Requests Immunocompromised  Final   Culture   Final    NO GROWTH Performed at [facility], [place] [address], [place], [place] [address]    Report Status 09/10/2019 FINAL  Final  Culture, respiratory     Status: None (Preliminary result)   Collection Time: 09/24/19  1:36 PM   Specimen: Bronchoalveolar Lavage; Respiratory  Result Value Ref Range Status   Specimen Description BRONCHIAL ALVEOLAR LAVAGE RML  Final   Special Requests NONE  Final   Gram Stain   Final    FEW WBC PRESENT,BOTH PMN AND MONONUCLEAR NO ORGANISMS SEEN    Culture   Final    CULTURE REINCUBATED FOR BETTER GROWTH Performed at [facility], [address] [place], [place] [address]    Report Status PENDING  Incomplete  Culture, bal-quantitative     Status: None (Preliminary result)   Collection Time: 09/24/19  1:38 PM   Specimen: Bronchial Brush; Respiratory  Result Value Ref Range Status   Specimen Description BRONCHIAL BRUSHING RML  Final   Special Requests NONE  Final   Gram Stain NO WBC SEEN NO ORGANISMS SEEN   Final   Culture   Final    NO GROWTH < 24 HOURS Performed at [facility], [place] [address] [place], [place] [address]    Report Status PENDING  Incomplete  Culture, respiratory     Status: None (Preliminary result)   Collection Time: 09/24/19  1:38 PM   Specimen: Bronchoalveolar Lavage; Respiratory  Result Value Ref Range Status   Specimen Description BRONCHIAL ALVEOLAR LAVAGE RLL  Final   Special Requests NONE  Final   Gram Stain   Final    ABUNDANT WBC PRESENT, PREDOMINANTLY PMN NO ORGANISMS SEEN    Culture   Final    CULTURE REINCUBATED FOR BETTER GROWTH Performed at [facility], [address] [place], [place] [address]    Report Status PENDING  Incomplete  Culture, bal-quantitative     Status: None (Preliminary result)  Collection Time: 09/24/19  1:38 PM   Specimen: Bronchial Brush  Result Value Ref Range Status   Specimen Description BRONCHIAL BRUSHING RLL  Final   Special Requests NONE  Final   Gram Stain   Final    FEW WBC PRESENT,BOTH PMN AND MONONUCLEAR NO ORGANISMS SEEN    Culture   Final    NO GROWTH < 24 HOURS Performed at [facility], [place] [address], [place], [place] [address]    Report Status PENDING  Incomplete    Studies/Results: No results found.    Assessment/Plan:  INTERVAL HISTORY: sp bronchoscopy   Principal Problem:   Right pontine CVA (HCC) Active Problems:   Acute on chronic anemia   Lymphopenia   Hypoalbuminemia due to protein-calorie malnutrition (HCC)   Controlled type 2 diabetes mellitus with hyperglycemia, without long-term current use of insulin (HCC)   Benign essential HTN   Labile blood glucose   Fever   Reactive depression   Opacity of lung on imaging study   History of hypertension   Sleep disturbance    [name] [name] is a 64 y.o. male currently admitted to rehab department following a recent acute CVA. 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   He has had chills for a month  and has had low grade fevers 100-101 F with leukopenia throughout present admission. Right middle lobe nodular opacity identified on chest imaging; previously similar finding in the left lung has resolved without   He has now undergone bronchoscopy with BAL and cultures are incubating.  He does not have overt pyogenic pneumonia  I will set up a follow-up visit for so he can see [place] in the clinic when we have his AFB cultures and fungal cultures finalized which may be roughly a month from now.  Fever certainly could be coming from his B-cell lymphoma itself.   [name] [name] has an appointment on 10/31/2019 at 28 AM with Janene Madeira, NP  The  [facility] is located in the [facility] at  [place] in [place].  [address], which is located to the left of the elevators.  Phone: [phone]  Fax: [phone]  [email or web address]  He should arrive at least 15 minutes prior to his appt.    LOS: 22 days   Alcide Evener 09/25/2019, 12:52 PM

## 2019-09-26 ENCOUNTER — Inpatient Hospital Stay (HOSPITAL_COMMUNITY): Payer: Medicare Other | Admitting: Speech Pathology

## 2019-09-26 ENCOUNTER — Inpatient Hospital Stay (HOSPITAL_COMMUNITY): Payer: Medicare Other | Admitting: Occupational Therapy

## 2019-09-26 ENCOUNTER — Ambulatory Visit (HOSPITAL_COMMUNITY): Payer: Medicare Other

## 2019-09-26 DIAGNOSIS — R0989 Other specified symptoms and signs involving the circulatory and respiratory systems: Secondary | ICD-10-CM

## 2019-09-26 DIAGNOSIS — C8330 Diffuse large B-cell lymphoma, unspecified site: Secondary | ICD-10-CM

## 2019-09-26 DIAGNOSIS — R509 Fever, unspecified: Secondary | ICD-10-CM

## 2019-09-26 LAB — URINALYSIS, ROUTINE W REFLEX MICROSCOPIC
Bilirubin Urine: NEGATIVE
Glucose, UA: NEGATIVE mg/dL
Hgb urine dipstick: NEGATIVE
Ketones, ur: NEGATIVE mg/dL
Leukocytes,Ua: NEGATIVE
Nitrite: NEGATIVE
Protein, ur: NEGATIVE mg/dL
Specific Gravity, Urine: 1.015 (ref 1.005–1.030)
pH: 6 (ref 5.0–8.0)

## 2019-09-26 LAB — CULTURE, RESPIRATORY W GRAM STAIN
Culture: NORMAL
Culture: NORMAL

## 2019-09-26 LAB — URINE CULTURE: Culture: NO GROWTH

## 2019-09-26 LAB — GLUCOSE, CAPILLARY: Glucose-Capillary: 86 mg/dL (ref 70–99)

## 2019-09-26 NOTE — Progress Notes (Signed)
Physical Therapy Session Note  Patient Details  Name: [name] MRN: [id number] Date of Birth: 07-Jun-1956  Today's Date: 09/26/2019 PT Individual Time: [id number] PT Individual Time Calculation (min): 75 min   Short Term Goals: Week 3:  PT Short Term Goal 1 (Week 3): Pt will perform bed<>chair transfer with LRAD CGA PT Short Term Goal 1 - Progress (Week 3): Progressing toward goal PT Short Term Goal 2 (Week 3): Pt will ambulate 59ft with LRAD CGA PT Short Term Goal 2 - Progress (Week 3): Progressing toward goal PT Short Term Goal 3 (Week 3): Pt will perform car transfer with LRAD CGA PT Short Term Goal 3 - Progress (Week 3): Progressing toward goal Week 4:  PT Short Term Goal 1 (Week 4): Pt will perform bed<>chair transfer with LRAD CGA PT Short Term Goal 2 (Week 4): Pt will ambulate 14ft with LRAD min A PT Short Term Goal 3 (Week 4): Pt will perform car transfer with LRAD CGA  Skilled Therapeutic Interventions/Progress Updates:   Received pt sitting in WC, pt agreeable to therapy, and denied any pain during session. Recreational therapy present during session and Megan from [place] present for L AFO adjustment/fitting. Session focused on functional mobility/transfers, ambulation with RW, LE strength, balance/coordination, NMR, and improved endurance with activity. Pt transported to gym in [facility] for time management purposes.  Pt ambulated 79ft x 2 trials and 53ft x 1 trial with RW min A with various AFO's with verbal cues for increased LLE step length, L knee extension, and weight shifting to R. Pt left with anterior AFO and Megan getting toe cap for L shoe. Pt required multiple rest breaks in between trials due to increased fatigue. Pt played standing blackjack with bilateral UE support on table. Therapist provided manual facilitation for L knee extension and CGA/min A for standing balance. Therapist also provided tactile cues for increased weight bearing of LUE on table. Pt reached outside BOS  towards R for cards from recreational therapist and brought them to midline on table to play using R UE. Pt required verbal cues for weight shifting to R and to maintain L knee extension. Pt demonstrated increased muscle fatigue towards end of session as evidenced by bilateral LE shaking L>R. Pt reported fatigue at end of session. Pt performed WC mobility 20ft with min A using R hemi technique. Pt required verbal cues to pull with R heel. Pt transported back to room in [facility] total assist remainder of way. Therapist donned jacket seated with max A. Concluded session with pt sitting in WC, needs within reach, and seatbelt alarm on. Therapist provided drink for patient.   Therapy Documentation Precautions:  Precautions Precautions: Fall Precaution Comments: L hemiparesis Restrictions Weight Bearing Restrictions: No   Therapy/Group: Individual Therapy Alfonse Alpers PT, DPT   09/26/2019, 7:42 AM

## 2019-09-26 NOTE — Progress Notes (Signed)
Occupational Therapy Session Note  Patient Details  Name: [name] [name] MRN: [id number] Date of Birth: 1956-03-17  Today's Date: 09/26/2019 OT Individual Time: 8421-0312 OT Individual Time Calculation (min): 60 min    Short Term Goals: Week 2:  OT Short Term Goal 1 (Week 2): Pt will recall hemi dressing techniques wiht no VC to don shirt OT Short Term Goal 1 - Progress (Week 2): Not met OT Short Term Goal 2 (Week 2): Pt will manage LUE prior to transfers to demo improved L attention OT Short Term Goal 2 - Progress (Week 2): Not met OT Short Term Goal 3 (Week 2): Pt will complete toilet transfer with Min assist, ambulating with RW OT Short Term Goal 3 - Progress (Week 2): Not met OT Short Term Goal 4 (Week 2): Pt will complete LB dressing with min assist (minus Lt shoe/AFO) OT Short Term Goal 4 - Progress (Week 2): Not met Week 3:  OT Short Term Goal 1 (Week 3): Pt will recall hemi dressing techniques with no VC to don shirt with mod assist OT Short Term Goal 1 - Progress (Week 3): Met OT Short Term Goal 2 (Week 3): Pt will manage LUE prior to transfers to demo improved L attention OT Short Term Goal 2 - Progress (Week 3): Met OT Short Term Goal 3 (Week 3): Pt will complete toilet transfer with Mod assist OT Short Term Goal 3 - Progress (Week 3): Met OT Short Term Goal 4 (Week 3): Pt will complete LB dressing with mod assist OT Short Term Goal 4 - Progress (Week 3): Met Week 4:  OT Short Term Goal 1 (Week 4): STG = LTGs due to remaining LOS  Skilled Therapeutic Interventions/Progress Updates:    Pt with continued fever in session 101 (see flowsheets) - RN made aware but pt wanted to continue with therapy and was agreeable to showering. Pt required contact guard to come to EOB and perform stand step transfer with min A to w/c and again in to the shower stall to tub bench with use of grab bar. Pt with lean to the left with decr awareness and required constant reminders when sitting  unsupported to correct posture and return to midline. Pt able to bathe with mod A sit to stand. In standing with UE support pt able to maintain static standing with supervision to contact guard. Pt able to don shirt with min A with cues for sequencing and setup. Pt with ripped jeans to don today - pt declined to cross LEs to thread hemi Le - a lot more effort to thread and pull up pants. Pants without faster today so total A with management of belt to hold up pants. Pt continues to required  To don his long socks (discussed more success with shorter socks) and fastening shoes and AFO.   Therapy Documentation Precautions:  Precautions Precautions: Fall Precaution Comments: L hemiparesis Restrictions Weight Bearing Restrictions: No Pain: No c/o pain in session   Therapy/Group: Individual Therapy  [name] [name] [facility] 09/26/2019, 11:11 AM

## 2019-09-26 NOTE — Discharge Summary (Signed)
Physician Discharge Summary  Patient ID: [name] [name] MRN: [id number] DOB/AGE: 63-Jan-1957 63 y.o.  Admit date: 09/03/2019 Discharge date: 09/27/2018  Discharge Diagnoses:  Principal Problem:   Right pontine CVA [facility]) Active Problems:   Acute on chronic anemia   Lymphopenia   Hypoalbuminemia due to protein-calorie malnutrition (HCC)   Controlled type 2 diabetes mellitus with hyperglycemia, without long-term current use of insulin (HCC)   Benign essential HTN   Labile blood glucose   Fever   Reactive depression   Opacity of lung on imaging study   History of hypertension   Sleep disturbance   Diffuse large B-cell lymphoma (HCC)   Temperature elevated   Labile blood pressure Leukopenia  Discharged Condition: Stable  Significant Diagnostic Studies: CT ANGIO HEAD W OR WO CONTRAST  Result Date: 08/30/2019 CLINICAL DATA:  Stroke follow-up EXAM: CT ANGIOGRAPHY HEAD AND NECK TECHNIQUE: Multidetector CT imaging of the head and neck was performed using the standard protocol during bolus administration of intravenous contrast. Multiplanar CT image reconstructions and MIPs were obtained to evaluate the vascular anatomy. Carotid stenosis measurements (when applicable) are obtained utilizing NASCET criteria, using the distal internal carotid diameter as the denominator. CONTRAST:  19mL OMNIPAQUE IOHEXOL 350 MG/ML SOLN COMPARISON:  Brain MRI 08/29/2019 FINDINGS: CTA NECK FINDINGS SKELETON: There is no bony spinal canal stenosis. No lytic or blastic lesion. OTHER NECK: Numerous bilateral cervical and supraclavicular lymph nodes. The largest are in the supraclavicular fossa and measure up to 9 mm. UPPER CHEST: There is scarring in the suprahilar left lung. AORTIC ARCH: There is minimal calcific atherosclerosis of the aortic arch. There is no aneurysm, dissection or hemodynamically significant stenosis of the visualized portion of the aorta. Conventional 3 vessel aortic branching pattern. The visualized  proximal subclavian arteries are widely patent. RIGHT CAROTID SYSTEM: Normal without aneurysm, dissection or stenosis. LEFT CAROTID SYSTEM: Normal without aneurysm, dissection or stenosis. VERTEBRAL ARTERIES: Codominant configuration. Both origins are clearly patent. There is no dissection, occlusion or flow-limiting stenosis to the skull base (V1-V3 segments). CTA HEAD FINDINGS POSTERIOR CIRCULATION: --Vertebral arteries: Normal V4 segments. --Posterior inferior cerebellar arteries (PICA): Patent origins from the vertebral arteries. --Anterior inferior cerebellar arteries (AICA): Patent origins from the basilar artery. --Basilar artery: Normal. --Superior cerebellar arteries: Normal. --Posterior cerebral arteries: Normal. Both originate from the basilar artery. Posterior communicating arteries (p-comm) are diminutive or absent. ANTERIOR CIRCULATION: --Intracranial internal carotid arteries: Normal. --Anterior cerebral arteries (ACA): Normal. Both A1 segments are present. Patent anterior communicating artery (a-comm). --Middle cerebral arteries (MCA): Normal. VENOUS SINUSES: As permitted by contrast timing, patent. ANATOMIC VARIANTS: None Review of the MIP images confirms the above findings. IMPRESSION: 1. No emergent large vessel occlusion or high-grade stenosis of the intracranial arteries. 2. Numerous bilateral cervical and supraclavicular lymph nodes. The largest are in the supraclavicular fossa and measure up to 9 mm, compatible with known lymphoma. Electronically Signed   By: Ulyses Jarred M.D.   On: 08/30/2019 03:03   DG Chest 2 View  Result Date: 09/25/2019 CLINICAL DATA:  64 year old male with fever. EXAM: CHEST - 2 VIEW COMPARISON:  Chest radiograph dated 09/10/2019. FINDINGS: Patchy area of increased density in the medial right lung base similar to prior radiograph. No new consolidative changes. There is no pleural effusion or pneumothorax. The cardiac silhouette is within normal limits. No acute  osseous pathology. IMPRESSION: No active cardiopulmonary disease.  No interval change. Electronically Signed   By: Anner Crete M.D.   On: 09/25/2019 22:34   DG Chest 2  View  Result Date: 09/10/2019 CLINICAL DATA:  Fevers EXAM: CHEST - 2 VIEW COMPARISON:  08/29/2019 FINDINGS: Persistent opacification of the medial aspect of the right lung base is noted. No new focal abnormality is seen. Cardiac shadow is stable. The lungs are otherwise clear. Bony structures are within normal limits. IMPRESSION: Stable opacity in the medial aspect of the right lung base similar to that seen on prior exam. Electronically Signed   By: Inez Catalina M.D.   On: 09/10/2019 10:52   CT HEAD WO CONTRAST  Result Date: 09/13/2019 CLINICAL DATA:  Stroke, follow-up, type II diabetes mellitus, hypertension EXAM: CT HEAD WITHOUT CONTRAST TECHNIQUE: Contiguous axial images were obtained from the base of the skull through the vertex without intravenous contrast. Sagittal and coronal MPR images reconstructed from axial data set. COMPARISON:  08/29/2019, MR brain 08/30/2019 FINDINGS: Brain: Normal ventricular morphology. No midline shift or mass effect. Normal appearance of brain parenchyma. No intracranial hemorrhage, mass lesion, evidence of acute infarction, or extra-axial fluid collection. RIGHT pontine infarct identified by prior MR not identified on current exam. Vascular: No hyperdense vessels Skull: Intact Sinuses/Orbits: Clear Other: N/A IMPRESSION: No acute intracranial abnormalities. Electronically Signed   By: Lavonia Dana M.D.   On: 09/13/2019 14:26   CT HEAD WO CONTRAST  Result Date: 08/29/2019 CLINICAL DATA:  64 year old male with focal neurologic deficit. EXAM: CT HEAD WITHOUT CONTRAST TECHNIQUE: Contiguous axial images were obtained from the base of the skull through the vertex without intravenous contrast. COMPARISON:  None. FINDINGS: Brain: The ventricles and sulci appropriate size for patient's age. The gray-white  matter discrimination is preserved. There is no acute intracranial hemorrhage. No mass effect or midline shift. No extra-axial fluid collection. Vascular: No hyperdense vessel or unexpected calcification. Skull: Normal. Negative for fracture or focal lesion. Sinuses/Orbits: Mild mucoperiosteal thickening of paranasal sinuses. The mastoid air cells are clear. Other: Prior fixation of left zygomatic fracture. IMPRESSION: Unremarkable noncontrast CT of the brain. Electronically Signed   By: Anner Crete M.D.   On: 08/29/2019 19:58   CT ANGIO NECK W OR WO CONTRAST  Result Date: 08/30/2019 CLINICAL DATA:  Stroke follow-up EXAM: CT ANGIOGRAPHY HEAD AND NECK TECHNIQUE: Multidetector CT imaging of the head and neck was performed using the standard protocol during bolus administration of intravenous contrast. Multiplanar CT image reconstructions and MIPs were obtained to evaluate the vascular anatomy. Carotid stenosis measurements (when applicable) are obtained utilizing NASCET criteria, using the distal internal carotid diameter as the denominator. CONTRAST:  168mL OMNIPAQUE IOHEXOL 350 MG/ML SOLN COMPARISON:  Brain MRI 08/29/2019 FINDINGS: CTA NECK FINDINGS SKELETON: There is no bony spinal canal stenosis. No lytic or blastic lesion. OTHER NECK: Numerous bilateral cervical and supraclavicular lymph nodes. The largest are in the supraclavicular fossa and measure up to 9 mm. UPPER CHEST: There is scarring in the suprahilar left lung. AORTIC ARCH: There is minimal calcific atherosclerosis of the aortic arch. There is no aneurysm, dissection or hemodynamically significant stenosis of the visualized portion of the aorta. Conventional 3 vessel aortic branching pattern. The visualized proximal subclavian arteries are widely patent. RIGHT CAROTID SYSTEM: Normal without aneurysm, dissection or stenosis. LEFT CAROTID SYSTEM: Normal without aneurysm, dissection or stenosis. VERTEBRAL ARTERIES: Codominant configuration. Both  origins are clearly patent. There is no dissection, occlusion or flow-limiting stenosis to the skull base (V1-V3 segments). CTA HEAD FINDINGS POSTERIOR CIRCULATION: --Vertebral arteries: Normal V4 segments. --Posterior inferior cerebellar arteries (PICA): Patent origins from the vertebral arteries. --Anterior inferior cerebellar arteries (AICA): Patent origins from  the basilar artery. --Basilar artery: Normal. --Superior cerebellar arteries: Normal. --Posterior cerebral arteries: Normal. Both originate from the basilar artery. Posterior communicating arteries (p-comm) are diminutive or absent. ANTERIOR CIRCULATION: --Intracranial internal carotid arteries: Normal. --Anterior cerebral arteries (ACA): Normal. Both A1 segments are present. Patent anterior communicating artery (a-comm). --Middle cerebral arteries (MCA): Normal. VENOUS SINUSES: As permitted by contrast timing, patent. ANATOMIC VARIANTS: None Review of the MIP images confirms the above findings. IMPRESSION: 1. No emergent large vessel occlusion or high-grade stenosis of the intracranial arteries. 2. Numerous bilateral cervical and supraclavicular lymph nodes. The largest are in the supraclavicular fossa and measure up to 9 mm, compatible with known lymphoma. Electronically Signed   By: Ulyses Jarred M.D.   On: 08/30/2019 03:03   CT Chest W Contrast  Result Date: 08/28/2019 CLINICAL DATA:  Follow-up B-cell lymphoma after 2 cycles of induction chemo-immunotherapy EXAM: CT CHEST, ABDOMEN, AND PELVIS WITH CONTRAST TECHNIQUE: Multidetector CT imaging of the chest, abdomen and pelvis was performed following the standard protocol during bolus administration of intravenous contrast. CONTRAST:  144mL OMNIPAQUE IOHEXOL 300 MG/ML  SOLN COMPARISON:  PET-CT dated 05/15/2019 FINDINGS: CT CHEST FINDINGS Cardiovascular: The heart is normal in size. No pericardial effusion. No evidence of thoracic aortic aneurysm. Mild atherosclerotic calcifications of the aortic arch.  Mediastinum/Nodes: Calcified mediastinal and bilateral perihilar nodes. Dominant 10 mm short axis right paratracheal node, unchanged. Mildly prominent bilateral axillary nodes, measuring up to 10 mm short axis, previously 2.2 cm. Visualized thyroid is unremarkable. Lungs/Pleura: Prior central left upper lobe/suprahilar nodule has resolved. Calcifications with scarring in the left perihilar region. Patchy opacity in the medial right lower lobe, measuring 1.5 x 7.4 cm (series 4/image 104), new. Patchy/nodular opacity inferiorly in the right middle lobe measuring up to 13 mm (series 4/image 117), new. No pleural effusion or pneumothorax. Musculoskeletal: Degenerative changes of the thoracic spine. CT ABDOMEN PELVIS FINDINGS Hepatobiliary: Liver is within normal limits. Gallbladder is unremarkable. No intrahepatic or extrahepatic ductal dilatation. Pancreas: Within normal limits. Spleen: Spleen is normal in size. Adrenals/Urinary Tract: Adrenal glands are within normal limits. Small bilateral renal cysts, measuring up to 1.6 cm in the posterior right lower kidney. No hydronephrosis. Mildly thick-walled bladder, although underdistended. Stomach/Bowel: Stomach is notable for a tiny hiatal hernia. No evidence of bowel obstruction. Normal appendix (series 2/image 83). Vascular/Lymphatic: No evidence of abdominal aortic aneurysm. Mild atherosclerotic calcifications the bilateral common iliac arteries. Small para-aortic nodes, measuring up to 12 mm short axis on the right. Dominant 1.8 cm short axis right common iliac node (series 2/image 39), previously 4.0 cm. 11 mm short axis right external iliac node (series 2/image 100), previously 16 mm. Reproductive: Prostate is grossly unremarkable. Other: No abdominopelvic ascites. Musculoskeletal: Lumbar spine is unremarkable. IMPRESSION: Improving lymphadenopathy in the chest, abdomen, and pelvis. Dominant axillary nodes measure up to 10 mm, previously 2.2 cm. Dominant right common  iliac node measures 1.8 cm, previously 4.0 cm. Prior left upper lobe/suprahilar nodular opacity has resolved. New patchy/nodular opacities in the right middle and lower lobes. While lymphomatous involvement is possible, the appearance favors infection/inflammation. Spleen is normal in size. Electronically Signed   By: Julian Hy M.D.   On: 08/28/2019 16:02   MR BRAIN WO CONTRAST  Result Date: 08/30/2019 CLINICAL DATA:  Stroke, follow-up, research study EXAM: MRI HEAD WITHOUT CONTRAST TECHNIQUE: Multiplanar, multiecho pulse sequences of the brain and surrounding structures were obtained without intravenous contrast. COMPARISON:  08/29/2019 FINDINGS: Brain: Reduced diffusion is again identified in the right paramedian pons.  Extent of abnormal signal appears slightly increased. There is no evidence of intracranial hemorrhage. Patchy T2 hyperintensity in the supratentorial white matter likely reflects stable chronic microvascular ischemic changes. Vascular: Major vessel flow voids at the skull base are preserved. Skull and upper cervical spine: Normal marrow signal is preserved. Sinuses/Orbits: Minor mucosal thickening. There is susceptibility artifact along the left orbit from fixation hardware. Other: Sella is unremarkable.  Mastoid air cells are clear. IMPRESSION: Evolving recent right paramedian pontine infarction with slight increase in extent of abnormal signal. No hemorrhage. Electronically Signed   By: Macy Mis M.D.   On: 08/30/2019 20:34   MR BRAIN WO CONTRAST  Result Date: 08/29/2019 CLINICAL DATA:  Awoke this morning with gait disturbance and slurred speech. EXAM: MRI HEAD WITHOUT CONTRAST TECHNIQUE: Multiplanar, multiecho pulse sequences of the brain and surrounding structures were obtained without intravenous contrast. COMPARISON:  Head CT same day. FINDINGS: Brain: Acute infarction affecting the right para median pons. No other acute brain insult. The brain is otherwise normal without  atrophy, old or acute infarction elsewhere, mass lesion, hemorrhage, hydrocephalus or extra-axial collection. Axial T2 imaging was omitted. Vascular: Major vessels at the base of the brain show flow. Skull and upper cervical spine: Negative Sinuses/Orbits: Clear Other: None IMPRESSION: Acute infarction of the right para median pons. Otherwise normal brain MRI. Electronically Signed   By: Nelson Chimes M.D.   On: 08/29/2019 21:11   CT Abdomen Pelvis W Contrast  Result Date: 08/28/2019 CLINICAL DATA:  Follow-up B-cell lymphoma after 2 cycles of induction chemo-immunotherapy EXAM: CT CHEST, ABDOMEN, AND PELVIS WITH CONTRAST TECHNIQUE: Multidetector CT imaging of the chest, abdomen and pelvis was performed following the standard protocol during bolus administration of intravenous contrast. CONTRAST:  146mL OMNIPAQUE IOHEXOL 300 MG/ML  SOLN COMPARISON:  PET-CT dated 05/15/2019 FINDINGS: CT CHEST FINDINGS Cardiovascular: The heart is normal in size. No pericardial effusion. No evidence of thoracic aortic aneurysm. Mild atherosclerotic calcifications of the aortic arch. Mediastinum/Nodes: Calcified mediastinal and bilateral perihilar nodes. Dominant 10 mm short axis right paratracheal node, unchanged. Mildly prominent bilateral axillary nodes, measuring up to 10 mm short axis, previously 2.2 cm. Visualized thyroid is unremarkable. Lungs/Pleura: Prior central left upper lobe/suprahilar nodule has resolved. Calcifications with scarring in the left perihilar region. Patchy opacity in the medial right lower lobe, measuring 1.5 x 7.4 cm (series 4/image 104), new. Patchy/nodular opacity inferiorly in the right middle lobe measuring up to 13 mm (series 4/image 117), new. No pleural effusion or pneumothorax. Musculoskeletal: Degenerative changes of the thoracic spine. CT ABDOMEN PELVIS FINDINGS Hepatobiliary: Liver is within normal limits. Gallbladder is unremarkable. No intrahepatic or extrahepatic ductal dilatation. Pancreas:  Within normal limits. Spleen: Spleen is normal in size. Adrenals/Urinary Tract: Adrenal glands are within normal limits. Small bilateral renal cysts, measuring up to 1.6 cm in the posterior right lower kidney. No hydronephrosis. Mildly thick-walled bladder, although underdistended. Stomach/Bowel: Stomach is notable for a tiny hiatal hernia. No evidence of bowel obstruction. Normal appendix (series 2/image 83). Vascular/Lymphatic: No evidence of abdominal aortic aneurysm. Mild atherosclerotic calcifications the bilateral common iliac arteries. Small para-aortic nodes, measuring up to 12 mm short axis on the right. Dominant 1.8 cm short axis right common iliac node (series 2/image 39), previously 4.0 cm. 11 mm short axis right external iliac node (series 2/image 100), previously 16 mm. Reproductive: Prostate is grossly unremarkable. Other: No abdominopelvic ascites. Musculoskeletal: Lumbar spine is unremarkable. IMPRESSION: Improving lymphadenopathy in the chest, abdomen, and pelvis. Dominant axillary nodes measure up to  10 mm, previously 2.2 cm. Dominant right common iliac node measures 1.8 cm, previously 4.0 cm. Prior left upper lobe/suprahilar nodular opacity has resolved. New patchy/nodular opacities in the right middle and lower lobes. While lymphomatous involvement is possible, the appearance favors infection/inflammation. Spleen is normal in size. Electronically Signed   By: Julian Hy M.D.   On: 08/28/2019 16:02   DG Chest Port 1 View  Result Date: 08/29/2019 CLINICAL DATA:  Slurred speech and stroke-like symptoms EXAM: PORTABLE CHEST 1 VIEW COMPARISON:  CT from the previous day. FINDINGS: Cardiac shadow is stable. The lungs are well aerated bilaterally. Persistent right basilar density is noted medially similar to that seen on prior CT examination. No bony abnormality is noted. IMPRESSION: Stable right lower lobe infiltrate. Electronically Signed   By: Inez Catalina M.D.   On: 08/29/2019 22:31    ECHOCARDIOGRAM COMPLETE  Result Date: 08/30/2019   ECHOCARDIOGRAM REPORT   Patient Name:   [name] [name] Date of Exam: 08/30/2019 Medical Rec #:  [id number]     Height:       67.0 in Accession #:    [id number]    Weight:       150.0 lb Date of Birth:  07-03-1956     BSA:          1.79 m Patient Age:    51 years      BP:           141/71 mmHg Patient Gender: M             HR:           74 bpm. Exam Location:  Inpatient Procedure: 2D Echo Indications:    stroke 434.91  History:        Patient has no prior history of Echocardiogram examinations.                 Risk Factors:Diabetes and Hypertension.  Sonographer:    Johny Chess Referring Phys: [place]  1. Left ventricular ejection fraction, by visual estimation, is 60 to 65%. The left ventricle has normal function. There is moderately increased left ventricular hypertrophy.  2. Global right ventricle has normal systolic function.The right ventricular size is normal.  3. Left atrial size was normal.  4. Right atrial size was normal.  5. The mitral valve is normal in structure. Trivial mitral valve regurgitation.  6. The tricuspid valve is normal in structure. Mild regurgitation.  7. The aortic valve is tricuspid. Aortic valve regurgitation is not visualized. No evidence of aortic valve sclerosis or stenosis.  8. The pulmonic valve was not well visualized. Pulmonic valve regurgitation is not visualized.  9. The inferior vena cava is normal in size with greater than 50% respiratory variability, suggesting right atrial pressure of 3 mmHg. 10. The tricuspid regurgitant velocity is 2.65 m/s, and with an assumed right atrial pressure of 3 mmHg, the estimated right ventricular systolic pressure is mildly elevated at 31.1 mmHg. FINDINGS  Left Ventricle: Left ventricular ejection fraction, by visual estimation, is 60 to 65%. The left ventricle has normal function. The left ventricle has no regional wall motion abnormalities. There is moderately  increased left ventricular hypertrophy. Asymmetric left ventricular hypertrophy. Left ventricular diastolic parameters were normal. Right Ventricle: The right ventricular size is normal. No increase in right ventricular wall thickness. Global RV systolic function is has normal systolic function. The tricuspid regurgitant velocity is 2.65 m/s, and with an assumed right atrial pressure  of 3  mmHg, the estimated right ventricular systolic pressure is mildly elevated at 31.1 mmHg. Left Atrium: Left atrial size was normal in size. Right Atrium: Right atrial size was normal in size Pericardium: There is no evidence of pericardial effusion. Mitral Valve: The mitral valve is normal in structure. Trivial mitral valve regurgitation. Tricuspid Valve: The tricuspid valve is normal in structure. Tricuspid valve regurgitation is mild. Aortic Valve: The aortic valve is tricuspid. Aortic valve regurgitation is not visualized. The aortic valve is structurally normal, with no evidence of sclerosis or stenosis. Pulmonic Valve: The pulmonic valve was not well visualized. Pulmonic valve regurgitation is not visualized. Pulmonic regurgitation is not visualized. Aorta: The aortic root is normal in size and structure. Venous: The inferior vena cava is normal in size with greater than 50% respiratory variability, suggesting right atrial pressure of 3 mmHg. IAS/Shunts: The atrial septum is grossly normal.  LEFT VENTRICLE PLAX 2D LVIDd:         4.90 cm  Diastology LVIDs:         3.30 cm  LV e' lateral:   13.10 cm/s LV PW:         1.10 cm  LV E/e' lateral: 5.1 LV IVS:        0.90 cm  LV e' medial:    8.16 cm/s LVOT diam:     2.10 cm  LV E/e' medial:  8.2 LV SV:         69 ml LV SV Index:   38.18 LVOT Area:     3.46 cm  RIGHT VENTRICLE RV S prime:     13.10 cm/s TAPSE (M-mode): 2.6 cm LEFT ATRIUM           Index       RIGHT ATRIUM           Index LA diam:      3.50 cm 1.96 cm/m  RA Area:     13.80 cm LA Vol (A2C): 47.2 ml 26.38 ml/m RA  Volume:   34.10 ml  19.06 ml/m LA Vol (A4C): 45.4 ml 25.37 ml/m  AORTIC VALVE LVOT Vmax:   132.00 cm/s LVOT Vmean:  81.200 cm/s LVOT VTI:    0.268 m  AORTA Ao Root diam: 3.20 cm MITRAL VALVE                        TRICUSPID VALVE MV Area (PHT): 2.87 cm             TR Peak grad:   28.1 mmHg MV PHT:        76.56 msec           TR Vmax:        265.00 cm/s MV Decel Time: 264 msec MV E velocity: 67.30 cm/s 103 cm/s  SHUNTS MV A velocity: 72.00 cm/s 70.3 cm/s Systemic VTI:  0.27 m MV E/A ratio:  0.93       1.5       Systemic Diam: 2.10 cm  Oswaldo Milian MD Electronically signed by Oswaldo Milian MD Signature Date/Time: 08/30/2019/4:58:42 PM    Final     Labs:  Basic Metabolic Panel: Recent Labs  Lab 09/20/19 0549 09/23/19 0803 09/25/19 2141  NA 130* 133* 134*  K 3.6 3.8 4.1  CL 97* 98 99  CO2 23 23 25   GLUCOSE 109* 168* 121*  BUN 10 10 12   CREATININE 0.97 1.02 1.11  CALCIUM 9.8 9.7 9.9    CBC: Recent Labs  Lab 09/20/19 0549 09/24/19 0528 09/25/19 2141  WBC 5.2 6.3 6.9  NEUTROABS 3.5 4.7  --   HGB 10.0* 9.5* 9.8*  HCT 29.7* 28.8* 29.7*  MCV 72.1* 71.8* 71.9*  PLT 320 318 338    CBG: Recent Labs  Lab 09/23/19 0558 09/24/19 0639 09/24/19 1311 09/25/19 0630 09/26/19 0628  GLUCAP 108* 116* 81 233* 55   Family history.  Mother with diabetes and hypertension.  Paternal uncle with cancer.  Denies any esophageal or rectal cancer  Brief HPI:   [name] [name] is a 64 y.o. right-handed male with history of diabetes mellitus, chronic hyponatremia, hypertension, remote tobacco abuse, CLL a small B-cell lymphoma followed by Dr. Irene Limbo with last chemotherapy October 2020.  Per chart review lives with 44 year old mother.  Independent prior to admission.  1 level home with ramped entrance.  Mother has an aide that assists her for a couple hours a day.  Presented 08/29/2019 with left-sided weakness and mild dysarthria.  Admission labs with hemoglobin 9.8, sodium 128, urine drug screen  negative, SARS coronavirus negative, hemoglobin 9.8, hematocrit 28.8, platelet 310,000, WBC 4.3.  Cranial CT scan unremarkable.  Patient did not receive TPA.  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  CT angiogram of head and neck with no emergent large vessel occlusion or high-grade stenosis.  CT of the chest abdomen and pelvis showed improving lymphadenopathy in the chest abdomen pelvis.  Echocardiogram with ejection fraction 65% without emboli.  Neurology consulted maintain on Plavix for CVA prophylaxis.  Patient was placed in [employer] factor XI inhibitor stroke prevention trial.  Subcutaneous Lovenox for DVT prophylaxis.  Persistent hyponatremia 126-130 initially maintained on normal saline.  Patient was admitted for a comprehensive rehab program   Hospital Course: [name] [name] was admitted to rehab 09/03/2019 for inpatient therapies to consist of PT, ST and OT at least three hours five days a week. Past admission physiatrist, therapy team and rehab RN have worked together to provide customized collaborative inpatient rehab.  Upon admission to rehab services patient was requiring +2 physical assist sit to stand, max assist sit to supine.  Moderate assist upper body bathing max is lower body bathing mod assist upper body dressing moderate assist lower body dressing   Blood pressures were monitored on TID basis and controlled  He is receiving routine toileting schedule  He has made gains during rehab stay and is attending therapies  He/ will continue to receive follow up therapies   after discharge  Rehab course: During patient's stay in rehab weekly team conferences were held to monitor patient's progress, set goals and discuss barriers to discharge. At admission, patient required.  Pertaining to patient's right paramedian pontine infarction remained stable maintained on Plavix therapy as well as Curator XI inhibitor stroke prevention trial of would follow  neurology services.  Mood stabilization with Ritalin to help patient regain focus and attention to task.  Chronic hyponatremia latest sodium 134 patient had been on 100 cc of IV fluids normal saline since discontinued.  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  Patient with persistent fevers intermittent low-grade borderline as well as leukopenia followed up per infectious disease urinalysis negative urine culture negative repeat urine unremarkable repeat urine culture pending.  Chest x-ray had showed right middle lobe nodular opacity identified on chest imaging as previously noted followed by oncology services.  Chest x-ray 09/25/2019 reviewed showing no active cardiopulmonary  disease no interval change.  Bronchoscopy completed 09/25/2019 during work-up of fever pathology report pending patient would follow with both infectious disease as well as pulmonary care as well as oncology services for history of CLL B-cell lymphoma.  A CT of the chest abdomen pelvis was requested by infectious disease showing overall similar adenopathy within the chest abdomen pelvis.  Some nodes measuring slightly larger than others.  There was some worsening right lower lobe airspace disease since 08/28/2019 however favored to represent chronic infection or aspiration.  Blood pressure a bit soft and currently on no anti-hypertensive meds and follow up with PCP.  Patient with history of obstructive sleep apnea refuses to wear CPAP.  Acute on chronic anemia 9.8 no bleeding episodes.  Physical exam.  Blood pressure 128/71 pulse 59 temperature 98.3 respirations 18 oxygen saturation 98% room air Constitutional alert and oriented provides person place and time H EENT Eyes.  Pupils round and reactive to light no discharge nystagmus Neck.  Supple nontender no JVD without thyromegaly Cardiac regular rate rhythm without any extra sounds or murmur heard Respiratory  effort normal no respiratory distress without wheeze GI.  Soft nontender positive bowel sounds without rebound Neurologic.  Alert and oriented makes good eye contact with examiner fair awareness of deficits. Sensation to light touch intact all 4 extremities and face intact.  Patient was refusing some manual muscle testing.  He  has had improvement in activity tolerance, balance, postural control as well as ability to compensate for deficits. He has had improvement in functional use RUE/LUE  and RLE/LLE as well as improvement in awareness.  Patient was fitted with a left AFO.  Sessions focused on functional mobility transfers ambulation with a rolling walker lower extremity strength balance coordination.  Ambulates 23 feet x 2 trials with rolling walker minimal assist on AFO.  Patient required multiple rest breaks in between trials.  Required some verbal cues for weight shifting.  Perform wheelchair mobility minimal assist using right hemitechnique.  Required contact-guard to come to edge of bed perform stand transfers minimal assist to wheelchair and again to the shower stall to tub bench with use of grab bar.  Patient able to bed with moderate assist sit to stand.  Able to don shirt with minimal assist.  He could communicate his needs.  Full family teaching completed plan discharge to home       Disposition: Discharge to home    Diet: Regular  Special Instructions: No driving smoking or alcohol Medications at discharge 1.  Tylenol as needed 2.  Lipitor 20 mg p.o. daily 3.  Plavix 75 mg p.o. daily 4.  Ritalin 5 mg p.o. daily 5.  [place] stroke Bayer factor XI inhibitor prevention trial  Discharge Instructions    Ambulatory referral to Neurology   Complete by: As directed    An appointment is requested in approximately 4 weeks right paramedian pontine infarction   Ambulatory referral to Physical Medicine Rehab   Complete by: As directed    Moderate complexity follow-up 1 to 2 weeks  right pontine infarction      Follow-up Information    Jamse Arn, MD Follow up.   Specialty: Physical Medicine and Rehabilitation Why: Office to call for appointment Contact information: [address] [place] [address] [phone]        Brunetta Genera, MD Follow up.   Specialties: Hematology, Oncology Why: Call for appointment Contact information: [place] [address] [phone]  Godley Callas, NP Follow up.   Specialty: Infectious Diseases Why: Follow up 10/31/2019 10:30 am Contact information: [address] [place] [address] [phone]           Signed: Lavon Paganini [place] 09/27/2019, 5:31 AM

## 2019-09-26 NOTE — Anesthesia Postprocedure Evaluation (Signed)
Anesthesia Post Note  Patient: [name] [name]  Procedure(s) Performed: VIDEO BRONCHOSCOPY WITHOUT FLUORO (N/A ) BRONCHIAL WASHINGS BRONCHIAL BRUSHINGS     Patient location during evaluation: PACU Anesthesia Type: General Level of consciousness: sedated and patient cooperative Pain management: pain level controlled Vital Signs Assessment: post-procedure vital signs reviewed and stable Respiratory status: spontaneous breathing Cardiovascular status: stable Anesthetic complications: no    Last Vitals:  Vitals:   09/26/19 0700 09/26/19 0952  BP:    Pulse:    Resp:    Temp: (!) 38.7 C 37.6 C  SpO2:      Last Pain:  Vitals:   09/26/19 0952  TempSrc: Oral  PainSc:                  Nolon Nations

## 2019-09-26 NOTE — Progress Notes (Signed)
T- 99.8 orally. Incontinent. Unable to obtain urine specimen due to incontinence. Attempted to do in & out catheter, pt refused.

## 2019-09-26 NOTE — Progress Notes (Signed)
Recreational Therapy Session Note  Patient Details  Name: [name] [name] MRN: [id number] Date of Birth: [birth year] Today's Date: 09/26/2019 Time:  [id number] Pain: no c/o Skilled Therapeutic Interventions/Progress Updates: Session focused on activity tolerance, sitting balance, dynamic standing balance reaching outside BOS & safety awareness during co-treat with PT.  Pt stood to play table top card game, LUE supported on tabletop while reaching with RUE to obtain cards with min assist.  Pt required instructional cues for posture & L knee control.  Pt propelled w/c ~50' using RLE and RUE with min assist and mod instructional cues.  Pt supervision/set up assist for simple tasks seated.  Therapy/Group: Co-Treatment  Nasri Boakye 09/26/2019, 2:22 PM

## 2019-09-26 NOTE — Progress Notes (Signed)
Recreational Therapy Discharge Summary Patient Details  Name: [name] [name] MRN: [id number] Date of Birth: 1955-10-25 Today's Date: 09/26/2019  Long term goals set: 1  Long term goals met: 1 Comments on progress toward goals: Pt is scheduled for discharge home with family/friend on 2/6 to provide 24 hour assistance.  TR sessions focused on activity analysis with potential modifications, activity tolerance, & dynamic standing balance.  Pt met min assist level for simple tasks standing and set up assist seated, verbal cues needed for posture, knee control and safety awareness.  Reasons for discharge: discharge from the hospital  Patient/family agrees with progress made and goals achieved: Yes  Kamaryn Grimley 09/26/2019, 3:36 PM

## 2019-09-26 NOTE — Progress Notes (Signed)
Speech Language Pathology Daily Session Note  Patient Details  Name: SAMI BLUNK MRN: [id number] Date of Birth: 02/20/1956  Today's Date: 09/26/2019 SLP Individual Time: 1100-1200 SLP Individual Time Calculation (min): 60 min  Short Term Goals: Week 4: SLP Short Term Goal 1 (Week 4): Pt will complete basic familiar problem solving tasks with Max A cues. SLP Short Term Goal 2 (Week 4): Pt will sustain attention to basic task for 5 minutes with Max A cues. SLP Short Term Goal 3 (Week 4): Pt will recall important medical facts related to self with Max  A cues. SLP Short Term Goal 4 (Week 4): Pt will utilize memory strategies to recall daily information with Max A cues.  Skilled Therapeutic Interventions:  Skilled treatment session targeted recall of information related to current medical condition and plan of care. Pt with repetitive questions related to results of bronc that reveal deficits in memory. SLP reviewed chart with pt and SLP wrote information down in notebook to increase pt's recall of information. Pt able to read information as he was wearing his eye patch. Pt continues to have confusion regarding oncology plan and pt was instructed to write down specific questions for oncology. Pt left upright in wheelchair, lap belt alarm in place and all needs within reach. Continue per current plan of care.      Pain    Therapy/Group: Individual Therapy  Hayle Parisi 09/26/2019, 3:28 PM

## 2019-09-26 NOTE — Progress Notes (Signed)
[facility] PHYSICAL MEDICINE & REHABILITATION PROGRESS NOTE  Subjective/Complaints: Patient seen laying in bed this morning.  He states he slept well overnight.  He wants to know the results of his study.  Again informed patient that it would take some time.  He was seen by ID, notes reviewed-plan for outpatient follow-up.  ROS: Denies cough, CP, SOB, N/V/D  Objective: Vital Signs: Blood pressure 136/69, pulse 67, temperature 99.6 F (37.6 C), temperature source Oral, resp. rate 17, height _0  (1.702 m), weight 73.8 kg, SpO2 99 %. DG Chest 2 View  Result Date: 09/25/2019 CLINICAL DATA:  64 year old male with fever. EXAM: CHEST - 2 VIEW COMPARISON:  Chest radiograph dated 09/10/2019. FINDINGS: Patchy area of increased density in the medial right lung base similar to prior radiograph. No new consolidative changes. There is no pleural effusion or pneumothorax. The cardiac silhouette is within normal limits. No acute osseous pathology. IMPRESSION: No active cardiopulmonary disease.  No interval change. Electronically Signed   By: Anner Crete M.D.   On: 09/25/2019 22:34   Recent Labs    09/24/19 0528 09/25/19 2141  WBC 6.3 6.9  HGB 9.5* 9.8*  HCT 28.8* 29.7*  PLT 318 338   Recent Labs    09/25/19 2141  NA 134*  K 4.1  CL 99  CO2 25  GLUCOSE 121*  BUN 12  CREATININE 1.11  CALCIUM 9.9    Physical Exam: BP 136/69 (BP Location: Left Arm)   Pulse 67   Temp 99.6 F (37.6 C) (Oral)   Resp 17   Ht _1  (1.702 m)   Wt 73.8 kg   SpO2 99%   BMI 25.48 kg/m   Constitutional: No distress . Vital signs reviewed. HENT: Normocephalic.  Atraumatic. Eyes: EOMI. No discharge. Cardiovascular: No JVD. Respiratory: Normal effort.  No stridor. GI: Non-distended. Skin: Warm and dry.  Intact. Psych: Flat. Musc: No edema in extremities.  No tenderness in extremities. Neuro: Alert Motor:  LUE: shoulder abduction 1+/5, 0/5 distally, stable LLE: Hip flexion, knee extension 2+/5,  ankle dorsiflexion 0/5, stable No increase in tone noted   Assessment/Plan: 1. Functional deficits secondary to right paramedian pontine infarct which require 3+ hours per day of interdisciplinary therapy in a comprehensive inpatient rehab setting.  Physiatrist is providing close team supervision and 24 hour management of active medical problems listed below.  Physiatrist and rehab team continue to assess barriers to discharge/monitor patient progress toward functional and medical goals  Care Tool:  Bathing    Body parts bathed by patient: Chest, Abdomen, Front perineal area, Right upper leg, Left upper leg, Face   Body parts bathed by helper: Right arm, Left arm, Buttocks, Right lower leg, Left lower leg     Bathing assist Assist Level: Moderate Assistance - Patient 50 - 74%     Upper Body Dressing/Undressing Upper body dressing   What is the patient wearing?: Pull over shirt    Upper body assist Assist Level: Maximal Assistance - Patient 25 - 49%    Lower Body Dressing/Undressing Lower body dressing      What is the patient wearing?: Pants     Lower body assist Assist for lower body dressing: Maximal Assistance - Patient 25 - 49%     Toileting Toileting Toileting Activity did not occur (Clothing management and hygiene only): N/A (no void or bm)  Toileting assist Assist for toileting: Total Assistance - Patient < 25% Assistive Device Comment: urinal   Transfers Chair/bed transfer  Transfers assist  Chair/bed transfer activity did not occur: Safety/medical concerns  Chair/bed transfer assist level: Moderate Assistance - Patient 50 - 74%     Locomotion Ambulation   Ambulation assist      Assist level: Moderate Assistance - Patient 50 - 74% Assistive device: Walker-rolling Max distance: 51f   Walk 10 feet activity   Assist  Walk 10 feet activity did not occur: Safety/medical concerns(decreased strength/activity tolerance)  Assist level: Moderate  Assistance - Patient - 50 - 74% Assistive device: Walker-rolling   Walk 50 feet activity   Assist Walk 50 feet with 2 turns activity did not occur: Safety/medical concerns(decreased strength/activity tolerance)  Assist level: Moderate Assistance - Patient - 50 - 74% Assistive device: Walker-rolling, Orthosis    Walk 150 feet activity   Assist Walk 150 feet activity did not occur: Safety/medical concerns(decreased strength/activity tolerance)         Walk 10 feet on uneven surface  activity   Assist Walk 10 feet on uneven surfaces activity did not occur: Safety/medical concerns(decreased strength/activity tolerance)         Wheelchair     Assist Will patient use wheelchair at discharge?: Yes Type of Wheelchair: Manual Wheelchair activity did not occur: Safety/medical concerns(Unable without skilled intervention, L hemi)  Wheelchair assist level: Supervision/Verbal cueing Max wheelchair distance: 1526f   Wheelchair 50 feet with 2 turns activity    Assist    Wheelchair 50 feet with 2 turns activity did not occur: (Unable without skilled intervention, L hemi)   Assist Level: Supervision/Verbal cueing   Wheelchair 150 feet activity     Assist Wheelchair 150 feet activity did not occur: (Unable without skilled intervention, L hemi)   Assist Level: Supervision/Verbal cueing      Medical Problem List and Plan: 1.  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  Antithrombotics: -DVT/anticoagulation: Lovenox             -antiplatelet therapy: Plavix 75 mg daily patient placed in [employer] XI inhibitor stroke prevention trial 3. Pain Management: Tylenol as needed  Voltaren gel changed to muscle rub right shoulder after discussion with neurology 4. Mood: Provide emotional support  Ritalin started, showing some improvement              -antipsychotic agents: N/A 5. Neuropsych: This patient is capable of making decisions on his own behalf. 6. Skin/Wound Care: Routine skin checks 7. Fluids/Electrolytes/Nutrition: Routine in and outs 8.  Chronic hyponatremia.  Low sodium in September through November 2020.    IVFs 100cc/hr d/ced  Na 134 on 2/3 9.  CLL/small B-cell lymphoma.  Follow-up Dr.Kale.  Patient last received chemotherapy October 2020. 10.  Type 2 diabetes mellitus with hyperglycemia.  Hemoglobin A1c 5.9.  Glucophage currently held.  Continue sliding scale  Labile on 2/4  Monitor with increased mobility 11.  Hyperlipidemia.  Lipitor 12.  History of hypertension.    On no medications at present  Labile on 2/4  Monitor with increased mobility 13. Mild Spasticity:  Cont to monitor  Controlled at present without medication-continue range of motion 14. OSA: refuses to wear CPAP 15. Hypoalbuminemia  Supplement initiated on 1/13 16. Leukopenia  WBCs 6.9 on 2/3  See #9  Cont to monitor 17. Acute on chronic anemia  Hb 9.8 on 2/3  Continue to monitor 18. Fever:   Persistent intermittent low-grade/borderline fevers  UA negative, urine culture negative, repeat UA unremarkable, repeat  urine culture pending  Blood cultures no growth final, repeat blood cultures pending  Chest x-ray on 2/3 personally reviewed, unchanged.  Appreciate ID recs, BAL performed, patient to follow-up as outpatient.  Bronchoscopy performed on 2/3, path remains pending 19.  Sleep disturbance  Ambien 10 d/ced due to lethargy  Overall improved  LOS: 23 days A FACE TO FACE EVALUATION WAS PERFORMED  Deslyn Cavenaugh Lorie Phenix 09/26/2019, 10:15 AM

## 2019-09-27 ENCOUNTER — Ambulatory Visit (HOSPITAL_COMMUNITY): Payer: Medicare Other

## 2019-09-27 ENCOUNTER — Inpatient Hospital Stay (HOSPITAL_COMMUNITY): Payer: Medicare Other

## 2019-09-27 ENCOUNTER — Inpatient Hospital Stay (HOSPITAL_COMMUNITY): Payer: Medicare Other | Admitting: Occupational Therapy

## 2019-09-27 ENCOUNTER — Encounter (HOSPITAL_COMMUNITY): Payer: Medicare Other | Admitting: Occupational Therapy

## 2019-09-27 ENCOUNTER — Encounter (HOSPITAL_COMMUNITY): Payer: Medicare Other | Admitting: Speech Pathology

## 2019-09-27 LAB — GLUCOSE, CAPILLARY: Glucose-Capillary: 115 mg/dL — ABNORMAL HIGH (ref 70–99)

## 2019-09-27 LAB — CULTURE, BAL-QUANTITATIVE W GRAM STAIN
Culture: NO GROWTH
Culture: NO GROWTH
Gram Stain: NONE SEEN

## 2019-09-27 MED ORDER — STUDY - INVESTIGATIONAL MEDICATION: 1.0000 mg | Freq: Every morning | 99 refills | Status: DC

## 2019-09-27 MED ORDER — IOHEXOL 300 MG/ML  SOLN
100.0000 mL | Freq: Once | INTRAMUSCULAR | Status: AC | PRN
  Administered 2019-09-27: 100 mL via INTRAVENOUS

## 2019-09-27 MED ORDER — METHYLPHENIDATE HCL 5 MG PO TABS: 5.0000 mg | ORAL_TABLET | Freq: Every day | ORAL | 0 refills | Status: DC

## 2019-09-27 MED ORDER — ATORVASTATIN CALCIUM 20 MG PO TABS: 20.0000 mg | ORAL_TABLET | Freq: Every day | ORAL | 0 refills | Status: DC

## 2019-09-27 MED ORDER — PANTOPRAZOLE SODIUM 40 MG PO TBEC: 40.0000 mg | DELAYED_RELEASE_TABLET | Freq: Every day | ORAL | 0 refills | Status: DC

## 2019-09-27 MED ORDER — ACETAMINOPHEN 325 MG PO TABS: 650.0000 mg | ORAL_TABLET | ORAL | Status: DC | PRN

## 2019-09-27 MED ORDER — CLOPIDOGREL BISULFATE 75 MG PO TABS: 75.0000 mg | ORAL_TABLET | Freq: Every day | ORAL | 0 refills | Status: DC

## 2019-09-27 MED ORDER — ZOLPIDEM TARTRATE 5 MG PO TABS
10.0000 mg | ORAL_TABLET | Freq: Every day | ORAL | Status: DC
  Filled 2019-09-27: qty 2

## 2019-09-27 NOTE — Progress Notes (Signed)
Occupational Therapy Discharge Summary  Patient Details  Name: [name] [name] MRN: [id number] Date of Birth: [birth year]  Patient has met 9 of 11 long term goals due to improved activity tolerance, improved balance, postural control, ability to compensate for deficits and improved awareness.  Patient to discharge at [facility] Assist level overall, Mod assist for bathing and LB dressing.  Patient's care partner is independent to provide the necessary physical and cognitive assistance at discharge.  [name] completed hands on education and demonstrated good carryover of education regarding body mechanics to provide assistance with functional transfers and standing balance.  Reasons goals not met: Pt continues to require mod assist for bathing and LB dressing due to Lt lean in standing and decreased awareness of LUE/LLE requiring increased cues and assistance.  Recommendation:  Patient will benefit from ongoing skilled OT services in home health setting to continue to advance functional skills in the area of BADL and Reduce care partner burden.  Equipment: 3 in 1  Reasons for discharge: treatment goals met and discharge from hospital  Patient/family agrees with progress made and goals achieved: Yes  OT Discharge Precautions/Restrictions  Precautions Precautions: Fall Precaution Comments: L hemiparesis Restrictions Weight Bearing Restrictions: No Pain Pain Assessment Pain Scale: 0-10 Pain Score: 0-No pain ADL ADL Eating: Set up Where Assessed-Eating: Chair Grooming: Supervision/safety, Setup Where Assessed-Grooming: Sitting at sink Upper Body Bathing: Minimal assistance Where Assessed-Upper Body Bathing: Shower Lower Body Bathing: Moderate assistance Where Assessed-Lower Body Bathing: Shower Upper Body Dressing: Minimal assistance Where Assessed-Upper Body Dressing: Sitting at sink Lower Body Dressing: Moderate assistance Where Assessed-Lower Body Dressing: Standing at sink,  Sitting at sink Toileting: Moderate assistance Where Assessed-Toileting: Glass blower/designer: Psychiatric nurse Method: Arts development officer: Bedside commode, Energy manager: Environmental education officer Method: Radiographer, therapeutic: Radio broadcast assistant, Grab bars Vision Baseline Vision/History: Wears glasses Wears Glasses: Reading only Patient Visual Report: Diplopia Vision Assessment?: Yes Eye Alignment: Impaired (comment) Ocular Range of Motion: Within Functional Limits Alignment/Gaze Preference: Within Defined Limits Tracking/Visual Pursuits: Decreased smoothness of eye movement to RIGHT superior field;Decreased smoothness of horizontal tracking Saccades: Undershoots Visual Fields: No apparent deficits Diplopia Assessment: Disappears with one eye closed;Objects split side to side;Present in far gaze Perception  Perception: Impaired Inattention/Neglect: Does not attend to left side of body Praxis Praxis: Impaired Praxis Impairment Details: Initiation;Motor planning Cognition Overall Cognitive Status: Impaired/Different from baseline Arousal/Alertness: Awake/alert Orientation Level: Oriented X4 Attention: Selective Selective Attention: Impaired Selective Attention Impairment: Verbal basic;Functional basic Memory: Impaired Memory Impairment: Decreased short term memory;Decreased recall of new information Decreased Short Term Memory: Functional basic;Verbal basic Awareness: Impaired Awareness Impairment: Intellectual impairment;Emergent impairment;Anticipatory impairment Problem Solving: Impaired Problem Solving Impairment: Functional basic Executive Function: (all areas impacted by lower level deficits) Safety/Judgment: Impaired Comments: Pt occasionally forgets to attend to L side Sensation Sensation Light Touch: Appears Intact Proprioception: Impaired by gross assessment(impaired LUE  and LLE) Coordination Gross Motor Movements are Fluid and Coordinated: No Fine Motor Movements are Fluid and Coordinated: No Coordination and Movement Description: Grossly uncoordinated due to L hemiparesis, L LE weakness, and decreased postural control Finger Nose Finger Test: [facility] on R, unable to perform on L Heel Shin Test: [facility] on R, unable to perform on L Mobility  Bed Mobility Bed Mobility: Rolling Right;Rolling Left;Supine to Sit;Sit to Supine Rolling Right: Supervision/verbal cueing Rolling Left: Supervision/Verbal cueing Supine to Sit: Supervision/Verbal cueing Sit to Supine: Supervision/Verbal cueing Transfers Sit to Stand: Contact  Guard/Touching assist(RW)   Balance Balance Balance Assessed: Yes Static Sitting Balance Static Sitting - Balance Support: No upper extremity supported Static Sitting - Level of Assistance: 5: Stand by assistance(supervision) Dynamic Sitting Balance Dynamic Sitting - Balance Support: Bilateral upper extremity supported Dynamic Sitting - Level of Assistance: 5: Stand by assistance(supervision) Static Standing Balance Static Standing - Balance Support: Bilateral upper extremity supported(RW) Static Standing - Level of Assistance: 5: Stand by assistance(CGA) Dynamic Standing Balance Dynamic Standing - Balance Support: Bilateral upper extremity supported(RW) Dynamic Standing - Level of Assistance: 4: Min assist Dynamic Standing - Comments: Requires min A for balance when ambulating Extremity/Trunk Assessment RUE Assessment RUE Assessment: Within Functional Limits LUE Assessment LUE Assessment: Exceptions to [facility] Active Range of Motion (AROM) Comments: is developing shoulder elevation and elbow flexion LUE Body System: Neuro Brunstrum levels for arm and hand: Arm;Hand Brunstrum level for arm: Stage II Synergy is developing Brunstrum level for hand: Stage I Flaccidity   Lilley Hubble 09/27/2019, 3:37 PM

## 2019-09-27 NOTE — Progress Notes (Signed)
Physical Therapy Discharge Summary  Patient Details  Name: [name] [name] MRN: [id number] Date of Birth: [birth year]  Today's Date: 09/27/2019 PT Individual Time: 0240-9735 PT Individual Time Calculation (min): 43 min    Patient has met 8 of 8 long term goals due to improved activity tolerance, improved balance, improved postural control, improved attention, improved awareness and improved coordination. Patient to discharge at a wheelchair level [place]. Patient's care partner is independent to provide the necessary physical assistance at discharge. [name] attended family education training and verbalized and demonstrated confidence with tasks for discharge home.   All goals met  Recommendation:  Patient will benefit from ongoing skilled PT services in home health setting to continue to advance safe functional mobility, address ongoing impairments in transfers, ambulation, LE strength, motor control/planning, balance/coordination, NMR, endurance, and to minimize fall risk.  Equipment: RW and manual WC  Reasons for discharge: treatment goals met and discharge from hospital  Patient/family agrees with progress made and goals achieved: Yes  Today's Interventions: Received pt on commode with OT practicing toilet transfers. [name] present for family education training. Session focused on discharge planning, simulated car transfers, ambulation, WC mobility, functional mobility, LE strength, NMR, and improved activity tolerance. Pt transported to gym in [facility] for time management purposes. Pt transferred WC<>car with therapist min A without AD. Pt transferred car<>WC without AD with son min A. [name] was educated on body mechanics, positioning, and technique for transfer. Pt and pt's son verbalized confidence with task and declined practicing again. Therapist educated pt's son on donning AFO with proper sequence as well as use of hand splint on RW, pt's son verbalized confidence. Pt ambulated 56f  with RW min A with therapist. Pt required verbal cues for L LE hip flexion, L LE knee extension, and weight shifting to R. Son and pt became emotional after progress pt has made walking. Therapist provided emotional support and encouragement, however therapist did educate pt/pt's son to avoid walking at home unless therapy is there. Pt performed WC mobility 575fwith supervision using R hemi technique. Discussed bed mobility/setup with pt and son and discussed safest method for entering bed. [facility] Pt reported urge to urinate and required mod A to use urinal seated in WC due to urgency. Pt and son verbalized confidence with all tasks for discharge home tomorrow. Concluded session with pt sitting in WC, needs within reach, and seatbelt alarm on.   PT Discharge Precautions/Restrictions Precautions Precautions: Fall Precaution Comments: L hemiparesis Restrictions Weight Bearing Restrictions: No  Cognition Overall Cognitive Status: Impaired/Different from baseline Arousal/Alertness: Awake/alert Orientation Level: Oriented X4 Memory: Impaired Awareness: Impaired Problem Solving: Impaired Safety/Judgment: Impaired Comments: Pt occasionally forgets to attend to L side Sensation Sensation Light Touch: Appears Intact Proprioception: Impaired by gross assessment(impaired on LLE) Coordination Gross Motor Movements are Fluid and Coordinated: No Fine Motor Movements are Fluid and Coordinated: No Coordination and Movement Description: Grossly uncoordinated due to L hemiparesis, L LE weakness, and decreased postural control Finger Nose [facility]inger Test: [facility], unable to perform on L Heel Shin Test: [facility], unable to perform on L Motor  Motor Motor: Hemiplegia;Abnormal postural alignment and control Motor - Skilled Clinical Observations: Grossly uncoordinated due to L hemiparesis UE>LE, L LE weakness, and decreased postural control   Mobility Bed Mobility Bed Mobility: Rolling Right;Rolling Left;Supine to Sit;Sit to Supine Rolling Right: Supervision/verbal cueing Rolling Left: Supervision/Verbal cueing Supine to Sit: Supervision/Verbal  cueing Sit to Supine: Supervision/Verbal cueing Transfers Transfers: Sit to Stand;Stand Pivot Transfers Sit to Stand: Contact Guard/Touching assist(RW) Stand Pivot Transfers: Minimal Assistance - Patient > 75% Stand Pivot Transfer Details: Verbal cues for technique;Verbal cues for precautions/safety;Verbal cues for sequencing;Verbal cues for safe use of DME/AE Stand Pivot Transfer Details (indicate cue type and reason): verbal cues for step sequence and technique when turning Transfer (Assistive device): Rolling walker Locomotion  Gait Ambulation: Yes Gait Assistance: Minimal Assistance - Patient > 75% Gait Distance (Feet): 15 Feet Assistive device: Rolling walker Gait Assistance Details: Verbal cues for technique;Verbal cues for sequencing;Verbal cues for precautions/safety;Verbal cues for gait pattern;Verbal cues for safe use of DME/AE Gait Assistance Details: verbal cues for L hip flexion, L knee extension, R weight shifting, and RW safety Gait Gait: Yes Gait Pattern: Impaired Gait Pattern: Decreased step length - left;Decreased stride length;Decreased hip/knee flexion - left;Decreased dorsiflexion - left;Decreased weight shift to right;Lateral trunk lean to left;Narrow base of support;Poor foot clearance - left;Decreased trunk rotation Gait velocity: decreased Stairs / Additional Locomotion Stairs: No Wheelchair Mobility Wheelchair Mobility: Yes Wheelchair Assistance: Chartered loss adjuster: Right upper extremity;Right lower extremity Wheelchair Parts Management: Needs assistance Distance: 50  Trunk/Postural Assessment  Cervical Assessment Cervical Assessment: Exceptions to WFL(forward head) Thoracic Assessment Thoracic Assessment: Exceptions to  WFL(mild kyphosis) Lumbar Assessment Lumbar Assessment: Exceptions to WFL(posterior pelvic tilt) Postural Control Postural Control: Deficits on evaluation  Balance Balance Balance Assessed: Yes Static Sitting Balance Static Sitting - Balance Support: No upper extremity supported Static Sitting - Level of Assistance: 5: Stand by assistance(supervision) Dynamic Sitting Balance Dynamic Sitting - Balance Support: Bilateral upper extremity supported Dynamic Sitting - Level of Assistance: 5: Stand by assistance(supervision) Static Standing Balance Static Standing - Balance Support: Bilateral upper extremity supported(RW) Static Standing - Level of Assistance: 5: Stand by assistance(CGA) Dynamic Standing Balance Dynamic Standing - Balance Support: Bilateral upper extremity supported(RW) Dynamic Standing - Level of Assistance: 4: Min assist Dynamic Standing - Comments: Requires min A for balance when ambulating Extremity Assessment RLE Assessment RLE Assessment: Exceptions to [facility] General Strength Comments: grossly generalized to 4/5 LLE Assessment LLE Assessment: Exceptions to [facility] General Strength Comments: hip flexion and knee extension 3/5, hip abd/add 2+/5    Alfonse Alpers PT, DPT  09/27/2019, 7:54 AM

## 2019-09-27 NOTE — Progress Notes (Signed)
Occupational Therapy Session Note  Patient Details  Name: [name] [name] MRN: [id number] Date of Birth: [birth year]  Today's Date: 09/27/2019 OT Individual Time: 0950-1100 and 1336-1420 OT Individual Time Calculation (min): 70 min and 44 min   Short Term Goals: Week 4:  OT Short Term Goal 1 (Week 4): STG = LTGs due to remaining LOS  Skilled Therapeutic Interventions/Progress Updates:    1) Treatment session with focus on ADL retraining, functional transfers, and d/c planning.  Pt received supine in bed expressing desire to work in gym.  Completed bed mobility with min assist for rolling without bedrails to come to sitting at EOB.  Completed sit > stand with CGA and stand pivot transfer to w/c with min assist.  Engaged in bathing and dressing at sit > stand level as pt incontinent of bladder.  Pt able to complete bathing with mod assist and min facilitation for weight shifting and standing balance.  Pt donned shirt with min assist after setup for hemi-technique.  Donned pants in figure 4 position with therapist stabilizing LLE in figure 4 to allow pt to thread pant leg.  Pt able to thread RLE with setup.  Sit > stand with CGA and then min assist for standing balance while pt pulled pants over hips and was able to fasten button this session.  Encouraged pt to wear sweat pant style pants for increased ease.  Engaged in sit > stand at high-low table with CGA for sit > stand and min assist for weight shifting and blocking at Lt knee.  Engaged in WB through [place] while reaching with RUE to facilitate weight shift and completing peg board task for attention and vision.  Returned to room and left upright in w/c with seat belt alarm on and all needs in reach.  2) Treatment session with focus on hands on family education with pt's son.  PA present upon arrival discussing medical issues.  Pt wanting to eat some lunch, therefore educated pt and pt's son on hemi-technique with bathing and dressing while demonstrating  dressing technique for shirt and figure 4 position for LB dressing.  Pt completed bed mobility CGA with bed rail.  Educated on fit and application of AFO in [place], pt continues to require max assist for donning shoes due to AFO and Lt weakness.  Educated on sit > stand and stand pivot transfers.  Therapist providing demonstration and cues for initial transfer.  [name] provided hands on assistance for toilet transfer with min cues for technique and body mechanics fading to no cues.  Completed transfers on/off toilet x2.  Encouraged communication between pt and son to facilitate increased ease and safety of mobility and self-care tasks.  Discussed shower transfer and need for family to order shower seat - strongly recommended waiting for HHOT to complete transfers in home shower due to nature of setup.  Passed off to PT.  Therapy Documentation Precautions:  Precautions Precautions: Fall Precaution Comments: L hemiparesis Restrictions Weight Bearing Restrictions: No Pain:  Pt with no c/o pain   Therapy/Group: Individual Therapy  Simonne Come 09/27/2019, 3:13 PM

## 2019-09-27 NOTE — Care Management (Signed)
   The overall goal for the admission was met for:   Discharge location: Home with son  Length of Stay: 18 days with discharge 09/28/19  Discharge activity level: Min assist overall  Home/community participation: Limited community participation  Services provided included: MD, RD, PT, OT, SLP, RN, CM, Pharmacy, Neuropsych and SW  Financial Services: Medicare  Follow-up services arranged: Home Health: PT, OT, ST via [facility], DME: standard wheelchair with 1/2 lap tray left, RW, 3n1 from [employer] and Patient/Family has no preference for HH/DME agencies  Comments (or additional information): Family education 09/27/19 with son. Marlow Baars to come in to assist for a week-2 weeks p discharge.  [place] [phone]  Patient/Family verbalized understanding of follow-up arrangements: Yes  Individual responsible for coordination of the follow-up plan: Lott Seelbach (son) [phone] Confirmed correct DME delivered: Margarito Liner 09/27/2019    Margarito Liner

## 2019-09-27 NOTE — Progress Notes (Signed)
Speech Language Pathology Discharge Summary  Patient Details  Name: [name] [name] MRN: [id number] Date of Birth: Sep 27, 1955  Today's Date: 09/27/2019 SLP Individual Time: 1300-1330 SLP Individual Time Calculation (min): 30 min   Skilled Therapeutic Interventions:  Skilled treatment session provided to educate pt's son on pt's current level of cognitive assistance. Specific examples provided on tasks, such as medication and money management, that pt will need help with. Pt disagrees with this information and is largely distracted TV. Pt lacks safety awareness and this puts him at high fall risk. Pt and son plan for pt to discharge tomorrow and will work on establishing 24 hour supervision and help among family members.       Patient has met 4 of 4 long term goals.  Patient to discharge at overall Mod;Max level.  Reasons goals not met:   N/A  Clinical Impression/Discharge Summary:   Pt has made slow progress in skilled ST sessions and as a result his LTGs were downgraded. Pt has been more alert and able to participate fully in sessions for approximately last week. He continues to exhibit moderate deficits in memory, selective attention, basic problem solving, emergent awareness and significantly reduced safety awareness. As such, I recommend 24 hour supervision, caregiver to help with medication and money management and follow up HHST to target the above mentioned deficits.   Care Partner:  Caregiver Able to Provide Assistance: Yes  Type of Caregiver Assistance: Physical;Cognitive  Recommendation:  Home Health SLP;24 hour supervision/assistance  Rationale for SLP Follow Up: Maximize functional communication;Maximize cognitive function and independence;Reduce caregiver burden   Equipment:   N/A  Reasons for discharge: Treatment goals met   Patient/Family Agrees with Progress Made and Goals Achieved: Yes    Julianny Milstein 09/27/2019, 1:35 PM

## 2019-09-27 NOTE — Progress Notes (Addendum)
[place] PHYSICAL MEDICINE & REHABILITATION PROGRESS NOTE  Subjective/Complaints: Patient seen laying in bed this AM.  He states he slept well overnight.  He again asks for the results of his biopsy.   ROS: Denies cough, CP, SOB, N/V/D  Objective: Vital Signs: Blood pressure 132/87, pulse 62, temperature 98.4 F (36.9 C), resp. rate 18, height _0  (1.702 m), weight 73.8 kg, SpO2 98 %. DG Chest 2 View  Result Date: 09/25/2019 CLINICAL DATA:  64 year old male with fever. EXAM: CHEST - 2 VIEW COMPARISON:  Chest radiograph dated 09/10/2019. FINDINGS: Patchy area of increased density in the medial right lung base similar to prior radiograph. No new consolidative changes. There is no pleural effusion or pneumothorax. The cardiac silhouette is within normal limits. No acute osseous pathology. IMPRESSION: No active cardiopulmonary disease.  No interval change. Electronically Signed   By: Anner Crete M.D.   On: 09/25/2019 22:34   Recent Labs    09/25/19 2141  WBC 6.9  HGB 9.8*  HCT 29.7*  PLT 338   Recent Labs    09/25/19 2141  NA 134*  K 4.1  CL 99  CO2 25  GLUCOSE 121*  BUN 12  CREATININE 1.11  CALCIUM 9.9    Physical Exam: BP 132/87 (BP Location: Right Arm)   Pulse 62   Temp 98.4 F (36.9 C)   Resp 18   Ht _1  (1.702 m)   Wt 73.8 kg   SpO2 98%   BMI 25.48 kg/m   Constitutional: No distress . Vital signs reviewed. HENT: Normocephalic.  Atraumatic. Eyes: EOMI. No discharge. Cardiovascular: No JVD. Respiratory: Normal effort.  No stridor. GI: Non-distended. Skin: Warm and dry.  Intact. Psych: Flat. Musc: No edema in extremities.  No tenderness in extremities. Neuro: Alert Motor:  LUE: shoulder abduction 1+/5, 0/5 distally, unchanged LLE: Hip flexion, knee extension 2+/5, ankle dorsiflexion 0/5, unchanged No increase in tone noted   Assessment/Plan: 1. Functional deficits secondary to right paramedian pontine infarct which require 3+ hours per day of  interdisciplinary therapy in a comprehensive inpatient rehab setting.  Physiatrist is providing close team supervision and 24 hour management of active medical problems listed below.  Physiatrist and rehab team continue to assess barriers to discharge/monitor patient progress toward functional and medical goals  Care Tool:  Bathing    Body parts bathed by patient: Chest, Abdomen, Front perineal area, Right upper leg, Left upper leg, Face, Left arm   Body parts bathed by helper: Right arm, Buttocks, Right lower leg, Left lower leg     Bathing assist Assist Level: Moderate Assistance - Patient 50 - 74%     Upper Body Dressing/Undressing Upper body dressing   What is the patient wearing?: Pull over shirt    Upper body assist Assist Level: Minimal Assistance - Patient > 75%    Lower Body Dressing/Undressing Lower body dressing      What is the patient wearing?: Incontinence brief, Pants     Lower body assist Assist for lower body dressing: Moderate Assistance - Patient 50 - 74%     Toileting Toileting Toileting Activity did not occur (Clothing management and hygiene only): N/A (no void or bm)  Toileting assist Assist for toileting: Total Assistance - Patient < 25% Assistive Device Comment: urinal   Transfers Chair/bed transfer  Transfers assist  Chair/bed transfer activity did not occur: Safety/medical concerns  Chair/bed transfer assist level: Minimal Assistance - Patient > 75%     Locomotion Ambulation   Ambulation  assist      Assist level: Moderate Assistance - Patient 50 - 74% Assistive device: Walker-rolling Max distance: 65f   Walk 10 feet activity   Assist  Walk 10 feet activity did not occur: Safety/medical concerns(decreased strength/activity tolerance)  Assist level: Moderate Assistance - Patient - 50 - 74% Assistive device: Walker-rolling   Walk 50 feet activity   Assist Walk 50 feet with 2 turns activity did not occur: Safety/medical  concerns(decreased strength/activity tolerance)  Assist level: Moderate Assistance - Patient - 50 - 74% Assistive device: Walker-rolling, Orthosis    Walk 150 feet activity   Assist Walk 150 feet activity did not occur: Safety/medical concerns(decreased strength/activity tolerance)         Walk 10 feet on uneven surface  activity   Assist Walk 10 feet on uneven surfaces activity did not occur: Safety/medical concerns(decreased strength/activity tolerance)         Wheelchair     Assist Will patient use wheelchair at discharge?: Yes Type of Wheelchair: Manual Wheelchair activity did not occur: Safety/medical concerns(Unable without skilled intervention, L hemi)  Wheelchair assist level: Minimal Assistance - Patient > 75% Max wheelchair distance: 522f   Wheelchair 50 feet with 2 turns activity    Assist    Wheelchair 50 feet with 2 turns activity did not occur: (Unable without skilled intervention, L hemi)   Assist Level: Minimal Assistance - Patient > 75%   Wheelchair 150 feet activity     Assist Wheelchair 150 feet activity did not occur: (Unable without skilled intervention, L hemi)   Assist Level: Supervision/Verbal cueing      Medical Problem List and Plan: 1.  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  Antithrombotics: -DVT/anticoagulation: Lovenox             -antiplatelet therapy: Plavix 75 mg daily patient placed in [employer] XI inhibitor stroke prevention trial 3. Pain Management: Tylenol as needed  Voltaren gel changed to muscle rub right shoulder after discussion with neurology 4. Mood: Provide emotional support  Ritalin started, with improvement             -antipsychotic agents: N/A 5. Neuropsych:  This patient is capable of making decisions on his own behalf. 6. Skin/Wound Care: Routine skin checks 7. Fluids/Electrolytes/Nutrition: Routine in and outs 8.  Chronic hyponatremia.  Low sodium in September through November 2020.    IVFs 100cc/hr d/ced  Na 134 on 2/3, follow up labs as outpatient 9.  CLL/small B-cell lymphoma.  Follow-up Dr.Kale.  Patient last received chemotherapy October 2020. 10.  Type 2 diabetes mellitus with hyperglycemia.  Hemoglobin A1c 5.9.  Glucophage currently held.  Continue sliding scale  Relatively controlled on 2/5  Monitor with increased mobility 11.  Hyperlipidemia.  Lipitor 12.  History of hypertension.    On no medications at present  Controlled on 2/5  Monitor with increased mobility 13. Mild Spasticity:  Cont to monitor  Controlled at present without medication-continue range of motion 14. OSA: refuses to wear CPAP 15. Hypoalbuminemia  Supplement initiated on 1/13 16. Leukopenia  WBCs 6.9 on 2/3  See #9  Cont to monitor 17. Acute on chronic anemia  Hb 9.8 on 2/3  Continue to monitor 18. Fever:   Persistent intermittent fevers  UA negative, urine culture negative, repeat  UA unremarkable, repeat urine culture no growth  Blood cultures no growth final, repeat blood cultures pending on 2/5  Chest x-ray on 2/3 personally reviewed, unchanged.  Appreciate ID recs, BAL performed, patient to follow-up as outpatient.    Discussed with ID given increase in fevers, will order CT chest, CT abd/pelvis.  Bronchoscopy performed on 2/3, path pending on 2/5 19.  Sleep disturbance  Ambien 10 d/ced due to lethargy  Overall improved  LOS: 24 days A FACE TO FACE EVALUATION WAS PERFORMED  Mosella Kasa Lorie Phenix 09/27/2019, 9:21 AM

## 2019-09-27 NOTE — Discharge Instructions (Signed)
Inpatient Rehab Discharge Instructions  TAL MCCREEDY Discharge date and time: No discharge date for patient encounter.   Activities/Precautions/ Functional Status: Activity: activity as tolerated Diet: regular Wound Care: none needed Functional status:  ___ No restrictions     ___ Walk up steps independently ___ 24/7 supervision/assistance   ___ Walk up steps with assistance ___ Intermittent supervision/assistance  ___ Bathe/dress independently ___ Walk with walker     _x__ Bathe/dress with assistance ___ Walk Independently    ___ Shower independently ___ Walk with assistance    ___ Shower with assistance ___ No alcohol     ___ Return to work/school ________    COMMUNITY REFERRALS UPON DISCHARGE:    Home Health:   PT     OT     ST                     Agency:  [facility]    Phone: [phone]     Medical Equipment/Items Ordered:  Wheelchair, walker, commode                                                     Agency/Supplier:  [facility] @ [phone]       Special Instructions: No driving smoking or alcohol  Continue [place] stroke preventative trial medications as directed per neurology services   STROKE/TIA DISCHARGE INSTRUCTIONS SMOKING Cigarette smoking nearly doubles your risk of having a stroke & is the single most alterable risk factor  If you smoke or have smoked in the last 12 months, you are advised to quit smoking for your health.  Most of the excess cardiovascular risk related to smoking disappears within a year of stopping.  Ask you doctor about anti-smoking medications  [place] Quit Line: 1-800-QUIT NOW  Free Smoking Cessation Classes [phone]  CHOLESTEROL Know your levels; limit fat & cholesterol in your diet  Lipid Panel     Component Value Date/Time   CHOL 131 08/30/2019 0225   TRIG 52 08/30/2019 0225   HDL 36 (L) 08/30/2019 0225   CHOLHDL 3.6 08/30/2019 0225   VLDL 10 08/30/2019 0225   LDLCALC 85 08/30/2019 0225      Many patients benefit from treatment even if their cholesterol is at goal.  Goal: Total Cholesterol (CHOL) less than 160  Goal:  Triglycerides (TRIG) less than 150  Goal:  HDL greater than 40  Goal:  LDL (LDLCALC) less than 100   BLOOD PRESSURE American Stroke Association blood pressure target is less that 120/80 mm/Hg  Your discharge blood pressure is:  BP: 130/67  Monitor your blood pressure  Limit your salt and alcohol intake  Many individuals will require more than one medication for high blood pressure  DIABETES (A1c is a blood sugar average for last 3 months) Goal HGBA1c is under 7% (HBGA1c is blood sugar average for last 3 months)  Diabetes:    Lab Results  Component Value Date   HGBA1C 5.9 (H) 08/29/2019     Your HGBA1c can be lowered with medications, healthy diet, and exercise.  Check your blood sugar as directed by your physician  Call your physician if you experience unexplained or low blood sugars.  PHYSICAL ACTIVITY/REHABILITATION Goal is 30 minutes at least 4 days per week  Activity: Increase activity slowly, Therapies: Physical Therapy: Home Health  Return to work:   Activity decreases your risk of heart attack and stroke and makes your heart stronger.  It helps control your weight and blood pressure; helps you relax and can improve your mood.  Participate in a regular exercise program.  Talk with your doctor about the best form of exercise for you (dancing, walking, swimming, cycling).  DIET/WEIGHT Goal is to maintain a healthy weight  Your discharge diet is:  Diet Order            Diet Carb Modified Fluid consistency: Thin; Room service appropriate? Yes  Diet effective now              liquids Your height is:    Your current weight is: Weight: 73.8 kg Your Body Mass Index (BMI) is:  BMI (Calculated): 25.48  Following the type of diet specifically designed for you will help prevent another stroke.  Your goal weight range is:    Your goal Body  Mass Index (BMI) is 19-24.  Healthy food habits can help reduce 3 risk factors for stroke:  High cholesterol, hypertension, and excess weight.  RESOURCES Stroke/Support Group:  Call [phone]   STROKE EDUCATION PROVIDED/REVIEWED AND GIVEN TO PATIENT Stroke warning signs and symptoms How to activate emergency medical system (call 911). Medications prescribed at discharge. Need for follow-up after discharge. Personal risk factors for stroke. Pneumonia vaccine given:  Flu vaccine given:  My questions have been answered, the writing is legible, and I understand these instructions.  I will adhere to these goals & educational materials that have been provided to me after my discharge from the hospital.      My questions have been answered and I understand these instructions. I will adhere to these goals and the provided educational materials after my discharge from the hospital.  Patient/Caregiver Signature _______________________________ Date __________  Clinician Signature _______________________________________ Date __________  Please bring this form and your medication list with you to all your follow-up doctor's appointments.

## 2019-09-27 NOTE — Plan of Care (Signed)
  Problem: Consults Goal: RH STROKE PATIENT EDUCATION Description: See Patient Education module for education specifics  Outcome: Progressing   Problem: RH BOWEL ELIMINATION Goal: RH STG MANAGE BOWEL WITH ASSISTANCE Description: STG Manage Bowel with Mod I Assistance. Outcome: Progressing   Problem: RH BLADDER ELIMINATION Goal: RH STG MANAGE BLADDER WITH ASSISTANCE Description: STG Manage Bladder With Mod I Assistance Outcome: Progressing   Problem: RH SAFETY Goal: RH STG ADHERE TO SAFETY PRECAUTIONS W/ASSISTANCE/DEVICE Description: STG Adhere to Safety Precautions With Min Assistance/Device. Outcome: Progressing   Problem: RH COGNITION-NURSING Goal: RH STG ANTICIPATES NEEDS/CALLS FOR ASSIST W/ASSIST/CUES Description: STG Anticipates Needs/Calls for Assist With Min Assistance/Cues. Outcome: Progressing

## 2019-09-28 ENCOUNTER — Inpatient Hospital Stay (HOSPITAL_COMMUNITY): Payer: Medicare Other | Admitting: Physical Therapy

## 2019-09-28 LAB — GLUCOSE, CAPILLARY: Glucose-Capillary: 116 mg/dL — ABNORMAL HIGH (ref 70–99)

## 2019-09-28 NOTE — Plan of Care (Signed)
  Problem: Consults Goal: RH STROKE PATIENT EDUCATION Description: See Patient Education module for education specifics  Outcome: Progressing   Problem: RH BOWEL ELIMINATION Goal: RH STG MANAGE BOWEL WITH ASSISTANCE Description: STG Manage Bowel with Mod I Assistance. Outcome: Progressing   Problem: RH BLADDER ELIMINATION Goal: RH STG MANAGE BLADDER WITH ASSISTANCE Description: STG Manage Bladder With Mod I Assistance Outcome: Progressing   Problem: RH SAFETY Goal: RH STG ADHERE TO SAFETY PRECAUTIONS W/ASSISTANCE/DEVICE Description: STG Adhere to Safety Precautions With Min Assistance/Device. Outcome: Progressing   Problem: RH COGNITION-NURSING Goal: RH STG ANTICIPATES NEEDS/CALLS FOR ASSIST W/ASSIST/CUES Description: STG Anticipates Needs/Calls for Assist With Min Assistance/Cues. Outcome: Progressing

## 2019-09-28 NOTE — Progress Notes (Signed)
Patient discharge from IP rehab during shift accompanied by son and friend. Discharge instructions provided by Silvestre Mesi on Friday study med education provided by pharmacy. No further question at times. Adria Devon, LPN

## 2019-09-28 NOTE — Plan of Care (Signed)
  Problem: Consults Goal: RH STROKE PATIENT EDUCATION Description: See Patient Education module for education specifics  09/28/2019 1032 by Ellison Carwin A, LPN Outcome: Completed/Met 09/28/2019 1032 by Ellison Carwin A, LPN Outcome: Progressing   Problem: RH BOWEL ELIMINATION Goal: RH STG MANAGE BOWEL WITH ASSISTANCE Description: STG Manage Bowel with Mod I Assistance. 09/28/2019 1032 by Ellison Carwin A, LPN Outcome: Completed/Met 09/28/2019 1032 by Ellison Carwin A, LPN Outcome: Progressing   Problem: RH BLADDER ELIMINATION Goal: RH STG MANAGE BLADDER WITH ASSISTANCE Description: STG Manage Bladder With Mod I Assistance 09/28/2019 1032 by Ellison Carwin A, LPN Outcome: Completed/Met 09/28/2019 1032 by Ellison Carwin A, LPN Outcome: Progressing   Problem: RH SAFETY Goal: RH STG ADHERE TO SAFETY PRECAUTIONS W/ASSISTANCE/DEVICE Description: STG Adhere to Safety Precautions With Min Assistance/Device. 09/28/2019 1032 by Ellison Carwin A, LPN Outcome: Completed/Met 09/28/2019 1032 by Ellison Carwin A, LPN Outcome: Progressing   Problem: RH COGNITION-NURSING Goal: RH STG ANTICIPATES NEEDS/CALLS FOR ASSIST W/ASSIST/CUES Description: STG Anticipates Needs/Calls for Assist With Min Assistance/Cues. 09/28/2019 1032 by Ellison Carwin A, LPN Outcome: Completed/Met 09/28/2019 1032 by Adria Devon, LPN Outcome: Progressing

## 2019-09-30 ENCOUNTER — Telehealth: Payer: Self-pay | Admitting: Registered Nurse

## 2019-09-30 NOTE — Telephone Encounter (Signed)
Placed a call to Mr. Zackaree Callery son of Mr. Sakariye Yun, no answer. Left message to return the call.

## 2019-09-30 NOTE — Telephone Encounter (Signed)
Transitional Care call Transitional Questions answered by Penni Bombard   Patient name: [name] [name]  DOB: Dec 30, 1955 1. Are you/is patient experiencing any problems since coming home? No a. Are there any questions regarding any aspect of care? No 2. Are there any questions regarding medications administration/dosing? No, Son reports [name] doesn't have the medication. This provider explained the medications was sent to the pharmacy, he reports he wasn't instructed on the above. He was encouraged to pick up [name] medication he verbalizes understanding.  a. Are meds being taken as prescribed? See above. [name] will pick up his dad's medication [name].  b. "Patient should review meds with caller to confirm" Medication List Reviewed.  3. Have there been any falls? No 4. Has Home Health been to the house and/or have they contacted you? No a. If not, have you tried to contact them? No, He was instructed to call [facility] in the morning if he hasn't received a call. If no return call, to call office and this provider will assist in the above matter, he verbalizes understanding.  b. Can we help you contact them? See above.  5. Are bowels and bladder emptying properly? Yes a. Are there any unexpected incontinence issues? No b. If applicable, is patient following bowel/bladder programs? NA 6. Any fevers, problems with breathing, unexpected pain? No 7. Are there any skin problems or new areas of breakdown? No 8. Has the patient/family member arranged specialty MD follow up (ie cardiology/neurology/renal/surgical/etc.)?  [name] was instructed to call [facility] and Dr. Irene Limbo office to schedule HFU appointments, he verbalizes understanding.  a. Can we help arrange? No  9. Does the patient need any other services or support that we can help arrange? No 10. Are caregivers following through as expected in assisting the patient? Yes 11. Has the patient quit smoking,  drinking alcohol, or using drugs as recommended? (                        )  [name] expressed frustration with discharge instructions, his message will be sent to [facility]. [name] understanding.   Appointment date/time 10/10/2019  arrival time 1:40 for 2:00 appointment with Dr Posey Pronto. [facility]

## 2019-10-01 ENCOUNTER — Telehealth: Payer: Self-pay | Admitting: Hematology

## 2019-10-01 NOTE — Telephone Encounter (Signed)
Rescheduled per 2/9 sch msg, pt req. Called and spoke with Cecilie Lowers (son), confirmed 2/17 appt

## 2019-10-04 LAB — CULTURE, BLOOD (ROUTINE X 2)
Culture: NO GROWTH
Culture: NO GROWTH
Special Requests: ADEQUATE
Special Requests: ADEQUATE

## 2019-10-09 ENCOUNTER — Inpatient Hospital Stay: Payer: Medicare Other | Attending: Hematology | Admitting: Hematology

## 2019-10-09 ENCOUNTER — Other Ambulatory Visit: Payer: Self-pay

## 2019-10-09 ENCOUNTER — Inpatient Hospital Stay: Payer: Medicare Other

## 2019-10-09 VITALS — BP 135/78 | HR 75 | Temp 97.4°F | Resp 18 | Ht 67.0 in | Wt 154.7 lb

## 2019-10-09 DIAGNOSIS — Z8249 Family history of ischemic heart disease and other diseases of the circulatory system: Secondary | ICD-10-CM | POA: Diagnosis not present

## 2019-10-09 DIAGNOSIS — C9110 Chronic lymphocytic leukemia of B-cell type not having achieved remission: Secondary | ICD-10-CM

## 2019-10-09 DIAGNOSIS — I10 Essential (primary) hypertension: Secondary | ICD-10-CM | POA: Insufficient documentation

## 2019-10-09 DIAGNOSIS — Z833 Family history of diabetes mellitus: Secondary | ICD-10-CM | POA: Diagnosis not present

## 2019-10-09 DIAGNOSIS — D509 Iron deficiency anemia, unspecified: Secondary | ICD-10-CM | POA: Insufficient documentation

## 2019-10-09 DIAGNOSIS — Z79899 Other long term (current) drug therapy: Secondary | ICD-10-CM | POA: Insufficient documentation

## 2019-10-09 DIAGNOSIS — Z87891 Personal history of nicotine dependence: Secondary | ICD-10-CM | POA: Insufficient documentation

## 2019-10-09 DIAGNOSIS — E119 Type 2 diabetes mellitus without complications: Secondary | ICD-10-CM | POA: Insufficient documentation

## 2019-10-09 DIAGNOSIS — Z809 Family history of malignant neoplasm, unspecified: Secondary | ICD-10-CM | POA: Insufficient documentation

## 2019-10-09 DIAGNOSIS — D649 Anemia, unspecified: Secondary | ICD-10-CM | POA: Diagnosis not present

## 2019-10-09 LAB — CMP (CANCER CENTER ONLY)
ALT: 9 U/L (ref 0–44)
AST: 9 U/L — ABNORMAL LOW (ref 15–41)
Albumin: 3.8 g/dL (ref 3.5–5.0)
Alkaline Phosphatase: 72 U/L (ref 38–126)
Anion gap: 9 (ref 5–15)
BUN: 8 mg/dL (ref 8–23)
CO2: 25 mmol/L (ref 22–32)
Calcium: 9.4 mg/dL (ref 8.9–10.3)
Chloride: 99 mmol/L (ref 98–111)
Creatinine: 0.89 mg/dL (ref 0.61–1.24)
GFR, Est AFR Am: >60 mL/min (ref >60)
GFR, Estimated: >60 mL/min (ref >60)
Glucose, Bld: 111 mg/dL — ABNORMAL HIGH (ref 70–99)
Potassium: 3.5 mmol/L (ref 3.5–5.1)
Sodium: 133 mmol/L — ABNORMAL LOW (ref 135–145)
Total Bilirubin: 0.8 mg/dL (ref 0.3–1.2)
Total Protein: 7.3 g/dL (ref 6.5–8.1)

## 2019-10-09 LAB — CBC WITH DIFFERENTIAL/PLATELET
Abs Immature Granulocytes: 0.05 10*3/uL (ref 0.00–0.07)
Basophils Absolute: 0 10*3/uL (ref 0.0–0.1)
Basophils Relative: 1 %
Eosinophils Absolute: 0.2 10*3/uL (ref 0.0–0.5)
Eosinophils Relative: 4 %
HCT: 31.4 % — ABNORMAL LOW (ref 39.0–52.0)
Hemoglobin: 10.7 g/dL — ABNORMAL LOW (ref 13.0–17.0)
Immature Granulocytes: 1 %
Lymphocytes Relative: 4 %
Lymphs Abs: 0.3 10*3/uL — ABNORMAL LOW (ref 0.7–4.0)
MCH: 24.2 pg — ABNORMAL LOW (ref 26.0–34.0)
MCHC: 34.1 g/dL (ref 30.0–36.0)
MCV: 71 fL — ABNORMAL LOW (ref 80.0–100.0)
Monocytes Absolute: 0.6 10*3/uL (ref 0.1–1.0)
Monocytes Relative: 10 %
Neutro Abs: 4.9 10*3/uL (ref 1.7–7.7)
Neutrophils Relative %: 80 %
Platelets: 307 10*3/uL (ref 150–400)
RBC: 4.42 MIL/uL (ref 4.22–5.81)
RDW: 15.9 % — ABNORMAL HIGH (ref 11.5–15.5)
WBC: 6.1 10*3/uL (ref 4.0–10.5)
nRBC: 0 % (ref 0.0–0.2)

## 2019-10-09 LAB — IRON AND TIBC
Iron: 31 ug/dL — ABNORMAL LOW (ref 42–163)
Saturation Ratios: 11 % — ABNORMAL LOW (ref 20–55)
TIBC: 273 ug/dL (ref 202–409)
UIBC: 242 ug/dL (ref 117–376)

## 2019-10-09 LAB — SAMPLE TO BLOOD BANK

## 2019-10-09 LAB — LACTATE DEHYDROGENASE: LDH: 122 U/L (ref 98–192)

## 2019-10-09 LAB — FERRITIN: Ferritin: 358 ng/mL — ABNORMAL HIGH (ref 24–336)

## 2019-10-09 NOTE — Progress Notes (Signed)
HEMATOLOGY/ONCOLOGY CLINIC NOTE  Date of Service:  10/09/19   Patient Care Team: Bernerd Limbo, MD as PCP - General (Family Medicine) Brunetta Genera, MD as Consulting Physician (Hematology)   CHIEF COMPLAINTS/PURPOSE OF CONSULTATION:  Small B-Cell Lymphoma  HISTORY OF PRESENTING ILLNESS:   [name] [name] is a wonderful 64 y.o. 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 The pt reports that he is doing well overall.   The pt reports that in August 2018 he felt a knot on the left side of his neck, which he spoke about with his PCP Dr. Coletta Memos. He then had an MRI in January 2019, a CXR in February 2019 and then a biopsy on 10/26/17 (results noted below). He denies any constitutional symptoms as noted below. He notes that the swelling on his neck has not increased in size and is not painful.   He notes that he gets congested at night and is only able to breathe out of one nostril; he denies having a deviated septum. He notes that he has tried to have this worked up for the last 4 years without success and has tried saline sprays without success. He notes that he wakes up with dry mouth.   The pt takes Amlodipine, 81mg  Aspirin, Atorvastatin, Cyclobenzaprine, Ferrousul, Losartan Potassium, Meloxicam, Metformin 500mg , Percocet, Protonix, Potassium Chloride, and Ambein.   Of note prior to the patient's visit today, pt has had Lymph node, needle/core biopsy, Left Supraclavicular completed on 10/31/17 with results revealing SMALL LYMPHOCYTIC LYMPHOMA.  10/26/17 Tissue Flow Cytometry revealed a Monoclonal B Cell population identified.    08/28/17 MRI revealed Extensive cervical adenopathy. Adenopathy also noted in the subpectoral nodes.   CXR on 10/11/17 was revealed to be Normal.   His 10/11/17 CBC revealed all values WNL except for % Lymph at 66.3%, Mono % at 3.3% and % Gran at 30.4%, Lymphs Abs at 6.4k, Hgb at 12.5, HCT at 37.5,  MCV at 75.5, MCH at 25.1, RDW at 16.3, MPV at 6.4  On review of systems, pt reports left cervical lymph node swelling and denies fevers, chills, night sweats, unexpected weight loss, CP, SOB, abdominal pain or swelling, pain along the spine, changes in bowel habits, skin rashes, leg swelling, and any other symptoms.    On PMHx the pt reports Hyperlipidemia, Hypertensive reitonpathy, Hypopotassemia, DM Type 2, acid reflux, spinal stenosis.  On Social Hx the pt reports being a former smoker, having quit in 2012. He notes that he smoked about 5 cigars per week before quitting. He denies consuming much ETOH. He denies any recreational drug use. He denies any unsafe needle exposure.  On Family Hx the pt reports an uncle who had cancer but wasn't sure what kind.   INTERVAL HISTORY:  [name] [name] returns today regarding his Small B Cell Lymphoma. The patient's last visit with [place] was on 06/27/2019. The pt reports that he is doing well overall.  The pt reports that he had a stroke in early January and has recently been discharged from rehab. Pt is planning to have in-home care begin soon. He is still having difficulty with left-sided weakness. Pt has noticed slight improvement in the strength in his left side but notes that his movements do not feel connected. He denies any current swallowing issues and has been eating well. Pt has not received any treatment as a part of his clinical trial since being home. He has  an upcoming appointment with Dr. Leonie Man on 03/09.   Of note since the patient's last visit, pt has had CT C/A/P ([id number]) ([id number]) completed on 08/28/2019 with results revealing "Improving lymphadenopathy in the chest, abdomen, and pelvis. Dominant axillary nodes measure up to 10 mm, previously 2.2 cm. Dominant right common iliac node measures 1.8 cm, previously 4.0 cm. Prior left upper lobe/suprahilar nodular opacity has resolved. New patchy/nodular opacities in the right middle and lower  lobes. While lymphomatous involvement is possible, the appearance favors infection/inflammation. Spleen is normal in size."  Pt has had CT C/A/P ([id number]) ([id number]) completed on 09/27/2019 with results revealing "1. Overall similar adenopathy within the chest, abdomen, and pelvis. Some nodes measures slightly larger and others are slightly smaller. 2. Worsening right lower lobe airspace disease since 08/28/2019. Favored to represent acute on chronic infection or aspiration. Lymphomatous involvement felt less likely."  Lab results today (10/09/19) of CBC w/diff and CMP is as follows: all values are WNL except for Hgb at 10.7, HCT at 31.4, MCV at 71.0, MCH at 24.2, RDW at 15.9, Lymphs Abs at 0.3K, Sodium at 133, Glucose at 111, AST at 9. 10/09/2019 LDH at 122 10/09/2019 Ferritin at 358 10/09/2019 Haptoglobin is in progress 10/09/2019 Iron and TIBC is as follows: Iron at 31, TIBC at 273, Sat Ratios at 11, UIBC at 242   On review of systems, pt reports left sided weakness, healthy appetite and denies trouble swallowing, fevers, chills, night sweats, unexpected weight loss and any other symptoms.   MEDICAL HISTORY:  Past Medical History:  Diagnosis Date  . Allergic rhinitis, cause unspecified   . Backache, unspecified   . Body mass index 33.0-33.9, adult   . Diabetes mellitus without complication ([place])    Type II  . Elevated blood pressure reading without diagnosis of hypertension   . Esophageal reflux   . Generalized pain   . Heartburn   . Insomnia, unspecified   . 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 Other abnormal blood chemistry   . Other and unspecified hyperlipidemia   . Other dyspnea and respiratory abnormality   . Other nonspecific findings on examination of blood(790.99)   . Pain in joint, lower leg   . Pneumonia    1968  . Sleep apnea    Does not use or own a CPAP  . Tobacco use disorder   . Unspecified essential  hypertension     SURGICAL HISTORY: Past Surgical History:  Procedure Laterality Date  . bil wrist surgery    . BRONCHIAL BRUSHINGS  09/24/2019   Procedure: BRONCHIAL BRUSHINGS;  Surgeon: Candee Furbish, MD;  Location[facility] ENDOSCOPY;  Service: Pulmonary;;  . BRONCHIAL WASHINGS  09/24/2019   Procedure: BRONCHIAL WASHINGS;  Surgeon: Candee Furbish, MD;  Location: [facility] ENDOSCOPY;  Service: Pulmonary;;  . ENDOSCOPIC CONCHA BULLOSA RESECTION Bilateral 05/18/2018   Procedure: ENDOSCOPIC CONCHA BULLOSA RESECTION;  Surgeon: Jerrell Belfast, MD;  Location: [place];  Service: ENT;  Laterality: Bilateral;  . NASAL SEPTOPLASTY W/ TURBINOPLASTY Bilateral 05/18/2018   Procedure: NASAL SEPTOPLASTY WITH TURBINATE REDUCTION;  Surgeon: Jerrell Belfast, MD;  Location: [place];  Service: ENT;  Laterality: Bilateral;  . ORIF FOREARM FRACTURE     right  . plastic surgery to face    . SINUS ENDO W/FUSION Bilateral 05/18/2018   Procedure: ENDOSCOPIC SINUS SURGERY WITH NAVIGATION;  Surgeon: Jerrell Belfast, MD;  Location: [place];  Service: ENT;  Laterality: Bilateral;  . VIDEO BRONCHOSCOPY N/A 09/24/2019  Procedure: VIDEO BRONCHOSCOPY WITHOUT FLUORO;  Surgeon: Candee Furbish, MD;  Location: [facility] ENDOSCOPY;  Service: Pulmonary;  Laterality: N/A;    SOCIAL HISTORY: Social History   Socioeconomic History  . Marital status: Legally Separated    Spouse name: Not on file  . Number of children: 1  . Years of education: Not on file  . Highest education level: Not on file  Occupational History  . Not on file  Tobacco Use  . Smoking status: Former Smoker    Packs/day: 0.20    Years: 20.00    Pack years: 4.00    Types: Cigars    Quit date: 2014    Years since quitting: 7.1  . Smokeless tobacco: Never Used  Substance and Sexual Activity  . Alcohol use: Yes    Comment: 2 40oz beers a week  . Drug use: No  . Sexual activity: Not on file  Other Topics Concern  . Not on file  Social History Narrative  . Not on file    Social Determinants of Health   Financial Resource Strain:   . Difficulty of Paying Living Expenses: Not on file  Food Insecurity:   . Worried About Charity fundraiser in the Last Year: Not on file  . Ran Out of Food in the Last Year: Not on file  Transportation Needs:   . Lack of Transportation (Medical): Not on file  . Lack of Transportation (Non-Medical): Not on file  Physical Activity:   . Days of Exercise per Week: Not on file  . Minutes of Exercise per Session: Not on file  Stress:   . Feeling of Stress : Not on file  Social Connections:   . Frequency of Communication with Friends and Family: Not on file  . Frequency of Social Gatherings with Friends and Family: Not on file  . Attends Religious Services: Not on file  . Active Member of Clubs or Organizations: Not on file  . Attends Archivist Meetings: Not on file  . Marital Status: Not on file  Intimate Partner Violence:   . Fear of Current or Ex-Partner: Not on file  . Emotionally Abused: Not on file  . Physically Abused: Not on file  . Sexually Abused: Not on file    FAMILY HISTORY: Family History  Problem Relation Age of Onset  . Diabetes Mother   . Hypertension Mother   . Cancer Paternal Uncle     ALLERGIES:  is allergic to lisinopril and tuberculin tests.  MEDICATIONS:  Current Outpatient Medications  Medication Sig Dispense Refill  . acetaminophen (TYLENOL) 325 MG tablet Take 2 tablets (650 mg total) by mouth every 4 (four) hours as needed for mild pain (or temp > 37.5 C (99.5 F)).    [name] atorvastatin (LIPITOR) 20 MG tablet Take 1 tablet (20 mg total) by mouth daily. 30 tablet 0  . clopidogrel (PLAVIX) 75 MG tablet Take 1 tablet (75 mg total) by mouth daily. 30 tablet 0  . methylphenidate (RITALIN) 5 MG tablet Take 1 tablet (5 mg total) by mouth daily. 30 tablet 0  . Investigational - Study Medication Take 1 mg by mouth every morning. Study name:  Additional study details: 1 each PRN  .  Investigational - Study Medication Take 1 mg by mouth every morning. Study name: as per pharmacy Additional study details: 1 each PRN  . pantoprazole (PROTONIX) 40 MG tablet Take 1 tablet (40 mg total) by mouth at bedtime. (Patient not taking: Reported on 10/09/2019)  30 tablet 0   No current facility-administered medications for this visit.    REVIEW OF SYSTEMS:   A 10+ POINT REVIEW OF SYSTEMS WAS OBTAINED including neurology, dermatology, psychiatry, cardiac, respiratory, lymph, extremities, GI, GU, Musculoskeletal, constitutional, breasts, reproductive, HEENT.  All pertinent positives are noted in the HPI.  All others are negative.   PHYSICAL EXAMINATION: ECOG FS:1 - Symptomatic but completely ambulatory  Vitals:   10/09/19 0919  BP: 135/78  Pulse: 75  Resp: 18  Temp: (!) 97.4 F (36.3 C)  SpO2: 97%   Wt Readings from Last 3 Encounters:  10/09/19 154 lb 11.2 oz (70.2 kg)  09/24/19 162 lb 11.2 oz (73.8 kg)  08/29/19 150 lb (68 kg)   Body mass index is 24.23 kg/m.    Exam was given in a chair   GENERAL:alert, in no acute distress and comfortable SKIN: no acute rashes, no significant lesions EYES: conjunctiva are pink and non-injected, sclera anicteric OROPHARYNX: MMM, no exudates, no oropharyngeal erythema or ulceration NECK: supple, no JVD LYMPH:  no palpable lymphadenopathy in the cervical, axillary or inguinal regions LUNGS: clear to auscultation b/l with normal respiratory effort HEART: regular rate & rhythm ABDOMEN:  normoactive bowel sounds , non tender, not distended. No palpable hepatosplenomegaly.  Extremity: no pedal edema PSYCH: alert & oriented x 3 with fluent speech NEURO: no focal motor/sensory deficits  LABORATORY DATA:  I have reviewed the data as listed  . CBC Latest Ref Rng & Units 10/09/2019 09/25/2019 09/24/2019  WBC 4.0 - 10.5 K/uL 6.1 6.9 6.3  Hemoglobin 13.0 - 17.0 g/dL 10.7(L) 9.8(L) 9.5(L)  Hematocrit 39.0 - 52.0 % 31.4(L) 29.7(L) 28.8(L)   Platelets 150 - 400 K/uL 307 338 318    CBC    Component Value Date/Time   WBC 6.1 10/09/2019 0917   RBC 4.42 10/09/2019 0917   HGB 10.7 (L) 10/09/2019 0917   HGB 11.9 (L) 11/10/2017 1008   HCT 31.4 (L) 10/09/2019 0917   PLT 307 10/09/2019 0917   PLT 228 11/10/2017 1008   MCV 71.0 (L) 10/09/2019 0917   MCH 24.2 (L) 10/09/2019 0917   MCHC 34.1 10/09/2019 0917   RDW 15.9 (H) 10/09/2019 0917   LYMPHSABS 0.3 (L) 10/09/2019 0917   MONOABS 0.6 10/09/2019 0917   EOSABS 0.2 10/09/2019 0917   BASOSABS 0.0 10/09/2019 0917    CMP Latest Ref Rng & Units 10/09/2019 09/25/2019 09/23/2019  Glucose 70 - 99 mg/dL 111(H) 121(H) 168(H)  BUN 8 - 23 mg/dL 8 12 10   Creatinine 0.61 - 1.24 mg/dL 0.89 1.11 1.02  Sodium 135 - 145 mmol/L 133(L) 134(L) 133(L)  Potassium 3.5 - 5.1 mmol/L 3.5 4.1 3.8  Chloride 98 - 111 mmol/L 99 99 98  CO2 22 - 32 mmol/L 25 25 23   Calcium 8.9 - 10.3 mg/dL 9.4 9.9 9.7  Total Protein 6.5 - 8.1 g/dL 7.3 - -  Total Bilirubin 0.3 - 1.2 mg/dL 0.8 - -  Alkaline Phos 38 - 126 U/L 72 - -  AST 15 - 41 U/L 9(L) - -  ALT 0 - 44 U/L 9 - -   . Lab Results  Component Value Date   IRON 31 (L) 10/09/2019   TIBC 273 10/09/2019   IRONPCTSAT 11 (L) 10/09/2019   (Iron and TIBC)  Lab Results  Component Value Date   FERRITIN 358 (H) 10/09/2019   . Lab Results  Component Value Date   IRON 31 (L) 10/09/2019   TIBC 273 10/09/2019   IRONPCTSAT  11 (L) 10/09/2019   (Iron and TIBC)  Lab Results  Component Value Date   FERRITIN 358 (H) 10/09/2019    Component     Latest Ref Rng & Units 11/10/2017  Retic Ct Pct     0.8 - 1.8 % 1.3  RBC.     4.20 - 5.82 MIL/uL 4.75  Retic Count, Absolute     34.8 - 93.9 K/uL 61.8  LDH     125 - 245 U/L 152  HCV Ab     0.0 - 0.9 s/co ratio <0.1  HIV Screen 4th Generation wRfx     Non Reactive Non Reactive  Hepatitis B Surface Ag     Negative Negative  Hep B Core Ab, Tot     Negative Negative   05/15/2019: NUCLEAR MEDICINE PET SKULL  BASE TO THIGH   10/31/17 Tissue Flow Cytometry:     10/31/17 Lymph Node Needle/Core Biopsy:  11/10/17 Peripheral Blood Flow Cytometry:   11/10/17 FISH CLL Prognostic Panel:      RADIOGRAPHIC STUDIES: I have personally reviewed the radiological images as listed and agreed with the findings in the report.  08/28/17 MRI     DG Chest 2 View  Result Date: 09/25/2019 CLINICAL DATA:  64 year old male with fever. EXAM: CHEST - 2 VIEW COMPARISON:  Chest radiograph dated 09/10/2019. FINDINGS: Patchy area of increased density in the medial right lung base similar to prior radiograph. No new consolidative changes. There is no pleural effusion or pneumothorax. The cardiac silhouette is within normal limits. No acute osseous pathology. IMPRESSION: No active cardiopulmonary disease.  No interval change. Electronically Signed   By: Anner Crete M.D.   On: 09/25/2019 22:34   DG Chest 2 View  Result Date: 09/10/2019 CLINICAL DATA:  Fevers EXAM: CHEST - 2 VIEW COMPARISON:  08/29/2019 FINDINGS: Persistent opacification of the medial aspect of the right lung base is noted. No new focal abnormality is seen. Cardiac shadow is stable. The lungs are otherwise clear. Bony structures are within normal limits. IMPRESSION: Stable opacity in the medial aspect of the right lung base similar to that seen on prior exam. Electronically Signed   By: Inez Catalina M.D.   On: 09/10/2019 10:52   CT HEAD WO CONTRAST  Result Date: 09/13/2019 CLINICAL DATA:  Stroke, follow-up, type II diabetes mellitus, hypertension EXAM: CT HEAD WITHOUT CONTRAST TECHNIQUE: Contiguous axial images were obtained from the base of the skull through the vertex without intravenous contrast. Sagittal and coronal MPR images reconstructed from axial data set. COMPARISON:  08/29/2019, MR brain 08/30/2019 FINDINGS: Brain: Normal ventricular morphology. No midline shift or mass effect. Normal appearance of brain parenchyma. No intracranial hemorrhage,  mass lesion, evidence of acute infarction, or extra-axial fluid collection. RIGHT pontine infarct identified by prior MR not identified on current exam. Vascular: No hyperdense vessels Skull: Intact Sinuses/Orbits: Clear Other: N/A IMPRESSION: No acute intracranial abnormalities. Electronically Signed   By: Lavonia Dana M.D.   On: 09/13/2019 14:26   CT CHEST W CONTRAST  Result Date: 09/27/2019 CLINICAL DATA:  B-cell lymphoma. Weakness. Leukopenia. Fevers. Chest radiograph demonstrating lung opacities. EXAM: CT CHEST, ABDOMEN, AND PELVIS WITH CONTRAST TECHNIQUE: Multidetector CT imaging of the chest, abdomen and pelvis was performed following the standard protocol during bolus administration of intravenous contrast. CONTRAST:  148mL OMNIPAQUE IOHEXOL 300 MG/ML  SOLN COMPARISON:  Chest radiograph 09/25/2019. Prior chest abdomen pelvic CT of 08/28/2019. FINDINGS: CT CHEST FINDINGS Cardiovascular: Aortic atherosclerosis. Normal heart size, without pericardial effusion. No central pulmonary  embolism, on this non-dedicated study. Mediastinum/Nodes: Multiple small bilateral low cervical nodes, incompletely evaluated. Example left low jugular 1.0 cm node on 02/03. Not imaged on the prior. Bilateral axillary adenopathy. Index deep left axillary node measures 1.3 cm on 15/3. Likely similar on the prior when measured in a similar fashion. Right axillary index node measures 8 mm on 19/3 and is not significantly changed. Right paratracheal node measures 1.0 cm on 20/3 versus 1.1 cm on the prior exam (when remeasured). Multiple small retrocrural nodes including at maximally 7 mm on 53/3, similar. Lungs/Pleura: No pleural fluid. Mild centrilobular emphysema. Increase in right lower lobe pleural-based consolidation both posteriorly and laterally. Along the periphery of this, there are areas of "tree-in-bud" nodularity Posterior right upper lobe clustered calcified and noncalcified pulmonary nodules including at up to 5 mm on  73/4, similar and likely post infectious or inflammatory. Partially calcified soft tissue in the posterior left upper lobe with architectural distortion, including on 64/4, likely scarring. The right middle lobe presumed infection/inflammation on the prior exam is nearly completely resolved, with mild residual presumed scarring remaining on 01/20 8/4. Lingular calcified granuloma. Musculoskeletal: No acute osseous abnormality. CT ABDOMEN PELVIS FINDINGS Hepatobiliary: Normal liver. Normal gallbladder, without biliary ductal dilatation. Pancreas: Normal, without mass or ductal dilatation. Spleen: Normal in size, without focal abnormality. Adrenals/Urinary Tract: Normal adrenal glands. Interpolar 1.4 cm right renal cyst. Too small to characterize upper pole left renal lesion is also likely a cyst. No hydronephrosis. The bladder appears thick walled, favored to be due to underdistention. Stomach/Bowel: Proximal gastric underdistention. Normal colon, appendix, and terminal ileum. Normal small bowel. Vascular/Lymphatic: Aortic atherosclerosis. Abdominal retroperitoneal adenopathy. Example precaval 1.3 cm node on 79/3, 1.8 cm on the prior exam (when remeasured). A preaortic node measures 9 mm on 71/3 and is unchanged. Index small bowel mesenteric node measures 1.4 cm on 66/3 versus 1.2 cm on the prior exam (when remeasured). Right external iliac node measures 9 mm on 102/3 versus 8 mm previously. Reproductive: Normal prostate. Other: No significant free fluid. No free intraperitoneal air. No evidence of omental or peritoneal disease. Musculoskeletal: No acute osseous abnormality. IMPRESSION: 1. Overall similar adenopathy within the chest, abdomen, and pelvis. Some nodes measures slightly larger and others are slightly smaller. 2. Worsening right lower lobe airspace disease since 08/28/2019. Favored to represent acute on chronic infection or aspiration. Lymphomatous involvement felt less likely. 3. Aortic atherosclerosis  (ICD10-I70.0) and emphysema (ICD10-J43.9). Electronically Signed   By: Abigail Miyamoto M.D.   On: 09/27/2019 13:05   CT ABDOMEN PELVIS W CONTRAST  Result Date: 09/27/2019 CLINICAL DATA:  B-cell lymphoma. Weakness. Leukopenia. Fevers. Chest radiograph demonstrating lung opacities. EXAM: CT CHEST, ABDOMEN, AND PELVIS WITH CONTRAST TECHNIQUE: Multidetector CT imaging of the chest, abdomen and pelvis was performed following the standard protocol during bolus administration of intravenous contrast. CONTRAST:  137mL OMNIPAQUE IOHEXOL 300 MG/ML  SOLN COMPARISON:  Chest radiograph 09/25/2019. Prior chest abdomen pelvic CT of 08/28/2019. FINDINGS: CT CHEST FINDINGS Cardiovascular: Aortic atherosclerosis. Normal heart size, without pericardial effusion. No central pulmonary embolism, on this non-dedicated study. Mediastinum/Nodes: Multiple small bilateral low cervical nodes, incompletely evaluated. Example left low jugular 1.0 cm node on 02/03. Not imaged on the prior. Bilateral axillary adenopathy. Index deep left axillary node measures 1.3 cm on 15/3. Likely similar on the prior when measured in a similar fashion. Right axillary index node measures 8 mm on 19/3 and is not significantly changed. Right paratracheal node measures 1.0 cm on 20/3 versus 1.1 cm on  the prior exam (when remeasured). Multiple small retrocrural nodes including at maximally 7 mm on 53/3, similar. Lungs/Pleura: No pleural fluid. Mild centrilobular emphysema. Increase in right lower lobe pleural-based consolidation both posteriorly and laterally. Along the periphery of this, there are areas of "tree-in-bud" nodularity Posterior right upper lobe clustered calcified and noncalcified pulmonary nodules including at up to 5 mm on 73/4, similar and likely post infectious or inflammatory. Partially calcified soft tissue in the posterior left upper lobe with architectural distortion, including on 64/4, likely scarring. The right middle lobe presumed  infection/inflammation on the prior exam is nearly completely resolved, with mild residual presumed scarring remaining on 01/20 8/4. Lingular calcified granuloma. Musculoskeletal: No acute osseous abnormality. CT ABDOMEN PELVIS FINDINGS Hepatobiliary: Normal liver. Normal gallbladder, without biliary ductal dilatation. Pancreas: Normal, without mass or ductal dilatation. Spleen: Normal in size, without focal abnormality. Adrenals/Urinary Tract: Normal adrenal glands. Interpolar 1.4 cm right renal cyst. Too small to characterize upper pole left renal lesion is also likely a cyst. No hydronephrosis. The bladder appears thick walled, favored to be due to underdistention. Stomach/Bowel: Proximal gastric underdistention. Normal colon, appendix, and terminal ileum. Normal small bowel. Vascular/Lymphatic: Aortic atherosclerosis. Abdominal retroperitoneal adenopathy. Example precaval 1.3 cm node on 79/3, 1.8 cm on the prior exam (when remeasured). A preaortic node measures 9 mm on 71/3 and is unchanged. Index small bowel mesenteric node measures 1.4 cm on 66/3 versus 1.2 cm on the prior exam (when remeasured). Right external iliac node measures 9 mm on 102/3 versus 8 mm previously. Reproductive: Normal prostate. Other: No significant free fluid. No free intraperitoneal air. No evidence of omental or peritoneal disease. Musculoskeletal: No acute osseous abnormality. IMPRESSION: 1. Overall similar adenopathy within the chest, abdomen, and pelvis. Some nodes measures slightly larger and others are slightly smaller. 2. Worsening right lower lobe airspace disease since 08/28/2019. Favored to represent acute on chronic infection or aspiration. Lymphomatous involvement felt less likely. 3. Aortic atherosclerosis (ICD10-I70.0) and emphysema (ICD10-J43.9). Electronically Signed   By: Abigail Miyamoto M.D.   On: 09/27/2019 13:05    ASSESSMENT & PLAN:  [name] [name] is a 64 y.o. male with:  1. Small B-Cell Lymphoma /Chronic  lymphocytic Leukemia.  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 Newly noted Microcytic anemia with drop in hgb from 11.9 to 9.1 with severe iron deficiency. Not primarily related to lymphoma.  12/11/18 CT C/A/P which revealed "Interval progressive disease. Significant increase in adenopathy within the abdomen/pelvis. More mild enlargement of lymph nodes within the chest. 2. No splenomegaly. 3. Aortic Atherosclerosis."  05/15/2019 PET scan revealing "1. New focal nodular hypermetabolic left upper lobe pulmonary nodule, maximum SUV 12.2 compatible with Deauville 5 activity. There is also some worsening in Deauville 4 right upper paratracheal Adenopathy. 2. In the neck, there continue to be widespread enlarged lymph nodes similar to the prior exam, currently Deauville 3. 3. In the axillary regions, adenopathy is mildly reduced both in size and activity, currently Deauville 2.4. In the abdomen, bulky adenopathy appears mildly increased in size in some locations, but still in the Deauville 2 to Deauville 3 range."  2. Iron deficiency Anemia - unclear etiology -- will need GI workup - he notes that his GI workup was delayed due to COVID related scheduling issues . Today he was again asked to f/u with his GI doctor to have these scheduled.   PLAN: -Discussed pt labwork today, 10/09/19; WBC and PLT are nml, Hgb has improved, blood chemistries are stable, LDH is  WNL, Ferritin is well replaced, Iron at 31, Sat Ratios at 11 -Discussed 10/09/2019 Haptoglobin is WNL -Discussed 08/28/2019 CT C/A/P ([id number]) ([id number]) which revealed "Improving lymphadenopathy in the chest, abdomen, and pelvis. Dominant axillary nodes measure up to 10 mm, previously 2.2 cm. Dominant right common iliac node measures 1.8 cm, previously 4.0 cm. Prior left upper lobe/suprahilar nodular opacity has resolved. New patchy/nodular opacities in the right middle and lower lobes. While lymphomatous involvement is  possible, the appearance favors infection/inflammation. Spleen is normal in size." -Discussed 09/27/2019 CT C/A/P ([id number]) ([id number]) "1. Overall similar adenopathy within the chest, abdomen, and pelvis. Some nodes measures slightly larger and others are slightly smaller." -Advised pt that his HTN, HLD, and Diabetes could have played a role in causing his stroke.  -Discontinued Bendamustine + Rituxan after 2 cycles of treatment  -Based on labs and scans lymphoma is stable at this time -Will continue to watch lymphoma with clinic visits, will get rpt scans in 6-12 months  -Recommend pt focus on management of chronic diseases -Recommend pt f/u with Dr. Leonie Man as scheduled -Will see back in 3 months with labs   FOLLOW UP: RTC with Dr Irene Limbo with labs in 3 months   The total time spent in the appt was 30 minutes and more than 50% was on counseling and direct patient cares.  All of the patient's questions were answered with apparent satisfaction. The patient knows to call the clinic with any problems, questions or concerns.   Sullivan Lone MD [place] AAHIVMS [facility] Hematology/Oncology Physician [facility]  (Office):       [phone] (Work cell):  [phone] (Fax):           [phone]  10/09/2019 4:24 PM  I, Yevette Edwards, am acting as a scribe for Dr. Sullivan Lone.   .I have reviewed the above documentation for accuracy and completeness, and I agree with the above. Brunetta Genera MD

## 2019-10-10 ENCOUNTER — Encounter: Payer: Medicare Other | Attending: Physical Medicine & Rehabilitation | Admitting: Physical Medicine & Rehabilitation

## 2019-10-10 ENCOUNTER — Encounter: Payer: Self-pay | Admitting: Physical Medicine & Rehabilitation

## 2019-10-10 VITALS — BP 135/78 | HR 74 | Ht 67.0 in | Wt 154.0 lb

## 2019-10-10 DIAGNOSIS — C9110 Chronic lymphocytic leukemia of B-cell type not having achieved remission: Secondary | ICD-10-CM | POA: Diagnosis not present

## 2019-10-10 DIAGNOSIS — E871 Hypo-osmolality and hyponatremia: Secondary | ICD-10-CM

## 2019-10-10 DIAGNOSIS — R269 Unspecified abnormalities of gait and mobility: Secondary | ICD-10-CM

## 2019-10-10 DIAGNOSIS — I69354 Hemiplegia and hemiparesis following cerebral infarction affecting left non-dominant side: Secondary | ICD-10-CM

## 2019-10-10 DIAGNOSIS — R918 Other nonspecific abnormal finding of lung field: Secondary | ICD-10-CM

## 2019-10-10 DIAGNOSIS — I6350 Cerebral infarction due to unspecified occlusion or stenosis of unspecified cerebral artery: Secondary | ICD-10-CM

## 2019-10-10 DIAGNOSIS — C8330 Diffuse large B-cell lymphoma, unspecified site: Secondary | ICD-10-CM

## 2019-10-10 LAB — HAPTOGLOBIN: Haptoglobin: 311 mg/dL (ref 32–363)

## 2019-10-10 NOTE — Progress Notes (Signed)
Subjective:    Patient ID: [name] [name], male    DOB: [birth year], 64 y.o.   MRN: [id number]  TELEHEALTH NOTE  Due to national recommendations of social distancing due to COVID 19, an audio/video telehealth visit is felt to be most appropriate for this patient at this time.  See Chart message from today for the patient's consent to telehealth from [place].     I verified that I am speaking with the correct person using two identifiers.  Location of patient: Home Location of provider: Office Method of communication: Telephone Names of participants : Zorita Pang scheduling, Marland Mcalpine obtaining consent and vitals if available Established patient Time spent on call: 26 minutes  HPI Right-handed male with history of diabetes mellitus, chronic hyponatremia, hypertension, remote tobacco abuse, CLL a small B-cell lymphoma followed by Dr. Irene Limbo with last chemotherapy October 2020 presents for hospital follow-up after receiving CIR for acute right paramedian pontine infarct.  Admit date: 09/03/2019 Discharge date: 09/27/2018  At discharge, he was instructed to follow up with PCP, Hematology/Oncology, which he states he did. He states he saw ID as well.  He states he saw Neurology as well. He states he did lab work with other providers.  He states Path report was negative. He states he is upset that [facility] therapies have not been upset.  He states that he is going to speak with his lawyer if something is not done in the next couple of day and that I need to do my job. When offered to provide number, he states he does not have/cannot obtain a pen/paper to write down the number to [facility], speaking with someone in background.    Therapies: Has not been initiated Mobility: Wheelchair most of the time, walker occasionally when he feels strong DME: Bedside commode  Pain Inventory Average Pain 0 Pain Right Now 0 My pain is na  In the last 24 hours, has pain interfered  with the following? General activity 0 Relation with others 0 Enjoyment of life 0 What TIME of day is your pain at its worst? na Sleep (in general) Good  Pain is worse with: na Pain improves with: na Relief from Meds: na  Mobility use a walker ability to climb steps?  no do you drive?  no use a wheelchair  Function disabled: date disabled .  Neuro/Psych trouble walking  Prior Studies Any changes since last visit?  no  Physicians involved in your care Any changes since last visit?  no   Family History  Problem Relation Age of Onset  . Diabetes Mother   . Hypertension Mother   . Cancer Paternal Uncle    Social History   Socioeconomic History  . Marital status: Legally Separated    Spouse name: Not on file  . Number of children: 1  . Years of education: Not on file  . Highest education level: Not on file  Occupational History  . Not on file  Tobacco Use  . Smoking status: Former Smoker    Packs/day: 0.20    Years: 20.00    Pack years: 4.00    Types: Cigars    Quit date: 2014    Years since quitting: 7.1  . Smokeless tobacco: Never Used  Substance and Sexual Activity  . Alcohol use: Yes    Comment: 2 40oz beers a week  . Drug use: No  . Sexual activity: Not on file  Other Topics Concern  . Not on file  Social History Narrative  . Not on file   Social Determinants of Health   Financial Resource Strain:   . Difficulty of Paying Living Expenses: Not on file  Food Insecurity:   . Worried About Charity fundraiser in the Last Year: Not on file  . Ran Out of Food in the Last Year: Not on file  Transportation Needs:   . Lack of Transportation (Medical): Not on file  . Lack of Transportation (Non-Medical): Not on file  Physical Activity:   . Days of Exercise per Week: Not on file  . Minutes of Exercise per Session: Not on file  Stress:   . Feeling of Stress : Not on file  Social Connections:   . Frequency of Communication with Friends and Family:  Not on file  . Frequency of Social Gatherings with Friends and Family: Not on file  . Attends Religious Services: Not on file  . Active Member of Clubs or Organizations: Not on file  . Attends Archivist Meetings: Not on file  . Marital Status: Not on file   Past Surgical History:  Procedure Laterality Date  . bil wrist surgery    . BRONCHIAL BRUSHINGS  09/24/2019   Procedure: BRONCHIAL BRUSHINGS;  Surgeon: Candee Furbish, MD;  Location: [facility] ENDOSCOPY;  Service: Pulmonary;;  . BRONCHIAL WASHINGS  09/24/2019   Procedure: BRONCHIAL WASHINGS;  Surgeon: Candee Furbish, MD;  Location: [facility] ENDOSCOPY;  Service: Pulmonary;;  . ENDOSCOPIC CONCHA BULLOSA RESECTION Bilateral 05/18/2018   Procedure: ENDOSCOPIC CONCHA BULLOSA RESECTION;  Surgeon: Jerrell Belfast, MD;  Location: [place];  Service: ENT;  Laterality: Bilateral;  . NASAL SEPTOPLASTY W/ TURBINOPLASTY Bilateral 05/18/2018   Procedure: NASAL SEPTOPLASTY WITH TURBINATE REDUCTION;  Surgeon: Jerrell Belfast, MD;  Location: [place];  Service: ENT;  Laterality: Bilateral;  . ORIF FOREARM FRACTURE     right  . plastic surgery to face    . SINUS ENDO W/FUSION Bilateral 05/18/2018   Procedure: ENDOSCOPIC SINUS SURGERY WITH NAVIGATION;  Surgeon: Jerrell Belfast, MD;  Location: [place];  Service: ENT;  Laterality: Bilateral;  . VIDEO BRONCHOSCOPY N/A 09/24/2019   Procedure: VIDEO BRONCHOSCOPY WITHOUT FLUORO;  Surgeon: Candee Furbish, MD;  Location: [facility] ENDOSCOPY;  Service: Pulmonary;  Laterality: N/A;   Past Medical History:  Diagnosis Date  . Allergic rhinitis, cause unspecified   . Backache, unspecified   . Body mass index 33.0-33.9, adult   . Diabetes mellitus without complication ([place])    Type II  . Elevated blood pressure reading without diagnosis of hypertension   . Esophageal reflux   . Generalized pain   . Heartburn   . Insomnia, unspecified   . 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 Other  abnormal blood chemistry   . Other and unspecified hyperlipidemia   . Other dyspnea and respiratory abnormality   . Other nonspecific findings on examination of blood(790.99)   . Pain in joint, lower leg   . Pneumonia    1968  . Sleep apnea    Does not use or own a CPAP  . Tobacco use disorder   . Unspecified essential hypertension    BP 135/78   Pulse 74   Ht 5\' 7"  (1.702 m)   Wt 154 lb (69.9 kg)   BMI 24.12 kg/m   Opioid Risk Score:   Fall Risk Score:  `1  Depression screen PHQ 2/9  No flowsheet data found.   Review of Systems  Constitutional:  Negative.   HENT: Negative.   Eyes: Negative.   Respiratory: Negative.   Cardiovascular: Negative.   Gastrointestinal: Negative.   Endocrine: Negative.   Genitourinary: Negative.   Musculoskeletal: Positive for gait problem.  Skin: Negative.   Allergic/Immunologic: Negative.   Hematological: Negative.   Psychiatric/Behavioral: Negative.   All other systems reviewed and are negative.      Objective:   Physical Exam Constitutional: No distress  Respiratory: Normal effort. Psych: Agitated   Neuro: Alert    Assessment & Plan:  Right-handed male with history of diabetes mellitus, chronic hyponatremia, hypertension, remote tobacco abuse, CLL a small B-cell lymphoma followed by Dr. Irene Limbo with last chemotherapy October 2020 presents for hospital follow-up after receiving CIR for acute right paramedian pontine infarct.  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  As patient states he does not and cannot obtain pen/paper, instructed patient to call office when possible to obtain phone number for services.  Cont follow up with Neuro  2. Mood:Provide emotional support  Does not feel Ritalin is helping, will  consider d/cing after initiation of therapies if no benefit medication was beneficial during hospitalization.  -antipsychotic agents: N/A  3. CLL/small B-cell lymphoma.   Cont follow up with Dr.Kale/Heme/Onc.   4. Mild Spasticity:             He notes improvement  5. Fever: Resolved per patient             Bronchoscopy performed on 2/3, path report suggesting inflammation  6. Gait abnormality  Cont wheelchair/walker for safety  Follow up with [facility] therapies  7. Chronic hyponatremia  Na 133 on 2/17

## 2019-10-26 LAB — FUNGUS CULTURE WITH STAIN

## 2019-10-26 LAB — FUNGUS CULTURE RESULT

## 2019-10-26 LAB — FUNGAL ORGANISM REFLEX

## 2019-10-29 ENCOUNTER — Inpatient Hospital Stay: Payer: Self-pay | Admitting: Neurology

## 2019-10-30 ENCOUNTER — Telehealth: Payer: Self-pay

## 2019-10-30 NOTE — Telephone Encounter (Signed)
COVID-19 Pre-Screening Questions:10/30/19  Do you currently have a fever (>100 F), chills or unexplained body aches? NO   Are you currently experiencing new cough, shortness of breath, sore throat, runny nose? NO .  Have you recently travelled outside the state of [place] in the last 14 days? NO  .  Have you been in contact with someone that is currently pending confirmation of Covid19 testing or has been confirmed to have the [place] virus?  NO  **If the patient answers NO to ALL questions -  advise the patient to please call the clinic before coming to the office should any symptoms develop.

## 2019-10-31 ENCOUNTER — Ambulatory Visit: Payer: Medicare Other | Admitting: Infectious Diseases

## 2019-11-05 ENCOUNTER — Ambulatory Visit: Payer: Medicare Other | Admitting: Infectious Diseases

## 2019-11-06 ENCOUNTER — Ambulatory Visit (INDEPENDENT_AMBULATORY_CARE_PROVIDER_SITE_OTHER): Payer: Medicare Other | Admitting: Infectious Diseases

## 2019-11-06 ENCOUNTER — Other Ambulatory Visit: Payer: Self-pay

## 2019-11-06 ENCOUNTER — Encounter: Payer: Self-pay | Admitting: Infectious Diseases

## 2019-11-06 VITALS — Ht 67.0 in | Wt 162.0 lb

## 2019-11-06 DIAGNOSIS — Z09 Encounter for follow-up examination after completed treatment for conditions other than malignant neoplasm: Secondary | ICD-10-CM | POA: Diagnosis not present

## 2019-11-06 NOTE — Progress Notes (Signed)
Virtual Visit via Telephone Note  I connected with WADSWORTH URBANEK on 11/06/19 at  4:15 PM EDT by telephone and verified that I am speaking with the correct person using two identifiers.  Location: Patient: Home  Provider: [facility]    I discussed the limitations, risks, security and privacy concerns of performing an evaluation and management service by telephone and the availability of in person appointments. I also discussed with the patient that there may be a patient responsible charge related to this service. The patient expressed understanding and agreed to proceed.   History of Present Illness: Feeling better since discharge from the hospital rehab team. He denies any trouble with cough, sputum production, shortness of breath, fevers, chills or chest pain.   LOV with Dr. Irene Limbo for CLL was 2/17 and he would like to only monitor at this time. He tells me he would not entertain chemotherapy again in the future as the side effects were "tough to handle."     Observations/Objective: Physical Exam   Assessment and Plan: Abnormal Chest CT concerning for atypical infectious process vs aspiration in the setting of known CLL patient previously on chemotherapy with low grade fevers during hospital stay for recent stroke.   AFB smear / cultures are negative   Fungal cultures are negative   Patient is currently without any concerning infectious symptoms at this time (although I do wonder a bit if he is downplaying).  At this time would recommend ongoing monitoring of symptoms, in particular emergence of any respiratory symptoms. Scheduled for routine scans for follow up of CLL per Dr. Irene Limbo    Follow Up Instructions: Follow up with Oncology, PCP and Neurology    I discussed the assessment and treatment plan with the patient. The patient was provided an opportunity to ask questions and all were answered. The patient agreed with the plan and demonstrated an understanding of the  instructions.   The patient was advised to call back or seek an in-person evaluation if the symptoms worsen or if the condition fails to improve as anticipated.  I provided 5 minutes of non-face-to-face time during this encounter.   Janene Madeira, NP

## 2019-11-07 ENCOUNTER — Other Ambulatory Visit: Payer: Self-pay | Admitting: Neurology

## 2019-11-07 DIAGNOSIS — I639 Cerebral infarction, unspecified: Secondary | ICD-10-CM

## 2019-11-07 LAB — ACID FAST CULTURE WITH REFLEXED SENSITIVITIES (MYCOBACTERIA)
Acid Fast Culture: NEGATIVE
Acid Fast Culture: NEGATIVE

## 2019-11-19 ENCOUNTER — Other Ambulatory Visit: Payer: Self-pay

## 2019-11-21 ENCOUNTER — Encounter: Payer: Medicare Other | Attending: Physical Medicine & Rehabilitation | Admitting: Physical Medicine & Rehabilitation

## 2019-11-26 ENCOUNTER — Ambulatory Visit: Payer: Medicare Other | Admitting: Gastroenterology

## 2019-12-03 NOTE — Progress Notes (Signed)
Hi, Dr. Leonie Man, you may have to sign the MRI order for the Bayer study requirement. It was sent to me but I think you will be more appropriate to sign it. Thanks. - Bern Fare

## 2019-12-04 NOTE — Progress Notes (Signed)
Ok will do.

## 2019-12-08 ENCOUNTER — Ambulatory Visit (HOSPITAL_COMMUNITY): Admission: RE | Admit: 2019-12-08 | Payer: Medicare Other | Source: Ambulatory Visit

## 2019-12-09 ENCOUNTER — Emergency Department (HOSPITAL_COMMUNITY): Payer: Medicare Other

## 2019-12-09 ENCOUNTER — Other Ambulatory Visit: Payer: Self-pay

## 2019-12-09 ENCOUNTER — Inpatient Hospital Stay (HOSPITAL_COMMUNITY): Payer: Medicare Other

## 2019-12-09 ENCOUNTER — Inpatient Hospital Stay (HOSPITAL_COMMUNITY)
Admission: EM | Admit: 2019-12-09 | Discharge: 2019-12-12 | DRG: 065 | Disposition: A | Discharge status: Skilled Nursing Facility | Payer: Medicare Other | Attending: Internal Medicine | Admitting: Internal Medicine

## 2019-12-09 DIAGNOSIS — I639 Cerebral infarction, unspecified: Secondary | ICD-10-CM | POA: Diagnosis present

## 2019-12-09 DIAGNOSIS — G8194 Hemiplegia, unspecified affecting left nondominant side: Secondary | ICD-10-CM | POA: Diagnosis present

## 2019-12-09 DIAGNOSIS — R29708 NIHSS score 8: Secondary | ICD-10-CM | POA: Diagnosis present

## 2019-12-09 DIAGNOSIS — Z9221 Personal history of antineoplastic chemotherapy: Secondary | ICD-10-CM | POA: Diagnosis not present

## 2019-12-09 DIAGNOSIS — Z833 Family history of diabetes mellitus: Secondary | ICD-10-CM

## 2019-12-09 DIAGNOSIS — D509 Iron deficiency anemia, unspecified: Secondary | ICD-10-CM | POA: Diagnosis present

## 2019-12-09 DIAGNOSIS — I10 Essential (primary) hypertension: Secondary | ICD-10-CM | POA: Diagnosis present

## 2019-12-09 DIAGNOSIS — I63311 Cerebral infarction due to thrombosis of right middle cerebral artery: Secondary | ICD-10-CM | POA: Diagnosis not present

## 2019-12-09 DIAGNOSIS — F17210 Nicotine dependence, cigarettes, uncomplicated: Secondary | ICD-10-CM | POA: Diagnosis present

## 2019-12-09 DIAGNOSIS — E876 Hypokalemia: Secondary | ICD-10-CM | POA: Diagnosis present

## 2019-12-09 DIAGNOSIS — Z20822 Contact with and (suspected) exposure to covid-19: Secondary | ICD-10-CM | POA: Diagnosis present

## 2019-12-09 DIAGNOSIS — C9110 Chronic lymphocytic leukemia of B-cell type not having achieved remission: Secondary | ICD-10-CM | POA: Diagnosis present

## 2019-12-09 DIAGNOSIS — R29810 Facial weakness: Secondary | ICD-10-CM | POA: Diagnosis present

## 2019-12-09 DIAGNOSIS — I63511 Cerebral infarction due to unspecified occlusion or stenosis of right middle cerebral artery: Principal | ICD-10-CM | POA: Diagnosis present

## 2019-12-09 DIAGNOSIS — K219 Gastro-esophageal reflux disease without esophagitis: Secondary | ICD-10-CM | POA: Diagnosis present

## 2019-12-09 DIAGNOSIS — R531 Weakness: Secondary | ICD-10-CM | POA: Diagnosis present

## 2019-12-09 DIAGNOSIS — R4701 Aphasia: Secondary | ICD-10-CM | POA: Diagnosis present

## 2019-12-09 DIAGNOSIS — G4733 Obstructive sleep apnea (adult) (pediatric): Secondary | ICD-10-CM | POA: Diagnosis present

## 2019-12-09 DIAGNOSIS — E7800 Pure hypercholesterolemia, unspecified: Secondary | ICD-10-CM | POA: Diagnosis not present

## 2019-12-09 DIAGNOSIS — E785 Hyperlipidemia, unspecified: Secondary | ICD-10-CM | POA: Diagnosis present

## 2019-12-09 DIAGNOSIS — C8330 Diffuse large B-cell lymphoma, unspecified site: Secondary | ICD-10-CM | POA: Diagnosis present

## 2019-12-09 DIAGNOSIS — E119 Type 2 diabetes mellitus without complications: Secondary | ICD-10-CM | POA: Diagnosis present

## 2019-12-09 DIAGNOSIS — Z7902 Long term (current) use of antithrombotics/antiplatelets: Secondary | ICD-10-CM

## 2019-12-09 DIAGNOSIS — E1159 Type 2 diabetes mellitus with other circulatory complications: Secondary | ICD-10-CM | POA: Diagnosis not present

## 2019-12-09 LAB — COMPREHENSIVE METABOLIC PANEL
ALT: 9 U/L (ref 0–44)
AST: 9 U/L — ABNORMAL LOW (ref 15–41)
Albumin: 3.7 g/dL (ref 3.5–5.0)
Alkaline Phosphatase: 67 U/L (ref 38–126)
Anion gap: 9 (ref 5–15)
BUN: 6 mg/dL — ABNORMAL LOW (ref 8–23)
CO2: 25 mmol/L (ref 22–32)
Calcium: 9.6 mg/dL (ref 8.9–10.3)
Chloride: 104 mmol/L (ref 98–111)
Creatinine, Ser: 0.8 mg/dL (ref 0.61–1.24)
GFR calc Af Amer: >60 mL/min (ref >60)
GFR calc non Af Amer: >60 mL/min (ref >60)
Glucose, Bld: 104 mg/dL — ABNORMAL HIGH (ref 70–99)
Potassium: 3.2 mmol/L — ABNORMAL LOW (ref 3.5–5.1)
Sodium: 138 mmol/L (ref 135–145)
Total Bilirubin: 1.8 mg/dL — ABNORMAL HIGH (ref 0.3–1.2)
Total Protein: 6.8 g/dL (ref 6.5–8.1)

## 2019-12-09 LAB — CBC WITH DIFFERENTIAL/PLATELET
Abs Immature Granulocytes: 0.04 10*3/uL (ref 0.00–0.07)
Basophils Absolute: 0 10*3/uL (ref 0.0–0.1)
Basophils Relative: 0 %
Eosinophils Absolute: 0.1 10*3/uL (ref 0.0–0.5)
Eosinophils Relative: 1 %
HCT: 32.2 % — ABNORMAL LOW (ref 39.0–52.0)
Hemoglobin: 10.9 g/dL — ABNORMAL LOW (ref 13.0–17.0)
Immature Granulocytes: 1 %
Lymphocytes Relative: 8 %
Lymphs Abs: 0.5 10*3/uL — ABNORMAL LOW (ref 0.7–4.0)
MCH: 25.3 pg — ABNORMAL LOW (ref 26.0–34.0)
MCHC: 33.9 g/dL (ref 30.0–36.0)
MCV: 74.9 fL — ABNORMAL LOW (ref 80.0–100.0)
Monocytes Absolute: 0.6 10*3/uL (ref 0.1–1.0)
Monocytes Relative: 10 %
Neutro Abs: 4.8 10*3/uL (ref 1.7–7.7)
Neutrophils Relative %: 80 %
Platelets: 320 10*3/uL (ref 150–400)
RBC: 4.3 MIL/uL (ref 4.22–5.81)
RDW: 16.2 % — ABNORMAL HIGH (ref 11.5–15.5)
WBC: 6 10*3/uL (ref 4.0–10.5)
nRBC: 0 % (ref 0.0–0.2)

## 2019-12-09 LAB — IRON AND TIBC
Iron: 37 ug/dL — ABNORMAL LOW (ref 45–182)
Saturation Ratios: 12 % — ABNORMAL LOW (ref 17.9–39.5)
TIBC: 312 ug/dL (ref 250–450)
UIBC: 275 ug/dL

## 2019-12-09 LAB — MAGNESIUM: Magnesium: 1.8 mg/dL (ref 1.7–2.4)

## 2019-12-09 LAB — SARS CORONAVIRUS 2 (TAT 6-24 HRS): SARS Coronavirus 2: NEGATIVE

## 2019-12-09 LAB — FERRITIN: Ferritin: 200 ng/mL (ref 24–336)

## 2019-12-09 MED ORDER — ENOXAPARIN SODIUM 40 MG/0.4ML ~~LOC~~ SOLN
40.0000 mg | SUBCUTANEOUS | Status: DC
  Administered 2019-12-09: 40 mg via SUBCUTANEOUS
  Filled 2019-12-09: qty 0.4

## 2019-12-09 MED ORDER — IOHEXOL 350 MG/ML SOLN: 75.0000 mL | Freq: Once | INTRAVENOUS | Status: DC | PRN

## 2019-12-09 MED ORDER — IOHEXOL 350 MG/ML SOLN
75.0000 mL | Freq: Once | INTRAVENOUS | Status: AC | PRN
  Administered 2019-12-09: 75 mL via INTRAVENOUS

## 2019-12-09 MED ORDER — ACETAMINOPHEN 650 MG RE SUPP: 650.0000 mg | RECTAL | Status: DC | PRN

## 2019-12-09 MED ORDER — ACETAMINOPHEN 325 MG PO TABS: 650.0000 mg | ORAL_TABLET | ORAL | Status: DC | PRN

## 2019-12-09 MED ORDER — STUDY - INVESTIGATIONAL MEDICATION
1.0000 | Freq: Every day | Status: DC
  Filled 2019-12-09: qty 1

## 2019-12-09 MED ORDER — POTASSIUM CHLORIDE 20 MEQ PO PACK
40.0000 meq | PACK | Freq: Once | ORAL | Status: AC
  Administered 2019-12-09: 40 meq via ORAL
  Filled 2019-12-09: qty 2

## 2019-12-09 MED ORDER — NON FORMULARY: 1.0000 | Freq: Every day | Status: DC

## 2019-12-09 MED ORDER — STROKE: EARLY STAGES OF RECOVERY BOOK
Freq: Once | Status: DC
  Filled 2019-12-09: qty 1

## 2019-12-09 MED ORDER — ACETAMINOPHEN 160 MG/5ML PO SOLN: 650.0000 mg | ORAL | Status: DC | PRN

## 2019-12-09 MED ORDER — ATORVASTATIN CALCIUM 80 MG PO TABS
80.0000 mg | ORAL_TABLET | Freq: Every day | ORAL | Status: DC
  Administered 2019-12-09 – 2019-12-10 (×2): 80 mg via ORAL
  Filled 2019-12-09 (×2): qty 1

## 2019-12-09 MED ORDER — SENNOSIDES-DOCUSATE SODIUM 8.6-50 MG PO TABS: 1.0000 | ORAL_TABLET | Freq: Every evening | ORAL | Status: DC | PRN

## 2019-12-09 NOTE — ED Notes (Signed)
Etta Quill from neurology at bedside. Stated to cancel the CT

## 2019-12-09 NOTE — ED Triage Notes (Signed)
Pt bib from home with increased weakness to L side and difficulty speaking X3 days. Pt normally able to ambulate with little assistance, unable to walk for the last 3 days. Pt with hx cva with L sided deficits, however, pt states he does feel more weak on the left. Pt fell at home within the last week, unsure if he hit his head.  153/78 HR 73 99% RA CBG 138

## 2019-12-09 NOTE — ED Notes (Signed)
Report given to Hannie RN °

## 2019-12-09 NOTE — ED Provider Notes (Signed)
[facility] EMERGENCY DEPARTMENT Provider Note   CSN: [id number] Arrival date & time: 12/09/19  1317     History Chief Complaint  Patient presents with  . Stroke Symptoms    [name] [name] is a 64 y.o. male.  HPI Patient presents with left-sided weakness and some difficulty speaking.  Has had a previous stroke with chronic left-sided weakness and difficulty ambulatory baseline.  Apparently has been more weak over the last 3 days.  Unable to walk although it somewhat difficult to determine what she walks at baseline.  Did have a fall.  No fevers.  No chest pain.  No cough.  Does seem to have a little difficulty getting the words out.  Did have stroke back in January.  Denies dysuria.    Past Medical History:  Diagnosis Date  . Allergic rhinitis, cause unspecified   . Backache, unspecified   . Body mass index 33.0-33.9, adult   . Diabetes mellitus without complication ([place])    Type II  . Elevated blood pressure reading without diagnosis of hypertension   . Esophageal reflux   . Generalized pain   . Heartburn   . Insomnia, unspecified   . 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 Other abnormal blood chemistry   . Other and unspecified hyperlipidemia   . Other dyspnea and respiratory abnormality   . Other nonspecific findings on examination of blood(790.99)   . Pain in joint, lower leg   . Pneumonia    1968  . Sleep apnea    Does not use or own a CPAP  . Tobacco use disorder   . Unspecified essential hypertension     Patient Active Problem List   Diagnosis Date Noted  . Abnormality of gait 10/10/2019  . Temperature elevated   . Labile blood pressure   . 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 Sleep disturbance   . History of hypertension   . Opacity of lung on imaging study   . Fever   . Reactive depression   . Benign essential HTN   . Labile blood glucose   . Acute on chronic anemia   . Lymphopenia     . Hypoalbuminemia due to protein-calorie malnutrition ([place])   . Controlled type 2 diabetes mellitus with hyperglycemia, without long-term current use of insulin ([place])   . 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 Acute ischemic stroke ([place]) 08/29/2019  . Hyponatremia 08/29/2019  . HTN (hypertension) 08/29/2019  . DM2 (diabetes mellitus, type 2) ([place]) 08/29/2019  . CLL (chronic lymphocytic leukemia) ([place]) 04/10/2019  . Counseling regarding advance care planning and goals of care 04/10/2019  . Deviated septum 05/18/2018  . Iron deficiency anemia 02/26/2018    Past Surgical History:  Procedure Laterality Date  . bil wrist surgery    . BRONCHIAL BRUSHINGS  09/24/2019   Procedure: BRONCHIAL BRUSHINGS;  Surgeon: Candee Furbish, MD;  Location: [facility] ENDOSCOPY;  Service: Pulmonary;;  . BRONCHIAL WASHINGS  09/24/2019   Procedure: BRONCHIAL WASHINGS;  Surgeon: Candee Furbish, MD;  Location: [facility] ENDOSCOPY;  Service: Pulmonary;;  . ENDOSCOPIC CONCHA BULLOSA RESECTION Bilateral 05/18/2018   Procedure: ENDOSCOPIC CONCHA BULLOSA RESECTION;  Surgeon: Jerrell Belfast, MD;  Location: [facility];  Service: ENT;  Laterality: Bilateral;  . NASAL SEPTOPLASTY W/ TURBINOPLASTY Bilateral 05/18/2018   Procedure: NASAL SEPTOPLASTY WITH TURBINATE REDUCTION;  Surgeon: Jerrell Belfast, MD;  Location: [facility];  Service: ENT;  Laterality: Bilateral;  . ORIF FOREARM FRACTURE  right  . plastic surgery to face    . SINUS ENDO W/FUSION Bilateral 05/18/2018   Procedure: ENDOSCOPIC SINUS SURGERY WITH NAVIGATION;  Surgeon: Jerrell Belfast, MD;  Location: [facility];  Service: ENT;  Laterality: Bilateral;  . VIDEO BRONCHOSCOPY N/A 09/24/2019   Procedure: VIDEO BRONCHOSCOPY WITHOUT FLUORO;  Surgeon: Candee Furbish, MD;  Location: [facility] ENDOSCOPY;  Service: Pulmonary;  Laterality: N/A;       Family History  Problem Relation Age of Onset  . Diabetes Mother   . Hypertension Mother   . Cancer Paternal Uncle     Social History   Tobacco  Use  . Smoking status: Former Smoker    Packs/day: 0.20    Years: 20.00    Pack years: 4.00    Types: Cigars    Quit date: 2014    Years since quitting: 7.3  . Smokeless tobacco: Never Used  Substance Use Topics  . Alcohol use: Not Currently    Comment: 2 40oz beers a week  . Drug use: No    Home Medications Prior to Admission medications   Medication Sig Start Date End Date Taking? Authorizing Provider  atorvastatin (LIPITOR) 20 MG tablet Take 1 tablet (20 mg total) by mouth daily. 09/27/19  Yes Angiulli, Lavon Paganini, PA-C  clopidogrel (PLAVIX) 75 MG tablet Take 1 tablet (75 mg total) by mouth daily. 09/27/19  Yes Angiulli, Lavon Paganini, PA-C  Investigational - Study Medication Take 1 mg by mouth every morning. Study name:  Additional study details: 09/27/19  Yes Angiulli, Lavon Paganini, PA-C  Investigational - Study Medication Take 1 mg by mouth every morning. Study name: as per pharmacy Additional study details: 09/27/19  Yes Angiulli, Lavon Paganini, PA-C  methylphenidate (RITALIN) 5 MG tablet Take 1 tablet (5 mg total) by mouth daily. 09/27/19  Yes Angiulli, Lavon Paganini, PA-C  oxyCODONE-acetaminophen (PERCOCET/ROXICET) 5-325 MG tablet Take 2 tablets by mouth every 6 (six) hours as needed (for pain).  10/31/19  Yes [provider]  potassium chloride SA (KLOR-CON) 20 MEQ tablet Take 20 mEq by mouth daily. 11/17/19  Yes [provider]  acetaminophen (TYLENOL) 325 MG tablet Take 2 tablets (650 mg total) by mouth every 4 (four) hours as needed for mild pain (or temp > 37.5 C (99.5 F)). Patient not taking: Reported on 12/09/2019 09/27/19   Angiulli, Lavon Paganini, PA-C  pantoprazole (PROTONIX) 40 MG tablet Take 1 tablet (40 mg total) by mouth at bedtime. Patient not taking: Reported on 12/09/2019 09/27/19   Cathlyn Parsons, PA-C    Allergies    Lisinopril and Tuberculin tests  Review of Systems   Review of Systems  Constitutional: Negative for appetite change.  Respiratory: Negative for shortness of  breath.   Gastrointestinal: Negative for abdominal pain.  Genitourinary: Negative for flank pain.  Musculoskeletal: Negative for back pain.  Neurological: Positive for speech difficulty and weakness.  Psychiatric/Behavioral: Negative for confusion.    Physical Exam Updated Vital Signs BP (!) 141/83   Pulse 64   Temp 98.4 F (36.9 C) (Oral)   Resp (!) 21   SpO2 98%   Physical Exam Vitals reviewed.  HENT:     Head: Atraumatic.  Eyes:     Pupils: Pupils are equal, round, and reactive to light.  Cardiovascular:     Rate and Rhythm: Regular rhythm.  Abdominal:     Tenderness: There is no abdominal tenderness.  Musculoskeletal:        General: No tenderness.  Cervical back: Neck supple.  Skin:    General: Skin is warm.  Neurological:     Mental Status: He is alert.     Comments: Awake and answers question but does appear to have some difficulty with word finding.Chronic weakness on left side.  Does have some contractions on the left.  Appears to have left-sided facial droop also.     ED Results / Procedures / Treatments   Labs (all labs ordered are listed, but only abnormal results are displayed) Labs Reviewed  COMPREHENSIVE METABOLIC PANEL  CBC WITH DIFFERENTIAL/PLATELET  URINALYSIS, ROUTINE W REFLEX MICROSCOPIC    EKG None  Radiology MR BRAIN WO CONTRAST  Result Date: 12/09/2019 CLINICAL DATA:  Focal neuro deficit, greater than 6 hours, stroke suspected. Additional history provided: Increased weakness to left side and difficulty speaking for 3 days, unable to walk for the past 3 days, history of prior CVA with residual left-sided deficits. EXAM: MRI HEAD WITHOUT CONTRAST TECHNIQUE: Multiplanar, multiecho pulse sequences of the brain and surrounding structures were obtained without intravenous contrast. COMPARISON:  Non-contrast head CT 09/13/2019, brain MRI 08/30/2019 FINDINGS: Brain: The exam is intermittently motion degraded. Most notably, there is moderate  motion degradation of the sagittal T1 weighted sequence. There is a focus of restricted diffusion within the right basal ganglia which involves the right caudate and lentiform nuclei as well as internal capsule, measuring 2.6 x 2.0 x 2.9 cm (series 3, images 20 3-33). Findings are consistent with acute infarction. Corresponding T2/FLAIR hyperintensity at this site. No evidence of acute infarct elsewhere within the brain. Redemonstrated chronic lacunar infarct within the paramedian right pontomedullary junction (series 6, image 7). Small chronic lacunar infarct within the left basal ganglia involving the left caudate and lentiform nuclei. Chronic lacunar infarct within the right thalamus. Additional chronic lacunar infarcts within the right cerebellar white matter. These infarcts were not present on prior MRI 08/30/2019. There is no evidence of intracranial mass. No midline shift or extra-axial fluid collection. No chronic intracranial blood products. Cerebral volume is normal. Vascular: Flow voids maintained within the proximal large arterial vessels. Skull and upper cervical spine: No focal marrow lesion. Incompletely assessed upper cervical spondylosis. Sinuses/Orbits: Susceptibility artifact arising from surgical fixation hardware along the lateral left orbit. No acute orbital abnormality is demonstrated. Mild paranasal sinus mucosal thickening. Trace fluid within inferior left mastoid air cells. IMPRESSION: 2.9 cm acute infarct within the right basal ganglia involving the right caudate and lentiform nuclei, as well as internal capsule. Chronic lacunar infarcts within the left basal ganglia, right thalamus and right cerebellar white matter. These infarcts were not present on prior MRI 09/13/2019. Redemonstrated chronic lacunar infarct within the paramedian right pontomedullary junction. Electronically Signed   By: Kellie Simmering DO   On: 12/09/2019 15:09   DG Chest Portable 1 View  Result Date:  12/09/2019 CLINICAL DATA:  Left-sided weakness and dysarthria for the past 3 days. EXAM: PORTABLE CHEST 1 VIEW COMPARISON:  09/25/2019 FINDINGS: The cardiac silhouette remains borderline enlarged. Clear lungs with normal vascularity. Thoracic spine degenerative changes. IMPRESSION: No acute abnormality. Electronically Signed   By: Claudie Revering M.D.   On: 12/09/2019 14:24    Procedures Procedures (including critical care time)  Medications Ordered in ED Medications - No data to display  ED Course  I have reviewed the triage vital signs and the nursing notes.  Pertinent labs & imaging results that were available during my care of the patient were reviewed by me and considered in my medical  decision making (see chart for details).    MDM Rules/Calculators/A&P                      Patient acute on chronic left-sided weakness with some difficulty speaking.  Stroke around 3 months ago.  Will get work-up to look for infection and will get CT and MRI.  Discussed with Dr. Rory Percy.  If MRI negative likely be able to go home.  If MRI positive will discuss with him.  MRI shows acute basal ganglia stroke on the right side.  Could get the left-sided symptoms.  With this will admit to unassigned medicine.  Neurology aware.  Lab work still pending at this time Final Clinical Impression(s) / ED Diagnoses Final diagnoses:  Cerebrovascular accident (CVA), unspecified mechanism ([place])    Rx / [place] Orders ED Discharge Orders    None       Davonna Belling, MD 12/09/19 [phone]

## 2019-12-09 NOTE — ED Notes (Signed)
[id number] son would like an update

## 2019-12-09 NOTE — Consult Note (Signed)
Neurology Consultation  Reason for Consult: Left-sided weakness and stroke Referring Physician: Dr. Davonna Belling  CC: Left-sided weakness and difficulty speaking  History is obtained from: Patient  HPI: [name] [name] is a 64 y.o. male with history of essential hypertension, tobacco abuse, neck pain, previous stroke.  Patient came to the emergency department after having 3 days of increased weakness on the left side.  MRI revealed stroke thus neurology was consulted.  Talking to the son he had a fall one week ago. Three days ago his son noted he was not as talkative and his affect was flat. He was also weaker on the left. For this reason he was brought to the hospital.   Patient is unable to provide any information other than "he was brought to the hospital because his son brought him here."  ED course   MRI brain shows-2.9 cm acute infarct within the right basal ganglia involving the right caudate and lenticular form nuclei.  It also involves internal capsule.  Chronic lacunar infarcts within the left basal ganglia, right thalamus and right cerebellar white matter.  Chart review (patient was seen on 08/29/2019 for stroke and [facility], ED.  At that time he stated he was having difficulty walking, left-sided weakness.  At that time his NIH stroke scale was 6 and he was out of the window for TPA. CT angio head neck showed no emergent large vessel occlusion or high-grade stenosis of the intracranial arteries.  CT head was unremarkable.  MRI brain showed acute infarct of the right paramedian pons.  HbA1c was 5.9 and patient's LDL was 85.  Echocardiogram at that time showed normal ejection fraction with no cardiac source of emboli.  Patient was enrolled in the [employer] factor XI inhibitor stroke prevention trial.  LKW: 3 days ago tpa given?: no, out of window Premorbid modified Rankin scale (mRS): 3 NIH stroke scale-8    Past Medical History:  Diagnosis Date  . Allergic rhinitis,  cause unspecified   . Backache, unspecified   . Body mass index 33.0-33.9, adult   . Diabetes mellitus without complication ([place])    Type II  . Elevated blood pressure reading without diagnosis of hypertension   . Esophageal reflux   . Generalized pain   . Heartburn   . Insomnia, unspecified   . 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 Other abnormal blood chemistry   . Other and unspecified hyperlipidemia   . Other dyspnea and respiratory abnormality   . Other nonspecific findings on examination of blood(790.99)   . Pain in joint, lower leg   . Pneumonia    1968  . Sleep apnea    Does not use or own a CPAP  . Tobacco use disorder   . Unspecified essential hypertension     Family History  Problem Relation Age of Onset  . Diabetes Mother   . Hypertension Mother   . Cancer Paternal Uncle    Social History:   reports that he quit smoking about 7 years ago. His smoking use included cigars. He has a 4.00 pack-year smoking history. He has never used smokeless tobacco. He reports previous alcohol use. He reports that he does not use drugs.  Medications No current facility-administered medications for this encounter.  Current Outpatient Medications:  .  atorvastatin (LIPITOR) 20 MG tablet, Take 1 tablet (20 mg total) by mouth daily., Disp: 30 tablet, Rfl: 0 .  clopidogrel (PLAVIX) 75 MG tablet, Take 1 tablet (  75 mg total) by mouth daily., Disp: 30 tablet, Rfl: 0 .  Investigational - Study Medication, Take 1 mg by mouth every morning. Study name:  Additional study details:, Disp: 1 each, Rfl: PRN .  Investigational - Study Medication, Take 1 mg by mouth every morning. Study name: as per pharmacy Additional study details:, Disp: 1 each, Rfl: PRN .  methylphenidate (RITALIN) 5 MG tablet, Take 1 tablet (5 mg total) by mouth daily., Disp: 30 tablet, Rfl: 0 .  oxyCODONE-acetaminophen (PERCOCET/ROXICET) 5-325 MG tablet, Take 2 tablets by mouth  every 6 (six) hours as needed (for pain). , Disp: , Rfl:  .  potassium chloride SA (KLOR-CON) 20 MEQ tablet, Take 20 mEq by mouth daily., Disp: , Rfl:  .  acetaminophen (TYLENOL) 325 MG tablet, Take 2 tablets (650 mg total) by mouth every 4 (four) hours as needed for mild pain (or temp > 37.5 C (99.5 F)). (Patient not taking: Reported on 12/09/2019), Disp:  , Rfl:  .  pantoprazole (PROTONIX) 40 MG tablet, Take 1 tablet (40 mg total) by mouth at bedtime. (Patient not taking: Reported on 12/09/2019), Disp: 30 tablet, Rfl: 0  ROS:  Unable to obtain due to his drowsiness and inability to concentrate on questions  Exam: Current vital signs: BP (!) 141/83   Pulse 64   Temp 98.4 F (36.9 C) (Oral)   Resp (!) 21   SpO2 98%  Vital signs in last 24 hours: Temp:  [98.4 F (36.9 C)] 98.4 F (36.9 C) (04/19 1344) Pulse Rate:  [63-76] 64 (04/19 1400) Resp:  [21-22] 21 (04/19 1400) BP: (137-141)/(80-83) 141/83 (04/19 1400) SpO2:  [97 %-99 %] 98 % (04/19 1400)   Constitutional: Appears well-developed and well-nourished.  Eyes: No scleral injection HENT: No OP obstrucion Head: Normocephalic.  Cardiovascular: Normal rate and regular rhythm.  Respiratory: Effort normal, non-labored breathing GI: Soft.  No distension. There is no tenderness.  Skin: WDI Neuro: Mental Status: Patient is awake, dysarthric speech, able to name and repeat. Able to follow simple commands. Not able to give history. Cranial Nerves: II: Visual Fields are full.  III,IV, VI: EOMI without ptosis or diploplia. Pupils equal, round and reactive to light V: Facial sensation is symmetric to temperature per patient VII: left facial droop--old VIII: hearing is intact to voice X: Palat elevates symmetrically XI: Shoulder shrug is symmetric. XII: tongue is midline without atrophy or fasciculations.  Motor: Unable to hold left arm or leg off bed with increased tone. Right arm has 4/5 strength with no drift.  Sensory: Sensation  is symmetric to light touch and temperature in the arms and legs. Deep Tendon Reflexes: 2+ in upper extremities 2+ right KJ and 4+ left KJ, 4+ left ankle with clonus. Positive Hoffman'sright hand.   Plantars upgoing bilaterally.   Labs I have reviewed labs in epic and the results pertinent to this consultation are:  CBC    Component Value Date/Time   WBC 6.1 10/09/2019 0917   RBC 4.42 10/09/2019 0917   HGB 10.7 (L) 10/09/2019 0917   HGB 11.9 (L) 11/10/2017 1008   HCT 31.4 (L) 10/09/2019 0917   PLT 307 10/09/2019 0917   PLT 228 11/10/2017 1008   MCV 71.0 (L) 10/09/2019 0917   MCH 24.2 (L) 10/09/2019 0917   MCHC 34.1 10/09/2019 0917   RDW 15.9 (H) 10/09/2019 0917   LYMPHSABS 0.3 (L) 10/09/2019 0917   MONOABS 0.6 10/09/2019 0917   EOSABS 0.2 10/09/2019 0917   BASOSABS 0.0 10/09/2019 [id number]  CMP     Component Value Date/Time   NA 133 (L) 10/09/2019 0917   K 3.5 10/09/2019 0917   CL 99 10/09/2019 0917   CO2 25 10/09/2019 0917   GLUCOSE 111 (H) 10/09/2019 0917   BUN 8 10/09/2019 0917   CREATININE 0.89 10/09/2019 0917   CALCIUM 9.4 10/09/2019 0917   PROT 7.3 10/09/2019 0917   ALBUMIN 3.8 10/09/2019 0917   AST 9 (L) 10/09/2019 0917   ALT 9 10/09/2019 0917   ALKPHOS 72 10/09/2019 0917   BILITOT 0.8 10/09/2019 0917   GFRNONAA >60 10/09/2019 0917   GFRAA >60 10/09/2019 0917    Lipid Panel     Component Value Date/Time   CHOL 131 08/30/2019 0225   TRIG 52 08/30/2019 0225   HDL 36 (L) 08/30/2019 0225   CHOLHDL 3.6 08/30/2019 0225   VLDL 10 08/30/2019 0225   LDLCALC 85 08/30/2019 0225     Imaging I have reviewed the images obtained:  MRI examination of the brain-2.9 cm acute infarct within the right basal ganglia involving the right caudate and lentiform nuclei, as well as internal capsule.  Etta Quill PA-C [facility] [phone]  M-F  (9:00 am- 5:00 PM)  12/09/2019, 3:26 PM  Attending addendum Patient seen and examined Imaging independently  reviewed Patient independently examined  Assessment:  64 year old man past history of a pontine stroke with residual left hemiplegia, hypertension, tobacco abuse presenting for 3 days of left-sided weakness and MRI revealed a new 2.9 cm infarct in the right basal ganglia involving the right caudate/lentiform nuclei.  Also internal capsular involvement. Likely small vessel etiology. Part of the factor XI inhibitor trial Psychologist, occupational) with [facility] neurology. Will need admission for further work-up   Impression: Acute ischemic stroke  Recommendations: Admit to hospitalist CTA head and neck 2D echo A1c Lipid panel Continue Plavix for now.  Might need dual antiplatelets in addition to the experimental drug that he is on for the drug trial. PT OT ST Frequent neurochecks Telemetry Stroke swallow screen-n.p.o. until cleared by bedside swallow or formal swallow evaluation. Stroke team will follow.  -- Amie Portland, MD [facility] Pager: [phone] If 7pm to 7am, please call on call as listed on AMION.

## 2019-12-09 NOTE — H&P (Signed)
History and Physical    [name] [name] [id number] DOB: [birth year] DOA: 12/09/2019  PCP: Bernerd Limbo, MD  Patient coming from: Home  I have personally briefly reviewed patient's old medical records in [place]  Chief Complaint: Left-sided weakness and slurred speech  HPI: KWESI SLICER is a 64 y.o. male with medical history significant of hypertension, tobacco abuse, previous stroke, diet-controlled diabetes mellitus, CLL/small B-cell lymphoma followed by Dr. Arbie Cookey with last chemotherapy on October 2020 presents to emergency department due to left-sided weakness and slurred speech since 3 days.  Patient tells me that his symptoms started 3 days ago and started getting worse.  No history of head trauma, seizures, loss of consciousness.    No history of headache, blurry vision, facial droop, chest pain, shortness of breath, hypertension, leg swelling, fever, chills, cough, congestion, nausea, vomiting, diarrhea, decreased appetite or lethargy.  He lives with his mom and son at home.  No history of smoking, alcohol, illicit drug use. He is enrolled in [employer] factor XI inhibitor stroke prevention trial.  ED Course: Upon arrival to ED: Patient's respiratory rate in 20s, afebrile with no leukocytosis.  CBC shows microcytic anemia, CMP shows potassium of 3.2.  COVID-19 pending.  MRI brain shows 2.9 cm acute infarct within the right basal ganglia involving the right caudate and lentiform nuclei as well as internal capsule.  EDP consulted neurology.  tPA was not given as he was out of window.  [facility] hospitalist consulted for admission for acute stroke.  Review of Systems: As per HPI otherwise negative.    Past Medical History:  Diagnosis Date  . Allergic rhinitis, cause unspecified   . Backache, unspecified   . Body mass index 33.0-33.9, adult   . Diabetes mellitus without complication ([place])    Type II  . Elevated blood pressure reading without diagnosis of hypertension   .  Esophageal reflux   . Generalized pain   . Heartburn   . Insomnia, unspecified   . 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 Other abnormal blood chemistry   . Other and unspecified hyperlipidemia   . Other dyspnea and respiratory abnormality   . Other nonspecific findings on examination of blood(790.99)   . Pain in joint, lower leg   . Pneumonia    1968  . Sleep apnea    Does not use or own a CPAP  . Tobacco use disorder   . Unspecified essential hypertension     Past Surgical History:  Procedure Laterality Date  . bil wrist surgery    . BRONCHIAL BRUSHINGS  09/24/2019   Procedure: BRONCHIAL BRUSHINGS;  Surgeon: Candee Furbish, MD;  Location: [facility] ENDOSCOPY;  Service: Pulmonary;;  . BRONCHIAL WASHINGS  09/24/2019   Procedure: BRONCHIAL WASHINGS;  Surgeon: Candee Furbish, MD;  Location: [facility] ENDOSCOPY;  Service: Pulmonary;;  . ENDOSCOPIC CONCHA BULLOSA RESECTION Bilateral 05/18/2018   Procedure: ENDOSCOPIC CONCHA BULLOSA RESECTION;  Surgeon: Jerrell Belfast, MD;  Location: [place];  Service: ENT;  Laterality: Bilateral;  . NASAL SEPTOPLASTY W/ TURBINOPLASTY Bilateral 05/18/2018   Procedure: NASAL SEPTOPLASTY WITH TURBINATE REDUCTION;  Surgeon: Jerrell Belfast, MD;  Location: [place];  Service: ENT;  Laterality: Bilateral;  . ORIF FOREARM FRACTURE     right  . plastic surgery to face    . SINUS ENDO W/FUSION Bilateral 05/18/2018   Procedure: ENDOSCOPIC SINUS SURGERY WITH NAVIGATION;  Surgeon: Jerrell Belfast, MD;  Location: [place];  Service: ENT;  Laterality: Bilateral;  .  VIDEO BRONCHOSCOPY N/A 09/24/2019   Procedure: VIDEO BRONCHOSCOPY WITHOUT FLUORO;  Surgeon: Candee Furbish, MD;  Location: [facility] ENDOSCOPY;  Service: Pulmonary;  Laterality: N/A;     reports that he quit smoking about 7 years ago. His smoking use included cigars. He has a 4.00 pack-year smoking history. He has never used smokeless tobacco. He reports previous alcohol use. He reports  that he does not use drugs.  Allergies  Allergen Reactions  . Lisinopril Itching  . Tuberculin Tests Swelling    Family History  Problem Relation Age of Onset  . Diabetes Mother   . Hypertension Mother   . Cancer Paternal Uncle     Prior to Admission medications   Medication Sig Start Date End Date Taking? Authorizing Provider  atorvastatin (LIPITOR) 20 MG tablet Take 1 tablet (20 mg total) by mouth daily. 09/27/19  Yes Angiulli, Lavon Paganini, PA-C  clopidogrel (PLAVIX) 75 MG tablet Take 1 tablet (75 mg total) by mouth daily. 09/27/19  Yes Angiulli, Lavon Paganini, PA-C  Investigational - Study Medication Take 1 mg by mouth every morning. Study name:  Additional study details: 09/27/19  Yes Angiulli, Lavon Paganini, PA-C  Investigational - Study Medication Take 1 mg by mouth every morning. Study name: as per pharmacy Additional study details: 09/27/19  Yes Angiulli, Lavon Paganini, PA-C  methylphenidate (RITALIN) 5 MG tablet Take 1 tablet (5 mg total) by mouth daily. 09/27/19  Yes Angiulli, Lavon Paganini, PA-C  oxyCODONE-acetaminophen (PERCOCET/ROXICET) 5-325 MG tablet Take 2 tablets by mouth every 6 (six) hours as needed (for pain).  10/31/19  Yes [provider]  potassium chloride SA (KLOR-CON) 20 MEQ tablet Take 20 mEq by mouth daily. 11/17/19  Yes [provider]  acetaminophen (TYLENOL) 325 MG tablet Take 2 tablets (650 mg total) by mouth every 4 (four) hours as needed for mild pain (or temp > 37.5 C (99.5 F)). Patient not taking: Reported on 12/09/2019 09/27/19   Angiulli, Lavon Paganini, PA-C  pantoprazole (PROTONIX) 40 MG tablet Take 1 tablet (40 mg total) by mouth at bedtime. Patient not taking: Reported on 12/09/2019 09/27/19   Cathlyn Parsons, PA-C    Physical Exam: Vitals:   12/09/19 1730 12/09/19 1745 12/09/19 1800 12/09/19 1815  BP: (!) 174/81 (!) 150/86 (!) 149/91 (!) 150/89  Pulse: 66     Resp: (!) 7 (!) 22 (!) 21 17  Temp:      TempSrc:      SpO2: 100%     Weight:      Height:         Constitutional: NAD, calm, comfortable, has slurred speech, oriented to time place and person. Eyes: PERRL, lids and conjunctivae normal ENMT: Mucous membranes are moist. Posterior pharynx clear of any exudate or lesions.Normal dentition.  Neck: normal, supple, no masses, no thyromegaly Respiratory: clear to auscultation bilaterally, no wheezing, no crackles. Normal respiratory effort. No accessory muscle use.  Cardiovascular: Regular rate and rhythm, no murmurs / rubs / gallops. No extremity edema. 2+ pedal pulses. No carotid bruits.  Abdomen: no tenderness, no masses palpated. No hepatosplenomegaly. Bowel sounds positive.  Musculoskeletal: no clubbing / cyanosis. No joint deformity upper and lower extremities. Good ROM, no contractures. Normal muscle tone.  Skin: no rashes, lesions, ulcers. No induration Neurologic: Has slurred speech.  Power 4 out of 5 in right upper and lower extremity.  1 out of 5 in left upper and lower extremity.   Psychiatric: Normal judgment and insight. Alert and oriented x 3. Normal  mood.    Labs on Admission: I have personally reviewed following labs and imaging studies  CBC: Recent Labs  Lab 12/09/19 1607  WBC 6.0  NEUTROABS 4.8  HGB 10.9*  HCT 32.2*  MCV 74.9*  PLT 99991111   Basic Metabolic Panel: Recent Labs  Lab 12/09/19 1607  NA 138  K 3.2*  CL 104  CO2 25  GLUCOSE 104*  BUN 6*  CREATININE 0.80  CALCIUM 9.6   GFR: Estimated Creatinine Clearance: 88.4 mL/min (by C-G formula based on SCr of 0.8 mg/dL). Liver Function Tests: Recent Labs  Lab 12/09/19 1607  AST 9*  ALT 9  ALKPHOS 67  BILITOT 1.8*  PROT 6.8  ALBUMIN 3.7   No results for input(s): LIPASE, AMYLASE in the last 168 hours. No results for input(s): AMMONIA in the last 168 hours. Coagulation Profile: No results for input(s): INR, PROTIME in the last 168 hours. Cardiac Enzymes: No results for input(s): CKTOTAL, CKMB, CKMBINDEX, TROPONINI in the last 168 hours. BNP (last 3  results) No results for input(s): PROBNP in the last 8760 hours. HbA1C: No results for input(s): HGBA1C in the last 72 hours. CBG: No results for input(s): GLUCAP in the last 168 hours. Lipid Profile: No results for input(s): CHOL, HDL, LDLCALC, TRIG, CHOLHDL, LDLDIRECT in the last 72 hours. Thyroid Function Tests: No results for input(s): TSH, T4TOTAL, FREET4, T3FREE, THYROIDAB in the last 72 hours. Anemia Panel: No results for input(s): VITAMINB12, FOLATE, FERRITIN, TIBC, IRON, RETICCTPCT in the last 72 hours. Urine analysis:    Component Value Date/Time   COLORURINE YELLOW 09/26/2019 0424   APPEARANCEUR CLEAR 09/26/2019 0424   LABSPEC 1.015 09/26/2019 0424   PHURINE 6.0 09/26/2019 0424   GLUCOSEU NEGATIVE 09/26/2019 0424   HGBUR NEGATIVE 09/26/2019 0424   BILIRUBINUR NEGATIVE 09/26/2019 0424   KETONESUR NEGATIVE 09/26/2019 0424   PROTEINUR NEGATIVE 09/26/2019 0424   NITRITE NEGATIVE 09/26/2019 0424   LEUKOCYTESUR NEGATIVE 09/26/2019 0424    Radiological Exams on Admission: MR BRAIN WO CONTRAST  Result Date: 12/09/2019 CLINICAL DATA:  Focal neuro deficit, greater than 6 hours, stroke suspected. Additional history provided: Increased weakness to left side and difficulty speaking for 3 days, unable to walk for the past 3 days, history of prior CVA with residual left-sided deficits. EXAM: MRI HEAD WITHOUT CONTRAST TECHNIQUE: Multiplanar, multiecho pulse sequences of the brain and surrounding structures were obtained without intravenous contrast. COMPARISON:  Non-contrast head CT 09/13/2019, brain MRI 08/30/2019 FINDINGS: Brain: The exam is intermittently motion degraded. Most notably, there is moderate motion degradation of the sagittal T1 weighted sequence. There is a focus of restricted diffusion within the right basal ganglia which involves the right caudate and lentiform nuclei as well as internal capsule, measuring 2.6 x 2.0 x 2.9 cm (series 3, images 20 3-33). Findings are  consistent with acute infarction. Corresponding T2/FLAIR hyperintensity at this site. No evidence of acute infarct elsewhere within the brain. Redemonstrated chronic lacunar infarct within the paramedian right pontomedullary junction (series 6, image 7). Small chronic lacunar infarct within the left basal ganglia involving the left caudate and lentiform nuclei. Chronic lacunar infarct within the right thalamus. Additional chronic lacunar infarcts within the right cerebellar white matter. These infarcts were not present on prior MRI 08/30/2019. There is no evidence of intracranial mass. No midline shift or extra-axial fluid collection. No chronic intracranial blood products. Cerebral volume is normal. Vascular: Flow voids maintained within the proximal large arterial vessels. Skull and upper cervical spine: No focal marrow lesion. Incompletely  assessed upper cervical spondylosis. Sinuses/Orbits: Susceptibility artifact arising from surgical fixation hardware along the lateral left orbit. No acute orbital abnormality is demonstrated. Mild paranasal sinus mucosal thickening. Trace fluid within inferior left mastoid air cells. IMPRESSION: 2.9 cm acute infarct within the right basal ganglia involving the right caudate and lentiform nuclei, as well as internal capsule. Chronic lacunar infarcts within the left basal ganglia, right thalamus and right cerebellar white matter. These infarcts were not present on prior MRI 09/13/2019. Redemonstrated chronic lacunar infarct within the paramedian right pontomedullary junction. Electronically Signed   By: Kellie Simmering DO   On: 12/09/2019 15:09   DG Chest Portable 1 View  Result Date: 12/09/2019 CLINICAL DATA:  Left-sided weakness and dysarthria for the past 3 days. EXAM: PORTABLE CHEST 1 VIEW COMPARISON:  09/25/2019 FINDINGS: The cardiac silhouette remains borderline enlarged. Clear lungs with normal vascularity. Thoracic spine degenerative changes. IMPRESSION: No acute  abnormality. Electronically Signed   By: Claudie Revering M.D.   On: 12/09/2019 14:24    EKG: Independently reviewed.  Sinus rhythm, atrial premature complexes.  No ST elevation or depression noted.  Assessment/Plan Principal Problem:   CVA (cerebral vascular accident) ([place]) Active Problems:   Iron deficiency anemia   CLL (chronic lymphocytic leukemia) ([facility])   DM2 (diabetes mellitus, type 2) ([facility])   Benign essential HTN   Diffuse large B-cell lymphoma ([facility])   GERD (gastroesophageal reflux disease)   Hypokalemia    Acute ischemic stroke: -Patient presented with dysarthria and left-sided weakness. -Reviewed MRI. -admit forTelemetry monitoring -Stroke protocol -Allow for permissive hypertension for the first 24-48h - only treat PRN if SBP 123456 mmHg or diastolic blood pressure [id number]. Blood pressures can be gradually normalized to SBP<140 upon discharge. -CTA head and neck is ordered and is pending -Ordered lipid panel, A1c and echocardiogram to- rule out PFO -Frequent neuro checks -Continue Plavix and experimental drug that he is on for the drug trial per pharmacy. -Risk factor modification -Consult Neurology-appreciate input -PT/OT eval, Speech consult  Hyperlipidemia: Check lipid panel -Continue statin  GERD: Continue PPI  Hypokalemia: Check magnesium level -Replenished.  Repeat BMP tomorrow a.m.  Iron deficiency anemia: -H&H is stable.  Check iron studies  CLL/small B-cell lymphoma: -Followed by Dr. Irene Limbo outpatient  Well-controlled diabetes mellitus: Last A1c 5.9% On 08/29/2019 -Not on any medications at home.   DVT prophylaxis: Lovenox/SCD/TED Code Status: Full code-confirmed with the patient  family Communication: None present at bedside.  Plan of care discussed with patient in length and he verbalized understanding and agreed with it. Disposition Plan: To be determined  consults called: Neurology by EDP Admission status: Inpatient   Mckinley Jewel MD [facility]  If 7PM-7AM, please contact night-coverage [email or web address] Password [facility]  12/09/2019, 6:58 PM

## 2019-12-10 ENCOUNTER — Inpatient Hospital Stay (HOSPITAL_COMMUNITY): Payer: Medicare Other

## 2019-12-10 DIAGNOSIS — E1159 Type 2 diabetes mellitus with other circulatory complications: Secondary | ICD-10-CM

## 2019-12-10 DIAGNOSIS — I63311 Cerebral infarction due to thrombosis of right middle cerebral artery: Secondary | ICD-10-CM

## 2019-12-10 DIAGNOSIS — E876 Hypokalemia: Secondary | ICD-10-CM

## 2019-12-10 DIAGNOSIS — C9110 Chronic lymphocytic leukemia of B-cell type not having achieved remission: Secondary | ICD-10-CM

## 2019-12-10 DIAGNOSIS — I10 Essential (primary) hypertension: Secondary | ICD-10-CM

## 2019-12-10 DIAGNOSIS — E7800 Pure hypercholesterolemia, unspecified: Secondary | ICD-10-CM

## 2019-12-10 LAB — URINALYSIS, ROUTINE W REFLEX MICROSCOPIC
Bilirubin Urine: NEGATIVE
Glucose, UA: NEGATIVE mg/dL
Hgb urine dipstick: NEGATIVE
Ketones, ur: NEGATIVE mg/dL
Leukocytes,Ua: NEGATIVE
Nitrite: POSITIVE — AB
Protein, ur: NEGATIVE mg/dL
Specific Gravity, Urine: 1.029 (ref 1.005–1.030)
pH: 6 (ref 5.0–8.0)

## 2019-12-10 LAB — BASIC METABOLIC PANEL
Anion gap: 12 (ref 5–15)
BUN: 6 mg/dL — ABNORMAL LOW (ref 8–23)
CO2: 20 mmol/L — ABNORMAL LOW (ref 22–32)
Calcium: 9.6 mg/dL (ref 8.9–10.3)
Chloride: 104 mmol/L (ref 98–111)
Creatinine, Ser: 0.79 mg/dL (ref 0.61–1.24)
GFR calc Af Amer: >60 mL/min (ref >60)
GFR calc non Af Amer: >60 mL/min (ref >60)
Glucose, Bld: 97 mg/dL (ref 70–99)
Potassium: 3.3 mmol/L — ABNORMAL LOW (ref 3.5–5.1)
Sodium: 136 mmol/L (ref 135–145)

## 2019-12-10 LAB — CBC WITH DIFFERENTIAL/PLATELET
Abs Immature Granulocytes: 0.05 10*3/uL (ref 0.00–0.07)
Basophils Absolute: 0 10*3/uL (ref 0.0–0.1)
Basophils Relative: 1 %
Eosinophils Absolute: 0.1 10*3/uL (ref 0.0–0.5)
Eosinophils Relative: 1 %
HCT: 32.8 % — ABNORMAL LOW (ref 39.0–52.0)
Hemoglobin: 11.3 g/dL — ABNORMAL LOW (ref 13.0–17.0)
Immature Granulocytes: 1 %
Lymphocytes Relative: 9 %
Lymphs Abs: 0.5 10*3/uL — ABNORMAL LOW (ref 0.7–4.0)
MCH: 25.5 pg — ABNORMAL LOW (ref 26.0–34.0)
MCHC: 34.5 g/dL (ref 30.0–36.0)
MCV: 74 fL — ABNORMAL LOW (ref 80.0–100.0)
Monocytes Absolute: 0.6 10*3/uL (ref 0.1–1.0)
Monocytes Relative: 12 %
Neutro Abs: 3.9 10*3/uL (ref 1.7–7.7)
Neutrophils Relative %: 76 %
Platelets: 335 10*3/uL (ref 150–400)
RBC: 4.43 MIL/uL (ref 4.22–5.81)
RDW: 16.2 % — ABNORMAL HIGH (ref 11.5–15.5)
WBC: 5.2 10*3/uL (ref 4.0–10.5)
nRBC: 0 % (ref 0.0–0.2)

## 2019-12-10 LAB — LIPID PANEL
Cholesterol: 120 mg/dL (ref 0–200)
HDL: 34 mg/dL — ABNORMAL LOW (ref >40)
LDL Cholesterol: 75 mg/dL (ref 0–99)
Total CHOL/HDL Ratio: 3.5 RATIO
Triglycerides: 57 mg/dL (ref <150)
VLDL: 11 mg/dL (ref 0–40)

## 2019-12-10 LAB — RAPID URINE DRUG SCREEN, HOSP PERFORMED
Amphetamines: NOT DETECTED
Barbiturates: NOT DETECTED
Benzodiazepines: NOT DETECTED
Cocaine: NOT DETECTED
Opiates: NOT DETECTED
Tetrahydrocannabinol: NOT DETECTED

## 2019-12-10 LAB — HEMOGLOBIN A1C
Hgb A1c MFr Bld: 7 % — ABNORMAL HIGH (ref 4.8–5.6)
Mean Plasma Glucose: 154.2 mg/dL

## 2019-12-10 MED ORDER — ENOXAPARIN SODIUM 40 MG/0.4ML ~~LOC~~ SOLN
40.0000 mg | SUBCUTANEOUS | Status: DC
  Administered 2019-12-10 – 2019-12-12 (×3): 40 mg via SUBCUTANEOUS
  Filled 2019-12-10 (×3): qty 0.4

## 2019-12-10 MED ORDER — POTASSIUM CHLORIDE CRYS ER 20 MEQ PO TBCR
40.0000 meq | EXTENDED_RELEASE_TABLET | Freq: Once | ORAL | Status: AC
  Administered 2019-12-10: 40 meq via ORAL
  Filled 2019-12-10: qty 2

## 2019-12-10 MED ORDER — CLOPIDOGREL BISULFATE 75 MG PO TABS
75.0000 mg | ORAL_TABLET | Freq: Every day | ORAL | Status: DC
  Administered 2019-12-10 – 2019-12-12 (×3): 75 mg via ORAL
  Filled 2019-12-10 (×3): qty 1

## 2019-12-10 MED ORDER — ASPIRIN EC 81 MG PO TBEC
81.0000 mg | DELAYED_RELEASE_TABLET | Freq: Every day | ORAL | Status: DC
  Administered 2019-12-10 – 2019-12-12 (×3): 81 mg via ORAL
  Filled 2019-12-10 (×3): qty 1

## 2019-12-10 MED ORDER — ATORVASTATIN CALCIUM 40 MG PO TABS
40.0000 mg | ORAL_TABLET | Freq: Every day | ORAL | Status: DC
  Administered 2019-12-11 – 2019-12-12 (×2): 40 mg via ORAL
  Filled 2019-12-10 (×2): qty 1

## 2019-12-10 NOTE — Evaluation (Signed)
Physical Therapy Evaluation Patient Details Name: [name] [name] MRN: [id number] DOB: 01-09-1956 Today's Date: 12/10/2019   History of Present Illness  64 yo male admitted with sluured speech and L side weakness. MRI (+) 2.9 cm acute infarct with R basal ganglia involving R caudate and lentiform nuclei as well as internal capsule.  PMH HTN tobacco abuse, CVA DM2 CLL/small Bcell lymphoma chemo therapy 05/2019  Clinical Impression   Pt admitted with above diagnosis. Per pt's fiance, patient was walking short distances with assistance with RW and being pushed in wheelchair since return home from inpatient rehab admission 09/2019. She reports that neither son or her can provide 24/7 care and feels patient will need further rehab prior to discharge. Patient requiring 2 person moderate assistance for bed mobility and 2 person min assist for stand-pivot transfer due to left hemiparesis with resulting decreased balance. Pt currently with functional limitations due to the deficits listed below (see PT Problem List). Pt will benefit from skilled PT to increase their independence and safety with mobility to allow discharge to the venue listed below.       Follow Up Recommendations SNF;Supervision/Assistance - 24 hour    Equipment Recommendations  Other (comment)(TBA at next venue)    Recommendations for Other Services       Precautions / Restrictions Precautions Precautions: Fall Precaution Comments: resting hand splint for night time wear Restrictions Weight Bearing Restrictions: No      Mobility  Bed Mobility Overal bed mobility: Needs Assistance Bed Mobility: Rolling;Supine to Sit Rolling: +2 for physical assistance;Mod assist   Supine to sit: Mod assist     General bed mobility comments: required max cues for sequencing to roll right (in additon to physical assist); pt activating lt knee extension to assist with taking legs over EOB; initiated side to sit pushing up onto rt elbow with  incr assist to fully right torso  Transfers Overall transfer level: Needs assistance Equipment used: 2 person hand held assist Transfers: Sit to/from Stand;Stand Pivot Transfers Sit to Stand: +2 physical assistance;Min assist;From elevated surface Stand pivot transfers: +2 physical assistance;Min assist       General transfer comment: pt able to power up with cues for head placement between therapist to facilitate anterior weight shift and initiation. pt following command and static standing. pt able to sustain bearing weight on L LE. pt stepping toward chair on R side. pt total (A) to guard L UE throught transfer  Ambulation/Gait             General Gait Details: pivotal steps only; no buckling but ?hyperextending knee  Stairs            Wheelchair Mobility    Modified Rankin (Stroke Patients Only) Modified Rankin (Stroke Patients Only) Pre-Morbid Rankin Score: Moderately severe disability Modified Rankin: Severe disability     Balance Overall balance assessment: Needs assistance Sitting-balance support: Single extremity supported;Feet supported Sitting balance-Leahy Scale: Fair   Postural control: Left lateral lean Standing balance support: Single extremity supported;During functional activity Standing balance-Leahy Scale: Poor Standing balance comment: reliant on therapist. Pt will requires L Ue platform or Eva walker to support                              Pertinent Vitals/Pain Pain Assessment: Faces Faces Pain Scale: Hurts even more Pain Location: finger digits Pain Descriptors / Indicators: Grimacing;Discomfort Pain Intervention(s): Limited activity within patient's tolerance;Monitored during session;Repositioned  Home Living Family/patient expects to be discharged to:: Private residence Living Arrangements: Children;Parent(son (works); mother (ramp built for her)) Available Help at Discharge: Family;Available PRN/intermittently Type of  Home: House Home Access: Ramped entrance     Home Layout: One level Home Equipment: Grab bars - toilet;Wheelchair - [employer] - 2 wheels;Bedside commode;Shower seat Additional Comments: fiance reports always having help for transfers since last admission due to fall risk. pt requires use of w/c in the home due to fall risk. Fiance reports 'he needs more rehab before home"     Prior Function Level of Independence: Needs assistance   Gait / Transfers Assistance Needed: after discharge from CIR, was walking short distances with RW or being pushed in manual chair           Hand Dominance   Dominant Hand: Right    Extremity/Trunk Assessment   Upper Extremity Assessment Upper Extremity Assessment: Defer to OT evaluation    Lower Extremity Assessment Lower Extremity Assessment: LLE deficits/detail LLE Deficits / Details: hip flexion 1+, knee flexion 1+, knee extension 2+    Cervical / Trunk Assessment Cervical / Trunk Assessment: Other exceptions Cervical / Trunk Exceptions: R neck rotation preference, pt with decrease AROM L side needs continued facilitation to help with ROM. pt demonstrates decrease neck extension without extensive cues to help sustain position  Communication   Communication: Expressive difficulties  Cognition Arousal/Alertness: Awake/alert Behavior During Therapy: Flat affect Overall Cognitive Status: Impaired/Different from baseline Area of Impairment: Following commands;Awareness;Attention                   Current Attention Level: Sustained   Following Commands: Follows one step commands consistently;Follows one step commands with increased time   Awareness: Intellectual   General Comments: Slow processing with following commands and with speaking      General Comments General comments (skin integrity, edema, etc.): fiance on facetime with pt on arrival; she provided functional history. She feels pt is not safe to be at home at this time  and states she cannot provide 24/7 care    Exercises     Assessment/Plan    PT Assessment Patient needs continued PT services  PT Problem List Decreased strength;Decreased balance;Decreased mobility;Decreased cognition;Decreased knowledge of use of DME;Decreased safety awareness;Decreased knowledge of precautions;Impaired sensation       PT Treatment Interventions DME instruction;Gait training;Functional mobility training;Therapeutic activities;Balance training;Neuromuscular re-education;Cognitive remediation;Patient/family education;Wheelchair mobility training    PT Goals (Current goals can be found in the Care Plan section)  Acute Rehab PT Goals Patient Stated Goal: patient does not state. Fiance states "he needs more rehab" PT Goal Formulation: With family Time For Goal Achievement: 12/24/19 Potential to Achieve Goals: Good    Frequency Min 3X/week   Barriers to discharge Decreased caregiver support      Co-evaluation PT/OT/SLP Co-Evaluation/Treatment: Yes Reason for Co-Treatment: Complexity of the patient's impairments (multi-system involvement);Necessary to address cognition/behavior during functional activity;For patient/therapist safety PT goals addressed during session: Mobility/safety with mobility;Balance         AM-PAC PT "6 Clicks" Mobility  Outcome Measure Help needed turning from your back to your side while in a flat bed without using bedrails?: A Lot Help needed moving from lying on your back to sitting on the side of a flat bed without using bedrails?: A Lot Help needed moving to and from a bed to a chair (including a wheelchair)?: A Lot Help needed standing up from a chair using your arms (e.g., wheelchair or bedside chair)?: A  Little Help needed to walk in hospital room?: A Lot Help needed climbing 3-5 steps with a railing? : A Lot 6 Click Score: 13    End of Session Equipment Utilized During Treatment: Gait belt Activity Tolerance: Patient tolerated  treatment well Patient left: in chair;with call bell/phone within reach;with chair alarm set Nurse Communication: Mobility status PT Visit Diagnosis: Hemiplegia and hemiparesis;Other abnormalities of gait and mobility (R26.89);Unsteadiness on feet (R26.81) Hemiplegia - Right/Left: Left Hemiplegia - dominant/non-dominant: Non-dominant Hemiplegia - caused by: Cerebral infarction    Time: 1247-1310 PT Time Calculation (min) (ACUTE ONLY): 23 min   Charges:   PT Evaluation $PT Eval Moderate Complexity: 1 Mod           Arby Barrette, PT Pager [phone]   Rexanne Mano 12/10/2019, 5:19 PM

## 2019-12-10 NOTE — Progress Notes (Signed)
SLP Cancellation Note  Patient Details Name: [name] [name] MRN: [id number] DOB: [birth year]   Cancelled treatment:       Reason Eval/Treat Not Completed: Fatigue/lethargy limiting ability to participate.  SLP and MD attempted to rouse pt with tactile and verbal stimulation, but he was only intermittently alert and he did not respond to questions.  SLP will re-attempt SLE when pt is more alert.     [name] [name] 12/10/2019, 10:20 AM

## 2019-12-10 NOTE — Progress Notes (Signed)
PROGRESS NOTE    [name] [name]  [id number] DOB: 1956-08-07 DOA: 12/09/2019 PCP: Bernerd Limbo, MD   Brief Narrative: 64 y.o m with history of hypertension, tobacco abuse, previous stroke, diabetes mellitus diet-controlled, CLL/small B-cell lymphoma followed by Dr. Arbie Cookey last chemo 05/2019 presented to ED 12/09/19 with left-sided weakness and slurred speech x 3 days and getting worse. In the ED MRI brain showed 2.9 cm acute infarct within the right basal ganglia involving the right caudate and lentiform nuclei as well as internal capsule, neurology was consulted and patient was admitted for further management.  Patient is out of TPA window given onset 3 days ago  Subjective:  Eyes closed. Opens with touch pain ful stimuli " stop that" when given noxious stimuli. Nods head sometime but not consistent On RA, comfortable. SLP evaluating as well. After a few minutes nursing reports that patient was interactive with her, and he just did not want to interact and talk earlier. Assessment & Plan:  Acute ischemic infarct involving right BG/Caudate and lentiform nuclei: sleepy this am. Passed swallow eval yesterday. Neurology  On board. Will complete the stroke work-up as below with, LDL 75 goal less than 70, A1c 7.0, CTA head and neck-B ICA bifurcation minimal atelectasis, B supraclavicular and paratracheal adenopathy consistent with B-cell lymphoma. PT OT speech eval and further plan as per neurology.  Continue Plavix,asaa, lipitor increased to 40 from 20 mg and experimental drug that he is on further drug trial per pharmacy, risk modification strategy.  CLL/B-cell lymphoma followed on outpatient basis.   Iron deficiency anemia:, Iron low at 37.  Hb stable in 11. follow-up outpatient with oncology   DM2 diet controlled with stable A1c at 5.9 in January/2021  Benign essential HTN: Stable, allow permissive hypertension  GERD-continue PPI   Hypokalemia: Repleting. Mag stable.  DVT  prophylaxis:SCD- Lovenos per  Neuro Code Status: full Family Communication: plan of care discussed with patient at bedside.  Patient was not communicative not responding and did not interact  Status is: Inpatient Remains inpatient appropriate because:Inpatient level of care appropriate due to severity of illness  Dispo: The patient is from: Home              Anticipated d/c is to: SNF VS CIR VS HH- Pending PT/OT/SLP.              Anticipated d/c date is: 1 day              Patient currently is not medically stable to d/c. Nutrition: Diet Order            Diet heart healthy/carb modified Room service appropriate? Yes; Fluid consistency: Thin  Diet effective now             Body mass index is 23.81 kg/m. Consultants: Neurology Procedures:see note Microbiology:see note  Medications: Scheduled Meds:   stroke: mapping our early stages of recovery book   Does not apply Once   aspirin EC  81 mg Oral Daily   [START ON 12/11/2019] atorvastatin  40 mg Oral Daily   clopidogrel  75 mg Oral Daily   enoxaparin (LOVENOX) injection  40 mg Subcutaneous Q24H   Continuous Infusions:  Antimicrobials: Anti-infectives (From admission, onward)   None       Objective: Vitals: Today's Vitals   12/10/19 0130 12/10/19 0330 12/10/19 0530 12/10/19 1000  BP: (!) 152/85 (!) 153/84 (!) 149/92 (!) 147/99  Pulse: 70 71 85 73  Resp:  20 (!) 21 20  Temp:  99.2 F (37.3 C) 99.1 F (37.3 C) 98.7 F (37.1 C)  TempSrc:  Oral Oral Oral  SpO2: 99% 99% 99% 98%  Weight:      Height:        Intake/Output Summary (Last 24 hours) at 12/10/2019 1422 Last data filed at 12/10/2019 1300 Gross per 24 hour  Intake 240 ml  Output --  Net 240 ml   Filed Weights   12/09/19 1613  Weight: 68.9 kg   Weight change:    Intake/Output from previous day: No intake/output data recorded. Intake/Output this shift: Total I/O In: 240 [P.O.:240] Out: -   Examination:  General exam: Eyes closed. Opens  with touch pain ful stimuli " stop that" when given noxious stimuli. HEENT:Oral mucosa moist, Ear/Nose WNL grossly,dentition normal. Respiratory system: bilaterally clear,no wheezing or crackles,no use of accessory muscle, non tender. Cardiovascular system: S1 & S2 +, regular, No JVD. Gastrointestinal system: Abdomen soft, NT,ND, BS+. Nervous System: sleepy. moving RLE not left  Extremities: No edema, distal peripheral pulses palpable.  Skin: No rashes,no icterus. MSK: Normal muscle bulk,tone, power  Data Reviewed: I have personally reviewed following labs and imaging studies CBC: Recent Labs  Lab 12/09/19 1607 12/10/19 0448  WBC 6.0 5.2  NEUTROABS 4.8 3.9  HGB 10.9* 11.3*  HCT 32.2* 32.8*  MCV 74.9* 74.0*  PLT 320 [id number]   Basic Metabolic Panel: Recent Labs  Lab 12/09/19 1607 12/09/19 1905 12/10/19 0448  NA 138  --  136  K 3.2*  --  3.3*  CL 104  --  104  CO2 25  --  20*  GLUCOSE 104*  --  97  BUN 6*  --  6*  CREATININE 0.80  --  0.79  CALCIUM 9.6  --  9.6  MG  --  1.8  --    GFR: Estimated Creatinine Clearance: 88.4 mL/min (by C-G formula based on SCr of 0.79 mg/dL). Liver Function Tests: Recent Labs  Lab 12/09/19 1607  AST 9*  ALT 9  ALKPHOS 67  BILITOT 1.8*  PROT 6.8  ALBUMIN 3.7   No results for input(s): LIPASE, AMYLASE in the last 168 hours. No results for input(s): AMMONIA in the last 168 hours. Coagulation Profile: No results for input(s): INR, PROTIME in the last 168 hours. Cardiac Enzymes: No results for input(s): CKTOTAL, CKMB, CKMBINDEX, TROPONINI in the last 168 hours. BNP (last 3 results) No results for input(s): PROBNP in the last 8760 hours. HbA1C: Recent Labs    12/10/19 0448  HGBA1C 7.0*   CBG: No results for input(s): GLUCAP in the last 168 hours. Lipid Profile: Recent Labs    12/10/19 0448  CHOL 120  HDL 34*  LDLCALC 75  TRIG 57  CHOLHDL 3.5   Thyroid Function Tests: No results for input(s): TSH, T4TOTAL, FREET4, T3FREE,  THYROIDAB in the last 72 hours. Anemia Panel: Recent Labs    12/09/19 2200  FERRITIN 200  TIBC 312  IRON 37*   Sepsis Labs: No results for input(s): PROCALCITON, LATICACIDVEN in the last 168 hours.  Recent Results (from the past 240 hour(s))  SARS CORONAVIRUS 2 (TAT 6-24 HRS) Nasopharyngeal Nasopharyngeal Swab     Status: None   Collection Time: 12/09/19  4:05 PM   Specimen: Nasopharyngeal Swab  Result Value Ref Range Status   SARS Coronavirus 2 NEGATIVE NEGATIVE Final    Comment: (NOTE) SARS-CoV-2 target nucleic acids are NOT DETECTED. The SARS-CoV-2 RNA is generally detectable in upper and lower respiratory specimens during the acute  phase of infection. Negative results do not preclude SARS-CoV-2 infection, do not rule out co-infections with other pathogens, and should not be used as the sole basis for treatment or other patient management decisions. Negative results must be combined with clinical observations, patient history, and epidemiological information. The expected result is Negative. Fact Sheet for Patients: SugarRoll.be Fact Sheet for Healthcare Providers: https://www.woods-mathews.com/ This test is not yet approved or cleared by the [place] FDA and  has been authorized for detection and/or diagnosis of SARS-CoV-2 by FDA under an Emergency Use Authorization (EUA). This EUA will remain  in effect (meaning this test can be used) for the duration of the COVID-19 declaration under Section 56 4(b)(1) of the Act, 21 U.S.C. section 360bbb-3(b)(1), unless the authorization is terminated or revoked sooner. Performed at [facility], [place] [address], [place], [place] [address]       Radiology Studies: CT ANGIO HEAD W OR WO CONTRAST  Result Date: 12/09/2019 CLINICAL DATA:  Left-sided weakness and speech disturbance over the last several days. MRI shows right deep brain infarction. History of B-cell lymphoma EXAM: CT  ANGIOGRAPHY HEAD AND NECK TECHNIQUE: Multidetector CT imaging of the head and neck was performed using the standard protocol during bolus administration of intravenous contrast. Multiplanar CT image reconstructions and MIPs were obtained to evaluate the vascular anatomy. Carotid stenosis measurements (when applicable) are obtained utilizing NASCET criteria, using the distal internal carotid diameter as the denominator. CONTRAST:  64mL OMNIPAQUE IOHEXOL 350 MG/ML SOLN COMPARISON:  MRI 12/09/2019. CT 09/13/2019 FINDINGS: CT HEAD Brain: Acute infarction evident in the right basal ganglia and adjacent white matter tracks with mild swelling but no hemorrhage or significant mass effect. Chronic small-vessel ischemic changes of the pons. Old right thalamic lacunar infarction. Small-vessel ischemic changes of the white matter. Vascular: No abnormal vascular finding. Skull: Negative Sinuses: Clear Orbits: Old surgery left lateral orbital rim. No internal orbital pathology. CTA NECK FINDINGS Aortic arch: Aortic atherosclerosis. No aneurysm or dissection. Branching pattern is normal without origin stenosis. Right carotid system: Common carotid artery widely patent to the bifurcation. Minimal plaque at the bifurcation but no stenosis. Cervical ICA widely patent. Left carotid system: Common carotid artery widely patent to the bifurcation. Minimal plaque at the bifurcation but no stenosis. Cervical ICA widely patent to the skull base. Vertebral arteries: No vertebral artery origin stenosis. Both vertebral arteries appear normal through the cervical region to the foramen magnum. Skeleton: Ordinary cervical spondylosis. Other neck: No upper cervical mass or lymphadenopathy. Bilateral supraclavicular enlarged nodes. Upper chest: Prominent paratracheal lymph nodes unchanged since the previous study. Nodes are consistent with the patient's history of B-cell lymphoma. Review of the MIP images confirms the above findings CTA HEAD  FINDINGS Anterior circulation: Both internal carotid arteries patent through the skull base and siphon regions. The anterior and middle cerebral vessels are patent without large or medium vessel occlusion or proximal stenosis. No aneurysm or vascular malformation. Posterior circulation: Both vertebral arteries patent to the basilar. No basilar stenosis. Posterior circulation branch vessels appear normal. Venous sinuses: Patent normal. Anatomic variants: None significant. Review of the MIP images confirms the above findings IMPRESSION: 1. Acute infarction in the right basal ganglia and adjacent white matter tracks with mild swelling but no hemorrhage or significant mass effect. 2. No intracranial large or medium vessel occlusion or correctable proximal stenosis. 3. Minimal atherosclerotic change at both carotid bifurcations but no stenosis. 4. Bilateral supraclavicular and paratracheal adenopathy as seen on the previous study consistent with the  patient's history of B-cell lymphoma. Aortic Atherosclerosis (ICD10-I70.0). Electronically Signed   By: Nelson Chimes M.D.   On: 12/09/2019 19:47   CT ANGIO NECK W OR WO CONTRAST  Result Date: 12/09/2019 CLINICAL DATA:  Left-sided weakness and speech disturbance over the last several days. MRI shows right deep brain infarction. History of B-cell lymphoma EXAM: CT ANGIOGRAPHY HEAD AND NECK TECHNIQUE: Multidetector CT imaging of the head and neck was performed using the standard protocol during bolus administration of intravenous contrast. Multiplanar CT image reconstructions and MIPs were obtained to evaluate the vascular anatomy. Carotid stenosis measurements (when applicable) are obtained utilizing NASCET criteria, using the distal internal carotid diameter as the denominator. CONTRAST:  66mL OMNIPAQUE IOHEXOL 350 MG/ML SOLN COMPARISON:  MRI 12/09/2019. CT 09/13/2019 FINDINGS: CT HEAD Brain: Acute infarction evident in the right basal ganglia and adjacent white matter  tracks with mild swelling but no hemorrhage or significant mass effect. Chronic small-vessel ischemic changes of the pons. Old right thalamic lacunar infarction. Small-vessel ischemic changes of the white matter. Vascular: No abnormal vascular finding. Skull: Negative Sinuses: Clear Orbits: Old surgery left lateral orbital rim. No internal orbital pathology. CTA NECK FINDINGS Aortic arch: Aortic atherosclerosis. No aneurysm or dissection. Branching pattern is normal without origin stenosis. Right carotid system: Common carotid artery widely patent to the bifurcation. Minimal plaque at the bifurcation but no stenosis. Cervical ICA widely patent. Left carotid system: Common carotid artery widely patent to the bifurcation. Minimal plaque at the bifurcation but no stenosis. Cervical ICA widely patent to the skull base. Vertebral arteries: No vertebral artery origin stenosis. Both vertebral arteries appear normal through the cervical region to the foramen magnum. Skeleton: Ordinary cervical spondylosis. Other neck: No upper cervical mass or lymphadenopathy. Bilateral supraclavicular enlarged nodes. Upper chest: Prominent paratracheal lymph nodes unchanged since the previous study. Nodes are consistent with the patient's history of B-cell lymphoma. Review of the MIP images confirms the above findings CTA HEAD FINDINGS Anterior circulation: Both internal carotid arteries patent through the skull base and siphon regions. The anterior and middle cerebral vessels are patent without large or medium vessel occlusion or proximal stenosis. No aneurysm or vascular malformation. Posterior circulation: Both vertebral arteries patent to the basilar. No basilar stenosis. Posterior circulation branch vessels appear normal. Venous sinuses: Patent normal. Anatomic variants: None significant. Review of the MIP images confirms the above findings IMPRESSION: 1. Acute infarction in the right basal ganglia and adjacent white matter tracks  with mild swelling but no hemorrhage or significant mass effect. 2. No intracranial large or medium vessel occlusion or correctable proximal stenosis. 3. Minimal atherosclerotic change at both carotid bifurcations but no stenosis. 4. Bilateral supraclavicular and paratracheal adenopathy as seen on the previous study consistent with the patient's history of B-cell lymphoma. Aortic Atherosclerosis (ICD10-I70.0). Electronically Signed   By: Nelson Chimes M.D.   On: 12/09/2019 19:47   MR BRAIN WO CONTRAST  Result Date: 12/09/2019 CLINICAL DATA:  Focal neuro deficit, greater than 6 hours, stroke suspected. Additional history provided: Increased weakness to left side and difficulty speaking for 3 days, unable to walk for the past 3 days, history of prior CVA with residual left-sided deficits. EXAM: MRI HEAD WITHOUT CONTRAST TECHNIQUE: Multiplanar, multiecho pulse sequences of the brain and surrounding structures were obtained without intravenous contrast. COMPARISON:  Non-contrast head CT 09/13/2019, brain MRI 08/30/2019 FINDINGS: Brain: The exam is intermittently motion degraded. Most notably, there is moderate motion degradation of the sagittal T1 weighted sequence. There is a focus of restricted  diffusion within the right basal ganglia which involves the right caudate and lentiform nuclei as well as internal capsule, measuring 2.6 x 2.0 x 2.9 cm (series 3, images 20 3-33). Findings are consistent with acute infarction. Corresponding T2/FLAIR hyperintensity at this site. No evidence of acute infarct elsewhere within the brain. Redemonstrated chronic lacunar infarct within the paramedian right pontomedullary junction (series 6, image 7). Small chronic lacunar infarct within the left basal ganglia involving the left caudate and lentiform nuclei. Chronic lacunar infarct within the right thalamus. Additional chronic lacunar infarcts within the right cerebellar white matter. These infarcts were not present on prior MRI  08/30/2019. There is no evidence of intracranial mass. No midline shift or extra-axial fluid collection. No chronic intracranial blood products. Cerebral volume is normal. Vascular: Flow voids maintained within the proximal large arterial vessels. Skull and upper cervical spine: No focal marrow lesion. Incompletely assessed upper cervical spondylosis. Sinuses/Orbits: Susceptibility artifact arising from surgical fixation hardware along the lateral left orbit. No acute orbital abnormality is demonstrated. Mild paranasal sinus mucosal thickening. Trace fluid within inferior left mastoid air cells. IMPRESSION: 2.9 cm acute infarct within the right basal ganglia involving the right caudate and lentiform nuclei, as well as internal capsule. Chronic lacunar infarcts within the left basal ganglia, right thalamus and right cerebellar white matter. These infarcts were not present on prior MRI 09/13/2019. Redemonstrated chronic lacunar infarct within the paramedian right pontomedullary junction. Electronically Signed   By: Kellie Simmering DO   On: 12/09/2019 15:09   DG Chest Portable 1 View  Result Date: 12/09/2019 CLINICAL DATA:  Left-sided weakness and dysarthria for the past 3 days. EXAM: PORTABLE CHEST 1 VIEW COMPARISON:  09/25/2019 FINDINGS: The cardiac silhouette remains borderline enlarged. Clear lungs with normal vascularity. Thoracic spine degenerative changes. IMPRESSION: No acute abnormality. Electronically Signed   By: Claudie Revering M.D.   On: 12/09/2019 14:24     LOS: 1 day   Time spent: More than 50% of that time was spent in counseling and/or coordination of care.  Antonieta Pert, MD [facility]  12/10/2019, 2:22 PM

## 2019-12-10 NOTE — Evaluation (Signed)
Occupational Therapy Evaluation Patient Details Name: [name] [name] MRN: [id number] DOB: [birth year] Today's Date: 12/10/2019    History of Present Illness 64 yo male admitted with sluured speech and L side weakness. MRI (+) 2.9 cm acute infarct with R basal ganglia involving R caudate and lentiform nuclei as well as internal capsule.  PMH HTN tobacco abuse, CVA DM2 CLL/small Bcell lymphoma chemo therapy 05/2019   Clinical Impression   PT admitted with R Basal ganglia infarct. Pt currently with functional limitiations due to the deficits listed below (see OT problem list). Pt requires total +2 min (A) for stand pivot transfer with very flat affect and decrease awareness to L side of the body. Fiance requesting increased rehab prior to d/c home. Pt requires cues for arousal x2 during session with name call.  Pt will benefit from skilled OT to increase their independence and safety with adls and balance to allow discharge SNF.     Follow Up Recommendations  SNF    Equipment Recommendations  None recommended by OT(has DME per fiance)    Recommendations for Other Services Speech consult     Precautions / Restrictions Precautions Precautions: Fall Precaution Comments: resting hand splint for night time wear      Mobility Bed Mobility Overal bed mobility: Needs Assistance Bed Mobility: Rolling;Supine to Sit Rolling: +2 for physical assistance;Mod assist   Supine to sit: +2 for physical assistance;Min assist     General bed mobility comments: pt requries (A) to facilitate roll to the R side. pt able to sustain knee flexion but no activation of trunk or L arm. pt needs max cues and tactile cue to rotate head. pt able to push up from R UE but unable to sustain without (A). pt with L rib cage  closed and R rib cage flared. pt with neck rotated to R and posterior L LOB.   Transfers Overall transfer level: Needs assistance   Transfers: Sit to/from Stand;Stand Pivot Transfers Sit to  Stand: +2 physical assistance;Min assist;From elevated surface Stand pivot transfers: +2 physical assistance;Min assist       General transfer comment: pt able to power up with cues for head placement between therapist to facilitate anterior weight shift and initiation. pt following command and static standing. pt able to sustain bearing weight on L LE. pt stepping toward chair on R side. pt total (A) to guard L UE throught transfer    Balance Overall balance assessment: Needs assistance Sitting-balance support: Single extremity supported;Feet supported Sitting balance-Leahy Scale: Fair   Postural control: Left lateral lean Standing balance support: Single extremity supported;During functional activity Standing balance-Leahy Scale: Poor Standing balance comment: reliant on therapist. Pt will requires L Ue platform or Eva walker to support                            ADL either performed or assessed with clinical judgement   ADL Overall ADL's : Needs assistance/impaired Eating/Feeding: Set up;Bed level Eating/Feeding Details (indicate cue type and reason): requires positioning for R lean in bed to help pt self feed. pt only eating food offered in R visual field Grooming: Moderate assistance   Upper Body Bathing: Moderate assistance   Lower Body Bathing: Maximal assistance   Upper Body Dressing : Moderate assistance   Lower Body Dressing: Maximal assistance   Toilet Transfer: +2 for physical assistance;Minimal assistance;Stand-pivot Toilet Transfer Details (indicate cue type and reason): simulated EOB to chair  General ADL Comments: pt transferred oob to chair with facilitation of body alignment work prior at [employer]   Additional Comments: to further assess. pt does track to L visual field but has a R gaze preference     Perception Perception Perception Tested?: Yes Perception Deficits: Inattention/neglect Inattention/Neglect: Does not attend  to left visual field;Does not attend to left side of body;Impaired- to be further tested in functional context   Praxis      Pertinent Vitals/Pain Pain Assessment: Faces Faces Pain Scale: Hurts even more Pain Location: finger digits Pain Descriptors / Indicators: Grimacing;Discomfort Pain Intervention(s): Monitored during session;Repositioned     Hand Dominance Right   Extremity/Trunk Assessment Upper Extremity Assessment Upper Extremity Assessment: LUE deficits/detail LUE Deficits / Details: flaccid with discomfort with digit ROM. pt requires very gentle PROM to start to initiate. fiance reports baseline wears night splint. recommending night splint acutely with PROM LUE Sensation: decreased proprioception LUE Coordination: decreased fine motor;decreased gross motor   Lower Extremity Assessment Lower Extremity Assessment: Defer to PT evaluation   Cervical / Trunk Assessment Cervical / Trunk Assessment: Other exceptions Cervical / Trunk Exceptions: R neck rotation preference, pt with decrease AROM L side needs continued facilitation to help with ROM. pt demonstrates decrease neck extension without extensive cues to help sustain position   Communication Communication Communication: Expressive difficulties   Cognition Arousal/Alertness: Awake/alert Behavior During Therapy: Flat affect Overall Cognitive Status: Within Functional Limits for tasks assessed                                     General Comments  at risk for contractures L UE due to decreased ROM and poor recall of self PROM     Exercises Exercises: Other exercises Other Exercises Other Exercises: cervical neck rotation, scapula retraction, trunk elongation and anterior pelvic tilt at EOB to prepare for transfer adn static sit   Shoulder Instructions      Home Living Family/patient expects to be discharged to:: Private residence Living Arrangements: Children;Parent(son (works); mother (ramp built  for her)) Available Help at Discharge: Family;Available PRN/intermittently Type of Home: House Home Access: Ramped entrance     Home Layout: One level     Bathroom Shower/Tub: Occupational psychologist: Standard     Home Equipment: Grab bars - toilet;Wheelchair - [employer] - 2 wheels;Bedside commode;Shower seat   Additional Comments: fiance reports always having help for transfers since last admission due to fall risk. pt requires use of w/c in the home due to fall risk. Fiance reports 'he needs more rehab before home"       Prior Functioning/Environment Level of Independence: Independent with assistive device(s)        Comments: prior to last hospitalization was walking with RW or using wheelchair (depending on the day)        OT Problem List: Decreased strength;Decreased activity tolerance;Impaired balance (sitting and/or standing);Impaired vision/perception;Decreased coordination;Decreased cognition;Decreased safety awareness;Decreased knowledge of use of DME or AE;Decreased knowledge of precautions;Impaired UE functional use;Impaired sensation;Decreased range of motion      OT Treatment/Interventions: Self-care/ADL training;Neuromuscular education;Therapeutic exercise;DME and/or AE instruction;Manual therapy;Modalities;Splinting;Therapeutic activities;Cognitive remediation/compensation;Visual/perceptual remediation/compensation;Patient/family education;Balance training    OT Goals(Current goals can be found in the care plan section) Acute Rehab OT Goals Patient Stated Goal: patient does not state. Fiance states "he needs more rehab" OT Goal Formulation: With patient/family Time For Goal Achievement: 12/24/19 Potential  to Achieve Goals: Good  OT Frequency: Min 2X/week   Barriers to D/C: Decreased caregiver support  lives with mother who has pace services, son lives there but works so patient is alone. fiance lives in [place] and does not visit daily        Co-evaluation PT/OT/SLP Co-Evaluation/Treatment: Yes Reason for Co-Treatment: For patient/therapist safety;To address functional/ADL transfers   OT goals addressed during session: ADL's and self-care;Strengthening/ROM      AM-PAC OT "6 Clicks" Daily Activity     Outcome Measure Help from another person eating meals?: A Little Help from another person taking care of personal grooming?: A Little Help from another person toileting, which includes using toliet, bedpan, or urinal?: A Lot Help from another person bathing (including washing, rinsing, drying)?: A Lot Help from another person to put on and taking off regular upper body clothing?: A Lot Help from another person to put on and taking off regular lower body clothing?: A Lot 6 Click Score: 14   End of Session Equipment Utilized During Treatment: Gait belt Nurse Communication: Mobility status;Precautions  Activity Tolerance: Patient tolerated treatment well Patient left: in chair;with call bell/phone within reach;with chair alarm set  OT Visit Diagnosis: Unsteadiness on feet (R26.81);Muscle weakness (generalized) (M62.81);Hemiplegia and hemiparesis Hemiplegia - Right/Left: Left Hemiplegia - dominant/non-dominant: Non-Dominant Hemiplegia - caused by: Cerebral infarction                Time: 1247-1310(1204-1204 -position for lunch) OT Time Calculation (min): 23 min Charges:  OT General Charges $OT Visit: 1 Visit OT Evaluation $OT Eval Moderate Complexity: 1 Mod   Brynn, OTR/L  Acute Rehabilitation Services Pager: [phone] Office: [phone] .   Jeri Modena 12/10/2019, 2:35 PM

## 2019-12-10 NOTE — Plan of Care (Signed)
  Problem: Education: Goal: Knowledge of General Education information will improve Description: Including pain rating scale, medication(s)/side effects and non-pharmacologic comfort measures Outcome: Progressing   Problem: Safety: Goal: Ability to remain free from injury will improve Outcome: Progressing   Problem: Education: Goal: Knowledge of patient specific risk factors addressed and post discharge goals established will improve Outcome: Progressing

## 2019-12-10 NOTE — Progress Notes (Signed)
STROKE TEAM PROGRESS NOTE   INTERVAL HISTORY No family at bedside.  Patient lying in bed, significant flat affect, however able to answer all orientation questions correctly, able to follow simple commands.  Still has dense left hemiplegia and left facial droop.  MRI showed right BG/CR infarct.  Of note, he is in [facility] stroke prevention trial. Will need to stop investigational medication and repeat MRI per trial protocol.  Vitals:   12/09/19 2330 12/10/19 0130 12/10/19 0330 12/10/19 0530  BP: (!) 150/80 (!) 152/85 (!) 153/84 (!) 149/92  Pulse: 75 70 71 85  Resp: 15  20 (!) 21  Temp: 99.9 F (37.7 C)  99.2 F (37.3 C) 99.1 F (37.3 C)  TempSrc: Oral  Oral Oral  SpO2: 99% 99% 99% 99%  Weight:      Height:        CBC:  Recent Labs  Lab 12/09/19 1607 12/10/19 0448  WBC 6.0 5.2  NEUTROABS 4.8 3.9  HGB 10.9* 11.3*  HCT 32.2* 32.8*  MCV 74.9* 74.0*  PLT 320 [id number]    Basic Metabolic Panel:  Recent Labs  Lab 12/09/19 1607 12/09/19 1905 12/10/19 0448  NA 138  --  136  K 3.2*  --  3.3*  CL 104  --  104  CO2 25  --  20*  GLUCOSE 104*  --  97  BUN 6*  --  6*  CREATININE 0.80  --  0.79  CALCIUM 9.6  --  9.6  MG  --  1.8  --    Lipid Panel:     Component Value Date/Time   CHOL 120 12/10/2019 0448   TRIG 57 12/10/2019 0448   HDL 34 (L) 12/10/2019 0448   CHOLHDL 3.5 12/10/2019 0448   VLDL 11 12/10/2019 0448   LDLCALC 75 12/10/2019 0448   HgbA1c:  Lab Results  Component Value Date   HGBA1C 5.9 (H) 08/29/2019   Urine Drug Screen:     Component Value Date/Time   LABOPIA NONE DETECTED 08/29/2019 2118   COCAINSCRNUR NONE DETECTED 08/29/2019 2118   LABBENZ NONE DETECTED 08/29/2019 2118   AMPHETMU NONE DETECTED 08/29/2019 2118   THCU NONE DETECTED 08/29/2019 2118   LABBARB NONE DETECTED 08/29/2019 2118    Alcohol Level No results found for: ETH  IMAGING past 24 hours CT ANGIO HEAD W OR WO CONTRAST  Result Date: 12/09/2019 CLINICAL DATA:  Left-sided weakness and  speech disturbance over the last several days. MRI shows right deep brain infarction. History of B-cell lymphoma EXAM: CT ANGIOGRAPHY HEAD AND NECK TECHNIQUE: Multidetector CT imaging of the head and neck was performed using the standard protocol during bolus administration of intravenous contrast. Multiplanar CT image reconstructions and MIPs were obtained to evaluate the vascular anatomy. Carotid stenosis measurements (when applicable) are obtained utilizing NASCET criteria, using the distal internal carotid diameter as the denominator. CONTRAST:  13mL OMNIPAQUE IOHEXOL 350 MG/ML SOLN COMPARISON:  MRI 12/09/2019. CT 09/13/2019 FINDINGS: CT HEAD Brain: Acute infarction evident in the right basal ganglia and adjacent white matter tracks with mild swelling but no hemorrhage or significant mass effect. Chronic small-vessel ischemic changes of the pons. Old right thalamic lacunar infarction. Small-vessel ischemic changes of the white matter. Vascular: No abnormal vascular finding. Skull: Negative Sinuses: Clear Orbits: Old surgery left lateral orbital rim. No internal orbital pathology. CTA NECK FINDINGS Aortic arch: Aortic atherosclerosis. No aneurysm or dissection. Branching pattern is normal without origin stenosis. Right carotid system: Common carotid artery widely patent to the bifurcation.  Minimal plaque at the bifurcation but no stenosis. Cervical ICA widely patent. Left carotid system: Common carotid artery widely patent to the bifurcation. Minimal plaque at the bifurcation but no stenosis. Cervical ICA widely patent to the skull base. Vertebral arteries: No vertebral artery origin stenosis. Both vertebral arteries appear normal through the cervical region to the foramen magnum. Skeleton: Ordinary cervical spondylosis. Other neck: No upper cervical mass or lymphadenopathy. Bilateral supraclavicular enlarged nodes. Upper chest: Prominent paratracheal lymph nodes unchanged since the previous study. Nodes are  consistent with the patient's history of B-cell lymphoma. Review of the MIP images confirms the above findings CTA HEAD FINDINGS Anterior circulation: Both internal carotid arteries patent through the skull base and siphon regions. The anterior and middle cerebral vessels are patent without large or medium vessel occlusion or proximal stenosis. No aneurysm or vascular malformation. Posterior circulation: Both vertebral arteries patent to the basilar. No basilar stenosis. Posterior circulation branch vessels appear normal. Venous sinuses: Patent normal. Anatomic variants: None significant. Review of the MIP images confirms the above findings IMPRESSION: 1. Acute infarction in the right basal ganglia and adjacent white matter tracks with mild swelling but no hemorrhage or significant mass effect. 2. No intracranial large or medium vessel occlusion or correctable proximal stenosis. 3. Minimal atherosclerotic change at both carotid bifurcations but no stenosis. 4. Bilateral supraclavicular and paratracheal adenopathy as seen on the previous study consistent with the patient's history of B-cell lymphoma. Aortic Atherosclerosis (ICD10-I70.0). Electronically Signed   By: Nelson Chimes M.D.   On: 12/09/2019 19:47   CT ANGIO NECK W OR WO CONTRAST  Result Date: 12/09/2019 CLINICAL DATA:  Left-sided weakness and speech disturbance over the last several days. MRI shows right deep brain infarction. History of B-cell lymphoma EXAM: CT ANGIOGRAPHY HEAD AND NECK TECHNIQUE: Multidetector CT imaging of the head and neck was performed using the standard protocol during bolus administration of intravenous contrast. Multiplanar CT image reconstructions and MIPs were obtained to evaluate the vascular anatomy. Carotid stenosis measurements (when applicable) are obtained utilizing NASCET criteria, using the distal internal carotid diameter as the denominator. CONTRAST:  43mL OMNIPAQUE IOHEXOL 350 MG/ML SOLN COMPARISON:  MRI 12/09/2019.  CT 09/13/2019 FINDINGS: CT HEAD Brain: Acute infarction evident in the right basal ganglia and adjacent white matter tracks with mild swelling but no hemorrhage or significant mass effect. Chronic small-vessel ischemic changes of the pons. Old right thalamic lacunar infarction. Small-vessel ischemic changes of the white matter. Vascular: No abnormal vascular finding. Skull: Negative Sinuses: Clear Orbits: Old surgery left lateral orbital rim. No internal orbital pathology. CTA NECK FINDINGS Aortic arch: Aortic atherosclerosis. No aneurysm or dissection. Branching pattern is normal without origin stenosis. Right carotid system: Common carotid artery widely patent to the bifurcation. Minimal plaque at the bifurcation but no stenosis. Cervical ICA widely patent. Left carotid system: Common carotid artery widely patent to the bifurcation. Minimal plaque at the bifurcation but no stenosis. Cervical ICA widely patent to the skull base. Vertebral arteries: No vertebral artery origin stenosis. Both vertebral arteries appear normal through the cervical region to the foramen magnum. Skeleton: Ordinary cervical spondylosis. Other neck: No upper cervical mass or lymphadenopathy. Bilateral supraclavicular enlarged nodes. Upper chest: Prominent paratracheal lymph nodes unchanged since the previous study. Nodes are consistent with the patient's history of B-cell lymphoma. Review of the MIP images confirms the above findings CTA HEAD FINDINGS Anterior circulation: Both internal carotid arteries patent through the skull base and siphon regions. The anterior and middle cerebral vessels are patent without  large or medium vessel occlusion or proximal stenosis. No aneurysm or vascular malformation. Posterior circulation: Both vertebral arteries patent to the basilar. No basilar stenosis. Posterior circulation branch vessels appear normal. Venous sinuses: Patent normal. Anatomic variants: None significant. Review of the MIP images  confirms the above findings IMPRESSION: 1. Acute infarction in the right basal ganglia and adjacent white matter tracks with mild swelling but no hemorrhage or significant mass effect. 2. No intracranial large or medium vessel occlusion or correctable proximal stenosis. 3. Minimal atherosclerotic change at both carotid bifurcations but no stenosis. 4. Bilateral supraclavicular and paratracheal adenopathy as seen on the previous study consistent with the patient's history of B-cell lymphoma. Aortic Atherosclerosis (ICD10-I70.0). Electronically Signed   By: Nelson Chimes M.D.   On: 12/09/2019 19:47   MR BRAIN WO CONTRAST  Result Date: 12/09/2019 CLINICAL DATA:  Focal neuro deficit, greater than 6 hours, stroke suspected. Additional history provided: Increased weakness to left side and difficulty speaking for 3 days, unable to walk for the past 3 days, history of prior CVA with residual left-sided deficits. EXAM: MRI HEAD WITHOUT CONTRAST TECHNIQUE: Multiplanar, multiecho pulse sequences of the brain and surrounding structures were obtained without intravenous contrast. COMPARISON:  Non-contrast head CT 09/13/2019, brain MRI 08/30/2019 FINDINGS: Brain: The exam is intermittently motion degraded. Most notably, there is moderate motion degradation of the sagittal T1 weighted sequence. There is a focus of restricted diffusion within the right basal ganglia which involves the right caudate and lentiform nuclei as well as internal capsule, measuring 2.6 x 2.0 x 2.9 cm (series 3, images 20 3-33). Findings are consistent with acute infarction. Corresponding T2/FLAIR hyperintensity at this site. No evidence of acute infarct elsewhere within the brain. Redemonstrated chronic lacunar infarct within the paramedian right pontomedullary junction (series 6, image 7). Small chronic lacunar infarct within the left basal ganglia involving the left caudate and lentiform nuclei. Chronic lacunar infarct within the right thalamus.  Additional chronic lacunar infarcts within the right cerebellar white matter. These infarcts were not present on prior MRI 08/30/2019. There is no evidence of intracranial mass. No midline shift or extra-axial fluid collection. No chronic intracranial blood products. Cerebral volume is normal. Vascular: Flow voids maintained within the proximal large arterial vessels. Skull and upper cervical spine: No focal marrow lesion. Incompletely assessed upper cervical spondylosis. Sinuses/Orbits: Susceptibility artifact arising from surgical fixation hardware along the lateral left orbit. No acute orbital abnormality is demonstrated. Mild paranasal sinus mucosal thickening. Trace fluid within inferior left mastoid air cells. IMPRESSION: 2.9 cm acute infarct within the right basal ganglia involving the right caudate and lentiform nuclei, as well as internal capsule. Chronic lacunar infarcts within the left basal ganglia, right thalamus and right cerebellar white matter. These infarcts were not present on prior MRI 09/13/2019. Redemonstrated chronic lacunar infarct within the paramedian right pontomedullary junction. Electronically Signed   By: Kellie Simmering DO   On: 12/09/2019 15:09   DG Chest Portable 1 View  Result Date: 12/09/2019 CLINICAL DATA:  Left-sided weakness and dysarthria for the past 3 days. EXAM: PORTABLE CHEST 1 VIEW COMPARISON:  09/25/2019 FINDINGS: The cardiac silhouette remains borderline enlarged. Clear lungs with normal vascularity. Thoracic spine degenerative changes. IMPRESSION: No acute abnormality. Electronically Signed   By: Claudie Revering M.D.   On: 12/09/2019 14:24    PHYSICAL EXAM  Temp:  [98.7 F (37.1 C)-99.9 F (37.7 C)] 98.7 F (37.1 C) (04/20 1000) Pulse Rate:  [62-85] 73 (04/20 1000) Resp:  [7-25] 20 (04/20 1000) BP: (  134-174)/(79-99) 147/99 (04/20 1000) SpO2:  [98 %-100 %] 98 % (04/20 1000) Weight:  [68.9 kg] 68.9 kg (04/19 1613)  General - Well nourished, well developed, in  no apparent distress.  Ophthalmologic - fundi not visualized due to noncooperation.  Cardiovascular - Regular rhythm and rate.  Mental Status -  Level of arousal and orientation to year, place, and person were intact, but not able to tell me the months. Significant flat affect, abulia, able to answer question appropriately, following all simple commands.  Able to name 2/2 and repeat.  Cranial Nerves II - XII - II - Visual field intact OU. III, IV, VI - Extraocular movements intact. V - Facial sensation intact bilaterally. VII - left facial droop. VIII - Hearing & vestibular intact bilaterally. X - Palate elevates symmetrically. XI - Chin turning & shoulder shrug intact bilaterally. XII - Tongue protrusion intact.  Motor Strength - The patient's strength was normal in right upper extremity and at least 3/5 lower extremity proximal and 4/5 distally, however left upper extremity 0/5 deltoid, bicep and tricep, 2/5 finger grip.  Left lower extremity 2/5 withdrawal to pain but not against gravity.  Bulk was normal and fasciculations were absent.   Motor Tone - Muscle tone was assessed at the neck and appendages and was normal.  Reflexes - The patient's reflexes were symmetrical in all extremities and he had no pathological reflexes.  Sensory - Light touch, temperature/pinprick were assessed and were symmetrical.    Coordination - The patient had normal movements in the right hand with no ataxia or dysmetria.  Tremor was absent.  Gait and Station - deferred.   ASSESSMENT/PLAN [name] [name] is a 64 y.o. male with history of stroke Jan 2021 enrolled in [facility] trial, HTN, tobacco abuse and neck pain presenting with fall 1 week ago with increased L sided weakness, less talkative, flat affect.   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 Old L basal ganglia, R thalamic, R  cerebellar white matter infarcts not seen on MRI in Jan 2021. Previously seen paramedian R pontomedullary jxn infarct.   CTA head & neck R basal ganglia and white matter infarct. B ICA bifurcation minimal atherosclerosis. B supraclavicular and paratracheal adenopathy c/w hx B-cell lymphoma.  MRI repeat pending per [facility] trial protocol  LDL 75  HgbA1c 7.0  UDS pending   lovenox for VTE prophylaxis  clopidogrel 75 mg daily and [employer] investigational study medication daily prior to admission, now will stop investigational drug. Will continue plavix along with aspirin 81 for a total of 3 weeks then plavix alone.   Therapy recommendations:  pending   Disposition:  pending   History of Stroke/TIA  11/2019 - this admission new acute R basal ganglia infarct. Per imaging, new chronic L basal ganglia, R thalamic, R cerebellar white matter infarcts since Jan 2021  08/29/2019 - left sided weakness, MRI showed acute R paramedian pontine infarct, secondary to small vessel disease.  CTA head and neck no LVO, consistent with lymphoma.  EF 60 to 65%.  LDL 85 and A1c 5.9, UDS negative.  Enrolled in [employer] stroke prevention trial.  Discharged with Plavix and investigational medication.  As well as Lipitor 20.  Hypertension  Stable . Permissive hypertension (OK if < 220/120) but gradually normalize in 5-7 days . 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 Please call with questions. Pt will follow up with stroke clinic Dr. Leonie Man at [facility] in about 4 weeks. Thanks for the consult.  Rosalin Hawking, MD PhD Stroke Neurology 12/10/2019 2:02 PM    To contact Stroke Continuity provider, please refer to [email or web address] After hours, contact General Neurology

## 2019-12-10 NOTE — Progress Notes (Signed)
Orthopedic Tech Progress Note Patient Details:  [name] [name] 24-Feb-1956 [id number] Called in order to [employer] for a RESTING HAND SPLINT  Patient ID: [name] [name], male   DOB: 1956/07/20, 64 y.o.   MRN: [id number]   [name] [name] 12/10/2019, 2:42 PM

## 2019-12-11 ENCOUNTER — Inpatient Hospital Stay: Payer: Self-pay | Admitting: Neurology

## 2019-12-11 ENCOUNTER — Telehealth: Payer: Self-pay

## 2019-12-11 MED ORDER — POTASSIUM CHLORIDE CRYS ER 20 MEQ PO TBCR
40.0000 meq | EXTENDED_RELEASE_TABLET | Freq: Once | ORAL | Status: AC
  Administered 2019-12-11: 40 meq via ORAL
  Filled 2019-12-11: qty 2

## 2019-12-11 MED ORDER — POTASSIUM CHLORIDE CRYS ER 20 MEQ PO TBCR
40.0000 meq | EXTENDED_RELEASE_TABLET | ORAL | Status: AC
  Administered 2019-12-11: 40 meq via ORAL
  Filled 2019-12-11: qty 2

## 2019-12-11 NOTE — Plan of Care (Signed)
Patient able to assist getting in and out of bed, oriented, will cont to monitor.  Flacid Left side

## 2019-12-11 NOTE — Progress Notes (Signed)
PROGRESS NOTE    [name] [name]  [id number] DOB: Sep 23, 1955 DOA: 12/09/2019 PCP: Bernerd Limbo, MD   Brief Narrative: 64 y.o m with history of hypertension, tobacco abuse, previous stroke, diabetes mellitus diet-controlled, CLL/small B-cell lymphoma followed by Dr. Arbie Cookey last chemo 05/2019 presented to ED 12/09/19 with left-sided weakness and slurred speech x 3 days and getting worse. In the ED MRI brain showed 2.9 cm acute infarct within the right basal ganglia involving the right caudate and lentiform nuclei as well as internal capsule, neurology was consulted and patient was admitted for further management.  Patient is out of TPA window given onset 3 days ago  Subjective:  Remains aphasic.  Unable to follow commands persistently.  No nausea no vomiting.  No acute events overnight. . Assessment & Plan:  Acute ischemic infarct involving right BG/Caudate and lentiform nuclei: sleepy this am. Passed swallow eval yesterday. Neurology  On board. Will complete the stroke work-up as below with, LDL 75 goal less than 70, A1c 7.0, CTA head and neck-B ICA bifurcation minimal atelectasis, B supraclavicular and paratracheal adenopathy consistent with B-cell lymphoma. PT OT speech eval and further plan as per neurology.  Continue Plavix,asaa, lipitor increased to 40 from 20 mg and stop experimental drug that he is on further drug trial per pharmacy, risk modification strategy.  CLL/B-cell lymphoma followed on outpatient basis.   Iron deficiency anemia:, Iron low at 37.  Hb stable in 11. follow-up outpatient with oncology   DM2 diet controlled with stable A1c at 5.9 in January/2021  Benign essential HTN: Stable, allow permissive hypertension  GERD-continue PPI   Hypokalemia: Repleting. Mag stable.  DVT prophylaxis:SCD- Lovenos per  Neuro Code Status: full Family Communication: plan of care discussed with son at phone.  Patient was not communicative not responding and did not interact  Status  is: Inpatient Remains inpatient appropriate because:Inpatient level of care appropriate due to severity of illness  Dispo: The patient is from: Home              Anticipated d/c is to: SNF              Anticipated d/c date is: 1 day              Patient currently is medically stable to d/c. Nutrition: Diet Order            Diet heart healthy/carb modified Room service appropriate? Yes; Fluid consistency: Thin  Diet effective now             Body mass index is 23.81 kg/m. Consultants: Neurology Procedures:see note Microbiology:see note  Medications: Scheduled Meds: .  stroke: mapping our early stages of recovery book   Does not apply Once  . aspirin EC  81 mg Oral Daily  . atorvastatin  40 mg Oral Daily  . clopidogrel  75 mg Oral Daily  . enoxaparin (LOVENOX) injection  40 mg Subcutaneous Q24H   Continuous Infusions:  Antimicrobials: Anti-infectives (From admission, onward)   None       Objective: Vitals: Today's Vitals   12/11/19 1300 12/11/19 1619 12/11/19 1639 12/11/19 2041  BP:  (!) 168/100  (!) 147/117  Pulse:  83  87  Resp:  20  20  Temp:  98.2 F (36.8 C)  99.2 F (37.3 C)  TempSrc:  Oral    SpO2:  98%  100%  Weight:      Height:      PainSc: 0-No pain  0-No pain  Intake/Output Summary (Last 24 hours) at 12/11/2019 2043 Last data filed at 12/11/2019 1735 Gross per 24 hour  Intake 490 ml  Output 440 ml  Net 50 ml   Filed Weights   12/09/19 1613  Weight: 68.9 kg   Weight change:    Intake/Output from previous day: 04/20 0701 - 04/21 0700 In: 240 [P.O.:240] Out: -  Intake/Output this shift: No intake/output data recorded.  Examination:  General exam: Eyes closed. Opens with touch pain ful stimuli " stop that" when given noxious stimuli. HEENT:Oral mucosa moist, Ear/Nose WNL grossly,dentition normal. Respiratory system: bilaterally clear,no wheezing or crackles,no use of accessory muscle, non tender. Cardiovascular system: S1 & S2 +,  regular, No JVD. Gastrointestinal system: Abdomen soft, NT,ND, BS+. Nervous System: sleepy. moving RLE not left  Extremities: No edema, distal peripheral pulses palpable.  Skin: No rashes,no icterus. MSK: Normal muscle bulk,tone, power  Data Reviewed: I have personally reviewed following labs and imaging studies CBC: Recent Labs  Lab 12/09/19 1607 12/10/19 0448  WBC 6.0 5.2  NEUTROABS 4.8 3.9  HGB 10.9* 11.3*  HCT 32.2* 32.8*  MCV 74.9* 74.0*  PLT 320 123456   Basic Metabolic Panel: Recent Labs  Lab 12/09/19 1607 12/09/19 1905 12/10/19 0448  NA 138  --  136  K 3.2*  --  3.3*  CL 104  --  104  CO2 25  --  20*  GLUCOSE 104*  --  97  BUN 6*  --  6*  CREATININE 0.80  --  0.79  CALCIUM 9.6  --  9.6  MG  --  1.8  --    GFR: Estimated Creatinine Clearance: 88.4 mL/min (by C-G formula based on SCr of 0.79 mg/dL). Liver Function Tests: Recent Labs  Lab 12/09/19 1607  AST 9*  ALT 9  ALKPHOS 67  BILITOT 1.8*  PROT 6.8  ALBUMIN 3.7   No results for input(s): LIPASE, AMYLASE in the last 168 hours. No results for input(s): AMMONIA in the last 168 hours. Coagulation Profile: No results for input(s): INR, PROTIME in the last 168 hours. Cardiac Enzymes: No results for input(s): CKTOTAL, CKMB, CKMBINDEX, TROPONINI in the last 168 hours. BNP (last 3 results) No results for input(s): PROBNP in the last 8760 hours. HbA1C: Recent Labs    12/10/19 0448  HGBA1C 7.0*   CBG: No results for input(s): GLUCAP in the last 168 hours. Lipid Profile: Recent Labs    12/10/19 0448  CHOL 120  HDL 34*  LDLCALC 75  TRIG 57  CHOLHDL 3.5   Thyroid Function Tests: No results for input(s): TSH, T4TOTAL, FREET4, T3FREE, THYROIDAB in the last 72 hours. Anemia Panel: Recent Labs    12/09/19 2200  FERRITIN 200  TIBC 312  IRON 37*   Sepsis Labs: No results for input(s): PROCALCITON, LATICACIDVEN in the last 168 hours.  Recent Results (from the past 240 hour(s))  SARS  CORONAVIRUS 2 (TAT 6-24 HRS) Nasopharyngeal Nasopharyngeal Swab     Status: None   Collection Time: 12/09/19  4:05 PM   Specimen: Nasopharyngeal Swab  Result Value Ref Range Status   SARS Coronavirus 2 NEGATIVE NEGATIVE Final    Comment: (NOTE) SARS-CoV-2 target nucleic acids are NOT DETECTED. The SARS-CoV-2 RNA is generally detectable in upper and lower respiratory specimens during the acute phase of infection. Negative results do not preclude SARS-CoV-2 infection, do not rule out co-infections with other pathogens, and should not be used as the sole basis for treatment or other patient management decisions. Negative  results must be combined with clinical observations, patient history, and epidemiological information. The expected result is Negative. Fact Sheet for Patients: SugarRoll.be Fact Sheet for Healthcare Providers: https://www.woods-mathews.com/ This test is not yet approved or cleared by the [place] FDA and  has been authorized for detection and/or diagnosis of SARS-CoV-2 by FDA under an Emergency Use Authorization (EUA). This EUA will remain  in effect (meaning this test can be used) for the duration of the COVID-19 declaration under Section 56 4(b)(1) of the Act, 21 U.S.C. section 360bbb-3(b)(1), unless the authorization is terminated or revoked sooner. Performed at [facility], [place] [address], [place], [place] [address]       Radiology Studies: MR BRAIN WO CONTRAST  Result Date: 12/10/2019 CLINICAL DATA:  Stroke follow-up EXAM: MRI HEAD WITHOUT CONTRAST TECHNIQUE: Multiplanar, multiecho pulse sequences of the brain and surrounding structures were obtained without intravenous contrast. COMPARISON:  Brain MRI 12/09/2019 FINDINGS: Brain: Unchanged area of abnormal diffusion restriction in the right basal ganglia. No new site of ischemia. Multifocal white matter hyperintensity, most commonly due to chronic ischemic  microangiopathy. Normal volume of CSF spaces. No chronic microhemorrhage. Normal midline structures. Vascular: Unremarkable Skull and upper cervical spine: Normal marrow signal. Sinuses/Orbits: Negative. Other: None. IMPRESSION: Unchanged area of abnormal diffusion restriction in the right basal ganglia, consistent with subacute ischemia. No new site of ischemia. No hemorrhage or mass effect. Electronically Signed   By: Ulyses Jarred M.D.   On: 12/10/2019 19:24     LOS: 2 days   Time spent: More than 50% of that time was spent in counseling and/or coordination of care.  Berle Mull, MD [facility]  12/11/2019, 8:43 PM

## 2019-12-11 NOTE — TOC Initial Note (Addendum)
Transition of Care [facility]) - Initial/Assessment Note    Patient Details  Name: [name] [name] MRN: [id number] Date of Birth: 08-13-1956  [facility]) CM/SW Contact:    Kirstie Peri, [place] Work Phone Number: 12/11/2019, 11:26 AM  Clinical Narrative:                 MSW Intern spoke with pt's son Cecilie Lowers to discuss SNF placement and discharge planning. Cecilie Lowers was agreeable to SNF and said that they could offer care that he could not. He noted that he had no preferences and would like for the pt to be faxed out for options. Cecilie Lowers said that the pt had been living with him and the pt's mother in the pt's house up until hospitalization. SW will continue to follow.  Addended 12/11/19 4:10 pm Bed offers emailed to son, craig. Will follow up with bed choice.  Expected Discharge Plan: Skilled Nursing Facility Barriers to Discharge: Insurance Authorization, SNF Pending bed offer   Patient Goals and CMS Choice Patient states their goals for this hospitalization and ongoing recovery are:: Pt unable to participate in goal setting due to disorientation. CMS Medicare.gov Compare Post Acute Care list provided to:: Patient Represenative (must comment) Choice offered to / list presented to : Adult Children  Expected Discharge Plan and Services Expected Discharge Plan: [place] Acute Care Choice: [place] arrangements for the past 2 months: Single Family Home                                      Prior Living Arrangements/Services Living arrangements for the past 2 months: Single Family Home Lives with:: Adult Children, Parents Patient language and need for interpreter reviewed:: Yes Do you feel safe going back to the place where you live?: Yes      Need for Family Participation in Patient Care: Yes (Comment) Care giver support system in place?: Yes (comment)   Criminal Activity/Legal Involvement Pertinent to Current  Situation/Hospitalization: No - Comment as needed  Activities of Daily Living      Permission Sought/Granted Permission sought to share information with : Facility Sport and exercise psychologist, Family Supports Permission granted to share information with : Yes, Verbal Permission Granted  Share Information with NAME: Kingstin Asare     Permission granted to share info w Relationship: Son  Permission granted to share info w Contact Information: [phone]  Emotional Assessment Appearance:: Other (Comment Required(Unable to Assess) Attitude/Demeanor/Rapport: Unable to Assess Affect (typically observed): Unable to Assess Orientation: : Oriented to Self, Oriented to Place Alcohol / Substance Use: Not Applicable Psych Involvement: No (comment)  Admission diagnosis:  CVA (cerebral vascular accident) ([place]) [I63.9] Cerebrovascular accident (CVA), unspecified mechanism ([place]) [I63.9] Patient Active Problem List   Diagnosis Date Noted  . GERD (gastroesophageal reflux disease) 12/09/2019  . Hypokalemia 12/09/2019  . Abnormality of gait 10/10/2019  . Temperature elevated   . Labile blood pressure   . Diffuse large B-cell lymphoma ([place])   . Sleep disturbance   . History of hypertension   . Opacity of lung on imaging study   . Fever   . Reactive depression   . Benign essential HTN   . Labile blood glucose   . Acute on chronic anemia   . Lymphopenia   . Hypoalbuminemia due to protein-calorie malnutrition ([place])   . Controlled type 2 diabetes mellitus with hyperglycemia,  without long-term current use of insulin ([place])   . CVA (cerebral vascular accident) ([place]) 09/03/2019  . Acute ischemic stroke ([place]) 08/29/2019  . Hyponatremia 08/29/2019  . HTN (hypertension) 08/29/2019  . DM2 (diabetes mellitus, type 2) ([place]) 08/29/2019  . CLL (chronic lymphocytic leukemia) ([place]) 04/10/2019  . Counseling regarding advance care planning and goals of care 04/10/2019  . Deviated septum 05/18/2018  . Iron  deficiency anemia 02/26/2018   PCP:  Bernerd Limbo, MD Pharmacy:   [facility] [phone] [name], [address] AT [facility] [address] [place] [address] Phone: [phone] Fax: [phone]     Social Determinants of Health ([place]) Interventions    Readmission Risk Interventions No flowsheet data found.

## 2019-12-11 NOTE — Progress Notes (Signed)
Physical Therapy Treatment Patient Details Name: [name] [name] MRN: [id number] DOB: 02/17/1956 Today's Date: 12/11/2019    History of Present Illness 64 yo male admitted with sluured speech and L side weakness. MRI (+) 2.9 cm acute infarct with R basal ganglia involving R caudate and lentiform nuclei as well as internal capsule.  PMH HTN tobacco abuse, CVA DM2 CLL/small Bcell lymphoma chemo therapy 05/2019    PT Comments    Patient slightly more lethargic and less verbal compared to 4/20 session. Was able to advance to walking x 7 ft with 2 person moderate assist due to left lean and poor control of LLE. By end of session, pt up in recliner and talking to his fiance on his cellphone (she called him; PT answered phone for him and he was then able to hold it).     Follow Up Recommendations  SNF;Supervision/Assistance - 24 hour     Equipment Recommendations  Other (comment)(TBA at next venue)    Recommendations for Other Services       Precautions / Restrictions Precautions Precautions: Fall Precaution Comments: resting hand splint for night time wear Restrictions Weight Bearing Restrictions: No    Mobility  Bed Mobility Overal bed mobility: Needs Assistance Bed Mobility: Rolling;Sidelying to Sit Rolling: Mod assist(to his left) Sidelying to sit: Mod assist       General bed mobility comments: required max cues for sequencing to roll left (in additon to physical assist); using LUE on rail for rolling and to initiate side to sit  Transfers Overall transfer level: Needs assistance Equipment used: 2 person hand held assist Transfers: Sit to/from Stand;Stand Pivot Transfers Sit to Stand: +2 physical assistance;Min assist         General transfer comment: pt able to power up with cues and bil HHA; left lean with left knee in slight flexion (not buckling, but unable to fully extend on his own; from bed and recliner  Ambulation/Gait Ambulation/Gait assistance: Mod assist;+2  physical assistance;+2 safety/equipment Gait Distance (Feet): 7 Feet Assistive device: 2 person hand held assist Gait Pattern/deviations: Step-to pattern;Decreased step length - left;Decreased weight shift to right;Staggering left     General Gait Details: LLE tends to adduct as pt advances it; pt unable to redirect LLE with cues and requires assist for LLE placement; as we approached countertop on his left, pt activated shoulder and elbow to raise arm and nearly able to place hand on the counter; assist for weight-shifting over RLE as pt with left lean   Stairs             Wheelchair Mobility    Modified Rankin (Stroke Patients Only) Modified Rankin (Stroke Patients Only) Pre-Morbid Rankin Score: Moderately severe disability Modified Rankin: Moderately severe disability     Balance Overall balance assessment: Needs assistance Sitting-balance support: Single extremity supported;Feet supported Sitting balance-Leahy Scale: Fair   Postural control: Left lateral lean Standing balance support: Single extremity supported;During functional activity Standing balance-Leahy Scale: Poor Standing balance comment: reliant on therapist. Pt will requires L Ue platform or Eva walker to support                             Cognition Arousal/Alertness: Lethargic Behavior During Therapy: Flat affect Overall Cognitive Status: Impaired/Different from baseline Area of Impairment: Following commands;Awareness;Attention                   Current Attention Level: Sustained   Following Commands: Follows one  step commands consistently;Follows one step commands with increased time   Awareness: Intellectual   General Comments: Much less verbal with more frequent eye closing. Improved during session      Exercises      General Comments General comments (skin integrity, edema, etc.): maintains neck flexion in sitting and standing; is able to nearly achieve midline/neutral  and with head supported can initiate left cervical rotation      Pertinent Vitals/Pain Pain Assessment: Faces Faces Pain Scale: Hurts even more Pain Location: finger digits Pain Descriptors / Indicators: Grimacing;Discomfort Pain Intervention(s): Limited activity within patient's tolerance;Monitored during session;Repositioned(fingers with ROM)    Home Living                      Prior Function            PT Goals (current goals can now be found in the care plan section) Acute Rehab PT Goals Patient Stated Goal: patient does not state. Fiance states "he needs more rehab" Time For Goal Achievement: 12/24/19 Potential to Achieve Goals: Good Progress towards PT goals: Progressing toward goals    Frequency    Min 3X/week      PT Plan Current plan remains appropriate    Co-evaluation              AM-PAC PT "6 Clicks" Mobility   Outcome Measure  Help needed turning from your back to your side while in a flat bed without using bedrails?: A Lot Help needed moving from lying on your back to sitting on the side of a flat bed without using bedrails?: A Lot Help needed moving to and from a bed to a chair (including a wheelchair)?: A Lot Help needed standing up from a chair using your arms (e.g., wheelchair or bedside chair)?: A Little Help needed to walk in hospital room?: A Lot Help needed climbing 3-5 steps with a railing? : A Lot 6 Click Score: 13    End of Session Equipment Utilized During Treatment: Gait belt Activity Tolerance: Patient tolerated treatment well Patient left: in chair;with call bell/phone within reach;with chair alarm set Nurse Communication: Mobility status PT Visit Diagnosis: Hemiplegia and hemiparesis;Other abnormalities of gait and mobility (R26.89);Unsteadiness on feet (R26.81) Hemiplegia - Right/Left: Left Hemiplegia - dominant/non-dominant: Non-dominant Hemiplegia - caused by: Cerebral infarction     Time: [id number] PT Time  Calculation (min) (ACUTE ONLY): 28 min  Charges:  $Gait Training: 8-22 mins $Neuromuscular Re-education: 8-22 mins                      [name] [name], PT Pager [phone]    [name] [name] 12/11/2019, 11:43 AM

## 2019-12-11 NOTE — NC FL2 (Signed)
[place] LEVEL OF CARE SCREENING TOOL     IDENTIFICATION  Patient Name: [name] [name] Birthdate: 07-07-1956 Sex: male Admission Date (Current Location): 12/09/2019  [facility] and [place] Number:  Herbalist and Address:  [facility], [place] [address] [place], [place] [address]      Provider Number: [id number]  Attending Physician Name and Address:  Lavina Hamman, MD  Relative Name and Phone Number:       Current Level of Care: Hospital Recommended Level of Care: [place] Prior Approval Number:    Date Approved/Denied:   PASRR Number: [id number]  Discharge Plan: SNF    Current Diagnoses: Patient Active Problem List   Diagnosis Date Noted  . GERD (gastroesophageal reflux disease) 12/09/2019  . Hypokalemia 12/09/2019  . Abnormality of gait 10/10/2019  . Temperature elevated   . Labile blood pressure   . Diffuse large B-cell lymphoma ([place])   . Sleep disturbance   . History of hypertension   . Opacity of lung on imaging study   . Fever   . Reactive depression   . Benign essential HTN   . Labile blood glucose   . Acute on chronic anemia   . Lymphopenia   . Hypoalbuminemia due to protein-calorie malnutrition ([place])   . Controlled type 2 diabetes mellitus with hyperglycemia, without long-term current use of insulin ([place])   . CVA (cerebral vascular accident) ([place]) 09/03/2019  . Acute ischemic stroke ([place]) 08/29/2019  . Hyponatremia 08/29/2019  . HTN (hypertension) 08/29/2019  . DM2 (diabetes mellitus, type 2) ([place]) 08/29/2019  . CLL (chronic lymphocytic leukemia) ([place]) 04/10/2019  . Counseling regarding advance care planning and goals of care 04/10/2019  . Deviated septum 05/18/2018  . Iron deficiency anemia 02/26/2018    Orientation RESPIRATION BLADDER Height & Weight     Self, Place  Normal Incontinent Weight: 152 lb (68.9 kg) Height:  5\' 7"  (170.2 cm)  BEHAVIORAL SYMPTOMS/MOOD NEUROLOGICAL BOWEL  NUTRITION STATUS      Incontinent Diet(see DC Summary)  AMBULATORY STATUS COMMUNICATION OF NEEDS Skin   Extensive Assist Verbally Normal                       Personal Care Assistance Level of Assistance  Bathing, Feeding, Dressing Bathing Assistance: Maximum assistance Feeding assistance: Limited assistance Dressing Assistance: Maximum assistance     Functional Limitations Info  Speech     Speech Info: Impaired(expressive aphasia)    SPECIAL CARE FACTORS FREQUENCY  PT (By licensed PT), OT (By licensed OT), Speech therapy     PT Frequency: 5x/wk OT Frequency: 5x/wk     Speech Therapy Frequency: 5x/wk      Contractures Contractures Info: Not present    Additional Factors Info  Code Status, Allergies Code Status Info: Full Allergies Info: Lisinopril, Tuberculin Tests           Current Medications (12/11/2019):  This is the current hospital active medication list Current Facility-Administered Medications  Medication Dose Route Frequency Provider Last Rate Last Admin  .  stroke: mapping our early stages of recovery book   Does not apply Once Pahwani, Rinka R, MD      . acetaminophen (TYLENOL) tablet 650 mg  650 mg Oral Q4H PRN Pahwani, Rinka R, MD       Or  . acetaminophen (TYLENOL) 160 MG/5ML solution 650 mg  650 mg Per Tube Q4H PRN Pahwani, Michell Heinrich, MD  Or  . acetaminophen (TYLENOL) suppository 650 mg  650 mg Rectal Q4H PRN Pahwani, Rinka R, MD      . aspirin EC tablet 81 mg  81 mg Oral Daily Biby, Sharon L, NP   81 mg at 12/11/19 0945  . atorvastatin (LIPITOR) tablet 40 mg  40 mg Oral Daily Rosalin Hawking, MD   40 mg at 12/11/19 0945  . clopidogrel (PLAVIX) tablet 75 mg  75 mg Oral Daily Pahwani, Rinka R, MD   75 mg at 12/11/19 0945  . enoxaparin (LOVENOX) injection 40 mg  40 mg Subcutaneous Q24H Rosalin Hawking, MD   40 mg at 12/10/19 1603  . iohexol (OMNIPAQUE) 350 MG/ML injection 75 mL  75 mL Intravenous Once PRN Marliss Coots, PA-C      . senna-docusate  (Senokot-S) tablet 1 tablet  1 tablet Oral QHS PRN Pahwani, Michell Heinrich, MD         Discharge Medications: Please see discharge summary for a list of discharge medications.  Relevant Imaging Results:  Relevant Lab Results:   Additional Information SS#: [id number]  Geralynn Ochs, LCSW

## 2019-12-11 NOTE — Telephone Encounter (Signed)
Patient no show for appt today. 

## 2019-12-11 NOTE — Progress Notes (Signed)
SLP Cancellation Note  Patient Details Name: [name] [name] MRN: [id number] DOB: 06-Jun-1956   Cancelled treatment:        Speech-language-cognitive assessment was attempted again today. Pt opened eyes upon therapist entering, and greeting then closed. Raised head of bed, frequent tactile and verbal stimuli. He opened eyes each time first name called and closed again and responded to questions with head gestures. Explained reason for visit several times - no response. Eventually when asked if he wants therapist to leave him alone, he nodded head.  Will attempt assessment once more.    Houston Siren 12/11/2019, 3:19 PM  Orbie Pyo Colvin Caroli.Ed Risk analyst [phone] Office [phone]

## 2019-12-12 LAB — COMPREHENSIVE METABOLIC PANEL
ALT: 8 U/L (ref 0–44)
AST: 8 U/L — ABNORMAL LOW (ref 15–41)
Albumin: 3.7 g/dL (ref 3.5–5.0)
Alkaline Phosphatase: 66 U/L (ref 38–126)
Anion gap: 10 (ref 5–15)
BUN: 9 mg/dL (ref 8–23)
CO2: 20 mmol/L — ABNORMAL LOW (ref 22–32)
Calcium: 9.6 mg/dL (ref 8.9–10.3)
Chloride: 104 mmol/L (ref 98–111)
Creatinine, Ser: 0.76 mg/dL (ref 0.61–1.24)
GFR calc Af Amer: >60 mL/min (ref >60)
GFR calc non Af Amer: >60 mL/min (ref >60)
Glucose, Bld: 112 mg/dL — ABNORMAL HIGH (ref 70–99)
Potassium: 3.7 mmol/L (ref 3.5–5.1)
Sodium: 134 mmol/L — ABNORMAL LOW (ref 135–145)
Total Bilirubin: 2 mg/dL — ABNORMAL HIGH (ref 0.3–1.2)
Total Protein: 6.9 g/dL (ref 6.5–8.1)

## 2019-12-12 LAB — CBC WITH DIFFERENTIAL/PLATELET
Abs Immature Granulocytes: 0.06 10*3/uL (ref 0.00–0.07)
Basophils Absolute: 0 10*3/uL (ref 0.0–0.1)
Basophils Relative: 1 %
Eosinophils Absolute: 0.1 10*3/uL (ref 0.0–0.5)
Eosinophils Relative: 1 %
HCT: 32.9 % — ABNORMAL LOW (ref 39.0–52.0)
Hemoglobin: 11.2 g/dL — ABNORMAL LOW (ref 13.0–17.0)
Immature Granulocytes: 1 %
Lymphocytes Relative: 8 %
Lymphs Abs: 0.5 10*3/uL — ABNORMAL LOW (ref 0.7–4.0)
MCH: 25.5 pg — ABNORMAL LOW (ref 26.0–34.0)
MCHC: 34 g/dL (ref 30.0–36.0)
MCV: 74.9 fL — ABNORMAL LOW (ref 80.0–100.0)
Monocytes Absolute: 0.6 10*3/uL (ref 0.1–1.0)
Monocytes Relative: 11 %
Neutro Abs: 4.3 10*3/uL (ref 1.7–7.7)
Neutrophils Relative %: 78 %
Platelets: 308 10*3/uL (ref 150–400)
RBC: 4.39 MIL/uL (ref 4.22–5.81)
RDW: 16 % — ABNORMAL HIGH (ref 11.5–15.5)
WBC: 5.6 10*3/uL (ref 4.0–10.5)
nRBC: 0 % (ref 0.0–0.2)

## 2019-12-12 MED ORDER — OXYCODONE-ACETAMINOPHEN 5-325 MG PO TABS: 2.0000 | ORAL_TABLET | Freq: Three times a day (TID) | ORAL | 0 refills | Status: DC | PRN

## 2019-12-12 MED ORDER — ASPIRIN 81 MG PO TBEC: 81.0000 mg | DELAYED_RELEASE_TABLET | Freq: Every day | ORAL | 0 refills | Status: AC

## 2019-12-12 NOTE — TOC Transition Note (Signed)
Transition of Care [facility]) - CM/SW Discharge Note   Patient Details  Name: [name] [name] MRN: [id number] Date of Birth: [birth year]  [facility]) CM/SW Contact:  Geralynn Ochs, LCSW Phone Number: 12/12/2019, 4:35 PM   Clinical Narrative:   Nurse to call report to [phone], Ask for [facility] Nurse    Final next level of care: [place] Barriers to Discharge: Barriers Resolved   Patient Goals and CMS Choice Patient states their goals for this hospitalization and ongoing recovery are:: Pt unable to participate in goal setting due to disorientation. CMS Medicare.gov Compare Post Acute Care list provided to:: Patient Represenative (must comment) Choice offered to / list presented to : Adult Children  Discharge Placement              Patient chooses bed at: [facility] Patient to be transferred to facility by: [place] Name of family member notified: Cecilie Lowers Patient and family notified of of transfer: 12/12/19  Discharge Plan and Services     Post Acute Care Choice: [place]                               Social Determinants of Health (SDOH) Interventions     Readmission Risk Interventions No flowsheet data found.

## 2019-12-12 NOTE — Evaluation (Signed)
Speech Language Pathology Evaluation Patient Details Name: [name] [name] MRN: [id number] DOB: 30-Jul-1956 Today's Date: 12/12/2019 Time: [id number] SLP Time Calculation (min) (ACUTE ONLY): 18 min  Problem List:  Patient Active Problem List   Diagnosis Date Noted  . GERD (gastroesophageal reflux disease) 12/09/2019  . Hypokalemia 12/09/2019  . Abnormality of gait 10/10/2019  . Temperature elevated   . Labile blood pressure   . 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 Sleep disturbance   . History of hypertension   . Opacity of lung on imaging study   . Fever   . Reactive depression   . Benign essential HTN   . Labile blood glucose   . Acute on chronic anemia   . Lymphopenia   . Hypoalbuminemia due to protein-calorie malnutrition ([place])   . Controlled type 2 diabetes mellitus with hyperglycemia, without long-term current use of insulin ([place])   . CVA (cerebral vascular accident) ([place]) 09/03/2019  . Acute ischemic stroke ([place]) 08/29/2019  . Hyponatremia 08/29/2019  . HTN (hypertension) 08/29/2019  . DM2 (diabetes mellitus, type 2) ([place]) 08/29/2019  . CLL (chronic lymphocytic leukemia) ([place]) 04/10/2019  . Counseling regarding advance care planning and goals of care 04/10/2019  . Deviated septum 05/18/2018  . Iron deficiency anemia 02/26/2018   Past Medical History:  Past Medical History:  Diagnosis Date  . Allergic rhinitis, cause unspecified   . Backache, unspecified   . Body mass index 33.0-33.9, adult   . Diabetes mellitus without complication ([place])    Type II  . Elevated blood pressure reading without diagnosis of hypertension   . Esophageal reflux   . Generalized pain   . Heartburn   . Insomnia, unspecified   . 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 Other abnormal blood chemistry   . Other and unspecified hyperlipidemia   . Other dyspnea and respiratory abnormality   . Other nonspecific findings on examination of  blood(790.99)   . Pain in joint, lower leg   . Pneumonia    1968  . Sleep apnea    Does not use or own a CPAP  . Tobacco use disorder   . Unspecified essential hypertension    Past Surgical History:  Past Surgical History:  Procedure Laterality Date  . bil wrist surgery    . BRONCHIAL BRUSHINGS  09/24/2019   Procedure: BRONCHIAL BRUSHINGS;  Surgeon: Candee Furbish, MD;  Location: [facility] ENDOSCOPY;  Service: Pulmonary;;  . BRONCHIAL WASHINGS  09/24/2019   Procedure: BRONCHIAL WASHINGS;  Surgeon: Candee Furbish, MD;  Location: [facility] ENDOSCOPY;  Service: Pulmonary;;  . ENDOSCOPIC CONCHA BULLOSA RESECTION Bilateral 05/18/2018   Procedure: ENDOSCOPIC CONCHA BULLOSA RESECTION;  Surgeon: Jerrell Belfast, MD;  Location: [place];  Service: ENT;  Laterality: Bilateral;  . NASAL SEPTOPLASTY W/ TURBINOPLASTY Bilateral 05/18/2018   Procedure: NASAL SEPTOPLASTY WITH TURBINATE REDUCTION;  Surgeon: Jerrell Belfast, MD;  Location: [place];  Service: ENT;  Laterality: Bilateral;  . ORIF FOREARM FRACTURE     right  . plastic surgery to face    . SINUS ENDO W/FUSION Bilateral 05/18/2018   Procedure: ENDOSCOPIC SINUS SURGERY WITH NAVIGATION;  Surgeon: Jerrell Belfast, MD;  Location: [place];  Service: ENT;  Laterality: Bilateral;  . VIDEO BRONCHOSCOPY N/A 09/24/2019   Procedure: VIDEO BRONCHOSCOPY WITHOUT FLUORO;  Surgeon: Candee Furbish, MD;  Location: [facility] ENDOSCOPY;  Service: Pulmonary;  Laterality: N/A;   HPI:  [name] [name] is a 64 y.o. male with medical history significant  of hypertension, tobacco abuse, previous stroke, diet-controlled diabetes mellitus, CLL/small B-cell lymphoma followed by Dr. Arbie Cookey with last chemotherapy on October 2020 presents to emergency department due to left-sided weakness and slurred speech since 3 days.  MRI on 4/19 reported: "2.9 cm acute infarct within the right basal ganglia involving the right caudate and lentiform nuclei, as well as internal capsule. Chronic lacunar infarcts within the  left basal ganglia, right thalamus and right cerebellar white matter.    Assessment / Plan / Recommendation Clinical Impression  Pt was seen for a cognitive-linguistic evaluation in the setting of an acute infarct.  He has a hx of moderate cognitive deficits and he recently participated in inpatient rehab ST in January and February of 2021.  Evaluation was abbreviated secondary to pt's lethargy.  He reported that he lives at home with his mother and that they are both fully independent with IADLs.  Per previous ST note on 09/27/19, family had planned to work on establishing 24/7 supervision, but no family was present to elaborate on the pt's current living situation.  Difficult to determine if there is a change in cognitive abilities during this admission, but pt continues to present with moderate deficits in attention, problem solving, short-term memory, safety judgement, and emergent awareness.  Speech intelligibility is mildly reduced (approximately 90%) secondary to low vocal intensity.  His articulation is precise.  Expressive and receptive language abilities appear to be functional.  Pt would benefit from additional ST targeting cognitive deficits and 24/7 supervision/assistance with IADLs at time of discharge.  SLP will f/u acutely.      SLP Assessment  SLP Recommendation/Assessment: Patient needs continued Speech Lanaguage Pathology Services SLP Visit Diagnosis: Cognitive communication deficit (R41.841)    Follow Up Recommendations  Inpatient Rehab;Other (comment);Skilled Nursing facility(defer to PT/OT recommendations )    Frequency and Duration min 2x/week  2 weeks      SLP Evaluation Cognition  Overall Cognitive Status: Difficult to assess Arousal/Alertness: Awake/alert Orientation Level: Oriented to person Attention: Sustained;Focused Focused Attention: Impaired Focused Attention Impairment: Verbal basic Sustained Attention: Impaired Sustained Attention Impairment: Verbal  basic Memory: Impaired Memory Impairment: Decreased short term memory Decreased Short Term Memory: Verbal basic Immediate Memory Recall: Sock;Blue;Bed Memory Recall Sock: Not able to recall Memory Recall Blue: Without Cue Memory Recall Bed: With Cue Awareness: Impaired Awareness Impairment: Emergent impairment Problem Solving: Impaired Problem Solving Impairment: Functional complex;Verbal complex Safety/Judgment: Impaired       Comprehension  Auditory Comprehension Overall Auditory Comprehension: Appears within functional limits for tasks assessed Reading Comprehension Reading Status: Not tested    Expression Expression Primary Mode of Expression: Verbal Verbal Expression Overall Verbal Expression: Appears within functional limits for tasks assessed Written Expression Dominant Hand: Right Written Expression: Not tested   Oral / Motor  Motor Speech Overall Motor Speech: Impaired Respiration: Within functional limits Phonation: Low vocal intensity Resonance: Within functional limits Articulation: Within functional limitis Intelligibility: Intelligibility reduced Word: 75-100% accurate Phrase: 75-100% accurate Sentence: 75-100% accurate Conversation: 75-100% accurate Motor Planning: Witnin functional limits   GO                   Colin Mulders., M.S., CCC-SLP Acute Rehabilitation Services Office: [phone]  [name] [place] 12/12/2019, 9:30 AM

## 2019-12-12 NOTE — Discharge Summary (Signed)
[facility] Discharge Summary   Patient: [name] [name] [id number]  PCP: Bernerd Limbo, MD  Date of admission: 12/09/2019   Date of discharge:  12/12/2019     Discharge Diagnoses:  Principal Problem:   CVA (cerebral vascular accident) [facility]) Active Problems:   Iron deficiency anemia   CLL (chronic lymphocytic leukemia) ([place])   DM2 (diabetes mellitus, type 2) ([place])   Benign essential HTN   Diffuse large B-cell lymphoma ([place])   GERD (gastroesophageal reflux disease)   Hypokalemia  Admitted From: home Disposition:  SNF   Recommendations for Outpatient Follow-up:  1. PCP: please follow up with PCP in 1 week and neurology as recommended  2. Follow up LABS/TEST:  none  Follow-up Information    Garvin Fila, MD. Schedule an appointment as soon as possible for a visit in 4 week(s).   Specialties: Neurology, Radiology Contact information: [address] [place] [address] [phone]        Bernerd Limbo, MD. Schedule an appointment as soon as possible for a visit in 1 week(s).   Specialty: Family Medicine Contact information: [place] [address] [phone]          Diet recommendation: Cardiac diet  Activity: The patient is advised to gradually reintroduce usual activities, as tolerated  Discharge Condition: stable  Code Status: Full code   History of present illness: As per the H and P dictated on admission, "[name] [name] is a 64 y.o. male with medical history significant of hypertension, tobacco abuse, previous stroke, diet-controlled diabetes mellitus, CLL/small B-cell lymphoma followed by Dr. Arbie Cookey with last chemotherapy on October 2020 presents to emergency department due to left-sided weakness and slurred speech since 3 days.  Patient tells me that his symptoms started 3 days ago and started getting worse.  No history of head trauma, seizures, loss of consciousness.    No  history of headache, blurry vision, facial droop, chest pain, shortness of breath, hypertension, leg swelling, fever, chills, cough, congestion, nausea, vomiting, diarrhea, decreased appetite or lethargy.  He lives with his mom and son at home.  No history of smoking, alcohol, illicit drug use. He is enrolled in [employer] stroke prevention trial."  Hospital Course:  Summary of his active problems in the hospital is as following. Acute ischemic infarct involving right BG/Caudate and lentiform nuclei: sleepy this am. Passed swallow eval yesterday. Neurology  On board. Will complete the stroke work-up as below with, LDL 75 goal less than 70, A1c 7.0, CTA head and neck-B ICA bifurcation minimal atelectasis, B supraclavicular and paratracheal adenopathy consistent with B-cell lymphoma. PT OT speech eval and further plan as per neurology.  Continue Plavix,asaa, lipitor increased to 40 from 20 mg and stop experimental drug that he is on further drug trial per pharmacy, risk modification strategy.  CLL/B-cell lymphoma followed on outpatient basis.   Iron deficiency anemia:, Iron low at 37.  Hb stable in 11. follow-up outpatient with oncology   DM2 diet controlled with stable A1c at 5.9 in January/2021  Benign essential HTN: Stable, allow permissive hypertension  GERD-continue PPI   Hypokalemia: Repleting. Mag stable.  Pain control  - [employer] Controlled Substance Reporting System database was reviewed. - 2 day supply was provided. - Patient was instructed, not to drive, operate heavy machinery, perform activities at heights, swimming or participation in water activities or provide baby sitting services while on Pain, Sleep and Anxiety Medications; until his  outpatient Physician has advised to do so again.  - Also recommended to not to take more than prescribed Pain, Sleep and Anxiety Medications.  Patient was seen by physical therapy, who recommended SNF, which  was arranged. On the day of the discharge the patient's vitals were stable, and no other acute medical condition were reported by patient. the patient was felt safe to be discharge at SNF with therapy.  Consultants: Neurology  Procedures: Echocardiogram   Discharge Exam: General: Appear in mild distress, no Rash; Oral Mucosa Clear, moist. Cardiovascular: S1 and S2 Present, no Murmur, Respiratory: normal respiratory effort, Bilateral Air entry present and no Crackles, no wheezes Abdomen: Bowel Sound present, Soft and no tenderness, no hernia Extremities: no Pedal edema, no calf tenderness Neurology: alert and oriented to time, place, and person affect flat in affect. Left facial droop. normal in right upper extremity and at least 3/5 lower extremity proximal and 4/5 distally, however left upper extremity 0/5 deltoid, bicep and tricep, 2/5 finger grip.  Left lower extremity 2/5 withdrawal to pain but not against gravity  Filed Weights   12/09/19 1613  Weight: 68.9 kg   Vitals:   12/12/19 0800 12/12/19 1213  BP: (!) 148/94 (!) 147/87  Pulse: 96 92  Resp: 17 19  Temp: 99.2 F (37.3 C) 98.8 F (37.1 C)  SpO2: 97% 98%    DISCHARGE MEDICATION: Allergies as of 12/12/2019      Reactions   Lisinopril Itching   Tuberculin Tests Swelling      Medication List    STOP taking these medications   Investigational - Study Medication   potassium chloride SA 20 MEQ tablet Commonly known as: KLOR-CON     TAKE these medications   acetaminophen 325 MG tablet Commonly known as: TYLENOL Take 2 tablets (650 mg total) by mouth every 4 (four) hours as needed for mild pain (or temp > 37.5 C (99.5 F)).   aspirin 81 MG EC tablet Take 1 tablet (81 mg total) by mouth daily for 21 days. Start taking on: December 13, 2019   atorvastatin 20 MG tablet Commonly known as: LIPITOR Take 1 tablet (20 mg total) by mouth daily.   clopidogrel 75 MG tablet Commonly known as: PLAVIX Take 1 tablet (75 mg  total) by mouth daily.   methylphenidate 5 MG tablet Commonly known as: RITALIN Take 1 tablet (5 mg total) by mouth daily.   oxyCODONE-acetaminophen 5-325 MG tablet Commonly known as: PERCOCET/ROXICET Take 2 tablets by mouth every 8 (eight) hours as needed (for pain). What changed: when to take this   pantoprazole 40 MG tablet Commonly known as: PROTONIX Take 1 tablet (40 mg total) by mouth at bedtime.      Allergies  Allergen Reactions  . Lisinopril Itching  . Tuberculin Tests Swelling   Discharge Instructions    Ambulatory referral to Neurology   Complete by: As directed    Follow up with [facility] Leonie [facility] at [facility] in 4-6 weeks. Too complicated for NP to follow. Thanks.   Diet - low sodium heart healthy   Complete by: As directed    Increase activity slowly   Complete by: As directed       The results of significant diagnostics from this hospitalization (including imaging, microbiology, ancillary and laboratory) are listed below for reference.    Significant Diagnostic Studies: CT ANGIO HEAD W OR WO CONTRAST  Result Date: 12/09/2019 CLINICAL DATA:  Left-sided weakness and speech disturbance over the last several days. MRI shows right  deep brain infarction. History of B-cell lymphoma EXAM: CT ANGIOGRAPHY HEAD AND NECK TECHNIQUE: Multidetector CT imaging of the head and neck was performed using the standard protocol during bolus administration of intravenous contrast. Multiplanar CT image reconstructions and MIPs were obtained to evaluate the vascular anatomy. Carotid stenosis measurements (when applicable) are obtained utilizing NASCET criteria, using the distal internal carotid diameter as the denominator. CONTRAST:  74mL OMNIPAQUE IOHEXOL 350 MG/ML SOLN COMPARISON:  MRI 12/09/2019. CT 09/13/2019 FINDINGS: CT HEAD Brain: Acute infarction evident in the right basal ganglia and adjacent white matter tracks with mild swelling but no hemorrhage or significant mass effect. Chronic  small-vessel ischemic changes of the pons. Old right thalamic lacunar infarction. Small-vessel ischemic changes of the white matter. Vascular: No abnormal vascular finding. Skull: Negative Sinuses: Clear Orbits: Old surgery left lateral orbital rim. No internal orbital pathology. CTA NECK FINDINGS Aortic arch: Aortic atherosclerosis. No aneurysm or dissection. Branching pattern is normal without origin stenosis. Right carotid system: Common carotid artery widely patent to the bifurcation. Minimal plaque at the bifurcation but no stenosis. Cervical ICA widely patent. Left carotid system: Common carotid artery widely patent to the bifurcation. Minimal plaque at the bifurcation but no stenosis. Cervical ICA widely patent to the skull base. Vertebral arteries: No vertebral artery origin stenosis. Both vertebral arteries appear normal through the cervical region to the foramen magnum. Skeleton: Ordinary cervical spondylosis. Other neck: No upper cervical mass or lymphadenopathy. Bilateral supraclavicular enlarged nodes. Upper chest: Prominent paratracheal lymph nodes unchanged since the previous study. Nodes are consistent with the patient's history of B-cell lymphoma. Review of the MIP images confirms the above findings CTA HEAD FINDINGS Anterior circulation: Both internal carotid arteries patent through the skull base and siphon regions. The anterior and middle cerebral vessels are patent without large or medium vessel occlusion or proximal stenosis. No aneurysm or vascular malformation. Posterior circulation: Both vertebral arteries patent to the basilar. No basilar stenosis. Posterior circulation branch vessels appear normal. Venous sinuses: Patent normal. Anatomic variants: None significant. Review of the MIP images confirms the above findings IMPRESSION: 1. Acute infarction in the right basal ganglia and adjacent white matter tracks with mild swelling but no hemorrhage or significant mass effect. 2. No intracranial  large or medium vessel occlusion or correctable proximal stenosis. 3. Minimal atherosclerotic change at both carotid bifurcations but no stenosis. 4. Bilateral supraclavicular and paratracheal adenopathy as seen on the previous study consistent with the patient's history of B-cell lymphoma. Aortic Atherosclerosis (ICD10-I70.0). Electronically Signed   By: Nelson Chimes M.D.   On: 12/09/2019 19:47   CT ANGIO NECK W OR WO CONTRAST  Result Date: 12/09/2019 CLINICAL DATA:  Left-sided weakness and speech disturbance over the last several days. MRI shows right deep brain infarction. History of B-cell lymphoma EXAM: CT ANGIOGRAPHY HEAD AND NECK TECHNIQUE: Multidetector CT imaging of the head and neck was performed using the standard protocol during bolus administration of intravenous contrast. Multiplanar CT image reconstructions and MIPs were obtained to evaluate the vascular anatomy. Carotid stenosis measurements (when applicable) are obtained utilizing NASCET criteria, using the distal internal carotid diameter as the denominator. CONTRAST:  63mL OMNIPAQUE IOHEXOL 350 MG/ML SOLN COMPARISON:  MRI 12/09/2019. CT 09/13/2019 FINDINGS: CT HEAD Brain: Acute infarction evident in the right basal ganglia and adjacent white matter tracks with mild swelling but no hemorrhage or significant mass effect. Chronic small-vessel ischemic changes of the pons. Old right thalamic lacunar infarction. Small-vessel ischemic changes of the white matter. Vascular: No abnormal vascular finding.  Skull: Negative Sinuses: Clear Orbits: Old surgery left lateral orbital rim. No internal orbital pathology. CTA NECK FINDINGS Aortic arch: Aortic atherosclerosis. No aneurysm or dissection. Branching pattern is normal without origin stenosis. Right carotid system: Common carotid artery widely patent to the bifurcation. Minimal plaque at the bifurcation but no stenosis. Cervical ICA widely patent. Left carotid system: Common carotid artery widely  patent to the bifurcation. Minimal plaque at the bifurcation but no stenosis. Cervical ICA widely patent to the skull base. Vertebral arteries: No vertebral artery origin stenosis. Both vertebral arteries appear normal through the cervical region to the foramen magnum. Skeleton: Ordinary cervical spondylosis. Other neck: No upper cervical mass or lymphadenopathy. Bilateral supraclavicular enlarged nodes. Upper chest: Prominent paratracheal lymph nodes unchanged since the previous study. Nodes are consistent with the patient's history of B-cell lymphoma. Review of the MIP images confirms the above findings CTA HEAD FINDINGS Anterior circulation: Both internal carotid arteries patent through the skull base and siphon regions. The anterior and middle cerebral vessels are patent without large or medium vessel occlusion or proximal stenosis. No aneurysm or vascular malformation. Posterior circulation: Both vertebral arteries patent to the basilar. No basilar stenosis. Posterior circulation branch vessels appear normal. Venous sinuses: Patent normal. Anatomic variants: None significant. Review of the MIP images confirms the above findings IMPRESSION: 1. Acute infarction in the right basal ganglia and adjacent white matter tracks with mild swelling but no hemorrhage or significant mass effect. 2. No intracranial large or medium vessel occlusion or correctable proximal stenosis. 3. Minimal atherosclerotic change at both carotid bifurcations but no stenosis. 4. Bilateral supraclavicular and paratracheal adenopathy as seen on the previous study consistent with the patient's history of B-cell lymphoma. Aortic Atherosclerosis (ICD10-I70.0). Electronically Signed   By: Nelson Chimes M.D.   On: 12/09/2019 19:47   MR BRAIN WO CONTRAST  Result Date: 12/10/2019 CLINICAL DATA:  Stroke follow-up EXAM: MRI HEAD WITHOUT CONTRAST TECHNIQUE: Multiplanar, multiecho pulse sequences of the brain and surrounding structures were obtained  without intravenous contrast. COMPARISON:  Brain MRI 12/09/2019 FINDINGS: Brain: Unchanged area of abnormal diffusion restriction in the right basal ganglia. No new site of ischemia. Multifocal white matter hyperintensity, most commonly due to chronic ischemic microangiopathy. Normal volume of CSF spaces. No chronic microhemorrhage. Normal midline structures. Vascular: Unremarkable Skull and upper cervical spine: Normal marrow signal. Sinuses/Orbits: Negative. Other: None. IMPRESSION: Unchanged area of abnormal diffusion restriction in the right basal ganglia, consistent with subacute ischemia. No new site of ischemia. No hemorrhage or mass effect. Electronically Signed   By: Ulyses Jarred M.D.   On: 12/10/2019 19:24   MR BRAIN WO CONTRAST  Result Date: 12/09/2019 CLINICAL DATA:  Focal neuro deficit, greater than 6 hours, stroke suspected. Additional history provided: Increased weakness to left side and difficulty speaking for 3 days, unable to walk for the past 3 days, history of prior CVA with residual left-sided deficits. EXAM: MRI HEAD WITHOUT CONTRAST TECHNIQUE: Multiplanar, multiecho pulse sequences of the brain and surrounding structures were obtained without intravenous contrast. COMPARISON:  Non-contrast head CT 09/13/2019, brain MRI 08/30/2019 FINDINGS: Brain: The exam is intermittently motion degraded. Most notably, there is moderate motion degradation of the sagittal T1 weighted sequence. There is a focus of restricted diffusion within the right basal ganglia which involves the right caudate and lentiform nuclei as well as internal capsule, measuring 2.6 x 2.0 x 2.9 cm (series 3, images 20 3-33). Findings are consistent with acute infarction. Corresponding T2/FLAIR hyperintensity at this site. No evidence of acute  infarct elsewhere within the brain. Redemonstrated chronic lacunar infarct within the paramedian right pontomedullary junction (series 6, image 7). Small chronic lacunar infarct within the  left basal ganglia involving the left caudate and lentiform nuclei. Chronic lacunar infarct within the right thalamus. Additional chronic lacunar infarcts within the right cerebellar white matter. These infarcts were not present on prior MRI 08/30/2019. There is no evidence of intracranial mass. No midline shift or extra-axial fluid collection. No chronic intracranial blood products. Cerebral volume is normal. Vascular: Flow voids maintained within the proximal large arterial vessels. Skull and upper cervical spine: No focal marrow lesion. Incompletely assessed upper cervical spondylosis. Sinuses/Orbits: Susceptibility artifact arising from surgical fixation hardware along the lateral left orbit. No acute orbital abnormality is demonstrated. Mild paranasal sinus mucosal thickening. Trace fluid within inferior left mastoid air cells. IMPRESSION: 2.9 cm acute infarct within the right basal ganglia involving the right caudate and lentiform nuclei, as well as internal capsule. Chronic lacunar infarcts within the left basal ganglia, right thalamus and right cerebellar white matter. These infarcts were not present on prior MRI 09/13/2019. Redemonstrated chronic lacunar infarct within the paramedian right pontomedullary junction. Electronically Signed   By: Kellie Simmering DO   On: 12/09/2019 15:09   DG Chest Portable 1 View  Result Date: 12/09/2019 CLINICAL DATA:  Left-sided weakness and dysarthria for the past 3 days. EXAM: PORTABLE CHEST 1 VIEW COMPARISON:  09/25/2019 FINDINGS: The cardiac silhouette remains borderline enlarged. Clear lungs with normal vascularity. Thoracic spine degenerative changes. IMPRESSION: No acute abnormality. Electronically Signed   By: Claudie Revering M.D.   On: 12/09/2019 14:24    Microbiology: Recent Results (from the past 240 hour(s))  SARS CORONAVIRUS 2 (TAT 6-24 HRS) Nasopharyngeal Nasopharyngeal Swab     Status: None   Collection Time: 12/09/19  4:05 PM   Specimen: Nasopharyngeal  Swab  Result Value Ref Range Status   SARS Coronavirus 2 NEGATIVE NEGATIVE Final    Comment: (NOTE) SARS-CoV-2 target nucleic acids are NOT DETECTED. The SARS-CoV-2 RNA is generally detectable in upper and lower respiratory specimens during the acute phase of infection. Negative results do not preclude SARS-CoV-2 infection, do not rule out co-infections with other pathogens, and should not be used as the sole basis for treatment or other patient management decisions. Negative results must be combined with clinical observations, patient history, and epidemiological information. The expected result is Negative. Fact Sheet for Patients: SugarRoll.be Fact Sheet for Healthcare Providers: https://www.woods-mathews.com/ This test is not yet approved or cleared by the [place] FDA and  has been authorized for detection and/or diagnosis of SARS-CoV-2 by FDA under an Emergency Use Authorization (EUA). This EUA will remain  in effect (meaning this test can be used) for the duration of the COVID-19 declaration under Section 56 4(b)(1) of the Act, 21 U.S.C. section 360bbb-3(b)(1), unless the authorization is terminated or revoked sooner. Performed at [facility], [place] [address] [place], [place] [address]      Labs: CBC: Recent Labs  Lab 12/09/19 1607 12/10/19 0448 12/12/19 0517  WBC 6.0 5.2 5.6  NEUTROABS 4.8 3.9 4.3  HGB 10.9* 11.3* 11.2*  HCT 32.2* 32.8* 32.9*  MCV 74.9* 74.0* 74.9*  PLT 320 335 [id number]   Basic Metabolic Panel: Recent Labs  Lab 12/09/19 1607 12/09/19 1905 12/10/19 0448 12/12/19 0517  NA 138  --  136 134*  K 3.2*  --  3.3* 3.7  CL 104  --  104 104  CO2 25  --  20*  20*  GLUCOSE 104*  --  97 112*  BUN 6*  --  6* 9  CREATININE 0.80  --  0.79 0.76  CALCIUM 9.6  --  9.6 9.6  MG  --  1.8  --   --    Liver Function Tests: Recent Labs  Lab 12/09/19 1607 12/12/19 0517  AST 9* 8*  ALT 9 8  ALKPHOS 67 66    BILITOT 1.8* 2.0*  PROT 6.8 6.9  ALBUMIN 3.7 3.7   No results for input(s): LIPASE, AMYLASE in the last 168 hours. No results for input(s): AMMONIA in the last 168 hours. Cardiac Enzymes: No results for input(s): CKTOTAL, CKMB, CKMBINDEX, TROPONINI in the last 168 hours. BNP (last 3 results) No results for input(s): BNP in the last 8760 hours. CBG: No results for input(s): GLUCAP in the last 168 hours.  Time spent: 35 minutes  Signed:  Berle Mull  [facility]  12/12/2019 3:55 PM

## 2019-12-12 NOTE — Progress Notes (Signed)
This RN has attempted to call [facility] x3 to give report for patient. No one has answered those phone calls

## 2019-12-20 ENCOUNTER — Telehealth: Payer: Self-pay | Admitting: Hematology

## 2019-12-20 NOTE — Telephone Encounter (Signed)
Rescheduled pt appt per MD template.  Was not able to reach pt.  Printed and mailed new appt calendar

## 2020-01-06 ENCOUNTER — Other Ambulatory Visit: Payer: Medicare Other

## 2020-01-06 ENCOUNTER — Ambulatory Visit: Payer: Medicare Other | Admitting: Hematology

## 2020-01-09 ENCOUNTER — Inpatient Hospital Stay: Payer: Medicare Other | Attending: Hematology

## 2020-01-09 ENCOUNTER — Telehealth: Payer: Self-pay | Admitting: *Deleted

## 2020-01-09 ENCOUNTER — Telehealth: Payer: Self-pay | Admitting: Hematology

## 2020-01-09 ENCOUNTER — Inpatient Hospital Stay: Payer: Medicare Other | Admitting: Hematology

## 2020-01-09 NOTE — Telephone Encounter (Signed)
Patient did not come or call - missed appt with Dr.Kale. Schedule message sent to contact patient to reschedule lab and MD appt

## 2020-01-09 NOTE — Telephone Encounter (Signed)
Called pt per 5/20 sch message - unable to reach pt .left message for patient to call back to reschedule appt.

## 2020-01-14 ENCOUNTER — Ambulatory Visit: Payer: Medicare Other | Admitting: Gastroenterology

## 2020-02-04 ENCOUNTER — Encounter: Payer: Self-pay | Admitting: Neurology

## 2020-02-04 ENCOUNTER — Telehealth: Payer: Self-pay

## 2020-02-04 ENCOUNTER — Inpatient Hospital Stay: Payer: Medicare Other | Admitting: Neurology

## 2020-02-04 NOTE — Telephone Encounter (Signed)
PT no show for appointment today.

## 2020-02-08 ENCOUNTER — Emergency Department (HOSPITAL_COMMUNITY)
Admission: EM | Admit: 2020-02-08 | Discharge: 2020-02-08 | Disposition: A | Discharge status: Home / Self Care | Payer: Medicare Other | Attending: Emergency Medicine | Admitting: Emergency Medicine

## 2020-02-08 ENCOUNTER — Emergency Department (HOSPITAL_COMMUNITY): Payer: Medicare Other

## 2020-02-08 DIAGNOSIS — R0789 Other chest pain: Secondary | ICD-10-CM

## 2020-02-08 DIAGNOSIS — R079 Chest pain, unspecified: Secondary | ICD-10-CM | POA: Diagnosis present

## 2020-02-08 DIAGNOSIS — R0602 Shortness of breath: Secondary | ICD-10-CM | POA: Diagnosis not present

## 2020-02-08 DIAGNOSIS — C9110 Chronic lymphocytic leukemia of B-cell type not having achieved remission: Secondary | ICD-10-CM | POA: Diagnosis not present

## 2020-02-08 DIAGNOSIS — E119 Type 2 diabetes mellitus without complications: Secondary | ICD-10-CM | POA: Diagnosis not present

## 2020-02-08 DIAGNOSIS — I10 Essential (primary) hypertension: Secondary | ICD-10-CM | POA: Insufficient documentation

## 2020-02-08 DIAGNOSIS — Z8673 Personal history of transient ischemic attack (TIA), and cerebral infarction without residual deficits: Secondary | ICD-10-CM | POA: Insufficient documentation

## 2020-02-08 DIAGNOSIS — Z87891 Personal history of nicotine dependence: Secondary | ICD-10-CM | POA: Insufficient documentation

## 2020-02-08 DIAGNOSIS — Z7902 Long term (current) use of antithrombotics/antiplatelets: Secondary | ICD-10-CM | POA: Insufficient documentation

## 2020-02-08 DIAGNOSIS — E876 Hypokalemia: Secondary | ICD-10-CM

## 2020-02-08 LAB — CBC
HCT: 32.8 % — ABNORMAL LOW (ref 39.0–52.0)
Hemoglobin: 10.7 g/dL — ABNORMAL LOW (ref 13.0–17.0)
MCH: 25.2 pg — ABNORMAL LOW (ref 26.0–34.0)
MCHC: 32.6 g/dL (ref 30.0–36.0)
MCV: 77.4 fL — ABNORMAL LOW (ref 80.0–100.0)
Platelets: 310 10*3/uL (ref 150–400)
RBC: 4.24 MIL/uL (ref 4.22–5.81)
RDW: 14.9 % (ref 11.5–15.5)
WBC: 4.2 10*3/uL (ref 4.0–10.5)
nRBC: 0 % (ref 0.0–0.2)

## 2020-02-08 LAB — BASIC METABOLIC PANEL
Anion gap: 9 (ref 5–15)
BUN: 5 mg/dL — ABNORMAL LOW (ref 8–23)
CO2: 25 mmol/L (ref 22–32)
Calcium: 9.8 mg/dL (ref 8.9–10.3)
Chloride: 104 mmol/L (ref 98–111)
Creatinine, Ser: 0.77 mg/dL (ref 0.61–1.24)
GFR calc Af Amer: >60 mL/min (ref >60)
GFR calc non Af Amer: >60 mL/min (ref >60)
Glucose, Bld: 103 mg/dL — ABNORMAL HIGH (ref 70–99)
Potassium: 2.9 mmol/L — ABNORMAL LOW (ref 3.5–5.1)
Sodium: 138 mmol/L (ref 135–145)

## 2020-02-08 LAB — TROPONIN I (HIGH SENSITIVITY): Troponin I (High Sensitivity): 4 ng/L (ref <18)

## 2020-02-08 MED ORDER — SODIUM CHLORIDE 0.9% FLUSH
3.0000 mL | Freq: Once | INTRAVENOUS | Status: AC
  Administered 2020-02-08: 3 mL via INTRAVENOUS

## 2020-02-08 MED ORDER — POTASSIUM CHLORIDE CRYS ER 20 MEQ PO TBCR
60.0000 meq | EXTENDED_RELEASE_TABLET | Freq: Once | ORAL | Status: AC
  Administered 2020-02-08: 60 meq via ORAL
  Filled 2020-02-08: qty 3

## 2020-02-08 MED ORDER — POTASSIUM CHLORIDE CRYS ER 20 MEQ PO TBCR
20.0000 meq | EXTENDED_RELEASE_TABLET | Freq: Every day | ORAL | 0 refills | Status: DC
Start: 2020-02-08 — End: 2021-05-06

## 2020-02-08 NOTE — ED Provider Notes (Signed)
[facility] Provider Note   CSN: [id number] Arrival date & time: 02/08/20  0818     History Chief Complaint  Patient presents with  . Chest Pain    [name] [name] is a 64 y.o. male with past medical history of type 2 diabetes, esophageal reflux, high blood pressure, hyperlipidemia, tobacco abuse, previous CVA, CLL who presents emergency department chief complaint of chest pain.  Patient states that he was holding his grandson and put him down and when he reached up his right arm he had sudden severe sharp chest pain on the left side of his chest over his heart.  He states that he was having severe pain, pain with worse with breathing.  He did not try to move his arms.  He states that it lasted approximately 1 hour.  He was given nitroglycerin and aspirin with total relief of his symptoms.  He denies diaphoresis, nausea, vomiting, cough.  Patient states that he has had a heart attack in the past however do not see this in review of EMR.  He denies family history of heart disease.  HPI  HPI: A 64 year old patient with a history of CVA, treated diabetes and hypertension presents for evaluation of chest pain. Initial onset of pain was less than one hour ago. The patient's chest pain is well-localized, is sharp, is not worse with exertion and is relieved by nitroglycerin. The patient's chest pain is middle- or left-sided, is not described as heaviness/pressure/tightness and does not radiate to the arms/jaw/neck. The patient does not complain of nausea and denies diaphoresis. The patient has no history of peripheral artery disease, has not smoked in the past 90 days, has no relevant family history of coronary artery disease (first degree relative at less than age 13), has no history of hypercholesterolemia and does not have an elevated BMI (>=30).   Past Medical History:  Diagnosis Date  . Allergic rhinitis, cause unspecified   . Backache, unspecified   . Body  mass index 33.0-33.9, adult   . Diabetes mellitus without complication ([place])    Type II  . Elevated blood pressure reading without diagnosis of hypertension   . Esophageal reflux   . Generalized pain   . Heartburn   . Insomnia, unspecified   . 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 Other abnormal blood chemistry   . Other and unspecified hyperlipidemia   . Other dyspnea and respiratory abnormality   . Other nonspecific findings on examination of blood(790.99)   . Pain in joint, lower leg   . Pneumonia    1968  . Sleep apnea    Does not use or own a CPAP  . Tobacco use disorder   . Unspecified essential hypertension     Patient Active Problem List   Diagnosis Date Noted  . GERD (gastroesophageal reflux disease) 12/09/2019  . Hypokalemia 12/09/2019  . Abnormality of gait 10/10/2019  . Temperature elevated   . Labile blood pressure   . Diffuse large B-cell lymphoma ([place])   . Sleep disturbance   . History of hypertension   . Opacity of lung on imaging study   . Fever   . Reactive depression   . Benign essential HTN   . Labile blood glucose   . Acute on chronic anemia   . Lymphopenia   . Hypoalbuminemia due to protein-calorie malnutrition ([place])   . Controlled type 2 diabetes mellitus with hyperglycemia, without long-term current use of insulin (  [place])   . CVA (cerebral vascular accident) ([place]) 09/03/2019  . Acute ischemic stroke ([place]) 08/29/2019  . Hyponatremia 08/29/2019  . HTN (hypertension) 08/29/2019  . DM2 (diabetes mellitus, type 2) ([place]) 08/29/2019  . CLL (chronic lymphocytic leukemia) ([place]) 04/10/2019  . Counseling regarding advance care planning and goals of care 04/10/2019  . Deviated septum 05/18/2018  . Iron deficiency anemia 02/26/2018    Past Surgical History:  Procedure Laterality Date  . bil wrist surgery    . BRONCHIAL BRUSHINGS  09/24/2019   Procedure: BRONCHIAL BRUSHINGS;  Surgeon: Candee Furbish, MD;   Location: [facility];  Service: Pulmonary;;  . BRONCHIAL WASHINGS  09/24/2019   Procedure: BRONCHIAL WASHINGS;  Surgeon: Candee Furbish, MD;  Location: [facility];  Service: Pulmonary;;  . ENDOSCOPIC CONCHA BULLOSA RESECTION Bilateral 05/18/2018   Procedure: ENDOSCOPIC CONCHA BULLOSA RESECTION;  Surgeon: Jerrell Belfast, MD;  Location: [facility];  Service: ENT;  Laterality: Bilateral;  . NASAL SEPTOPLASTY W/ TURBINOPLASTY Bilateral 05/18/2018   Procedure: NASAL SEPTOPLASTY WITH TURBINATE REDUCTION;  Surgeon: Jerrell Belfast, MD;  Location: [place];  Service: ENT;  Laterality: Bilateral;  . ORIF FOREARM FRACTURE     right  . plastic surgery to face    . SINUS ENDO W/FUSION Bilateral 05/18/2018   Procedure: ENDOSCOPIC SINUS SURGERY WITH NAVIGATION;  Surgeon: Jerrell Belfast, MD;  Location: [place];  Service: ENT;  Laterality: Bilateral;  . VIDEO BRONCHOSCOPY N/A 09/24/2019   Procedure: VIDEO BRONCHOSCOPY WITHOUT FLUORO;  Surgeon: Candee Furbish, MD;  Location: [facility] ENDOSCOPY;  Service: Pulmonary;  Laterality: N/A;       Family History  Problem Relation Age of Onset  . Diabetes Mother   . Hypertension Mother   . Cancer Paternal Uncle     Social History   Tobacco Use  . Smoking status: Former Smoker    Packs/day: 0.20    Years: 20.00    Pack years: 4.00    Types: Cigars    Quit date: 2014    Years since quitting: 7.4  . Smokeless tobacco: Never Used  Vaping Use  . Vaping Use: Never used  Substance Use Topics  . Alcohol use: Not Currently    Comment: 2 40oz beers a week  . Drug use: No    Home Medications Prior to Admission medications   Medication Sig Start Date End Date Taking? Authorizing Provider  atorvastatin (LIPITOR) 20 MG tablet Take 1 tablet (20 mg total) by mouth daily. 09/27/19  Yes Angiulli, Lavon Paganini, PA-C  clopidogrel (PLAVIX) 75 MG tablet Take 1 tablet (75 mg total) by mouth daily. 09/27/19  Yes Angiulli, Lavon Paganini, PA-C  cyclobenzaprine (FLEXERIL) 10 MG tablet Take 10 mg  by mouth at bedtime. 01/13/20  Yes [provider]  methylphenidate (RITALIN) 5 MG tablet Take 1 tablet (5 mg total) by mouth daily. 09/27/19  Yes Angiulli, Lavon Paganini, PA-C  oxyCODONE-acetaminophen (PERCOCET/ROXICET) 5-325 MG tablet Take 2 tablets by mouth every 8 (eight) hours as needed (for pain). 12/12/19  Yes Lavina Hamman, MD  pantoprazole (PROTONIX) 40 MG tablet Take 1 tablet (40 mg total) by mouth at bedtime. 09/27/19  Yes Angiulli, Lavon Paganini, PA-C    Allergies    Lisinopril and Tuberculin tests  Review of Systems   Review of Systems Ten systems reviewed and are negative for acute change, except as noted in the HPI.   Physical Exam Updated Vital Signs BP (!) 154/89   Pulse (!) 56   Temp 98.6 F (37 C)  Resp 10   SpO2 97%   Physical Exam Vitals and nursing note reviewed.  Constitutional:      General: He is not in acute distress.    Appearance: He is well-developed. He is not diaphoretic.  HENT:     Head: Normocephalic and atraumatic.  Eyes:     General: No scleral icterus.    Conjunctiva/sclera: Conjunctivae normal.  Cardiovascular:     Rate and Rhythm: Normal rate and regular rhythm.     Heart sounds: Normal heart sounds.  Pulmonary:     Effort: Pulmonary effort is normal. No respiratory distress.     Breath sounds: Normal breath sounds.  Chest:     Chest wall: Tenderness present.    Abdominal:     Palpations: Abdomen is soft.     Tenderness: There is no abdominal tenderness.  Musculoskeletal:     Cervical back: Normal range of motion and neck supple.  Skin:    General: Skin is warm and dry.  Neurological:     Mental Status: He is alert.  Psychiatric:        Behavior: Behavior normal.     ED Results / Procedures / Treatments   Labs (all labs ordered are listed, but only abnormal results are displayed) Labs Reviewed  BASIC METABOLIC PANEL - Abnormal; Notable for the following components:      Result Value   Potassium 2.9 (*)    Glucose, Bld  103 (*)    BUN 5 (*)    All other components within normal limits  CBC - Abnormal; Notable for the following components:   Hemoglobin 10.7 (*)    HCT 32.8 (*)    MCV 77.4 (*)    MCH 25.2 (*)    All other components within normal limits  TROPONIN I (HIGH SENSITIVITY)  TROPONIN I (HIGH SENSITIVITY)    EKG EKG Interpretation  Date/Time:  Saturday February 08 2020 08:22:37 EDT Ventricular Rate:  69 PR Interval:    QRS Duration: 95 QT Interval:  406 QTC Calculation: 435 R Axis:   1 Text Interpretation: Sinus rhythm Atrial premature complexes Abnormal R-wave progression, early transition Nonspecific T abnormalities, lateral leads biphasic t waves in v3 and v4 seen on prior Otherwise no significant change Confirmed by Deno Etienne [phone]) on 02/08/2020 8:38:27 AM   Radiology DG Chest 2 View  Result Date: 02/08/2020 CLINICAL DATA:  Chest pain with some shortness of breath, nausea and vomiting. EXAM: CHEST - 2 VIEW COMPARISON:  12/09/2019 FINDINGS: Lungs are adequately inflated without airspace opacification or effusion. Cardiomediastinal silhouette and remainder of the exam is unchanged. IMPRESSION: No active cardiopulmonary disease. Electronically Signed   By: Marin Olp M.D.   On: 02/08/2020 09:37    Procedures Procedures (including critical care time)  Medications Ordered in ED Medications  sodium chloride flush (NS) 0.9 % injection 3 mL (3 mLs Intravenous Given 02/08/20 1114)  potassium chloride SA (KLOR-CON) CR tablet 60 mEq (60 mEq Oral Given 02/08/20 1339)    ED Course  I have reviewed the triage vital signs and the nursing notes.  Pertinent labs & imaging results that were available during my care of the patient were reviewed by me and considered in my medical decision making (see chart for details).    MDM Rules/Calculators/A&P HEAR Score: 3                        AO:ZHYQM pain VS: BP (!) 154/89   Pulse (!) 56  Temp 98.6 F (37 C)   Resp 10   SpO2 97%  WG:NFAOZHY  is gathered by patient and emr. Previous records obtained and reviewed. DDX:The patient's complaint of chest pain. involves an extensive number of diagnostic and treatment options, and is a complaint that carries with it a high risk of complications, morbidity, and potential mortality. Given the large differential diagnosis, medical decision making is of high complexity. The emergent differential diagnosis of chest pain includes: Acute coronary syndrome, pericarditis, aortic dissection, pulmonary embolism, tension pneumothorax, pneumonia, and esophageal rupture.   Labs: I ordered reviewed and interpreted labs which include CBC which mild chronic microcytic anemia, white blood cell count within normal limits, troponin is within normal limits, BMP shows potassium of 2.9 repleted orally Imaging: I ordered and reviewed images which included 2 view chest x-ray. I independently visualized and interpreted all imaging.There are no acute, significant findings on today's images. EKG: Sinus rhythm at a rate of 69 with PACs Consults: N/A MDM: Patient here with chest pain.  This appears to be consistent with chest wall pain.  Heart score of 3.  Patient was a very difficult blood stick.  He initially had refused blood work-up in the beginning.  During second repeat troponin he got stuck 3 times without successful draw.  Patient declined any further blood sticks.  I think that his pain is due to chest wall pain.  Patient is comfortable being discharged at this time.  Case discussed with Dr. Tyrone Nine who agrees with plan for discharge.  Patient will be discharged with Patient disposition: Discharge The patient appears reasonably screened and/or stabilized for discharge and I doubt any other medical condition or other [facility] requiring further screening, evaluation, or treatment in the ED at this time prior to discharge. I have discussed lab and/or imaging findings with the patient and answered all questions/concerns to the best  of my ability.I have discussed return precautions and OP follow up.    Final Clinical Impression(s) / ED Diagnoses Final diagnoses:  Chest wall pain    Rx / DC Orders ED Discharge Orders    None       Margarita Mail, PA-C 02/08/20 [place], [place], DO 02/09/20 [phone]

## 2020-02-08 NOTE — Discharge Instructions (Addendum)
You have been diagnosed by your caregiver as having chest wall pain. °SEEK IMMEDIATE MEDICAL ATTENTION IF: °You develop a fever.  °Your chest pains become severe or intolerable.  °You develop new, unexplained symptoms (problems).  °You develop shortness of breath, nausea, vomiting, sweating or feel light headed.  °You develop a new cough or you cough up blood. ° °

## 2020-02-08 NOTE — ED Notes (Signed)
[facility] called to transport patient home 

## 2020-02-08 NOTE — ED Triage Notes (Signed)
Approximately 0700, pt states he lifted R arm and his chest started hurting. Resolved after 324 asprin and 1 nitro given by EMS. Endorses some associated SOB, no nausea, vomitting, dizziness. Pt symptom free at this time. VSS for [facility]

## 2020-02-08 NOTE — ED Notes (Signed)
NT Sharyn Lull attempted blood draw, RN Zelphia Cairo attempted blood draw, unsuccessful at this time. PA Harris made aware.

## 2021-02-27 ENCOUNTER — Other Ambulatory Visit: Payer: Self-pay

## 2021-02-27 ENCOUNTER — Emergency Department (HOSPITAL_COMMUNITY)
Admission: EM | Admit: 2021-02-27 | Discharge: 2021-02-27 | Disposition: A | Discharge status: Home / Self Care | Payer: Medicare Other | Attending: Emergency Medicine | Admitting: Emergency Medicine

## 2021-02-27 DIAGNOSIS — Z79899 Other long term (current) drug therapy: Secondary | ICD-10-CM | POA: Insufficient documentation

## 2021-02-27 DIAGNOSIS — R599 Enlarged lymph nodes, unspecified: Secondary | ICD-10-CM | POA: Diagnosis not present

## 2021-02-27 DIAGNOSIS — Z87891 Personal history of nicotine dependence: Secondary | ICD-10-CM | POA: Diagnosis not present

## 2021-02-27 DIAGNOSIS — Z7902 Long term (current) use of antithrombotics/antiplatelets: Secondary | ICD-10-CM | POA: Insufficient documentation

## 2021-02-27 DIAGNOSIS — E119 Type 2 diabetes mellitus without complications: Secondary | ICD-10-CM | POA: Diagnosis not present

## 2021-02-27 DIAGNOSIS — L02412 Cutaneous abscess of left axilla: Secondary | ICD-10-CM | POA: Diagnosis present

## 2021-02-27 DIAGNOSIS — I10 Essential (primary) hypertension: Secondary | ICD-10-CM | POA: Diagnosis not present

## 2021-02-27 MED ORDER — DOXYCYCLINE HYCLATE 100 MG PO CAPS
100.0000 mg | ORAL_CAPSULE | Freq: Two times a day (BID) | ORAL | 0 refills | Status: DC
Start: 2021-02-27 — End: 2021-05-06

## 2021-02-27 NOTE — Discharge Instructions (Addendum)
Begin taking doxycycline as prescribed.  Apply warm compresses as frequently as possible for the next several days.  Follow-up with your primary doctor if the lymph node in your neck is not decreasing in size in the next 1 to 2 weeks.

## 2021-02-27 NOTE — ED Triage Notes (Signed)
Pt came in with c/o abscess L axilla. Pt also has swollen lymph nodes L neck. Pt states that he noticed blood in his urine starting tonight. No urinary symptoms

## 2021-02-27 NOTE — ED Provider Notes (Signed)
[name] DE[facility] Provider Note   CSN: [id number] Arrival date & time: 02/27/21  0254     History Chief Complaint  Patient presents with   Abscess   Lymphadenopathy    [name] [name] [name] is a 65 y.o. male.  Patient is a 65 year old male with past medical history of [name] [name], [name] [name] [name], [name] [name] [name], [name] [name] [name] of bleeding from his left axilla and swollen lymph node to the lower neck.  Patient was washing himself with a washcloth but he noticed there was blood on the rag.  This came from the left axilla.  He also noticed a swollen lymph node above his clavicle on the left side.  He has history of lymphoma and this presented similarly.  The history is provided by the patient.  Abscess Abscess location: axilla left. Abscess quality: draining and induration   Progression:  Worsening Chronicity:  New Context: not [name] [name] and not immunosuppression   Relieved by:  Nothing Worsened by:  Nothing Ineffective treatments:  None tried     Past Medical History:  Diagnosis Date   Allergic rhinitis, cause unspecified    Backache, unspecified    Body mass index 33.0-33.9, adult    [name] [name] mellitus without complication (HCC)    Type II   Elevated blood pressure reading without diagnosis of hypertension    Esophageal reflux    Generalized pain    Heartburn    Insomnia, unspecified    Nonspecific reaction to tuberculin skin test without active tuberculosis(795.51)    Other abnormal blood chemistry    Other and unspecified hyperlipidemia    Other dyspnea and respiratory abnormality    Other nonspecific findings on examination of blood(790.99)    Pain in joint, lower leg    Pneumonia    1968   Sleep apnea    Does not use or own a CPAP   Tobacco use disorder    Unspecified essential hypertension     Patient Active Problem List   Diagnosis Date Noted   GERD (gastroesophageal reflux disease)  12/09/2019   Hypokalemia 12/09/2019   Abnormality of gait 10/10/2019   Temperature elevated    Labile blood pressure    Diffuse large B-cell lymphoma (HCC)    Sleep disturbance    History of hypertension    Opacity of lung on imaging study    Fever    Reactive depression    Benign essential HTN    Labile blood glucose    Acute on chronic anemia    Lymphopenia    Hypoalbuminemia due to protein-calorie malnutrition ([facility])    Controlled type 2 [name] [name] mellitus with hyperglycemia, without long-term current use of insulin (H[facility])    CVA (cerebral vascular accident) ([place]) 09/03/2019   Acute ischemic stroke ([place]) 08/29/2019   Hyponatremia 08/29/2019   HTN (hypertension) 08/29/2019   DM2 ([name] [name] mellitus, type 2) ([place]) 08/29/2019   [name] [name] [name] (chronic lymphocytic leukemia) ([place]) 04/10/2019   Counseling regarding advance care planning and goals of care 04/10/2019   Deviated septum 05/18/2018   Iron deficiency anemia 02/26/2018    Past Surgical History:  Procedure Laterality Date   bil wrist surgery     BRONCHIAL BRUSHINGS  09/24/2019   Procedure: BRONCHIAL BRUSHINGS;  Surgeon: Candee Furbish, MD;  Location: [place];  Service: Pulmonary;;   BRONCHIAL WASHINGS  09/24/2019   Procedure: BRONCHIAL WASHINGS;  Surgeon: Candee Furbish, MD;  Location: [facility] ENDOSCOPY;  Service: Pulmonary;;   ENDOSCOPIC CONCHA BULLOSA  RESECTION Bilateral 05/18/2018   Procedure: ENDOSCOPIC CONCHA BULLOSA RESECTION;  Surgeon: Jerrell Belfast, MD;  Location: [place];  Service: ENT;  Laterality: Bilateral;   NASAL SEPTOPLASTY W/ TURBINOPLASTY Bilateral 05/18/2018   Procedure: NASAL SEPTOPLASTY WITH TURBINATE REDUCTION;  Surgeon: Jerrell Belfast, MD;  Location: [place];  Service: ENT;  Laterality: Bilateral;   ORIF FOREARM FRACTURE     right   plastic surgery to face     SINUS ENDO W/FUSION Bilateral 05/18/2018   Procedure: ENDOSCOPIC SINUS SURGERY WITH NAVIGATION;  Surgeon: Jerrell Belfast, MD;  Location: [place];  Service:  ENT;  Laterality: Bilateral;   VIDEO BRONCHOSCOPY N/A 09/24/2019   Procedure: VIDEO BRONCHOSCOPY WITHOUT FLUORO;  Surgeon: Candee Furbish, MD;  Location: [place];  Service: Pulmonary;  Laterality: N/A;       Family History  Problem Relation Age of Onset   [name] [name] Mother    Hypertension Mother    Cancer Paternal Uncle     Social History   Tobacco Use   Smoking status: Former    Packs/day: 0.20    Years: 20.00    Pack years: 4.00    Types: Cigars, Cigarettes    Quit date: 2014    Years since quitting: 8.5   Smokeless tobacco: Never  Vaping Use   Vaping Use: Never used  Substance Use Topics   Alcohol use: Not Currently    Comment: 2 40oz beers a week   Drug use: No    Home Medications [name] to Admission medications   Medication Sig Start Date End Date Taking? Authorizing Provider  aspirin EC 81 MG tablet Take 81 mg by mouth daily.   Yes [provider]  cyclobenzaprine (FLEXERIL) 10 MG tablet Take 10 mg by mouth at bedtime. 01/13/20  Yes [provider]  methylphenidate (RITALIN) 5 MG tablet Take 1 tablet (5 mg total) by mouth daily. 09/27/19  Yes Angiulli, Lavon Paganini, PA-C  oxyCODONE-acetaminophen (PERCOCET/ROXICET) 5-325 MG tablet Take 2 tablets by mouth every 8 (eight) hours as needed (for pain). 12/12/19  Yes Lavina Hamman, MD  pantoprazole (PROTONIX) 40 MG tablet Take 1 tablet (40 mg total) by mouth at bedtime. 09/27/19  Yes Angiulli, Lavon Paganini, PA-C  potassium chloride SA (KLOR-CON) 20 MEQ tablet Take 1 tablet (20 mEq total) by mouth daily. 02/08/20  Yes Harris, Abigail, PA-C  atorvastatin (LIPITOR) 20 MG tablet Take 1 tablet (20 mg total) by mouth daily. Patient not taking: Reported on 02/27/2021 09/27/19   Cathlyn Parsons, PA-C  clopidogrel (PLAVIX) 75 MG tablet Take 1 tablet (75 mg total) by mouth daily. 09/27/19   Angiulli, Lavon Paganini, PA-C    Allergies    Lisinopril and Tuberculin tests  Review of Systems   Review of Systems  All other systems  reviewed and are negative.  Physical Exam Updated Vital Signs BP (!) 150/83   Pulse 78   Temp 98 F (36.7 C) (Oral)   Resp 15   Ht 5\' 7"  (1.702 m)   Wt 74.8 kg   SpO2 99%   BMI 25.84 kg/m   Physical Exam Vitals and nursing note reviewed.  Constitutional:      General: He is not in acute distress.    Appearance: He is well-developed. He is not diaphoretic.  HENT:     Head: Normocephalic and atraumatic.  Neck:     Comments: There is a palpable left supraclavicular lymph node noted. Cardiovascular:     Rate and Rhythm: Normal rate and regular rhythm.  Heart sounds: No murmur heard.   No friction rub.  Pulmonary:     Effort: Pulmonary effort is normal. No respiratory distress.     Breath sounds: Normal breath sounds. No wheezing or rales.  Abdominal:     General: Bowel sounds are normal. There is no distension.     Palpations: Abdomen is soft.     Tenderness: There is no abdominal tenderness.  Musculoskeletal:        General: Normal range of motion.     Cervical back: Normal range of motion and neck supple.  Skin:    General: Skin is warm and dry.     Comments: Under the left axilla is a 3 cm round area of erythema.  It is draining purulent material.  Neurological:     Mental Status: He is alert and oriented to person, place, and time.     Coordination: Coordination normal.    ED Results / Procedures / Treatments   Labs (all labs ordered are listed, but only abnormal results are displayed) Labs Reviewed - No data to display  EKG None  Radiology No results found.  Procedures Procedures   Medications Ordered in ED Medications - No data to display  ED Course  I have reviewed the triage vital signs and the nursing notes.  Pertinent labs & imaging results that were available during my care of the patient were reviewed by me and considered in my medical decision making (see chart for details).    MDM Rules/Calculators/A&P  Patient presenting with an  abscess to the left axilla.  This was draining [name] to my [name] [name].  This will be treated with antibiotics and warm compresses.  He also has an enlarged supraclavicular lymph node on the same side.  Whether this represents a reactive lymph node from the infection in his axilla or is related to his history of lymphoma I am uncertain.  Patient will be treated with warm compresses, antibiotics, and is to follow-up with primary doctor if not improving in the next week.  Final Clinical Impression(s) / ED Diagnoses Final diagnoses:  None    Rx / DC Orders ED Discharge Orders     None        Veryl Speak, MD 02/27/21 [id number]

## 2021-04-27 ENCOUNTER — Emergency Department (HOSPITAL_COMMUNITY)
Admission: EM | Admit: 2021-04-27 | Discharge: 2021-04-27 | Disposition: A | Discharge status: Home / Self Care | Payer: Medicare Other | Attending: Emergency Medicine | Admitting: Emergency Medicine

## 2021-04-27 ENCOUNTER — Encounter (HOSPITAL_COMMUNITY): Payer: Self-pay | Admitting: *Deleted

## 2021-04-27 DIAGNOSIS — M79652 Pain in left thigh: Secondary | ICD-10-CM | POA: Diagnosis not present

## 2021-04-27 DIAGNOSIS — I10 Essential (primary) hypertension: Secondary | ICD-10-CM | POA: Insufficient documentation

## 2021-04-27 DIAGNOSIS — Z7902 Long term (current) use of antithrombotics/antiplatelets: Secondary | ICD-10-CM | POA: Diagnosis not present

## 2021-04-27 DIAGNOSIS — Z87891 Personal history of nicotine dependence: Secondary | ICD-10-CM | POA: Insufficient documentation

## 2021-04-27 DIAGNOSIS — E119 Type 2 diabetes mellitus without complications: Secondary | ICD-10-CM | POA: Diagnosis not present

## 2021-04-27 DIAGNOSIS — Z7982 Long term (current) use of aspirin: Secondary | ICD-10-CM | POA: Diagnosis not present

## 2021-04-27 DIAGNOSIS — B0230 Zoster ocular disease, unspecified: Secondary | ICD-10-CM | POA: Insufficient documentation

## 2021-04-27 DIAGNOSIS — H5712 Ocular pain, left eye: Secondary | ICD-10-CM | POA: Diagnosis present

## 2021-04-27 MED ORDER — FLUORESCEIN SODIUM 1 MG OP STRP
1.0000 | ORAL_STRIP | Freq: Once | OPHTHALMIC | Status: AC
  Administered 2021-04-27: 1 via OPHTHALMIC
  Filled 2021-04-27: qty 1

## 2021-04-27 MED ORDER — VALACYCLOVIR HCL 1 G PO TABS: 1000.0000 mg | ORAL_TABLET | Freq: Three times a day (TID) | ORAL | 0 refills | Status: AC

## 2021-04-27 MED ORDER — TETRACAINE HCL 0.5 % OP SOLN
2.0000 [drp] | Freq: Once | OPHTHALMIC | Status: AC
  Administered 2021-04-27: 2 [drp] via OPHTHALMIC
  Filled 2021-04-27: qty 4

## 2021-04-27 MED ORDER — VALACYCLOVIR HCL 500 MG PO TABS
1000.0000 mg | ORAL_TABLET | Freq: Once | ORAL | Status: AC
  Administered 2021-04-27: 1000 mg via ORAL
  Filled 2021-04-27: qty 2

## 2021-04-27 NOTE — ED Provider Notes (Signed)
, [facility] DEPT Provider Note   CSN: [id number] Arrival date & time: 04/27/21  1303     History Chief Complaint  Patient presents with   Eye Pain    [name] [name] is a 65 y.o. male with a past medical history as noted below who presents to the ED due to left thigh pain associated with edema and photophobia.  Patient states he was poked in the eye on Sunday and woke up this morning with edema, drainage, and pain. Denies visual changes. No fever or chills. He also endorses an area above his eye which he is unsure what it is from. He has not received his shingles vaccine. He has not tried anything for his symptoms.   History obtained from patient and past medical records. No interpreter used during encounter.       Past Medical History:  Diagnosis Date   Allergic rhinitis, cause unspecified    Backache, unspecified    Body mass index 33.0-33.9, adult    Diabetes mellitus without complication (HCC)    Type II   Elevated blood pressure reading without diagnosis of hypertension    Esophageal reflux    Generalized pain    Heartburn    Insomnia, unspecified    Nonspecific reaction to tuberculin skin test without active tuberculosis(795.51)    Other abnormal blood chemistry    Other and unspecified hyperlipidemia    Other dyspnea and respiratory abnormality    Other nonspecific findings on examination of blood(790.99)    Pain in joint, lower leg    Pneumonia    1968   Sleep apnea    Does not use or own a CPAP   Tobacco use disorder    Unspecified essential hypertension     Patient Active Problem List   Diagnosis Date Noted   GERD (gastroesophageal reflux disease) 12/09/2019   Hypokalemia 12/09/2019   Abnormality of gait 10/10/2019   Temperature elevated    Labile blood pressure    Diffuse large B-cell lymphoma (HCC)    Sleep disturbance    History of hypertension    Opacity of lung on imaging study    Fever    Reactive depression     Benign essential HTN    Labile blood glucose    Acute on chronic anemia    Lymphopenia    Hypoalbuminemia due to protein-calorie malnutrition (HCC)    Controlled type 2 diabetes mellitus with hyperglycemia, without long-term current use of insulin (HCC)    CVA (cerebral vascular accident) ([place]) 09/03/2019   Acute ischemic stroke ([place]) 08/29/2019   Hyponatremia 08/29/2019   HTN (hypertension) 08/29/2019   DM2 (diabetes mellitus, type 2) ([place]) 08/29/2019   CLL (chronic lymphocytic leukemia) ([place]) 04/10/2019   Counseling regarding advance care planning and goals of care 04/10/2019   Deviated septum 05/18/2018   Iron deficiency anemia 02/26/2018    Past Surgical History:  Procedure Laterality Date   bil wrist surgery     BRONCHIAL BRUSHINGS  09/24/2019   Procedure: BRONCHIAL BRUSHINGS;  Surgeon: Candee Furbish, MD;  Location: [facility] ENDOSCOPY;  Service: Pulmonary;;   BRONCHIAL WASHINGS  09/24/2019   Procedure: BRONCHIAL WASHINGS;  Surgeon: Candee Furbish, MD;  Location: [facility] ENDOSCOPY;  Service: Pulmonary;;   ENDOSCOPIC CONCHA BULLOSA RESECTION Bilateral 05/18/2018   Procedure: ENDOSCOPIC CONCHA BULLOSA RESECTION;  Surgeon: Jerrell Belfast, MD;  Location: [facility];  Service: ENT;  Laterality: Bilateral;   NASAL SEPTOPLASTY W/ TURBINOPLASTY Bilateral 05/18/2018   Procedure: NASAL SEPTOPLASTY  WITH TURBINATE REDUCTION;  Surgeon: Jerrell Belfast, MD;  Location: [facility];  Service: ENT;  Laterality: Bilateral;   ORIF FOREARM FRACTURE     right   plastic surgery to face     SINUS ENDO W/FUSION Bilateral 05/18/2018   Procedure: ENDOSCOPIC SINUS SURGERY WITH NAVIGATION;  Surgeon: Jerrell Belfast, MD;  Location: [facility];  Service: ENT;  Laterality: Bilateral;   VIDEO BRONCHOSCOPY N/A 09/24/2019   Procedure: VIDEO BRONCHOSCOPY WITHOUT FLUORO;  Surgeon: Candee Furbish, MD;  Location: [facility] ENDOSCOPY;  Service: Pulmonary;  Laterality: N/A;       Family History  Problem Relation Age of Onset   Diabetes Mother     Hypertension Mother    Cancer Paternal Uncle     Social History   Tobacco Use   Smoking status: Former    Packs/day: 0.20    Years: 20.00    Pack years: 4.00    Types: Cigars, Cigarettes    Quit date: 2014    Years since quitting: 8.6   Smokeless tobacco: Never  Vaping Use   Vaping Use: Never used  Substance Use Topics   Alcohol use: Not Currently    Comment: 2 40oz beers a week   Drug use: No    Home Medications Prior to Admission medications   Medication Sig Start Date End Date Taking? Authorizing Provider  valACYclovir (VALTREX) 1000 MG tablet Take 1 tablet (1,000 mg total) by mouth 3 (three) times daily for 10 days. 04/27/21 05/07/21 Yes Tomaz Janis, Druscilla Brownie, PA-C  aspirin EC 81 MG tablet Take 81 mg by mouth daily.    [provider]  atorvastatin (LIPITOR) 20 MG tablet Take 1 tablet (20 mg total) by mouth daily. Patient not taking: Reported on 02/27/2021 09/27/19   Cathlyn Parsons, PA-C  clopidogrel (PLAVIX) 75 MG tablet Take 1 tablet (75 mg total) by mouth daily. 09/27/19   Angiulli, Lavon Paganini, PA-C  cyclobenzaprine (FLEXERIL) 10 MG tablet Take 10 mg by mouth at bedtime. 01/13/20   [provider]  doxycycline (VIBRAMYCIN) 100 MG capsule Take 1 capsule (100 mg total) by mouth 2 (two) times daily. One po bid x 7 days 02/27/21   Veryl Speak, MD  methylphenidate (RITALIN) 5 MG tablet Take 1 tablet (5 mg total) by mouth daily. 09/27/19   Angiulli, Lavon Paganini, PA-C  oxyCODONE-acetaminophen (PERCOCET/ROXICET) 5-325 MG tablet Take 2 tablets by mouth every 8 (eight) hours as needed (for pain). 12/12/19   Lavina Hamman, MD  pantoprazole (PROTONIX) 40 MG tablet Take 1 tablet (40 mg total) by mouth at bedtime. 09/27/19   Angiulli, Lavon Paganini, PA-C  potassium chloride SA (KLOR-CON) 20 MEQ tablet Take 1 tablet (20 mEq total) by mouth daily. 02/08/20   Margarita Mail, PA-C    Allergies    Lisinopril and Tuberculin tests  Review of Systems   Review of Systems  Constitutional:   Negative for chills and fever.  Eyes:  Positive for photophobia, pain, discharge and redness. Negative for visual disturbance.  All other systems reviewed and are negative.  Physical Exam Updated Vital Signs BP (!) 156/74 (BP Location: Left Arm)   Pulse 87   Temp 98.5 F (36.9 C) (Oral)   Resp 18   SpO2 94%   Physical Exam Vitals and nursing note reviewed.  Constitutional:      General: He is not in acute distress.    Appearance: He is not ill-appearing.  HENT:     Head: Normocephalic.  Eyes:  Pupils: Pupils are equal, round, and reactive to light.     Comments: Injected conjuctiva. Periorbital edema. Patient has a lot of difficulty keeping eye open due to photophobia. Green discharge around eye. No visible hyphema.   Cardiovascular:     Rate and Rhythm: Normal rate and regular rhythm.     Pulses: Normal pulses.     Heart sounds: Normal heart sounds. No murmur heard.   No friction rub. No gallop.  Pulmonary:     Effort: Pulmonary effort is normal.     Breath sounds: Normal breath sounds.  Abdominal:     General: Abdomen is flat. There is no distension.     Palpations: Abdomen is soft.     Tenderness: There is no abdominal tenderness. There is no guarding or rebound.  Musculoskeletal:        General: Normal range of motion.     Cervical back: Neck supple.  Skin:    General: Skin is warm and dry.  Neurological:     General: No focal deficit present.     Mental Status: He is alert.  Psychiatric:        Mood and Affect: Mood normal.        Behavior: Behavior normal.    ED Results / Procedures / Treatments   Labs (all labs ordered are listed, but only abnormal results are displayed) Labs Reviewed - No data to display  EKG None  Radiology No results found.  Procedures Procedures   Medications Ordered in ED Medications  tetracaine (PONTOCAINE) 0.5 % ophthalmic solution 2 drop (0 drops Both Eyes Hold 04/27/21 1558)  fluorescein ophthalmic strip 1 strip (0  strips Both Eyes Hold 04/27/21 1558)  valACYclovir (VALTREX) tablet 1,000 mg (has no administration in time range)    ED Course  I have reviewed the triage vital signs and the nursing notes.  Pertinent labs & imaging results that were available during my care of the patient were reviewed by me and considered in my medical decision making (see chart for details).    MDM Rules/Calculators/A&P                           65 year old male presents to the ED due to left eye pain, edema, drainage, photophobia that started this morning.  Patient had an injury on Sunday where he was poked in the eye.  He has not received his shingles vaccine.  Upon arrival, stable vitals.  Patient in no acute distress.  Vesicles present to left forehead.  Eye with erythema, green drainage and some periorbital edema. Suspect shingles with ocular involvement. Discussed case with Dr. Manuella Ghazi with ophthalmology who will see patient tomorrow morning at 8 AM.  He recommends sending patient home with Valtrex 1 g 3 times daily x10 days. Visual acuity L 20/70 and right 20/100. Tetracaine solution placed for symptomatic relief. Unfortunately, unable to locate tonopen covers to check IOPs. No visible hyphema. Low suspicion for globe injury. Dr. Manuella Ghazi notes all eye testing can be performed in office tomorrow AM. Patient given first dose of Valtrex here in the ED and discharged with a prescription. Strict ED precautions discussed with patient. Patient states understanding and agrees to plan. Patient discharged home in no acute distress and stable vitals  Discussed case with Dr. Gilford Raid who agrees with assessment and plan.  Final Clinical Impression(s) / ED Diagnoses Final diagnoses:  Herpes zoster with ophthalmic complication, unspecified herpes zoster eye disease  Rx / DC Orders ED Discharge Orders          Ordered    valACYclovir (VALTREX) 1000 MG tablet  3 times daily        04/27/21 1614             Karie Kirks 04/27/21 1634    Isla Pence, MD 04/27/21 1759

## 2021-04-27 NOTE — ED Triage Notes (Signed)
Pt complains of left eye pain and swelling since waking this morning. States he his son accidentally hit his eye when helping put shirt on this weekend.

## 2021-04-27 NOTE — Discharge Instructions (Addendum)
It was a pleasure taking care of you today. As discussed, you most likely have shingles that is in your eye. I am sending you home with an antiviral. Take as prescribed and finish all antibiotics. You have an appointment tomorrow morning at [facility] with Dr. Manuella Ghazi. Please report to the office for further evaluation tomorrow morning. Return to the ER for new or worsening symptoms.

## 2021-04-30 ENCOUNTER — Other Ambulatory Visit: Payer: Self-pay

## 2021-04-30 DIAGNOSIS — C9110 Chronic lymphocytic leukemia of B-cell type not having achieved remission: Secondary | ICD-10-CM

## 2021-05-03 ENCOUNTER — Inpatient Hospital Stay: Payer: Medicare Other

## 2021-05-03 ENCOUNTER — Inpatient Hospital Stay: Payer: Medicare Other | Admitting: Hematology

## 2021-05-03 NOTE — Progress Notes (Signed)
Pt was no show back in May for MD appointment. Pt was rescheduled for today, message was left about appointment date and time as pt was UTR. Pt was no show today. Will attempt to reschedule 1 more time per Dr Irene Limbo and  also informed PCP / Dr Coletta Memos that pt was not coming to appointments.

## 2021-05-06 ENCOUNTER — Inpatient Hospital Stay: Payer: Medicare Other | Attending: Hematology

## 2021-05-06 ENCOUNTER — Inpatient Hospital Stay (HOSPITAL_BASED_OUTPATIENT_CLINIC_OR_DEPARTMENT_OTHER): Payer: Medicare Other | Admitting: Hematology

## 2021-05-06 ENCOUNTER — Other Ambulatory Visit: Payer: Self-pay

## 2021-05-06 VITALS — BP 145/75 | HR 96 | Temp 98.5°F | Resp 16 | Ht 67.0 in | Wt 177.3 lb

## 2021-05-06 DIAGNOSIS — Z7982 Long term (current) use of aspirin: Secondary | ICD-10-CM | POA: Insufficient documentation

## 2021-05-06 DIAGNOSIS — Z7902 Long term (current) use of antithrombotics/antiplatelets: Secondary | ICD-10-CM | POA: Insufficient documentation

## 2021-05-06 DIAGNOSIS — C9110 Chronic lymphocytic leukemia of B-cell type not having achieved remission: Secondary | ICD-10-CM

## 2021-05-06 DIAGNOSIS — Z8673 Personal history of transient ischemic attack (TIA), and cerebral infarction without residual deficits: Secondary | ICD-10-CM | POA: Diagnosis not present

## 2021-05-06 DIAGNOSIS — Z79899 Other long term (current) drug therapy: Secondary | ICD-10-CM | POA: Diagnosis not present

## 2021-05-06 DIAGNOSIS — D509 Iron deficiency anemia, unspecified: Secondary | ICD-10-CM | POA: Diagnosis not present

## 2021-05-06 LAB — CMP (CANCER CENTER ONLY)
ALT: 10 U/L (ref 0–44)
AST: 11 U/L — ABNORMAL LOW (ref 15–41)
Albumin: 4.2 g/dL (ref 3.5–5.0)
Alkaline Phosphatase: 113 U/L (ref 38–126)
Anion gap: 9 (ref 5–15)
BUN: 7 mg/dL — ABNORMAL LOW (ref 8–23)
CO2: 22 mmol/L (ref 22–32)
Calcium: 9.3 mg/dL (ref 8.9–10.3)
Chloride: 107 mmol/L (ref 98–111)
Creatinine: 0.92 mg/dL (ref 0.61–1.24)
GFR, Estimated: >60 mL/min (ref >60)
Glucose, Bld: 124 mg/dL — ABNORMAL HIGH (ref 70–99)
Potassium: 3.4 mmol/L — ABNORMAL LOW (ref 3.5–5.1)
Sodium: 138 mmol/L (ref 135–145)
Total Bilirubin: 1.2 mg/dL (ref 0.3–1.2)
Total Protein: 7.2 g/dL (ref 6.5–8.1)

## 2021-05-06 LAB — LACTATE DEHYDROGENASE: LDH: 268 U/L — ABNORMAL HIGH (ref 98–192)

## 2021-05-06 LAB — CBC WITH DIFFERENTIAL (CANCER CENTER ONLY)
Abs Immature Granulocytes: 0 10*3/uL (ref 0.00–0.07)
Basophils Absolute: 0.2 10*3/uL — ABNORMAL HIGH (ref 0.0–0.1)
Basophils Relative: 1 %
Eosinophils Absolute: 0 10*3/uL (ref 0.0–0.5)
Eosinophils Relative: 0 %
HCT: 33.7 % — ABNORMAL LOW (ref 39.0–52.0)
Hemoglobin: 11.4 g/dL — ABNORMAL LOW (ref 13.0–17.0)
Lymphocytes Relative: 86 %
Lymphs Abs: 20.4 10*3/uL — ABNORMAL HIGH (ref 0.7–4.0)
MCH: 25.7 pg — ABNORMAL LOW (ref 26.0–34.0)
MCHC: 33.8 g/dL (ref 30.0–36.0)
MCV: 76.1 fL — ABNORMAL LOW (ref 80.0–100.0)
Monocytes Absolute: 0.7 10*3/uL (ref 0.1–1.0)
Monocytes Relative: 3 %
Neutro Abs: 2.4 10*3/uL (ref 1.7–7.7)
Neutrophils Relative %: 10 %
Platelet Count: 215 10*3/uL (ref 150–400)
RBC: 4.43 MIL/uL (ref 4.22–5.81)
RDW: 16.7 % — ABNORMAL HIGH (ref 11.5–15.5)
WBC Count: 23.7 10*3/uL — ABNORMAL HIGH (ref 4.0–10.5)
nRBC: 0 % (ref 0.0–0.2)

## 2021-05-06 LAB — IRON AND TIBC
Iron: 49 ug/dL (ref 42–163)
Saturation Ratios: 14 % — ABNORMAL LOW (ref 20–55)
TIBC: 353 ug/dL (ref 202–409)
UIBC: 304 ug/dL (ref 117–376)

## 2021-05-06 LAB — FERRITIN: Ferritin: 47 ng/mL (ref 24–336)

## 2021-05-06 MED ORDER — VALACYCLOVIR HCL 500 MG PO TABS: 500.0000 mg | ORAL_TABLET | Freq: Two times a day (BID) | ORAL | 2 refills | Status: DC

## 2021-05-12 ENCOUNTER — Encounter: Payer: Self-pay | Admitting: Hematology

## 2021-05-12 NOTE — Progress Notes (Signed)
HEMATOLOGY/ONCOLOGY CLINIC NOTE  Date of Service:  .05/06/2021   Patient Care Team: Bernerd Limbo, MD as PCP - General (Family Medicine) Brunetta Genera, MD as Consulting Physician (Hematology)   CHIEF COMPLAINTS/PURPOSE OF CONSULTATION:  Small B-Cell Lymphoma  HISTORY OF PRESENTING ILLNESS:   [name] [name] is a wonderful 65 y.o. 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 The pt reports that he is doing well overall.   The pt reports that in August 2018 he felt a knot on the left side of his neck, which he spoke about with his PCP Dr. Coletta Memos. He then had an MRI in January 2019, a CXR in February 2019 and then a biopsy on 10/26/17 (results noted below). He denies any constitutional symptoms as noted below. He notes that the swelling on his neck has not increased in size and is not painful.   He notes that he gets congested at night and is only able to breathe out of one nostril; he denies having a deviated septum. He notes that he has tried to have this worked up for the last 4 years without success and has tried saline sprays without success. He notes that he wakes up with dry mouth.   The pt takes Amlodipine, 81mg  Aspirin, Atorvastatin, Cyclobenzaprine, Ferrousul, Losartan Potassium, Meloxicam, Metformin 500mg , Percocet, Protonix, Potassium Chloride, and Ambein.   Of note prior to the patient's visit today, pt has had Lymph node, needle/core biopsy, Left Supraclavicular completed on 10/31/17 with results revealing SMALL LYMPHOCYTIC LYMPHOMA.  10/26/17 Tissue Flow Cytometry revealed a Monoclonal B Cell population identified.    08/28/17 MRI revealed Extensive cervical adenopathy. Adenopathy also noted in the subpectoral nodes.   CXR on 10/11/17 was revealed to be Normal.   His 10/11/17 CBC revealed all values WNL except for % Lymph at 66.3%, Mono % at 3.3% and % Gran at 30.4%, Lymphs Abs at 6.4k, Hgb at 12.5, HCT at  37.5, MCV at 75.5, MCH at 25.1, RDW at 16.3, MPV at 6.4  On review of systems, pt reports left cervical lymph node swelling and denies fevers, chills, night sweats, unexpected weight loss, CP, SOB, abdominal pain or swelling, pain along the spine, changes in bowel habits, skin rashes, leg swelling, and any other symptoms.    On PMHx the pt reports Hyperlipidemia, Hypertensive reitonpathy, Hypopotassemia, DM Type 2, acid reflux, spinal stenosis.  On Social Hx the pt reports being a former smoker, having quit in 2012. He notes that he smoked about 5 cigars per week before quitting. He denies consuming much ETOH. He denies any recreational drug use. He denies any unsafe needle exposure.  On Family Hx the pt reports an uncle who had cancer but wasn't sure what kind.   INTERVAL HISTORY:  [name] [name] returns today regarding his Small B Cell Lymphoma. The patient's last visit with [place] was on 10/09/2019.  He was lost to follow-up for about a year and a half.  He is here with his son.  He has continued to have limitations from left-sided weakness due to his stroke last year.  He notes that he had visited the emergency room on 02/27/2021 for an abscess in her left axilla which was addressed. 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  He is still on treatment doses of Valtrex. Has outpatient ophthalmology follow-up. We discussed being  on prophylactic dose of Valtrex after he completes his course of treatment dose of Valtrex. He denies having had shingles vaccination.  Patient notes no fevers chills night sweats unexpected weight loss.  Does note significant fatigue though this could be multifactorial. Has not had any imaging to evaluate his lymphoma since February 2021. He is agreeable to getting a PET CT scan to evaluate current status of his lymphoma.  Lab results 05/06/2021 show CBC with a WBC count that is elevated at  23.7k with 20.4k lymphocytes.  Hemoglobin of 11.4 with an MCV of 76 platelet count of 215k CMP unremarkable other than a potassium level of 3.4 LDH is elevated at 268 Ferritin is 47 with an iron saturation of 14%    MEDICAL HISTORY:  Past Medical History:  Diagnosis Date   Allergic rhinitis, cause unspecified    Backache, unspecified    Body mass index 33.0-33.9, adult    Diabetes mellitus without complication (HCC)    Type II   Elevated blood pressure reading without diagnosis of hypertension    Esophageal reflux    Generalized pain    Heartburn    Insomnia, unspecified    Nonspecific reaction to tuberculin skin test without active tuberculosis(795.51)    Other abnormal blood chemistry    Other and unspecified hyperlipidemia    Other dyspnea and respiratory abnormality    Other nonspecific findings on examination of blood(790.99)    Pain in joint, lower leg    Pneumonia    1968   Sleep apnea    Does not use or own a CPAP   Tobacco use disorder    Unspecified essential hypertension     SURGICAL HISTORY: Past Surgical History:  Procedure Laterality Date   bil wrist surgery     BRONCHIAL BRUSHINGS  09/24/2019   Procedure: BRONCHIAL BRUSHINGS;  Surgeon: Candee Furbish, MD;  Location: [facility] ENDOSCOPY;  Service: Pulmonary;;   BRONCHIAL WASHINGS  09/24/2019   Procedure: BRONCHIAL WASHINGS;  Surgeon: Candee Furbish, MD;  Location: [facility] ENDOSCOPY;  Service: Pulmonary;;   ENDOSCOPIC CONCHA BULLOSA RESECTION Bilateral 05/18/2018   Procedure: ENDOSCOPIC CONCHA BULLOSA RESECTION;  Surgeon: Jerrell Belfast, MD;  Location: [place];  Service: ENT;  Laterality: Bilateral;   NASAL SEPTOPLASTY W/ TURBINOPLASTY Bilateral 05/18/2018   Procedure: NASAL SEPTOPLASTY WITH TURBINATE REDUCTION;  Surgeon: Jerrell Belfast, MD;  Location: [place];  Service: ENT;  Laterality: Bilateral;   ORIF FOREARM FRACTURE     right   plastic surgery to face     SINUS ENDO W/FUSION Bilateral 05/18/2018   Procedure:  ENDOSCOPIC SINUS SURGERY WITH NAVIGATION;  Surgeon: Jerrell Belfast, MD;  Location: [place];  Service: ENT;  Laterality: Bilateral;   VIDEO BRONCHOSCOPY N/A 09/24/2019   Procedure: VIDEO BRONCHOSCOPY WITHOUT FLUORO;  Surgeon: Candee Furbish, MD;  Location: [facility] ENDOSCOPY;  Service: Pulmonary;  Laterality: N/A;    SOCIAL HISTORY: Social History   Socioeconomic History   Marital status: Legally Separated    Spouse name: Not on file   Number of children: 1   Years of education: Not on file   Highest education level: Not on file  Occupational History   Not on file  Tobacco Use   Smoking status: Former    Packs/day: 0.20    Years: 20.00    Pack years: 4.00    Types: Cigars, Cigarettes    Quit date: 2014    Years since quitting: 8.7   Smokeless tobacco: Never  Vaping Use   Vaping Use:  Never used  Substance and Sexual Activity   Alcohol use: Not Currently    Comment: 2 40oz beers a week   Drug use: No   Sexual activity: Not on file  Other Topics Concern   Not on file  Social History Narrative   Leave with mother   Social Determinants of Health   Financial Resource Strain: Not on file  Food Insecurity: Not on file  Transportation Needs: Not on file  Physical Activity: Not on file  Stress: Not on file  Social Connections: Not on file  Intimate Partner Violence: Not on file    FAMILY HISTORY: Family History  Problem Relation Age of Onset   Diabetes Mother    Hypertension Mother    Cancer Paternal Uncle     ALLERGIES:  is allergic to lisinopril and tuberculin tests.  MEDICATIONS:  Current Outpatient Medications  Medication Sig Dispense Refill   aspirin EC 81 MG tablet Take 81 mg by mouth daily.     atorvastatin (LIPITOR) 20 MG tablet Take 1 tablet (20 mg total) by mouth daily. 30 tablet 0   clopidogrel (PLAVIX) 75 MG tablet Take 1 tablet (75 mg total) by mouth daily. 30 tablet 0   cyclobenzaprine (FLEXERIL) 10 MG tablet Take 10 mg by mouth at bedtime.      methylphenidate (RITALIN) 5 MG tablet Take 1 tablet (5 mg total) by mouth daily. 30 tablet 0   oxyCODONE-acetaminophen (PERCOCET/ROXICET) 5-325 MG tablet Take 2 tablets by mouth every 8 (eight) hours as needed (for pain). 6 tablet 0   valACYclovir (VALTREX) 500 MG tablet Take 1 tablet (500 mg total) by mouth 2 (two) times daily. Start after completing active treatment dose of Valtrex for herpes zoster of face/eye. 60 tablet 2   pantoprazole (PROTONIX) 40 MG tablet Take 1 tablet (40 mg total) by mouth at bedtime. (Patient not taking: No sig reported) 30 tablet 0   No current facility-administered medications for this visit.    REVIEW OF SYSTEMS:   A 10+ POINT REVIEW OF SYSTEMS WAS OBTAINED including neurology, dermatology, psychiatry, cardiac, respiratory, lymph, extremities, GI, GU, Musculoskeletal, constitutional, breasts, reproductive, HEENT.  All pertinent positives are noted in the HPI.  All others are negative.   PHYSICAL EXAMINATION: ECOG FS:1 - Symptomatic but completely ambulatory  Vitals:   05/06/21 0853  BP: (!) 145/75  Pulse: 96  Resp: 16  Temp: 98.5 F (36.9 C)  SpO2: 97%   Wt Readings from Last 3 Encounters:  05/06/21 177 lb 4.8 oz (80.4 kg)  02/27/21 165 lb (74.8 kg)  12/09/19 152 lb (68.9 kg)   Body mass index is 27.77 kg/m.    Exam was given in a chair  . GENERAL:alert, in no acute distress and comfortable SKIN: Healing left facial and left eyelid herpes zoster rash. EYES: conjunctiva are pink and non-injected, sclera anicteric OROPHARYNX: MMM, no exudates, no oropharyngeal erythema or ulceration NECK: supple, no JVD LYMPH: Few palpable cervical lymph nodes, no obviously palpable axillary or inguinal lymphadenopathy LUNGS: clear to auscultation b/l with normal respiratory effort HEART: regular rate & rhythm ABDOMEN:  normoactive bowel sounds , non tender, not distended. Extremity: no pedal edema PSYCH: alert & oriented x 3 with fluent speech NEURO:  Left-sided weakness from previous CVAmotor/sensory    LABORATORY DATA:  I have reviewed the data as listed  . CBC Latest Ref Rng & Units 05/06/2021 02/08/2020 12/12/2019  WBC 4.0 - 10.5 K/uL 23.7(H) 4.2 5.6  Hemoglobin 13.0 - 17.0 g/dL 11.4(L) 10.7(L)  11.2(L)  Hematocrit 39.0 - 52.0 % 33.7(L) 32.8(L) 32.9(L)  Platelets 150 - 400 K/uL 215 310 308    CBC    Component Value Date/Time   WBC 23.7 (H) 05/06/2021 0919   WBC 4.2 02/08/2020 1138   RBC 4.43 05/06/2021 0919   HGB 11.4 (L) 05/06/2021 0919   HCT 33.7 (L) 05/06/2021 0919   PLT 215 05/06/2021 0919   MCV 76.1 (L) 05/06/2021 0919   MCH 25.7 (L) 05/06/2021 0919   MCHC 33.8 05/06/2021 0919   RDW 16.7 (H) 05/06/2021 0919   LYMPHSABS 20.4 (H) 05/06/2021 0919   MONOABS 0.7 05/06/2021 0919   EOSABS 0.0 05/06/2021 0919   BASOSABS 0.2 (H) 05/06/2021 0919    CMP Latest Ref Rng & Units 05/06/2021 02/08/2020 12/12/2019  Glucose 70 - 99 mg/dL 124(H) 103(H) 112(H)  BUN 8 - 23 mg/dL 7(L) 5(L) 9  Creatinine 0.61 - 1.24 mg/dL 0.92 0.77 0.76  Sodium 135 - 145 mmol/L 138 138 134(L)  Potassium 3.5 - 5.1 mmol/L 3.4(L) 2.9(L) 3.7  Chloride 98 - 111 mmol/L 107 104 104  CO2 22 - 32 mmol/L 22 25 20(L)  Calcium 8.9 - 10.3 mg/dL 9.3 9.8 9.6  Total Protein 6.5 - 8.1 g/dL 7.2 - 6.9  Total Bilirubin 0.3 - 1.2 mg/dL 1.2 - 2.0(H)  Alkaline Phos 38 - 126 U/L 113 - 66  AST 15 - 41 U/L 11(L) - 8(L)  ALT 0 - 44 U/L 10 - 8   .[name] Lab Results  Component Value Date   LDH 268 (H) 05/06/2021     . Lab Results  Component Value Date   IRON 49 05/06/2021   TIBC 353 05/06/2021   IRONPCTSAT 14 (L) 05/06/2021   (Iron and TIBC)  Lab Results  Component Value Date   FERRITIN 47 05/06/2021    Component     Latest Ref Rng & Units 11/10/2017  Retic Ct Pct     0.8 - 1.8 % 1.3  RBC.     4.20 - 5.82 MIL/uL 4.75  Retic Count, Absolute     34.8 - 93.9 K/uL 61.8  LDH     125 - 245 U/L 152  HCV Ab     0.0 - 0.9 s/co ratio <0.1  HIV Screen 4th Generation  wRfx     Non Reactive Non Reactive  Hepatitis B Surface Ag     Negative Negative  Hep B Core Ab, Tot     Negative Negative   05/15/2019: NUCLEAR MEDICINE PET SKULL BASE TO THIGH   10/31/17 Tissue Flow Cytometry:     10/31/17 Lymph Node Needle/Core Biopsy:  11/10/17 Peripheral Blood Flow Cytometry:   11/10/17 FISH CLL Prognostic Panel:      RADIOGRAPHIC STUDIES: I have personally reviewed the radiological images as listed and agreed with the findings in the report.  08/28/17 MRI     No results found.   ASSESSMENT & PLAN:  [name] [name] is a 65 y.o. male with:  1. Small B-Cell Lymphoma /Chronic lymphocytic Leukemia.  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 Newly noted Microcytic anemia with drop in hgb from 11.9 to 9.1 with severe iron deficiency. Not primarily related to lymphoma.  12/11/18 CT C/A/P which revealed "Interval progressive disease. Significant increase in adenopathy within the abdomen/pelvis. More mild enlargement of lymph nodes within the chest. 2. No splenomegaly. 3. Aortic Atherosclerosis."  05/15/2019 PET scan revealing "1. New focal nodular hypermetabolic left upper lobe pulmonary nodule, maximum SUV 12.2  compatible with Deauville 5 activity. There is also some worsening in Deauville 4 right upper paratracheal Adenopathy. 2. In the neck, there continue to be widespread enlarged lymph nodes similar to the prior exam, currently Deauville 3. 3. In the axillary regions, adenopathy is mildly reduced both in size and activity, currently Deauville 2.4. In the abdomen, bulky adenopathy appears mildly increased in size in some locations, but still in the Deauville 2 to Deauville 3 range."  2. Iron deficiency Anemia - unclear etiology -- will need GI workup - he notes that his GI workup was delayed due to COVID related scheduling issues .  This was also again delayed due to his stroke.  And now other acute issues including chest wall pain and herpes  zoster of the face and with eye involvement.  3.  History of CVA due to multiple risk factors including hypertension, dyslipidemia and diabetes.  Residual left-sided weakness. PLAN: -Discussed pt labwork today,  Lab results 05/06/2021 show CBC with a WBC count that is elevated at 23.7k with 20.4k lymphocytes.  Hemoglobin of 11.4 with an MCV of 76 platelet count of 215k CMP unremarkable other than a potassium level of 3.4 LDH is elevated at 268 Ferritin is 47 with an iron saturation of 14% -Increasing leukocytosis/lymphocytosis suggestive of disease progression. -We will get a PET CT scan to evaluate extent of disease/bulk of his disease. -He is currently focused on recovering from his herpes zoster infection of the face and will complete his current treatment dose of Valtrex for 14 days and will then start prophylactic Valtrex 500 mg p.o. twice daily to reduce risk of additional shingles recurrence at this time. -Would recommend p.o. iron polysaccharide 150 mg p.o. daily.   FOLLOW UP: PET/CT in 1 week Phone visit with Dr. Irene Limbo in 2 weeks    . The total time spent in the appointment was 32 minutes and more than 50% was on counseling and direct patient cares.   All of the patient's questions were answered with apparent satisfaction. The patient knows to call the clinic with any problems, questions or concerns.   Sullivan Lone MD [place] AAHIVMS [facility] Hematology/Oncology Physician [facility]

## 2021-05-20 ENCOUNTER — Inpatient Hospital Stay: Payer: Medicare Other | Admitting: Hematology

## 2021-05-20 ENCOUNTER — Telehealth: Payer: Self-pay | Admitting: Hematology

## 2021-05-20 NOTE — Telephone Encounter (Signed)
R/s phone visit. Called and left msg.

## 2021-05-27 ENCOUNTER — Other Ambulatory Visit: Payer: Self-pay

## 2021-05-27 ENCOUNTER — Encounter (HOSPITAL_COMMUNITY)
Admission: RE | Admit: 2021-05-27 | Discharge: 2021-05-27 | Disposition: A | Discharge status: Home / Self Care | Payer: Medicare Other | Source: Ambulatory Visit | Attending: Hematology | Admitting: Hematology

## 2021-05-27 DIAGNOSIS — R590 Localized enlarged lymph nodes: Secondary | ICD-10-CM | POA: Insufficient documentation

## 2021-05-27 DIAGNOSIS — J984 Other disorders of lung: Secondary | ICD-10-CM | POA: Insufficient documentation

## 2021-05-27 DIAGNOSIS — C9110 Chronic lymphocytic leukemia of B-cell type not having achieved remission: Secondary | ICD-10-CM | POA: Diagnosis present

## 2021-05-27 LAB — GLUCOSE, CAPILLARY: Glucose-Capillary: 106 mg/dL — ABNORMAL HIGH (ref 70–99)

## 2021-05-27 MED ORDER — FLUDEOXYGLUCOSE F - 18 (FDG) INJECTION
8.8300 | Freq: Once | INTRAVENOUS | Status: AC
  Administered 2021-05-27: 8.83 via INTRAVENOUS

## 2021-05-28 ENCOUNTER — Ambulatory Visit (HOSPITAL_COMMUNITY): Admission: RE | Admit: 2021-05-28 | Payer: Medicare Other | Source: Ambulatory Visit

## 2021-05-31 ENCOUNTER — Other Ambulatory Visit: Payer: Self-pay

## 2021-05-31 ENCOUNTER — Inpatient Hospital Stay: Payer: Medicare Other | Attending: Hematology | Admitting: Hematology

## 2021-06-07 ENCOUNTER — Telehealth: Payer: Self-pay | Admitting: Hematology

## 2021-06-07 ENCOUNTER — Other Ambulatory Visit: Payer: Self-pay | Admitting: Hematology

## 2021-06-07 ENCOUNTER — Encounter: Payer: Self-pay | Admitting: Hematology

## 2021-06-07 NOTE — Telephone Encounter (Signed)
Scheduled per sch msg. Called and left msg. Mailed printout  

## 2021-06-07 NOTE — Progress Notes (Signed)
This encounter was created in error - please disregard.

## 2021-06-07 NOTE — Progress Notes (Signed)
Patient was not contactable on 05/31/2021 for his phone visit despite multiple phone calls. I was finally able to get in touch with the patient and his son a few days later on 06/05/2021 and discussed the findings of his  PET CT scan Incidental CT findings: none   IMPRESSION: Significant progression extensive lymphadenopathy involving the neck, chest, abdomen and pelvis as detailed above. The neck has Deauville 3 and 4 disease. The chest has mainly Deauville 3 disease. The abdomen has Deauville 3 disease.   Nodular airspace disease in the left lower lobe is hypermetabolic and could represent a new site of intrapulmonary disease or possibly a pulmonary infection. Correlation with clinical findings and short-term noncontrast chest CT in 3 months may be helpful.   No skeletal involvement.     -We discussed that he has pretty extensive disease on his PET CT scan. -Patient's son notes that he has developed some swelling and a new rash over his left eye again in the context of previous recent shingles infection.  He completed a course of Valtrex and was on Valtrex prophylaxis and has developed this rash despite that.  Patient and his son recommended to take him to urgent care or the emergency room for reevaluation of his left eye to see if he has recurrent shingles although there is secondary bacterial infection. -If he has recurrent shingles despite Valtrex prophylaxis and his immunosuppressed state he might need IV acyclovir and admission for this. -We shall reschedule him for a follow-up in 3 to 4 weeks to reevaluate and discuss treatment options once his infections are stable.  Brunetta Genera

## 2021-06-24 ENCOUNTER — Other Ambulatory Visit: Payer: Self-pay

## 2021-06-24 DIAGNOSIS — C9110 Chronic lymphocytic leukemia of B-cell type not having achieved remission: Secondary | ICD-10-CM

## 2021-06-25 ENCOUNTER — Inpatient Hospital Stay: Payer: Medicare Other | Attending: Hematology | Admitting: Hematology

## 2021-06-25 ENCOUNTER — Inpatient Hospital Stay: Payer: Medicare Other

## 2021-06-28 ENCOUNTER — Telehealth: Payer: Self-pay | Admitting: Hematology

## 2021-06-28 NOTE — Telephone Encounter (Signed)
Sch per 11/4 inbasket, left msg

## 2021-07-19 ENCOUNTER — Inpatient Hospital Stay: Payer: Medicare Other | Admitting: Hematology

## 2021-07-19 ENCOUNTER — Inpatient Hospital Stay: Payer: Medicare Other

## 2021-07-19 NOTE — Progress Notes (Incomplete)
HEMATOLOGY/ONCOLOGY CLINIC NOTE  Date of Service:  .05/06/2021   Patient Care Team: Bernerd Limbo, MD as PCP - General (Family Medicine) Brunetta Genera, MD as Consulting Physician (Hematology)   CHIEF COMPLAINTS/PURPOSE OF CONSULTATION:  Small B-Cell Lymphoma  HISTORY OF PRESENTING ILLNESS:   [name] [name] is a wonderful 65 y.o. 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 The pt reports that he is doing well overall.   The pt reports that in August 2018 he felt a knot on the left side of his neck, which he spoke about with his PCP Dr. Coletta Memos. He then had an MRI in January 2019, a CXR in February 2019 and then a biopsy on 10/26/17 (results noted below). He denies any constitutional symptoms as noted below. He notes that the swelling on his neck has not increased in size and is not painful.   He notes that he gets congested at night and is only able to breathe out of one nostril; he denies having a deviated septum. He notes that he has tried to have this worked up for the last 4 years without success and has tried saline sprays without success. He notes that he wakes up with dry mouth.   The pt takes Amlodipine, 81mg  Aspirin, Atorvastatin, Cyclobenzaprine, Ferrousul, Losartan Potassium, Meloxicam, Metformin 500mg , Percocet, Protonix, Potassium Chloride, and Ambein.   Of note prior to the patient's visit today, pt has had Lymph node, needle/core biopsy, Left Supraclavicular completed on 10/31/17 with results revealing SMALL LYMPHOCYTIC LYMPHOMA.  10/26/17 Tissue Flow Cytometry revealed a Monoclonal B Cell population identified.    08/28/17 MRI revealed Extensive cervical adenopathy. Adenopathy also noted in the subpectoral nodes.   CXR on 10/11/17 was revealed to be Normal.   His 10/11/17 CBC revealed all values WNL except for % Lymph at 66.3%, Mono % at 3.3% and % Gran at 30.4%, Lymphs Abs at 6.4k, Hgb at 12.5, HCT at  37.5, MCV at 75.5, MCH at 25.1, RDW at 16.3, MPV at 6.4  On review of systems, pt reports left cervical lymph node swelling and denies fevers, chills, night sweats, unexpected weight loss, CP, SOB, abdominal pain or swelling, pain along the spine, changes in bowel habits, skin rashes, leg swelling, and any other symptoms.    On PMHx the pt reports Hyperlipidemia, Hypertensive reitonpathy, Hypopotassemia, DM Type 2, acid reflux, spinal stenosis.  On Social Hx the pt reports being a former smoker, having quit in 2012. He notes that he smoked about 5 cigars per week before quitting. He denies consuming much ETOH. He denies any recreational drug use. He denies any unsafe needle exposure.  On Family Hx the pt reports an uncle who had cancer but wasn't sure what kind.   INTERVAL HISTORY:  [name] [name] returns today regarding his Small B Cell Lymphoma. The patient's last visit with [place] was on 10/09/2019.  He was lost to follow-up for about a year and a half.  He is here with his son.  He has continued to have limitations from left-sided weakness due to his stroke last year.  He notes that he had visited the emergency room on 02/27/2021 for an abscess in her left axilla which was addressed. 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  He is still on treatment doses of Valtrex. Has outpatient ophthalmology follow-up. We discussed being  on prophylactic dose of Valtrex after he completes his course of treatment dose of Valtrex. He denies having had shingles vaccination.  Patient notes no fevers chills night sweats unexpected weight loss.  Does note significant fatigue though this could be multifactorial. Has not had any imaging to evaluate his lymphoma since February 2021. He is agreeable to getting a PET CT scan to evaluate current status of his lymphoma.  Lab results 05/06/2021 show CBC with a WBC count that is elevated at  23.7k with 20.4k lymphocytes.  Hemoglobin of 11.4 with an MCV of 76 platelet count of 215k CMP unremarkable other than a potassium level of 3.4 LDH is elevated at 268 Ferritin is 47 with an iron saturation of 14%  Today, ***    MEDICAL HISTORY:  Past Medical History:  Diagnosis Date   Allergic rhinitis, cause unspecified    Backache, unspecified    Body mass index 33.0-33.9, adult    Diabetes mellitus without complication (HCC)    Type II   Elevated blood pressure reading without diagnosis of hypertension    Esophageal reflux    Generalized pain    Heartburn    Insomnia, unspecified    Nonspecific reaction to tuberculin skin test without active tuberculosis(795.51)    Other abnormal blood chemistry    Other and unspecified hyperlipidemia    Other dyspnea and respiratory abnormality    Other nonspecific findings on examination of blood(790.99)    Pain in joint, lower leg    Pneumonia    1968   Sleep apnea    Does not use or own a CPAP   Tobacco use disorder    Unspecified essential hypertension     SURGICAL HISTORY: Past Surgical History:  Procedure Laterality Date   bil wrist surgery     BRONCHIAL BRUSHINGS  09/24/2019   Procedure: BRONCHIAL BRUSHINGS;  Surgeon: Candee Furbish, MD;  Location: [facility] ENDOSCOPY;  Service: Pulmonary;;   BRONCHIAL WASHINGS  09/24/2019   Procedure: BRONCHIAL WASHINGS;  Surgeon: Candee Furbish, MD;  Location[facility] ENDOSCOPY;  Service: Pulmonary;;   ENDOSCOPIC CONCHA BULLOSA RESECTION Bilateral 05/18/2018   Procedure: ENDOSCOPIC CONCHA BULLOSA RESECTION;  Surgeon: Jerrell Belfast, MD;  Location: [employer] [facility];  Service: ENT;  Laterality: Bilateral;   NASAL SEPTOPLASTY W/ TURBINOPLASTY Bilateral 05/18/2018   Procedure: NASAL SEPTOPLASTY WITH TURBINATE REDUCTION;  Surgeon: Jerrell Belfast, MD;  Location: [place];  Service: ENT;  Laterality: Bilateral;   ORIF FOREARM FRACTURE     right   plastic surgery to face     SINUS ENDO W/FUSION Bilateral 05/18/2018    Procedure: ENDOSCOPIC SINUS SURGERY WITH NAVIGATION;  Surgeon: Jerrell Belfast, MD;  Location: [place];  Service: ENT;  Laterality: Bilateral;   VIDEO BRONCHOSCOPY N/A 09/24/2019   Procedure: VIDEO BRONCHOSCOPY WITHOUT FLUORO;  Surgeon: Candee Furbish, MD;  Location: [place];  Service: Pulmonary;  Laterality: N/A;    SOCIAL HISTORY: Social History   Socioeconomic History   Marital status: Legally Separated    Spouse name: Not on file   Number of children: 1   Years of education: Not on file   Highest education level: Not on file  Occupational History   Not on file  Tobacco Use   Smoking status: Former    Packs/day: 0.20    Years: 20.00    Pack years: 4.00    Types: Cigars, Cigarettes    Quit date: 2014    Years since quitting: 8.9   Smokeless tobacco: Never  Vaping Use  Vaping Use: Never used  Substance and Sexual Activity   Alcohol use: Not Currently    Comment: 2 40oz beers a week   Drug use: No   Sexual activity: Not on file  Other Topics Concern   Not on file  Social History Narrative   Leave with mother   Social Determinants of Health   Financial Resource Strain: Not on file  Food Insecurity: Not on file  Transportation Needs: Not on file  Physical Activity: Not on file  Stress: Not on file  Social Connections: Not on file  Intimate Partner Violence: Not on file    FAMILY HISTORY: Family History  Problem Relation Age of Onset   Diabetes Mother    Hypertension Mother    Cancer Paternal Uncle     ALLERGIES:  is allergic to lisinopril and tuberculin tests.  MEDICATIONS:  Current Outpatient Medications  Medication Sig Dispense Refill   aspirin EC 81 MG tablet Take 81 mg by mouth daily.     atorvastatin (LIPITOR) 20 MG tablet Take 1 tablet (20 mg total) by mouth daily. 30 tablet 0   clopidogrel (PLAVIX) 75 MG tablet Take 1 tablet (75 mg total) by mouth daily. 30 tablet 0   cyclobenzaprine (FLEXERIL) 10 MG tablet Take 10 mg by mouth at bedtime.      methylphenidate (RITALIN) 5 MG tablet Take 1 tablet (5 mg total) by mouth daily. 30 tablet 0   oxyCODONE-acetaminophen (PERCOCET/ROXICET) 5-325 MG tablet Take 2 tablets by mouth every 8 (eight) hours as needed (for pain). 6 tablet 0   pantoprazole (PROTONIX) 40 MG tablet Take 1 tablet (40 mg total) by mouth at bedtime. (Patient not taking: No sig reported) 30 tablet 0   valACYclovir (VALTREX) 500 MG tablet Take 1 tablet (500 mg total) by mouth 2 (two) times daily. Start after completing active treatment dose of Valtrex for herpes zoster of face/eye. 60 tablet 2   No current facility-administered medications for this visit.    REVIEW OF SYSTEMS:   A 10+ POINT REVIEW OF SYSTEMS WAS OBTAINED including neurology, dermatology, psychiatry, cardiac, respiratory, lymph, extremities, GI, GU, Musculoskeletal, constitutional, breasts, reproductive, HEENT.  All pertinent positives are noted in the HPI.  All others are negative.   PHYSICAL EXAMINATION: ECOG FS:1 - Symptomatic but completely ambulatory  There were no vitals filed for this visit.  Wt Readings from Last 3 Encounters:  05/06/21 177 lb 4.8 oz (80.4 kg)  02/27/21 165 lb (74.8 kg)  12/09/19 152 lb (68.9 kg)   There is no height or weight on file to calculate BMI.    Exam was given in a chair  . GENERAL:alert, in no acute distress and comfortable SKIN: Healing left facial and left eyelid herpes zoster rash. EYES: conjunctiva are pink and non-injected, sclera anicteric OROPHARYNX: MMM, no exudates, no oropharyngeal erythema or ulceration NECK: supple, no JVD LYMPH: Few palpable cervical lymph nodes, no obviously palpable axillary or inguinal lymphadenopathy LUNGS: clear to auscultation b/l with normal respiratory effort HEART: regular rate & rhythm ABDOMEN:  normoactive bowel sounds , non tender, not distended. Extremity: no pedal edema PSYCH: alert & oriented x 3 with fluent speech NEURO: Left-sided weakness from previous  CVAmotor/sensory    LABORATORY DATA:  I have reviewed the data as listed  . CBC Latest Ref Rng & Units 05/06/2021 02/08/2020 12/12/2019  WBC 4.0 - 10.5 K/uL 23.7(H) 4.2 5.6  Hemoglobin 13.0 - 17.0 g/dL 11.4(L) 10.7(L) 11.2(L)  Hematocrit 39.0 - 52.0 % 33.7(L) 32.8(L) 32.9(L)  Platelets 150 - 400 K/uL 215 310 308    CBC    Component Value Date/Time   WBC 23.7 (H) 05/06/2021 0919   WBC 4.2 02/08/2020 1138   RBC 4.43 05/06/2021 0919   HGB 11.4 (L) 05/06/2021 0919   HCT 33.7 (L) 05/06/2021 0919   PLT 215 05/06/2021 0919   MCV 76.1 (L) 05/06/2021 0919   MCH 25.7 (L) 05/06/2021 0919   MCHC 33.8 05/06/2021 0919   RDW 16.7 (H) 05/06/2021 0919   LYMPHSABS 20.4 (H) 05/06/2021 0919   MONOABS 0.7 05/06/2021 0919   EOSABS 0.0 05/06/2021 0919   BASOSABS 0.2 (H) 05/06/2021 0919    CMP Latest Ref Rng & Units 05/06/2021 02/08/2020 12/12/2019  Glucose 70 - 99 mg/dL 124(H) 103(H) 112(H)  BUN 8 - 23 mg/dL 7(L) 5(L) 9  Creatinine 0.61 - 1.24 mg/dL 0.92 0.77 0.76  Sodium 135 - 145 mmol/L 138 138 134(L)  Potassium 3.5 - 5.1 mmol/L 3.4(L) 2.9(L) 3.7  Chloride 98 - 111 mmol/L 107 104 104  CO2 22 - 32 mmol/L 22 25 20(L)  Calcium 8.9 - 10.3 mg/dL 9.3 9.8 9.6  Total Protein 6.5 - 8.1 g/dL 7.2 - 6.9  Total Bilirubin 0.3 - 1.2 mg/dL 1.2 - 2.0(H)  Alkaline Phos 38 - 126 U/L 113 - 66  AST 15 - 41 U/L 11(L) - 8(L)  ALT 0 - 44 U/L 10 - 8   .[name] Lab Results  Component Value Date   LDH 268 (H) 05/06/2021     . Lab Results  Component Value Date   IRON 49 05/06/2021   TIBC 353 05/06/2021   IRONPCTSAT 14 (L) 05/06/2021   (Iron and TIBC)  Lab Results  Component Value Date   FERRITIN 47 05/06/2021    Component     Latest Ref Rng & Units 11/10/2017  Retic Ct Pct     0.8 - 1.8 % 1.3  RBC.     4.20 - 5.82 MIL/uL 4.75  Retic Count, Absolute     34.8 - 93.9 K/uL 61.8  LDH     125 - 245 U/L 152  HCV Ab     0.0 - 0.9 s/co ratio <0.1  HIV Screen 4th Generation wRfx     Non Reactive Non  Reactive  Hepatitis B Surface Ag     Negative Negative  Hep B Core Ab, Tot     Negative Negative   05/15/2019: NUCLEAR MEDICINE PET SKULL BASE TO THIGH   10/31/17 Tissue Flow Cytometry:     10/31/17 Lymph Node Needle/Core Biopsy:  11/10/17 Peripheral Blood Flow Cytometry:   11/10/17 FISH CLL Prognostic Panel:      RADIOGRAPHIC STUDIES: I have personally reviewed the radiological images as listed and agreed with the findings in the report.  08/28/17 MRI     No results found.   ASSESSMENT & PLAN:  [name] [name] is a 65 y.o. male with:  1. Small B-Cell Lymphoma /Chronic lymphocytic Leukemia.  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 Newly noted Microcytic anemia with drop in hgb from 11.9 to 9.1 with severe iron deficiency. Not primarily related to lymphoma.  12/11/18 CT C/A/P which revealed "Interval progressive disease. Significant increase in adenopathy within the abdomen/pelvis. More mild enlargement of lymph nodes within the chest. 2. No splenomegaly. 3. Aortic Atherosclerosis."  05/15/2019 PET scan revealing "1. New focal nodular hypermetabolic left upper lobe pulmonary nodule, maximum SUV 12.2 compatible with Deauville 5 activity. There is also some worsening in  Deauville 4 right upper paratracheal Adenopathy. 2. In the neck, there continue to be widespread enlarged lymph nodes similar to the prior exam, currently Deauville 3. 3. In the axillary regions, adenopathy is mildly reduced both in size and activity, currently Deauville 2.4. In the abdomen, bulky adenopathy appears mildly increased in size in some locations, but still in the Deauville 2 to Deauville 3 range."  2. Iron deficiency Anemia - unclear etiology -- will need GI workup - he notes that his GI workup was delayed due to COVID related scheduling issues .  This was also again delayed due to his stroke.  And now other acute issues including chest wall pain and herpes zoster of the face and  with eye involvement.  3.  History of CVA due to multiple risk factors including hypertension, dyslipidemia and diabetes.  Residual left-sided weakness. PLAN: -Discussed pt labwork today,  Lab results 05/06/2021 show CBC with a WBC count that is elevated at 23.7k with 20.4k lymphocytes.  Hemoglobin of 11.4 with an MCV of 76 platelet count of 215k CMP unremarkable other than a potassium level of 3.4 LDH is elevated at 268 Ferritin is 47 with an iron saturation of 14% -Increasing leukocytosis/lymphocytosis suggestive of disease progression. -We will get a PET CT scan to evaluate extent of disease/bulk of his disease. -He is currently focused on recovering from his herpes zoster infection of the face and will complete his current treatment dose of Valtrex for 14 days and will then start prophylactic Valtrex 500 mg p.o. twice daily to reduce risk of additional shingles recurrence at this time. -Would recommend p.o. iron polysaccharide 150 mg p.o. daily.   FOLLOW UP: PET/CT in 1 week Phone visit with Dr. Irene Limbo in 2 weeks    . The total time spent in the appointment was 32 minutes and more than 50% was on counseling and direct patient cares.   All of the patient's questions were answered with apparent satisfaction. The patient knows to call the clinic with any problems, questions or concerns.   I,Zite Okoli,acting as a Education administrator for Sullivan Lone, MD.,have documented all relevant documentation on the behalf of Sullivan Lone, MD,as directed by  Sullivan Lone, MD while in the presence of Sullivan Lone, MD.   ***   Sullivan Lone MD [place] AAHIVMS [facility] Hematology/Oncology Physician [facility]

## 2021-07-29 ENCOUNTER — Other Ambulatory Visit: Payer: Self-pay

## 2021-07-29 ENCOUNTER — Emergency Department (HOSPITAL_COMMUNITY)
Admission: EM | Admit: 2021-07-29 | Discharge: 2021-07-29 | Disposition: A | Discharge status: Home / Self Care | Payer: Medicare Other | Attending: Emergency Medicine | Admitting: Emergency Medicine

## 2021-07-29 ENCOUNTER — Encounter (HOSPITAL_COMMUNITY): Payer: Self-pay

## 2021-07-29 DIAGNOSIS — Z87891 Personal history of nicotine dependence: Secondary | ICD-10-CM | POA: Insufficient documentation

## 2021-07-29 DIAGNOSIS — E119 Type 2 diabetes mellitus without complications: Secondary | ICD-10-CM | POA: Diagnosis not present

## 2021-07-29 DIAGNOSIS — Z7902 Long term (current) use of antithrombotics/antiplatelets: Secondary | ICD-10-CM | POA: Insufficient documentation

## 2021-07-29 DIAGNOSIS — Z7982 Long term (current) use of aspirin: Secondary | ICD-10-CM | POA: Diagnosis not present

## 2021-07-29 DIAGNOSIS — U071 COVID-19: Secondary | ICD-10-CM | POA: Diagnosis not present

## 2021-07-29 DIAGNOSIS — Z79899 Other long term (current) drug therapy: Secondary | ICD-10-CM | POA: Insufficient documentation

## 2021-07-29 DIAGNOSIS — R42 Dizziness and giddiness: Secondary | ICD-10-CM

## 2021-07-29 LAB — COMPREHENSIVE METABOLIC PANEL
ALT: 13 U/L (ref 0–44)
AST: 16 U/L (ref 15–41)
Albumin: 4.1 g/dL (ref 3.5–5.0)
Alkaline Phosphatase: 88 U/L (ref 38–126)
Anion gap: 11 (ref 5–15)
BUN: 10 mg/dL (ref 8–23)
CO2: 21 mmol/L — ABNORMAL LOW (ref 22–32)
Calcium: 8.5 mg/dL — ABNORMAL LOW (ref 8.9–10.3)
Chloride: 106 mmol/L (ref 98–111)
Creatinine, Ser: 0.8 mg/dL (ref 0.61–1.24)
GFR, Estimated: >60 mL/min (ref >60)
Glucose, Bld: 249 mg/dL — ABNORMAL HIGH (ref 70–99)
Potassium: 3.4 mmol/L — ABNORMAL LOW (ref 3.5–5.1)
Sodium: 138 mmol/L (ref 135–145)
Total Bilirubin: 1.6 mg/dL — ABNORMAL HIGH (ref 0.3–1.2)
Total Protein: 6.9 g/dL (ref 6.5–8.1)

## 2021-07-29 LAB — CBC
HCT: 28.4 % — ABNORMAL LOW (ref 39.0–52.0)
Hemoglobin: 9.4 g/dL — ABNORMAL LOW (ref 13.0–17.0)
MCH: 29.9 pg (ref 26.0–34.0)
MCHC: 33.1 g/dL (ref 30.0–36.0)
MCV: 90.4 fL (ref 80.0–100.0)
Platelets: 166 10*3/uL (ref 150–400)
RBC: 3.14 MIL/uL — ABNORMAL LOW (ref 4.22–5.81)
RDW: 18 % — ABNORMAL HIGH (ref 11.5–15.5)
WBC: 45.4 10*3/uL — ABNORMAL HIGH (ref 4.0–10.5)
nRBC: 0 % (ref 0.0–0.2)

## 2021-07-29 LAB — DIFFERENTIAL
Abs Immature Granulocytes: 0.07 10*3/uL (ref 0.00–0.07)
Basophils Absolute: 0.1 10*3/uL (ref 0.0–0.1)
Basophils Relative: 0 %
Eosinophils Absolute: 0 10*3/uL (ref 0.0–0.5)
Eosinophils Relative: 0 %
Immature Granulocytes: 0 %
Lymphocytes Relative: 70 %
Lymphs Abs: 31 10*3/uL — ABNORMAL HIGH (ref 0.7–4.0)
Monocytes Absolute: 8.6 10*3/uL — ABNORMAL HIGH (ref 0.1–1.0)
Monocytes Relative: 19 %
Neutro Abs: 4.7 10*3/uL (ref 1.7–7.7)
Neutrophils Relative %: 11 %

## 2021-07-29 LAB — RESP PANEL BY RT-PCR (FLU A&B, COVID) ARPGX2
Influenza A by PCR: NEGATIVE
Influenza B by PCR: NEGATIVE
SARS Coronavirus 2 by RT PCR: POSITIVE — AB

## 2021-07-29 LAB — CBG MONITORING, ED: Glucose-Capillary: 231 mg/dL — ABNORMAL HIGH (ref 70–99)

## 2021-07-29 LAB — LIPASE, BLOOD: Lipase: 21 U/L (ref 11–51)

## 2021-07-29 MED ORDER — ONDANSETRON 8 MG PO TBDP: 8.0000 mg | ORAL_TABLET | Freq: Three times a day (TID) | ORAL | 0 refills | Status: DC | PRN

## 2021-07-29 MED ORDER — DIAZEPAM 5 MG PO TABS: 2.5000 mg | ORAL_TABLET | Freq: Three times a day (TID) | ORAL | 0 refills | Status: DC | PRN

## 2021-07-29 MED ORDER — DIAZEPAM 5 MG/ML IJ SOLN
2.5000 mg | Freq: Once | INTRAMUSCULAR | Status: AC
  Administered 2021-07-29: 2.5 mg via INTRAVENOUS
  Filled 2021-07-29: qty 2

## 2021-07-29 MED ORDER — MOLNUPIRAVIR EUA 200MG CAPSULE: 4.0000 | ORAL_CAPSULE | Freq: Two times a day (BID) | ORAL | 0 refills | Status: AC

## 2021-07-29 MED ORDER — ONDANSETRON 4 MG PO TBDP
4.0000 mg | ORAL_TABLET | Freq: Once | ORAL | Status: AC | PRN
  Administered 2021-07-29: 4 mg via ORAL
  Filled 2021-07-29: qty 1

## 2021-07-29 MED ORDER — ONDANSETRON HCL 4 MG/2ML IJ SOLN: 4.0000 mg | Freq: Once | INTRAMUSCULAR | Status: DC

## 2021-07-29 NOTE — ED Notes (Signed)
Son on file is pt's caretaker, he would like to be updated by phone because he just tested positive for Covid 3 days ago and cannot make it up here.

## 2021-07-29 NOTE — ED Notes (Signed)
Lab draw x2 unsuccessful RN aware

## 2021-07-29 NOTE — ED Provider Notes (Signed)
[place] DEPT Provider Note: Georgena Spurling, MD, FACEP  CSN: [id number] MRN: [id number] ARRIVAL: 07/29/21 at [place]: [address]  Dizziness   HISTORY OF PRESENT ILLNESS  07/29/21 5:30 AM [name] [name] is a 65 y.o. 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  He saw his primary care physician about this.  He also had an episode of vertigo about a month ago.  He is here this morning with a second episode of vertigo that began about 1:30 AM.  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  It is worse with movement of his head.  It has been associated with nausea.  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   Past Medical History:  Diagnosis Date   Allergic rhinitis, cause unspecified    Backache, unspecified    Body mass index 33.0-33.9, adult    Diabetes mellitus without complication (HCC)    Type II   Elevated blood pressure reading without diagnosis of hypertension    Esophageal reflux    Generalized pain    Heartburn    Insomnia, unspecified    Nonspecific reaction to tuberculin skin test without active tuberculosis(795.51)    Other abnormal blood chemistry    Other and unspecified hyperlipidemia    Other dyspnea and respiratory abnormality    Other nonspecific findings on examination of blood(790.99)    Pain in joint, lower leg    Pneumonia    1968   Sleep apnea    Does not use or own a CPAP   Tobacco use disorder    Unspecified essential hypertension     Past Surgical History:  Procedure Laterality Date   bil wrist surgery     BRONCHIAL BRUSHINGS  09/24/2019   Procedure: BRONCHIAL BRUSHINGS;  Surgeon: Candee Furbish, MD;  Location: [facility] ENDOSCOPY;  Service: Pulmonary;;   BRONCHIAL  WASHINGS  09/24/2019   Procedure: BRONCHIAL WASHINGS;  Surgeon: Candee Furbish, MD;  Location: [facility] ENDOSCOPY;  Service: Pulmonary;;   ENDOSCOPIC CONCHA BULLOSA RESECTION Bilateral 05/18/2018   Procedure: ENDOSCOPIC CONCHA BULLOSA RESECTION;  Surgeon: Jerrell Belfast, MD;  Location: [place];  Service: ENT;  Laterality: Bilateral;   NASAL SEPTOPLASTY W/ TURBINOPLASTY Bilateral 05/18/2018   Procedure: NASAL SEPTOPLASTY WITH TURBINATE REDUCTION;  Surgeon: Jerrell Belfast, MD;  Location: [place];  Service: ENT;  Laterality: Bilateral;   ORIF FOREARM FRACTURE     right   plastic surgery to face     SINUS ENDO W/FUSION Bilateral 05/18/2018   Procedure: ENDOSCOPIC SINUS SURGERY WITH NAVIGATION;  Surgeon: Jerrell Belfast, MD;  Location: [place];  Service: ENT;  Laterality: Bilateral;   VIDEO BRONCHOSCOPY N/A 09/24/2019   Procedure: VIDEO BRONCHOSCOPY WITHOUT FLUORO;  Surgeon: Candee Furbish, MD;  Location: [place];  Service: Pulmonary;  Laterality: N/A;    Family History  Problem Relation Age of Onset   Diabetes Mother    Hypertension Mother    Cancer Paternal Uncle     Social History   Tobacco Use   Smoking status: Former    Packs/day: 0.20    Years: 20.00    Pack years: 4.00    Types: Cigars, Cigarettes    Quit date: 2014  Years since quitting: 8.9   Smokeless tobacco: Never  Vaping Use   Vaping Use: Never used  Substance Use Topics   Alcohol use: Not Currently    Comment: 2 40oz beers a week   Drug use: No    Prior to Admission medications   Medication Sig Start Date End Date Taking? Authorizing Provider  aspirin EC 81 MG tablet Take 81 mg by mouth daily.   Yes [provider]  atorvastatin (LIPITOR) 20 MG tablet Take 1 tablet (20 mg total) by mouth daily. 09/27/19  Yes Angiulli, Lavon Paganini, PA-C  clopidogrel (PLAVIX) 75 MG tablet Take 1 tablet (75 mg total) by mouth daily. 09/27/19  Yes Angiulli, Lavon Paganini, PA-C  diazepam (VALIUM) 5 MG tablet Take 0.5 tablets (2.5 mg total)  by mouth every 8 (eight) hours as needed (For vertigo; may cause drowsiness). 07/29/21  Yes Chosen Geske, MD  methylphenidate (RITALIN) 5 MG tablet Take 1 tablet (5 mg total) by mouth daily. Patient taking differently: Take 5 mg by mouth daily as needed (to help focus). 09/27/19  Yes Angiulli, Lavon Paganini, PA-C  molnupiravir EUA (LAGEVRIO) 200 mg CAPS capsule Take 4 capsules (800 mg total) by mouth 2 (two) times daily for 5 days. 07/29/21 08/03/21 Yes Aracelly Tencza, MD  ondansetron (ZOFRAN-ODT) 8 MG disintegrating tablet Take 1 tablet (8 mg total) by mouth every 8 (eight) hours as needed for nausea or vomiting. 8mg  ODT q4 hours prn nausea 07/29/21  Yes Dimitra Woodstock, MD  oxyCODONE-acetaminophen (PERCOCET/ROXICET) 5-325 MG tablet Take 2 tablets by mouth every 8 (eight) hours as needed (for pain). 12/12/19  Yes Lavina Hamman, MD  pantoprazole (PROTONIX) 40 MG tablet Take 1 tablet (40 mg total) by mouth at bedtime. 09/27/19  Yes Angiulli, Lavon Paganini, PA-C  valACYclovir (VALTREX) 500 MG tablet Take 1 tablet (500 mg total) by mouth 2 (two) times daily. Start after completing active treatment dose of Valtrex for herpes zoster of face/eye. 05/06/21  Yes Brunetta Genera, MD  cyclobenzaprine (FLEXERIL) 10 MG tablet Take 10 mg by mouth at bedtime. 01/13/20   [provider]    Allergies Lisinopril and Tuberculin tests   REVIEW OF SYSTEMS  Negative except as noted here or in the History of Present Illness.   PHYSICAL EXAMINATION  Initial Vital Signs Blood pressure (!) 152/77, pulse 98, temperature 98 F (36.7 C), temperature source Oral, resp. rate 19, SpO2 96 %.  Examination General: Well-developed, well-nourished male in no acute distress; appearance consistent with age of record HENT: normocephalic; atraumatic Eyes: pupils equal, round and reactive to light; extraocular muscles intact; left conjunctival lesion; left ptosis Neck: supple Heart: regular rate and rhythm Lungs: clear to auscultation  bilaterally Abdomen: soft; nondistended; nontender; bowel sounds present Extremities: No deformity; full range of motion; pulses normal Neurologic: Awake, alert and oriented x 2; left hemiplegia Skin: Warm and dry; hyperpigmented lesions of left forehead and periorbital area Psychiatric: Flat affect   RESULTS  Summary of this visit's results, reviewed and interpreted by myself:   EKG Interpretation  Date/Time:    Ventricular Rate:    PR Interval:    QRS Duration:   QT Interval:    QTC Calculation:   R Axis:     Text Interpretation:         Laboratory Studies: Results for orders placed or performed during the hospital encounter of 07/29/21 (from the past 24 hour(s))  Resp Panel by RT-PCR (Flu A&B, Covid) Nasopharyngeal Swab     Status:  Abnormal   Collection Time: 07/29/21  4:15 AM   Specimen: Nasopharyngeal Swab; Nasopharyngeal(NP) swabs in vial transport medium  Result Value Ref Range   SARS Coronavirus 2 by RT PCR POSITIVE (A) NEGATIVE   Influenza A by PCR NEGATIVE NEGATIVE   Influenza B by PCR NEGATIVE NEGATIVE  Lipase, blood     Status: None   Collection Time: 07/29/21  5:10 AM  Result Value Ref Range   Lipase 21 11 - 51 U/L  Comprehensive metabolic panel     Status: Abnormal   Collection Time: 07/29/21  5:10 AM  Result Value Ref Range   Sodium 138 135 - 145 mmol/L   Potassium 3.4 (L) 3.5 - 5.1 mmol/L   Chloride 106 98 - 111 mmol/L   CO2 21 (L) 22 - 32 mmol/L   Glucose, Bld 249 (H) 70 - 99 mg/dL   BUN 10 8 - 23 mg/dL   Creatinine, Ser 0.80 0.61 - 1.24 mg/dL   Calcium 8.5 (L) 8.9 - 10.3 mg/dL   Total Protein 6.9 6.5 - 8.1 g/dL   Albumin 4.1 3.5 - 5.0 g/dL   AST 16 15 - 41 U/L   ALT 13 0 - 44 U/L   Alkaline Phosphatase 88 38 - 126 U/L   Total Bilirubin 1.6 (H) 0.3 - 1.2 mg/dL   GFR, Estimated >60 >60 mL/min   Anion gap 11 5 - 15  CBC     Status: Abnormal   Collection Time: 07/29/21  5:10 AM  Result Value Ref Range   WBC 45.4 (H) 4.0 - 10.5 K/uL   RBC 3.14  (L) 4.22 - 5.81 MIL/uL   Hemoglobin 9.4 (L) 13.0 - 17.0 g/dL   HCT 28.4 (L) 39.0 - 52.0 %   MCV 90.4 80.0 - 100.0 fL   MCH 29.9 26.0 - 34.0 pg   MCHC 33.1 30.0 - 36.0 g/dL   RDW 18.0 (H) 11.5 - 15.5 %   Platelets 166 150 - 400 K/uL   nRBC 0.0 0.0 - 0.2 %  Differential     Status: Abnormal   Collection Time: 07/29/21  5:10 AM  Result Value Ref Range   Neutrophils Relative % 11 %   Neutro Abs 4.7 1.7 - 7.7 K/uL   Lymphocytes Relative 70 %   Lymphs Abs 31.0 (H) 0.7 - 4.0 K/uL   Monocytes Relative 19 %   Monocytes Absolute 8.6 (H) 0.1 - 1.0 K/uL   Eosinophils Relative 0 %   Eosinophils Absolute 0.0 0.0 - 0.5 K/uL   Basophils Relative 0 %   Basophils Absolute 0.1 0.0 - 0.1 K/uL   WBC Morphology ABSOLUTE LYMPHOCYTOSIS    Immature Granulocytes 0 %   Abs Immature Granulocytes 0.07 0.00 - 0.07 K/uL  CBG monitoring, ED     Status: Abnormal   Collection Time: 07/29/21  5:41 AM  Result Value Ref Range   Glucose-Capillary 231 (H) 70 - 99 mg/dL   Imaging Studies: No results found.  ED COURSE and MDM  Nursing notes, initial and subsequent vitals signs, including pulse oximetry, reviewed and interpreted by myself.  Vitals:   07/29/21 0429 07/29/21 0530 07/29/21 0630  BP: (!) 152/77 136/80 (!) 147/86  Pulse: 98 89 88  Resp: 19 (!) 21 (!) 36  Temp: 98 F (36.7 C)    TempSrc: Oral    SpO2: 96% 96% 97%   Medications  ondansetron (ZOFRAN) injection 4 mg (4 mg Intravenous Not Given 07/29/21 0637)  ondansetron (ZOFRAN-ODT) disintegrating tablet 4  mg (4 mg Oral Given 07/29/21 0514)  diazepam (VALIUM) injection 2.5 mg (2.5 mg Intravenous Given 07/29/21 0628)   6:42 AM Vertigo relieved with IV Valium.  Patient's COVID test is positive.  We will start on oral antiviral therapy given his baseline medical problems.  He does not appear to need inpatient treatment at this time as he is dyspneic or hypoxic.   PROCEDURES  Procedures   ED DIAGNOSES     ICD-10-CM   1. Vertigo  R42     2.  COVID-19 virus infection  U07.1          Sharin Altidor, Jenny Reichmann, MD 07/29/21 [phone]

## 2021-07-29 NOTE — ED Notes (Signed)
Called Lab to add on differential

## 2021-07-29 NOTE — ED Triage Notes (Addendum)
Pt BIB EMS with complaints of nausea, vomiting, and diarrhea x 1 month intermittently. Pt has a hx of lymphoma. Pt has left sided weakness from a stroke.

## 2021-07-29 NOTE — ED Notes (Signed)
Transportation called at 0700

## 2021-09-22 ENCOUNTER — Encounter: Payer: Self-pay | Admitting: Hematology

## 2021-09-22 ENCOUNTER — Emergency Department (HOSPITAL_COMMUNITY)
Admission: EM | Admit: 2021-09-22 | Discharge: 2021-09-23 | Disposition: A | Discharge status: Home / Self Care | Payer: Medicare HMO | Attending: Emergency Medicine | Admitting: Emergency Medicine

## 2021-09-22 ENCOUNTER — Emergency Department (HOSPITAL_COMMUNITY): Payer: Medicare HMO

## 2021-09-22 ENCOUNTER — Other Ambulatory Visit: Payer: Self-pay

## 2021-09-22 DIAGNOSIS — Z7902 Long term (current) use of antithrombotics/antiplatelets: Secondary | ICD-10-CM | POA: Diagnosis not present

## 2021-09-22 DIAGNOSIS — R112 Nausea with vomiting, unspecified: Secondary | ICD-10-CM | POA: Insufficient documentation

## 2021-09-22 DIAGNOSIS — I10 Essential (primary) hypertension: Secondary | ICD-10-CM | POA: Insufficient documentation

## 2021-09-22 DIAGNOSIS — E119 Type 2 diabetes mellitus without complications: Secondary | ICD-10-CM | POA: Diagnosis not present

## 2021-09-22 DIAGNOSIS — Z7982 Long term (current) use of aspirin: Secondary | ICD-10-CM | POA: Diagnosis not present

## 2021-09-22 DIAGNOSIS — R791 Abnormal coagulation profile: Secondary | ICD-10-CM | POA: Insufficient documentation

## 2021-09-22 DIAGNOSIS — R42 Dizziness and giddiness: Secondary | ICD-10-CM

## 2021-09-22 DIAGNOSIS — R9431 Abnormal electrocardiogram [ECG] [EKG]: Secondary | ICD-10-CM | POA: Insufficient documentation

## 2021-09-22 DIAGNOSIS — Z20822 Contact with and (suspected) exposure to covid-19: Secondary | ICD-10-CM | POA: Diagnosis not present

## 2021-09-22 DIAGNOSIS — Z79899 Other long term (current) drug therapy: Secondary | ICD-10-CM | POA: Diagnosis not present

## 2021-09-22 LAB — COMPREHENSIVE METABOLIC PANEL
ALT: 15 U/L (ref 0–44)
AST: 20 U/L (ref 15–41)
Albumin: 4.1 g/dL (ref 3.5–5.0)
Alkaline Phosphatase: 95 U/L (ref 38–126)
Anion gap: 10 (ref 5–15)
BUN: 7 mg/dL — ABNORMAL LOW (ref 8–23)
CO2: 22 mmol/L (ref 22–32)
Calcium: 8.9 mg/dL (ref 8.9–10.3)
Chloride: 106 mmol/L (ref 98–111)
Creatinine, Ser: 0.92 mg/dL (ref 0.61–1.24)
GFR, Estimated: >60 mL/min (ref >60)
Glucose, Bld: 191 mg/dL — ABNORMAL HIGH (ref 70–99)
Potassium: 3.8 mmol/L (ref 3.5–5.1)
Sodium: 138 mmol/L (ref 135–145)
Total Bilirubin: 1.8 mg/dL — ABNORMAL HIGH (ref 0.3–1.2)
Total Protein: 6.8 g/dL (ref 6.5–8.1)

## 2021-09-22 LAB — CBG MONITORING, ED: Glucose-Capillary: 182 mg/dL — ABNORMAL HIGH (ref 70–99)

## 2021-09-22 LAB — DIFFERENTIAL
Abs Immature Granulocytes: 0 10*3/uL (ref 0.00–0.07)
Basophils Absolute: 0 10*3/uL (ref 0.0–0.1)
Basophils Relative: 0 %
Eosinophils Absolute: 0 10*3/uL (ref 0.0–0.5)
Eosinophils Relative: 0 %
Lymphocytes Relative: 89 %
Lymphs Abs: 55.4 10*3/uL — ABNORMAL HIGH (ref 0.7–4.0)
Monocytes Absolute: 0.6 10*3/uL (ref 0.1–1.0)
Monocytes Relative: 1 %
Neutro Abs: 6.2 10*3/uL (ref 1.7–7.7)
Neutrophils Relative %: 10 %
nRBC: 1 /100 WBC — ABNORMAL HIGH

## 2021-09-22 LAB — PROTIME-INR
INR: 1 (ref 0.8–1.2)
Prothrombin Time: 13.6 seconds (ref 11.4–15.2)

## 2021-09-22 LAB — CBC
HCT: 27.6 % — ABNORMAL LOW (ref 39.0–52.0)
Hemoglobin: 9.1 g/dL — ABNORMAL LOW (ref 13.0–17.0)
MCH: 31.7 pg (ref 26.0–34.0)
MCHC: 33 g/dL (ref 30.0–36.0)
MCV: 96.2 fL (ref 80.0–100.0)
Platelets: 168 10*3/uL (ref 150–400)
RBC: 2.87 MIL/uL — ABNORMAL LOW (ref 4.22–5.81)
RDW: 15.4 % (ref 11.5–15.5)
WBC: 62.3 10*3/uL (ref 4.0–10.5)
nRBC: 0 % (ref 0.0–0.2)

## 2021-09-22 LAB — ETHANOL: Alcohol, Ethyl (B): <10 mg/dL (ref <10)

## 2021-09-22 LAB — APTT: aPTT: 29 seconds (ref 24–36)

## 2021-09-22 MED ORDER — MECLIZINE HCL 25 MG PO TABS
25.0000 mg | ORAL_TABLET | Freq: Once | ORAL | Status: AC
  Administered 2021-09-22: 25 mg via ORAL
  Filled 2021-09-22: qty 1

## 2021-09-22 MED ORDER — LORAZEPAM 2 MG/ML IJ SOLN
1.0000 mg | Freq: Once | INTRAMUSCULAR | Status: AC
  Administered 2021-09-23: 1 mg via INTRAVENOUS
  Filled 2021-09-22: qty 1

## 2021-09-22 MED ORDER — SODIUM CHLORIDE 0.9 % IV BOLUS
1000.0000 mL | Freq: Once | INTRAVENOUS | Status: AC
  Administered 2021-09-23: 1000 mL via INTRAVENOUS

## 2021-09-22 NOTE — ED Triage Notes (Signed)
Pt bib [facility] after fall. Pt states he lowered himself to ground and did not hit head. No LOC. Pt is on blood thinners. Pt c/o dizziness and n/v on arrival. Hx of vertigo. Left sided deficit from previous stroke.   BP 138/77 (109), HR 84

## 2021-09-22 NOTE — ED Provider Notes (Signed)
[place] EMERGENCY DEPARTMENT Provider Note   CSN: [id number] Arrival date & time: 09/22/21  2140     History  Chief Complaint  Patient presents with   Fall   Dizziness    [name] [name] is a 66 y.o. male with PMHx HTN, Diabetes, CLL, and CVA with residual left sided hemiplegia who presents to the ED today via EMS for fall s/2 room spinning dizziness.  Patient reports his symptoms started around 2 PM today.  He associates her spinning dizziness, nausea, vomiting.  He reports history of same proximately 9 weeks ago.  Does not take anything specifically for vertigo.  States that he tried to get up and fell due to the dizziness.  He was able to catch himself on his bilateral hands and knees.  He has no complaints from the fall.  He does report history of stroke in the past with left-sided hemiplegia.  Patient states that he feels like he had similar symptoms with a previous stroke.  Per chart review patient presented for stroke in 2021 for left-sided weakness and slurred speech.   The history is provided by the patient and medical records.      Home Medications Prior to Admission medications   Medication Sig Start Date End Date Taking? Authorizing Provider  aspirin EC 81 MG tablet Take 81 mg by mouth daily.    [provider]  atorvastatin (LIPITOR) 20 MG tablet Take 1 tablet (20 mg total) by mouth daily. 09/27/19   Angiulli, Lavon Paganini, PA-C  clopidogrel (PLAVIX) 75 MG tablet Take 1 tablet (75 mg total) by mouth daily. 09/27/19   Angiulli, Lavon Paganini, PA-C  cyclobenzaprine (FLEXERIL) 10 MG tablet Take 10 mg by mouth at bedtime. 01/13/20   [provider]  diazepam (VALIUM) 5 MG tablet Take 0.5 tablets (2.5 mg total) by mouth every 8 (eight) hours as needed (For vertigo; may cause drowsiness). 07/29/21   Molpus, John, MD  methylphenidate (RITALIN) 5 MG tablet Take 1 tablet (5 mg total) by mouth daily. Patient taking differently: Take 5 mg by mouth daily as  needed (to help focus). 09/27/19   Angiulli, Lavon Paganini, PA-C  ondansetron (ZOFRAN-ODT) 8 MG disintegrating tablet Take 1 tablet (8 mg total) by mouth every 8 (eight) hours as needed for nausea or vomiting. 8mg  ODT q4 hours prn nausea 07/29/21   Molpus, John, MD  oxyCODONE-acetaminophen (PERCOCET/ROXICET) 5-325 MG tablet Take 2 tablets by mouth every 8 (eight) hours as needed (for pain). 12/12/19   Lavina Hamman, MD  pantoprazole (PROTONIX) 40 MG tablet Take 1 tablet (40 mg total) by mouth at bedtime. 09/27/19   Angiulli, Lavon Paganini, PA-C  valACYclovir (VALTREX) 500 MG tablet Take 1 tablet (500 mg total) by mouth 2 (two) times daily. Start after completing active treatment dose of Valtrex for herpes zoster of face/eye. 05/06/21   Brunetta Genera, MD      Allergies    Lisinopril and Tuberculin tests    Review of Systems   Review of Systems  Constitutional:  Negative for fever.  Gastrointestinal:  Positive for nausea and vomiting.  Neurological:  Positive for dizziness. Negative for syncope, speech difficulty, weakness and numbness.  All other systems reviewed and are negative.  Physical Exam Updated Vital Signs BP 140/82    Pulse 90    Temp 97.6 F (36.4 C) (Oral)    Resp (!) 44    Ht 5\' 7"  (1.702 m)    Wt 88.5 kg  SpO2 96%    BMI 30.54 kg/m  Physical Exam Vitals and nursing note reviewed.  Constitutional:      Appearance: He is not ill-appearing or diaphoretic.  HENT:     Head: Normocephalic and atraumatic.  Eyes:     Extraocular Movements: Extraocular movements intact.     Conjunctiva/sclera: Conjunctivae normal.     Pupils: Pupils are equal, round, and reactive to light.  Cardiovascular:     Rate and Rhythm: Normal rate and regular rhythm.  Pulmonary:     Effort: Pulmonary effort is normal.     Breath sounds: Normal breath sounds.  Abdominal:     Palpations: Abdomen is soft.     Tenderness: There is no abdominal tenderness.  Musculoskeletal:     Cervical back: Neck supple.   Skin:    General: Skin is warm and dry.  Neurological:     Mental Status: He is alert.     Comments: Residual left sided hemiplegia (unchanged from baseline per patient).  CN 2-12 intact.  Strength 5/5 to RUE/RLE. Sensation intact to this side. No pronator drift to right side.  Alert and oriented x 4.     ED Results / Procedures / Treatments   Labs (all labs ordered are listed, but only abnormal results are displayed) Labs Reviewed  CBC - Abnormal; Notable for the following components:      Result Value   WBC 62.3 (*)    RBC 2.87 (*)    Hemoglobin 9.1 (*)    HCT 27.6 (*)    All other components within normal limits  CBG MONITORING, ED - Abnormal; Notable for the following components:   Glucose-Capillary 182 (*)    All other components within normal limits  RESP PANEL BY RT-PCR (FLU A&B, COVID) ARPGX2  ETHANOL  PROTIME-INR  APTT  DIFFERENTIAL  COMPREHENSIVE METABOLIC PANEL  RAPID URINE DRUG SCREEN, HOSP PERFORMED  URINALYSIS, ROUTINE W REFLEX MICROSCOPIC    EKG None  Radiology CT HEAD WO CONTRAST  Result Date: 09/22/2021 CLINICAL DATA:  Dizziness, nausea, vomiting. EXAM: CT HEAD WITHOUT CONTRAST TECHNIQUE: Contiguous axial images were obtained from the base of the skull through the vertex without intravenous contrast. RADIATION DOSE REDUCTION: This exam was performed according to the departmental dose-optimization program which includes automated exposure control, adjustment of the mA and/or kV according to patient size and/or use of iterative reconstruction technique. COMPARISON:  12/09/2019, 05/27/2021. FINDINGS: Brain: No acute intracranial hemorrhage, midline shift or mass effect. No extra-axial fluid collection. Gray-white matter differentiation is within normal limits. No hydrocephalus. An old infarct is present in the basal ganglia on the right. Vascular: No hyperdense vessel or unexpected calcification. Skull: No acute fracture. Fixation hardware is noted in the  lateral wall of the left orbit and zygomatic process. Sinuses/Orbits: Mild mucosal thickening is present in the ethmoid air cells, sphenoid sinuses and maxillary sinuses bilaterally. Other: Fat stranding is present along the temporal region, masticator muscles and parotid gland on the left. Hyperdense lesions are noted in the parotid glands bilaterally measuring up to 1.9 cm, possible lymphadenopathy. There are also enlarged lymph nodes in the posterior cervical triangle on the left and posterior cervical soft tissues bilaterally. IMPRESSION: 1. No acute intracranial process. The need for further evaluation with MRI should be determined clinically. 2. Old lacunar infarct in the basal ganglia on the right. 3. Subcutaneous fat stranding over the temporal region, parotid gland, and masticator muscles on the left. Hyperdense lesions in the parotid glands bilaterally, likely lymphadenopathy. 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 Electronically Signed   By: Brett Fairy M.D.   On: 09/22/2021 23:16    Procedures Procedures    Medications Ordered in ED Medications  LORazepam (ATIVAN) injection 1 mg (has no administration in time range)  sodium chloride 0.9 % bolus 1,000 mL (has no administration in time range)  meclizine (ANTIVERT) tablet 25 mg (25 mg Oral Given 09/22/21 2248)    ED Course/ Medical Decision Making/ A&P                           Medical Decision Making 66 year old male who presents to the ED today via EMS for fall secondary to room spinning dizziness.  Also complaining of nausea and vomiting.  Reports history of same.  Recent ED visit for vertigo 07/29/21 - treated with valium and discharged. On arrival to the ED VSS. Pt actively dry heaving and has vomit on his face. He has hx of left sided hemiplegia from previous stroke in 2021.  He states this is unchanged. No other obvious focal neuro deficits. LKN prior to 2 PM today. He is LVO negative on exam. Will provide meclizine and reevaluate however given hx of stroke will workup for same as well as pt could be exhibiting signs of posterior stroke. Does not appear he had a CT scan during previous ED visit for vertigo.   On reevaluation after meclizine pt reports no improvement in symptoms. Will provide ativan and fluids and reevaluate. CT head negative however given hx of stroke will obtain MRI Brain.   Amount and/or Complexity of Data Reviewed Labs: ordered.    Details: CBC with WBC 62,300. Hx CLL. Differential pending at this time. Radiology: ordered.    Details: CT head negative ECG/medicine tests: ordered.    Details: EKG with prolonged QTc 513  Risk Prescription drug management.   At shift change case signed out to [facility], PA-C, who will follow up on labs/MRI.         Final Clinical Impression(s) / ED Diagnoses Final diagnoses:  Dizziness  Prolonged Q-T interval on ECG    Rx / DC Orders ED Discharge Orders     None         Eustaquio Maize, PA-C 09/22/21 2343    Davonna Belling, MD 09/23/21 1426

## 2021-09-22 NOTE — ED Provider Notes (Signed)
°  Care of patient assumed from [place] at 2341.  Agree with history, physical exam and plan.  See their note for further details. Briefly, 66 year old male with a history of CLL, CVA with residual left-sided hemiplegia, diabetes, and hypertension.  Presents with a chief complaint of room spinning dizziness.  Patient endorses associated nausea and vomiting.  Reports similar history approximately 9 weeks prior.  Patient reports that he had a fall due to his dizziness.     Physical Exam  BP 140/82    Pulse 90    Temp 97.6 F (36.4 C) (Oral)    Resp (!) 44    Ht 5\' 7"  (1.702 m)    Wt 88.5 kg    SpO2 96%    BMI 30.54 kg/m   Physical Exam Vitals and nursing note reviewed.  Constitutional:      General: He is sleeping. He is not in acute distress.    Appearance: He is not ill-appearing, toxic-appearing or diaphoretic.  HENT:     Head: Normocephalic.  Eyes:     General: No scleral icterus.       Right eye: No discharge.        Left eye: No discharge.  Cardiovascular:     Rate and Rhythm: Normal rate.  Pulmonary:     Effort: Pulmonary effort is normal.  Skin:    General: Skin is warm and dry.  Neurological:     General: No focal deficit present.     Mental Status: He is oriented to person, place, and time and easily aroused.     GCS: GCS eye subscore is 3. GCS verbal subscore is 5. GCS motor subscore is 6.     Cranial Nerves: Cranial nerves 2-12 are intact. No cranial nerve deficit, dysarthria or facial asymmetry.     Comments: Residual left-sided hemiplegia (unchanged from baseline per patient)  Psychiatric:        Behavior: Behavior is cooperative.    Procedures  Procedures  ED Course / MDM    Medical Decision Making Amount and/or Complexity of Data Reviewed Labs: ordered. Radiology: ordered.  Risk Prescription drug management.   At time of handoff lab work and MRI pending.  Patient had received meclizine with no improvement in his dizziness.  Patient was given Ativan for  nausea, other antiemetics were not used due to patient's prolonged QTC.  On my assessment patient is sleeping comfortably in no acute distress, he is easily aroused.  Patient reports that his dizziness and nausea have resolved.  CBC shows leukocytosis at 62.3, previously elevated at 45.4 1 month prior.  Suspect that this is increased due to his CLL.  Differential shows increased lymphocyte number, this has been elevated previously.  Smudge cells and teardrop cells cells are also present on differential.  Patient is currently followed by hematology oncology.  MRI shows no acute intracranial process.  No evidence of acute infarct.  Diffuse lymphadenopathy is noted and consistent with patient's known lymphoma.  Patient has continued resolution of symptoms.  Will discharge patient to follow-up with PCP and hematology/oncology in outpatient setting.  Discussed results, findings, treatment and follow up. Patient advised of return precautions. Patient verbalized understanding and agreed with plan.      Loni Beckwith, PA-C 09/23/21 0255    Maudie Flakes, MD 09/23/21 [phone]

## 2021-09-22 NOTE — ED Notes (Signed)
Lab called to reports WBC of 62.3.  EDP notified.

## 2021-09-23 LAB — URINALYSIS, ROUTINE W REFLEX MICROSCOPIC
Bacteria, UA: NONE SEEN
Bilirubin Urine: NEGATIVE
Glucose, UA: NEGATIVE mg/dL
Hgb urine dipstick: NEGATIVE
Ketones, ur: NEGATIVE mg/dL
Leukocytes,Ua: NEGATIVE
Nitrite: NEGATIVE
Protein, ur: NEGATIVE mg/dL
Specific Gravity, Urine: 1.02 (ref 1.005–1.030)
pH: 6.5 (ref 5.0–8.0)

## 2021-09-23 LAB — RAPID URINE DRUG SCREEN, HOSP PERFORMED
Amphetamines: NOT DETECTED
Barbiturates: NOT DETECTED
Benzodiazepines: NOT DETECTED
Cocaine: NOT DETECTED
Opiates: NOT DETECTED
Tetrahydrocannabinol: NOT DETECTED

## 2021-09-23 LAB — RESP PANEL BY RT-PCR (FLU A&B, COVID) ARPGX2
Influenza A by PCR: NEGATIVE
Influenza B by PCR: NEGATIVE
SARS Coronavirus 2 by RT PCR: NEGATIVE

## 2021-09-23 NOTE — Discharge Instructions (Addendum)
You came to the emergency department today to be evaluated for your dizziness.  The MRI of your head showed no acute abnormalities, it did show swollen lymph nodes consistent with your history of lymphoma.  Your lab work showed that your white blood cell count was elevated, this is likely due to your known cancer.  It is very important that you follow-up with your oncologist.  Please call your oncologist later today to schedule a follow-up appointment.  Get help right away if: You vomit or have diarrhea and are unable to eat or drink anything. You have problems talking, walking, swallowing, or using your arms, hands, or legs. You feel generally weak. You have any bleeding. You are not thinking clearly or you have trouble forming sentences. It may take a friend or family member to notice this. You have chest pain, abdominal pain, shortness of breath, or sweating. Your vision changes or you develop a severe headache.

## 2021-09-23 NOTE — ED Notes (Signed)
Patient's son states he will be here in 15-20 minutes to pickup patient.

## 2021-09-24 LAB — PATHOLOGIST SMEAR REVIEW

## 2022-02-19 DEATH — deceased

## 2022-03-14 ENCOUNTER — Other Ambulatory Visit: Payer: Self-pay

## 2023-02-05 IMAGING — MR MR HEAD W/O CM
12 of 13 series · 44 of 48 positions shown · non-contrast
Comparison: 12/10/2019 MRI, correlation is made with 09/22/2021 CT
head and 05/27/2021 PET-CT

CLINICAL DATA: Dizziness, nausea, vomiting, concern for stroke

EXAM:
MRI HEAD WITHOUT CONTRAST
TECHNIQUE: Multiplanar, multiecho pulse sequences of the brain and surrounding
structures were obtained without intravenous contrast.

[Series 5: DWI · axial · 3.0mm · 0.92mm/px · z∈[-86,+64]mm · 8 of 108 slices shown (1 of 4)]
[im 1/108]
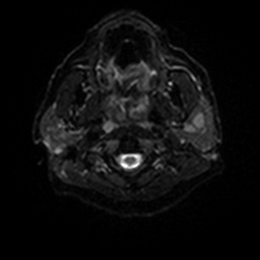
[im 16/108]
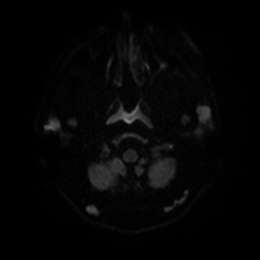
[im 31/108]
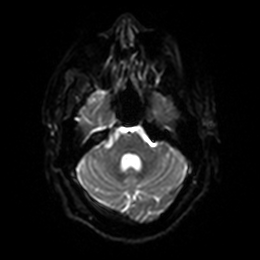
[im 46/108]
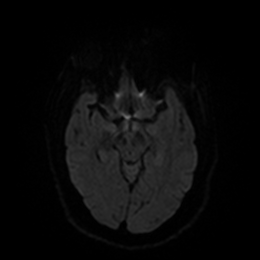
[im 62/108]
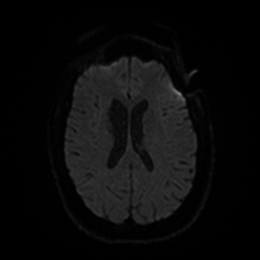
[im 77/108]
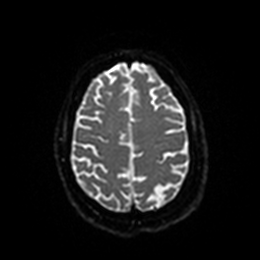
[im 92/108]
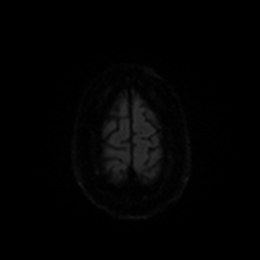
[im 108/108]
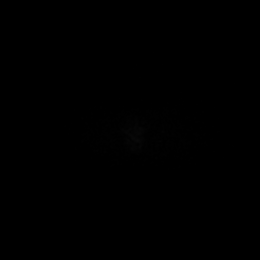

[Series 6: DWI · axial · 3.0mm · 0.92mm/px · z∈[-86,+64]mm · 4 of 54 slices shown (2 of 4)]
[im 1/54]
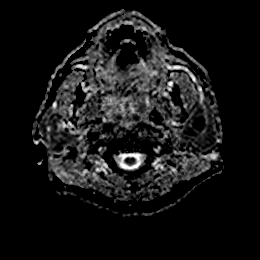
[im 18/54]
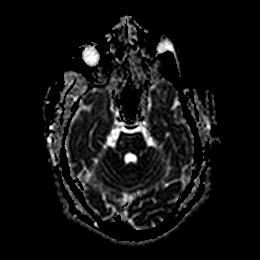
[im 36/54]
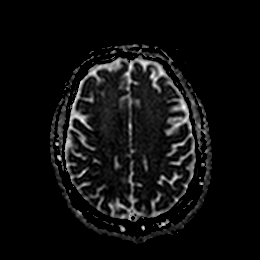
[im 54/54]
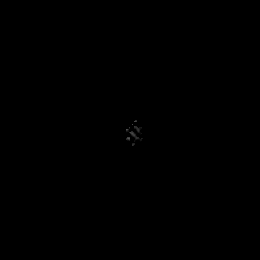

[Series 7: DWI · coronal · 4.0mm · 0.88mm/px · 6 of 82 slices shown (3 of 4)]
[im 1/82]
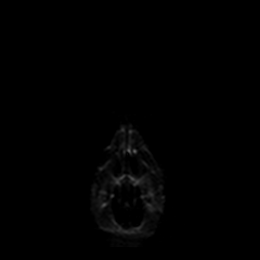
[im 17/82]
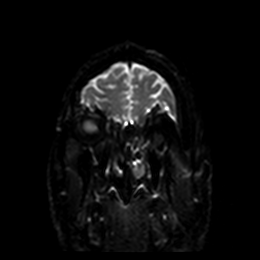
[im 33/82]
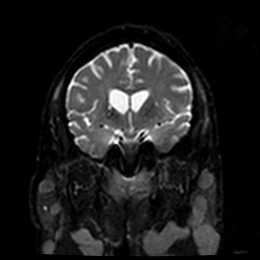
[im 49/82]
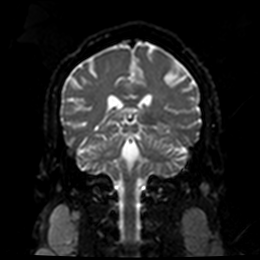
[im 65/82]
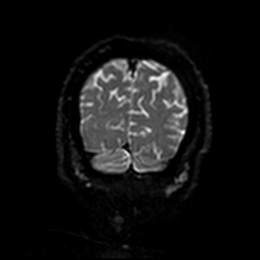
[im 82/82]
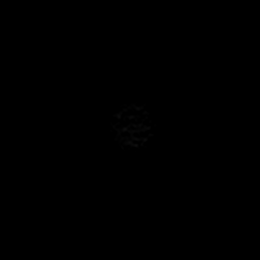

[Series 8: DWI · coronal · 4.0mm · 0.88mm/px · 3 of 41 slices shown (4 of 4)]
[im 1/41]
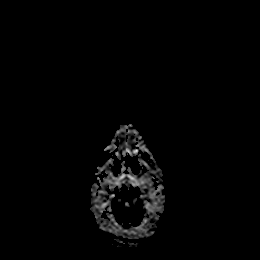
[im 21/41]
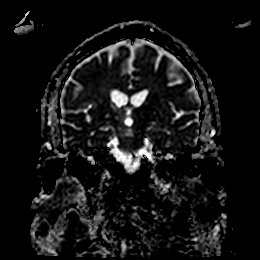
[im 41/41]
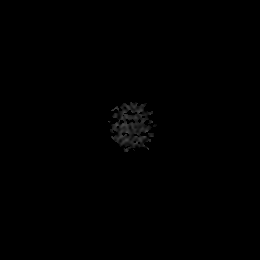

[Series 9: T1 · sagittal · 5.0mm · 0.78mm/px · 2 of 29 slices shown]
[im 1/29]
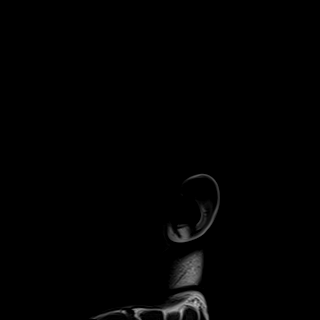
[im 29/29]
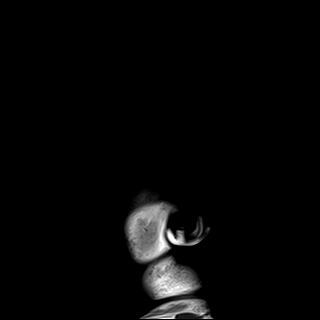

[Series 10: T2 · axial · 5.0mm · 0.75mm/px · z∈[-90,+61]mm · 2 of 28 slices shown (1 of 2)]
[im 1/28]
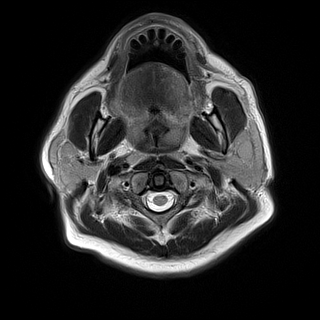
[im 28/28]
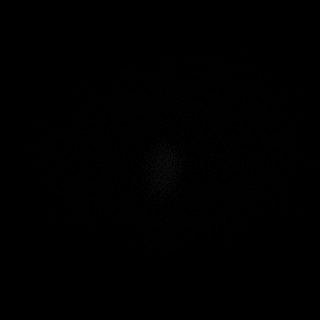

[Series 11: FLAIR · axial · 5.0mm · 0.47mm/px · z∈[-90,+61]mm · 2 of 28 slices shown]
[im 1/28]
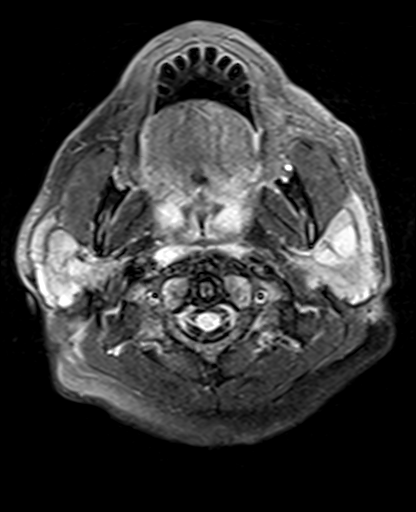
[im 28/28]
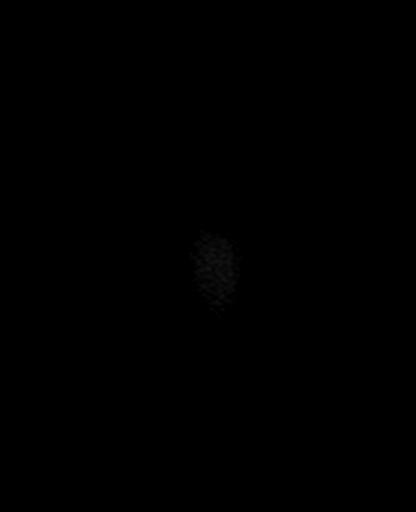

[Series 12: mag_images · axial · 3.0mm · 0.94mm/px · z∈[-91,+65]mm · 4 of 56 slices shown]
[im 1/56]
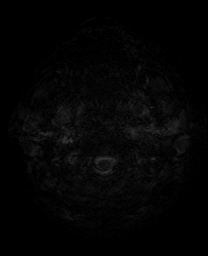
[im 19/56]
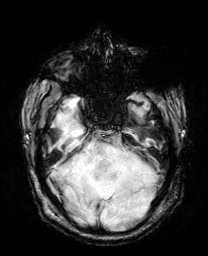
[im 37/56]
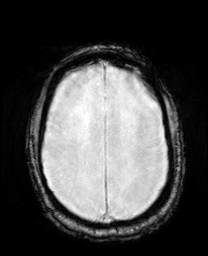
[im 56/56]
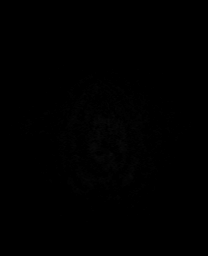

[Series 13: pha_images · axial · 3.0mm · 0.94mm/px · z∈[-91,+59]mm · 4 of 54 slices shown]
[im 1/54]
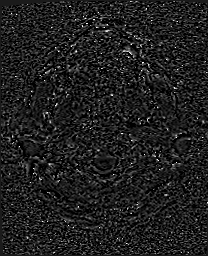
[im 18/54]
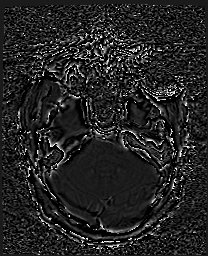
[im 36/54]
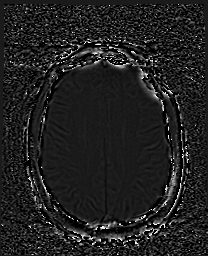
[im 54/54]
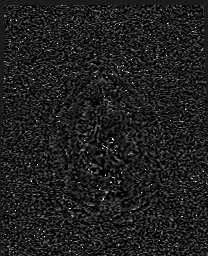

[Series 14: swi_images · axial · 3.0mm · 0.94mm/px · z∈[-91,+65]mm · 4 of 56 slices shown]
[im 1/56]
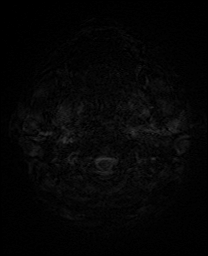
[im 19/56]
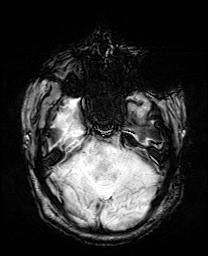
[im 37/56]
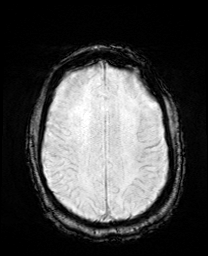
[im 56/56]
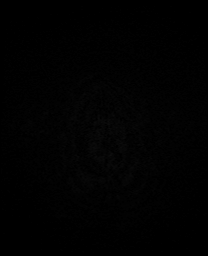

[Series 15: mip_images(sw) · axial · 24.0mm · 0.94mm/px · z∈[-81,+55]mm · 3 of 49 slices shown]
[im 1/49]
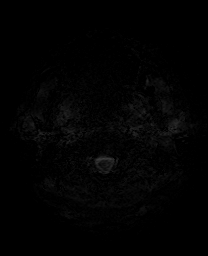
[im 25/49]
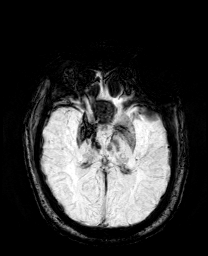
[im 49/49]
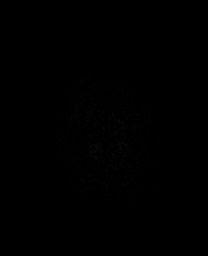

[Series 17: T2 · coronal · 5.0mm · 0.34mm/px · 2 of 35 slices shown (2 of 2)]
[im 1/35]
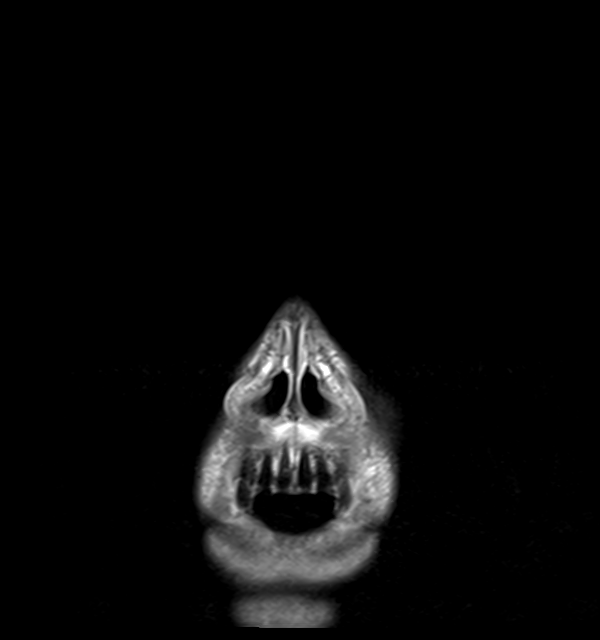
[im 35/35]
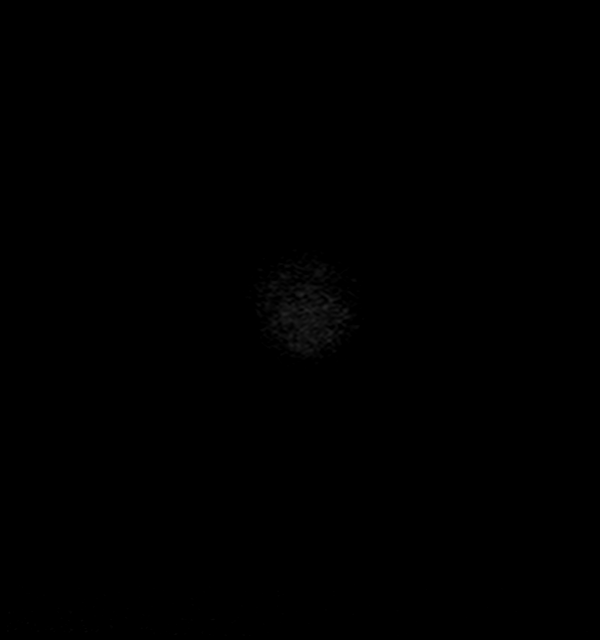

[44 of 48 positions shown; findings below may reference images not displayed]

FINDINGS: Brain: No restricted diffusion to suggest acute or subacute infarct.
No acute hemorrhage, mass, mass effect, or midline shift.
Encephalomalacia and hemosiderin deposition in the right basal
ganglia, consistent with the subacute infarcts seen on the
12/10/2019 MRI. No hydrocephalus or extra-axial collection. Mild T2
hyperintense signal in the periventricular white matter, likely the
sequela of chronic small vessel ischemic disease.

Vascular: Normal flow voids.

Skull and upper cervical spine: Normal marrow signal.

Sinuses/Orbits: Mild mucosal thickening in the maxillary sinuses.
The orbits are unremarkable.

Other: Trace fluid in the mastoid air cells. Diffuse
lymphadenopathy, including in the bilateral parotid glands,
posterior chain lymph nodes, and anterior chain lymph nodes, which
are best visualized on the sagittal T1 sequence (series 9, images 22
and 7). These are better evaluated on the 05/27/2021 PET-CT.
IMPRESSION: 1. No acute intracranial process.  No evidence of acute infarct.
2. Diffuse lymphadenopathy, including in the bilateral parotid
glands, posterior chain lymph nodes, and anterior chain lymph nodes
which are incompletely evaluated given the field of view of this
study. These are better evaluated on the 05/27/2021 PET-CT and
consistent with the patient's known lymphoma.
